# Patient Record
Sex: Male | Born: 1965 | Race: White | Hispanic: No | Marital: Single | Smoking: Former smoker
Health system: Southern US, Community
[De-identification: ages and names within clinical notes are randomized; demographics above are authoritative.]

## PROBLEM LIST (undated history)

## (undated) DIAGNOSIS — I10 Essential (primary) hypertension: Secondary | ICD-10-CM

## (undated) DIAGNOSIS — E119 Type 2 diabetes mellitus without complications: Secondary | ICD-10-CM

## (undated) DIAGNOSIS — E785 Hyperlipidemia, unspecified: Secondary | ICD-10-CM

## (undated) DIAGNOSIS — G4730 Sleep apnea, unspecified: Secondary | ICD-10-CM

## (undated) DIAGNOSIS — Z9889 Other specified postprocedural states: Secondary | ICD-10-CM

## (undated) DIAGNOSIS — R112 Nausea with vomiting, unspecified: Secondary | ICD-10-CM

## (undated) DIAGNOSIS — M4800 Spinal stenosis, site unspecified: Secondary | ICD-10-CM

## (undated) DIAGNOSIS — N289 Disorder of kidney and ureter, unspecified: Secondary | ICD-10-CM

## (undated) DIAGNOSIS — F319 Bipolar disorder, unspecified: Secondary | ICD-10-CM

## (undated) DIAGNOSIS — E279 Disorder of adrenal gland, unspecified: Secondary | ICD-10-CM

## (undated) DIAGNOSIS — T8859XA Other complications of anesthesia, initial encounter: Secondary | ICD-10-CM

## (undated) HISTORY — PX: TONSILLECTOMY: SUR1361

## (undated) HISTORY — DX: Hyperlipidemia, unspecified: E78.5

## (undated) HISTORY — DX: Disorder of kidney and ureter, unspecified: N28.9

## (undated) HISTORY — DX: Sleep apnea, unspecified: G47.30

## (undated) HISTORY — DX: Disorder of adrenal gland, unspecified: E27.9

## (undated) HISTORY — DX: Essential (primary) hypertension: I10

## (undated) HISTORY — DX: Bipolar disorder, unspecified: F31.9

---
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
## 2018-09-28 ENCOUNTER — Other Ambulatory Visit: Payer: Self-pay

## 2018-09-28 ENCOUNTER — Encounter: Payer: Self-pay | Admitting: Pulmonary Disease

## 2018-09-28 ENCOUNTER — Ambulatory Visit (INDEPENDENT_AMBULATORY_CARE_PROVIDER_SITE_OTHER): Payer: Medicare Other | Admitting: Pulmonary Disease

## 2018-09-28 VITALS — BP 146/80 | HR 101 | Ht 72.0 in | Wt 186.6 lb

## 2018-09-28 DIAGNOSIS — G40909 Epilepsy, unspecified, not intractable, without status epilepticus: Secondary | ICD-10-CM

## 2018-09-28 DIAGNOSIS — G4730 Sleep apnea, unspecified: Secondary | ICD-10-CM

## 2018-09-28 NOTE — Progress Notes (Signed)
Subjective:     Patient ID: Edward Garner, male   DOB: 10/27/1965, 53 y.o.   MRN: [id number]  Patient has been evaluated for possible sleep disordered breathing  History of severe snoring, teeth grinding, thrashing around at night  Kicking around and sleep  Noticed witnessed apneas Was born prematurely History of absence seizures, ADHD, diabetes, bipolar disorder  Usually goes to bed between 9 and 10 PM, falls asleep quickly with medications Wakes up about once or twice during the night Final awakening time about 6 AM  Multiple awakenings at night Significant thrashing around at night   Review of Systems  Constitutional: Negative for fever and unexpected weight change.  HENT: Positive for dental problem. Negative for congestion, ear pain, nosebleeds, postnasal drip, rhinorrhea, sinus pressure, sneezing, sore throat and trouble swallowing.   Eyes: Negative for redness and itching.  Respiratory: Negative for cough, chest tightness, shortness of breath and wheezing.   Cardiovascular: Negative for palpitations and leg swelling.  Gastrointestinal: Negative for nausea and vomiting.  Genitourinary: Negative for dysuria.  Musculoskeletal: Positive for joint swelling.  Skin: Negative for rash.  Allergic/Immunologic: Positive for environmental allergies. Negative for food allergies and immunocompromised state.  Neurological: Positive for headaches.  Hematological: Does not bruise/bleed easily.  Psychiatric/Behavioral: Negative for dysphoric mood. The patient is nervous/anxious.    Quit smoking in 1996  Family history of heart disease,  He is disabled  Personal history of hypertension Diabetes Hypercholesterolemia He has a lone kidney Seizure disorder    Objective:   Physical Exam Vitals signs reviewed.  Constitutional:      Appearance: Normal appearance.  Eyes:     Extraocular Movements: Extraocular movements intact.     Pupils: Pupils are equal, round, and reactive to  light.  Neck:     Musculoskeletal: Normal range of motion and neck supple. No neck rigidity.  Cardiovascular:     Rate and Rhythm: Normal rate and regular rhythm.     Heart sounds: No murmur. No friction rub.  Pulmonary:     Effort: Pulmonary effort is normal. No respiratory distress.     Breath sounds: Normal breath sounds. No stridor. No wheezing.  Abdominal:     General: Abdomen is flat.  Musculoskeletal: Normal range of motion.        General: No swelling or tenderness.  Skin:    General: Skin is warm.  Neurological:     Mental Status: He is alert.  Psychiatric:        Mood and Affect: Mood normal.    Results of the Epworth flowsheet 09/28/2018  Sitting and reading 2  Watching TV 1  Sitting, inactive in a public place (e.g. a theatre or a meeting) 1  As a passenger in a car for an hour without a break 2  Lying down to rest in the afternoon when circumstances permit 3  Sitting and talking to someone 0  Sitting quietly after a lunch without alcohol 3  In a car, while stopped for a few minutes in traffic 1  Total score 13      Assessment:     Moderate to high probability of significant sleep disordered breathing -Witnessed apneas -Excessive daytime sleepiness -Heavy snoring  REM behavior disorder -History of nonrestorative sleep -Kicking around a lot at night -Abnormal movements/behavior the patient relates to dreams  History of bipolar disorder/seizure disorder  Bruxism/teeth grinding -Likely related to his untreated obstructive sleep apnea    Plan:     Patient will be optimally evaluated  by an in lab study  Pathophysiology of sleep disordered breathing discussed with patient  Treatment options for sleep disordered breathing discussed  I did discuss the possibility of a home sleep study if patient feels he may not be able to sleep in the lab-this will be less optimal, however, will aid in a diagnosis of sleep disordered breathing-failure rate may be higher  than normal  I will see him back in the office in about 2 months  Encouraged to call with any significant concerns

## 2018-09-28 NOTE — Patient Instructions (Signed)
Moderate probability of significant sleep disordered breathing Possibility of REM behavior disorder Nonrestorative sleep  We will schedule you for an in lab polysomnogram A split-night study will be ordered-treatment on the same night if we get an adequate amount of study time  I will see you back in the office in about 2 to 3 months Sleep Apnea Sleep apnea is a condition in which breathing pauses or becomes shallow during sleep. Episodes of sleep apnea usually last 10 seconds or longer, and they may occur as many as 20 times an hour. Sleep apnea disrupts your sleep and keeps your body from getting the rest that it needs. This condition can increase your risk of certain health problems, including:  Heart attack.  Stroke.  Obesity.  Diabetes.  Heart failure.  Irregular heartbeat. What are the causes? There are three kinds of sleep apnea:  Obstructive sleep apnea. This kind is caused by a blocked or collapsed airway.  Central sleep apnea. This kind happens when the part of the brain that controls breathing does not send the correct signals to the muscles that control breathing.  Mixed sleep apnea. This is a combination of obstructive and central sleep apnea. The most common cause of this condition is a collapsed or blocked airway. An airway can collapse or become blocked if:  Your throat muscles are abnormally relaxed.  Your tongue and tonsils are larger than normal.  You are overweight.  Your airway is smaller than normal. What increases the risk? You are more likely to develop this condition if you:  Are overweight.  Smoke.  Have a smaller than normal airway.  Are elderly.  Are male.  Drink alcohol.  Take sedatives or tranquilizers.  Have a family history of sleep apnea. What are the signs or symptoms? Symptoms of this condition include:  Trouble staying asleep.  Daytime sleepiness and tiredness.  Irritability.  Loud snoring.  Morning headaches.   Trouble concentrating.  Forgetfulness.  Decreased interest in sex.  Unexplained sleepiness.  Mood swings.  Personality changes.  Feelings of depression.  Waking up often during the night to urinate.  Dry mouth.  Sore throat. How is this diagnosed? This condition may be diagnosed with:  A medical history.  A physical exam.  A series of tests that are done while you are sleeping (sleep study). These tests are usually done in a sleep lab, but they may also be done at home. How is this treated? Treatment for this condition aims to restore normal breathing and to ease symptoms during sleep. It may involve managing health issues that can affect breathing, such as high blood pressure or obesity. Treatment may include:  Sleeping on your side.  Using a decongestant if you have nasal congestion.  Avoiding the use of depressants, including alcohol, sedatives, and narcotics.  Losing weight if you are overweight.  Making changes to your diet.  Quitting smoking.  Using a device to open your airway while you sleep, such as: ? An oral appliance. This is a custom-made mouthpiece that shifts your lower jaw forward. ? A continuous positive airway pressure (CPAP) device. This device blows air through a mask when you breathe out (exhale). ? A nasal expiratory positive airway pressure (EPAP) device. This device has valves that you put into each nostril. ? A bi-level positive airway pressure (BPAP) device. This device blows air through a mask when you breathe in (inhale) and breathe out (exhale).  Having surgery if other treatments do not work. During surgery, excess tissue  is removed to create a wider airway. It is important to get treatment for sleep apnea. Without treatment, this condition can lead to:  High blood pressure.  Coronary artery disease.  In men, an inability to achieve or maintain an erection (impotence).  Reduced thinking abilities. Follow these instructions at  home: Lifestyle  Make any lifestyle changes that your health care provider recommends.  Eat a healthy, well-balanced diet.  Take steps to lose weight if you are overweight.  Avoid using depressants, including alcohol, sedatives, and narcotics.  Do not use any products that contain nicotine or tobacco, such as cigarettes, e-cigarettes, and chewing tobacco. If you need help quitting, ask your health care provider. General instructions  Take over-the-counter and prescription medicines only as told by your health care provider.  If you were given a device to open your airway while you sleep, use it only as told by your health care provider.  If you are having surgery, make sure to tell your health care provider you have sleep apnea. You may need to bring your device with you.  Keep all follow-up visits as told by your health care provider. This is important. Contact a health care provider if:  The device that you received to open your airway during sleep is uncomfortable or does not seem to be working.  Your symptoms do not improve.  Your symptoms get worse. Get help right away if:  You develop: ? Chest pain. ? Shortness of breath. ? Discomfort in your back, arms, or stomach.  You have: ? Trouble speaking. ? Weakness on one side of your body. ? Drooping in your face. These symptoms may represent a serious problem that is an emergency. Do not wait to see if the symptoms will go away. Get medical help right away. Call your local emergency services (911 in the [place] Do not drive yourself to the hospital. Summary  Sleep apnea is a condition in which breathing pauses or becomes shallow during sleep.  The most common cause is a collapsed or blocked airway.  The goal of treatment is to restore normal breathing and to ease symptoms during sleep. This information is not intended to replace advice given to you by your health care provider. Make sure you discuss any questions you  have with your health care provider. Document Released: 12/21/2001 Document Revised: 10/17/2017 Document Reviewed: 08/26/2017 Elsevier Patient Education  2020 [employer].

## 2018-09-30 ENCOUNTER — Telehealth: Payer: Self-pay | Admitting: Pulmonary Disease

## 2018-09-30 NOTE — Telephone Encounter (Signed)
Called and spoke with pt mother, Edward Garner (on [place]). [name] states pt needs to have his pre-procedure COVID test scheduled for 10/03/2018 at 1200 rescheduled for a later date. As I researched options, I noticed the day after, 10/04/2018 has no COVID test openings and the latest time available for 10/03/2018 is at 1240. [name] states pt will have to reschedule his split night study for 10/07/2018. I let her know I would get a message routed to our PCCs to help her get pt's sleep study rescheduled. [name] expressed understanding.   PCCs, can you help with this? Pt will need to reschedule his Split Night Study 10/07/2018 because he cannot make it to his pre-procedure COVID test. Thank you.

## 2018-10-01 NOTE — Telephone Encounter (Signed)
I called & provided [name] the phone # to the sleep center to call & reschedule sleep study &/or covid test as they are ones who schedule their covid tests.  Verbalized understanding & nothing further needed at this time.

## 2018-10-03 ENCOUNTER — Other Ambulatory Visit (HOSPITAL_COMMUNITY): Payer: Medicare Other

## 2018-10-06 ENCOUNTER — Other Ambulatory Visit (HOSPITAL_COMMUNITY)
Admission: RE | Admit: 2018-10-06 | Discharge: 2018-10-06 | Disposition: A | Discharge status: Home / Self Care | Payer: Medicare Other | Source: Ambulatory Visit | Attending: Pulmonary Disease | Admitting: Pulmonary Disease

## 2018-10-06 DIAGNOSIS — Z20828 Contact with and (suspected) exposure to other viral communicable diseases: Secondary | ICD-10-CM | POA: Insufficient documentation

## 2018-10-06 DIAGNOSIS — Z01812 Encounter for preprocedural laboratory examination: Secondary | ICD-10-CM | POA: Insufficient documentation

## 2018-10-07 ENCOUNTER — Encounter (HOSPITAL_BASED_OUTPATIENT_CLINIC_OR_DEPARTMENT_OTHER): Payer: Medicare Other | Admitting: Pulmonary Disease

## 2018-10-07 LAB — NOVEL CORONAVIRUS, NAA (HOSP ORDER, SEND-OUT TO REF LAB; TAT 18-24 HRS): SARS-CoV-2, NAA: NOT DETECTED

## 2018-10-09 ENCOUNTER — Encounter (HOSPITAL_BASED_OUTPATIENT_CLINIC_OR_DEPARTMENT_OTHER): Payer: Medicare Other | Admitting: Pulmonary Disease

## 2018-10-14 ENCOUNTER — Other Ambulatory Visit (HOSPITAL_COMMUNITY): Payer: Medicare Other

## 2018-10-16 ENCOUNTER — Other Ambulatory Visit (HOSPITAL_COMMUNITY)
Admission: RE | Admit: 2018-10-16 | Discharge: 2018-10-16 | Disposition: A | Discharge status: Home / Self Care | Payer: Medicare Other | Source: Ambulatory Visit | Attending: Pulmonary Disease | Admitting: Pulmonary Disease

## 2018-10-16 DIAGNOSIS — Z01812 Encounter for preprocedural laboratory examination: Secondary | ICD-10-CM | POA: Insufficient documentation

## 2018-10-16 DIAGNOSIS — Z20828 Contact with and (suspected) exposure to other viral communicable diseases: Secondary | ICD-10-CM | POA: Diagnosis not present

## 2018-10-17 ENCOUNTER — Encounter (HOSPITAL_BASED_OUTPATIENT_CLINIC_OR_DEPARTMENT_OTHER): Payer: Medicare Other | Admitting: Pulmonary Disease

## 2018-10-18 LAB — NOVEL CORONAVIRUS, NAA (HOSP ORDER, SEND-OUT TO REF LAB; TAT 18-24 HRS): SARS-CoV-2, NAA: NOT DETECTED

## 2018-10-19 ENCOUNTER — Encounter (HOSPITAL_BASED_OUTPATIENT_CLINIC_OR_DEPARTMENT_OTHER): Payer: Self-pay | Admitting: Pulmonary Disease

## 2018-10-19 ENCOUNTER — Other Ambulatory Visit: Payer: Self-pay

## 2018-10-19 ENCOUNTER — Ambulatory Visit (HOSPITAL_BASED_OUTPATIENT_CLINIC_OR_DEPARTMENT_OTHER): Payer: Medicare Other | Attending: Pulmonary Disease | Admitting: Pulmonary Disease

## 2018-10-19 DIAGNOSIS — G4733 Obstructive sleep apnea (adult) (pediatric): Secondary | ICD-10-CM | POA: Diagnosis present

## 2018-10-19 DIAGNOSIS — G4730 Sleep apnea, unspecified: Secondary | ICD-10-CM

## 2018-10-19 HISTORY — DX: Sleep apnea, unspecified: G47.30

## 2018-10-25 ENCOUNTER — Telehealth: Payer: Self-pay | Admitting: Pulmonary Disease

## 2018-10-25 DIAGNOSIS — G4733 Obstructive sleep apnea (adult) (pediatric): Secondary | ICD-10-CM

## 2018-10-25 NOTE — Telephone Encounter (Signed)
Sleep study result  Date of study 10/19/2018  Impression: Moderate obstructive sleep apnea, adequately treated with CPAP therapy  Recommendation: Trial of CPAP therapy on 12 cm H2O with a Small size Fisher&Paykel Full Face Mask F&P Vitera (new) mask and heated humidification.  Follow-up in the office 4 to 6 weeks following initiation of CPAP therapy

## 2018-10-25 NOTE — Procedures (Signed)
POLYSOMNOGRAPHY  Last, First: Edward Garner, Edward Garner MRN: [id number] Gender: Male Age (years): 53 Weight (lbs): 180 DOB: [birth year] BMI: 24 Primary Care: No PCP Epworth Score: 12 Referring: Tomma Lightning MD Technician: [place] [employer] Interpreting: Tomma Lightning MD Study Type: Split Night CPAP Ordered Study Type: Split Night CPAP Study date: 10/19/2018 Location: [facility] CLINICAL INFORMATION Edward Garner is a 53 year old Male and was referred to the sleep center for evaluation of G47.30 Sleep Apnea, Unspecified (780.57). Indications include Diabetes, Excessive Daytime Sleepiness, Fatigue, Morning Headaches, Snoring.  MEDICATIONS Patient self administered medications include: DEPAKOTE ER, GLIMEPIRIDE, HYDROCODONE, LYRICA. Medications administered during study include No sleep medicine administered.  SLEEP STUDY TECHNIQUE The patient underwent an attended overnight level one polysomnography titration to assess the effects of CPAP therapy. The following variables were monitored: EEG (C4-A1, C3-A2, O1-A2, O2-A1), EOG, submental and leg EMG, ECG, oxyhemoglobin saturation by pulse oximetry, thoracic and abdominal respiratory effort belts, nasal/oral airflow by pressure sensor, body position sensor and snoring sensor. CPAP pressure was titrated to eliminate apneas, hypopneas and oxygen desaturation. Hypopneas were scored per AASM definition IB (4% desaturation)  The NPSG portion of the study ended at 1:09:32 AM . The CPAP titration was initiated at 1:17:19 AM AM with the CPAP portion of the study ending at 5:16:26 AM.  TECHNICIAN COMMENTS Comments added by Technician: PATIENT WAS ORDERED AS A SPLIT NIGHT STUDY. Patient had more than two awakenings to use the bathroom Comments added by Scorer: N/A SLEEP ARCHITECTURE The recording time for the entire night was 414 minutes. The diagnostic portion was initiated at 10:22:24 PM and terminated at 1:09:32 AM. The time in bed was 167.1 minutes.  EEG confirmed total sleep time was 131.5 minutes yielding a sleep efficiency of 78.7%%. Sleep onset after lights out was 9.4 minutes with a REM latency of N/A minutes. The patient spent 35.4%% of the night in stage N1 sleep, 64.6%% in stage N2 sleep, 0.0%% in stage N3 and 0% in REM. The Arousal Index was 26.0/hour.  The titration portion was initiated at 1:17:19 AM and terminated at 5:16:26 AM. The time in bed was 239.1 minutes. EEG confirmed total sleep time was 117.5 minutes yielding a sleep efficiency of 49.1%%. Sleep onset after CPAP initiation was 27.0 minutes with a REM latency of N/A minutes. The patient spent 12.3%% of the night in stage N1 sleep, 87.7%% in stage N2 sleep, 0.0%% in stage N3 and 0% in REM. The Arousal Index was 12.8/hour. RESPIRATORY PARAMETERS During the diagnostic portion, there were a total of 56 respiratory disturbances recorded; 8 apneas ( 2 obstructive, 2 mixed, 4 central), 34 hypopneas and 14 RERAs. The apnea/hypopnea index 19.2 was events/hour and the RDI was 25.6 events/hour. The central sleep apnea index was 1.8 events/hour. The REM AHI was N/A /h and NREM AHI was 19.2/h. The REM RDI was 0.0 /h and NREM RDI was 25.6 /h. The supine AHI was 19.2/h, and the non supine AHI was 0/h; supine during 100.0%% of sleep. The supine RDI was 25.6/h, and the non supine RDI was N/A/h. Respiratory disturbances were associated with oxygen desaturation down to a nadir of 90.0 % during sleep. The mean oxygen saturation during the study was 95.0%. The cumulative time under 88% oxygen saturation was 0 minutes.  During the titration portion, the apnea/hypopnea index (AHI) was 13.8 events/hour and the RDI was 17.9 events/hour. The central sleep apnea index was events/hour. The most appropriate setting of CPAP was IPAP/EPAP 12/12 cm H2O. At this setting, the sleep  efficiency was 99% and the patient was supine for 100%. The AHI was 2.6 events per hour(with 0 central events). Oxygen nadir was 90.0. LEG  MOVEMENT DATA The periodic limb movement index was 0.0/hour with an associated arousal index of /hour. CARDIAC DATA The underlying cardiac rhythm was most consistent with sinus rhythm. Mean heart rate was 79.8 during diagnostic portion and 78.1 during titration portion of study. Additional rhythm abnormalities include PVCs.   IMPRESSIONS - Moderate Obstructive Sleep apnea(OSA) Optimal pressure attained. - Electrocardiographic data showed presence of PVCs. - No Significant Central Sleep Apnea (CSA) - No significant Oxygen Desaturation - The patient snored with soft snoring volume. - EEG did not show alpha intrusion. - No significant periodic leg movements(PLMs) during sleep, no significant associated arousals. - Reduced sleep efficiency, normal primary sleep latency, no REM sleep latency and no slow wave latency.   DIAGNOSIS - Obstructive Sleep Apnea (327.23 [G47.33 ICD-10])   RECOMMENDATIONS - Trial of CPAP therapy on 12 cm H2O with a Small size Fisher&Paykel Full Face Mask F&P Vitera (new) mask and heated humidification. - Avoid alcohol, sedatives and other CNS depressants that may worsen sleep apnea and disrupt normal sleep architecture. - Sleep hygiene should be reviewed to assess factors that may improve sleep quality. - Weight management and regular exercise should be initiated or continued. - Return to Sleep Center for re-evaluation after 4 weeks of therapy  [Electronically signed] 10/25/2018 10:21 AM  Sherrilyn Rist MD NPI: [id number]

## 2018-10-26 NOTE — Telephone Encounter (Signed)
Call made to patient, spoke with mother Edward Garner (dpr), made aware of results per AO. Okay to go ahead and place order for cpap. I made her aware that we ask that they give [place] about 2 weeks and if they have not heard anything back call us after 2 weeks . Voiced understanding. Nothing further needed at this time.

## 2018-10-28 ENCOUNTER — Ambulatory Visit
Admission: RE | Admit: 2018-10-28 | Discharge: 2018-10-28 | Disposition: A | Discharge status: Home / Self Care | Payer: Medicare Other | Source: Ambulatory Visit | Attending: Neurosurgery | Admitting: Neurosurgery

## 2018-10-28 ENCOUNTER — Other Ambulatory Visit: Payer: Self-pay

## 2018-10-28 ENCOUNTER — Other Ambulatory Visit: Payer: Self-pay | Admitting: Neurosurgery

## 2018-10-28 DIAGNOSIS — M542 Cervicalgia: Secondary | ICD-10-CM

## 2018-10-28 DIAGNOSIS — M47816 Spondylosis without myelopathy or radiculopathy, lumbar region: Secondary | ICD-10-CM

## 2018-11-05 LAB — HEMOGLOBIN A1C: Hemoglobin A1C: 8.8

## 2018-11-05 LAB — LIPID PANEL
Cholesterol: 174 (ref 0–200)
HDL: 46 (ref 35–70)
LDL Cholesterol: 86
Triglycerides: 252 — AB (ref 40–160)

## 2018-11-25 ENCOUNTER — Telehealth: Payer: Self-pay | Admitting: Pulmonary Disease

## 2018-11-25 ENCOUNTER — Other Ambulatory Visit: Payer: Self-pay

## 2018-11-25 ENCOUNTER — Encounter: Payer: Self-pay | Admitting: Pulmonary Disease

## 2018-11-25 ENCOUNTER — Ambulatory Visit (INDEPENDENT_AMBULATORY_CARE_PROVIDER_SITE_OTHER): Payer: Medicare Other | Admitting: Pulmonary Disease

## 2018-11-25 VITALS — BP 142/68 | HR 93 | Ht 72.0 in | Wt 180.4 lb

## 2018-11-25 DIAGNOSIS — Z9989 Dependence on other enabling machines and devices: Secondary | ICD-10-CM

## 2018-11-25 DIAGNOSIS — G4733 Obstructive sleep apnea (adult) (pediatric): Secondary | ICD-10-CM

## 2018-11-25 NOTE — Telephone Encounter (Signed)
Called pt and spoke with his mother. Stated to her that I would place pt's hst results in the mail for pt and she verbalized understanding. Nothing further needed.

## 2018-11-25 NOTE — Progress Notes (Signed)
Subjective:     Patient ID: Edward Garner, male   DOB: Apr 29, 1965, 53 y.o.   MRN: [id number]  Patient with obstructive sleep apnea in for follow-up  Has been trying to get used to using CPAP on a regular basis but having mask issues The initial mass did cause a lot of jaw pain and discomfort He has tried a nasal mask and does have an appointment to follow-up with [facility], they should be able to help him with supplies  History of severe snoring, teeth grinding, thrashing around at night  Kicking around and sleep  Noticed witnessed apneas Was born prematurely History of absence seizures, ADHD, diabetes, bipolar disorder  Usually goes to bed between 9 and 10 PM, falls asleep quickly with medications Wakes up about once or twice during the night Final awakening time about 6 AM  Multiple awakenings at night Significant thrashing around at night   Review of Systems  Constitutional: Negative for fever and unexpected weight change.  HENT: Positive for dental problem. Negative for congestion, ear pain, nosebleeds, postnasal drip, rhinorrhea, sinus pressure, sneezing, sore throat and trouble swallowing.   Eyes: Negative for redness and itching.  Respiratory: Negative for cough, chest tightness, shortness of breath and wheezing.   Cardiovascular: Negative.   Gastrointestinal: Negative.   Genitourinary: Negative.   Musculoskeletal: Positive for joint swelling.  Skin: Negative.   Allergic/Immunologic: Positive for environmental allergies. Negative for food allergies and immunocompromised state.  Neurological: Negative.   Hematological: Negative.   Psychiatric/Behavioral: Negative for dysphoric mood. The patient is nervous/anxious.    Quit smoking in 1996  Family history of heart disease,  He is disabled  Personal history of hypertension Diabetes Hypercholesterolemia He has a lone kidney Seizure disorder    Objective:   Physical Exam Vitals signs reviewed.   Constitutional:      Appearance: Normal appearance.  Eyes:     Extraocular Movements: Extraocular movements intact.     Pupils: Pupils are equal, round, and reactive to light.  Neck:     Musculoskeletal: Normal range of motion and neck supple. No neck rigidity.  Cardiovascular:     Rate and Rhythm: Normal rate and regular rhythm.     Heart sounds: No murmur. No friction rub.  Pulmonary:     Effort: Pulmonary effort is normal. No respiratory distress.     Breath sounds: Normal breath sounds. No stridor. No wheezing.  Abdominal:     General: Abdomen is flat.  Neurological:     Mental Status: He is alert.    Results of the Epworth flowsheet 09/28/2018  Sitting and reading 2  Watching TV 1  Sitting, inactive in a public place (e.g. 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

## 2018-11-25 NOTE — Patient Instructions (Signed)
Moderate obstructive sleep apnea on CPAP  Some difficulty adjusting to CPAP due to mask issues Keep your appointment with [facility]-they should be able to help with a new mask  I will see you back in the office in about 4 to 6 weeks  Call with any significant concerns

## 2018-11-30 ENCOUNTER — Ambulatory Visit: Payer: Medicare Other | Admitting: Pulmonary Disease

## 2019-01-06 ENCOUNTER — Ambulatory Visit: Payer: Medicare Other | Admitting: Pulmonary Disease

## 2019-02-24 ENCOUNTER — Ambulatory Visit (INDEPENDENT_AMBULATORY_CARE_PROVIDER_SITE_OTHER): Payer: Medicare Other | Admitting: Pulmonary Disease

## 2019-02-24 ENCOUNTER — Encounter: Payer: Self-pay | Admitting: Pulmonary Disease

## 2019-02-24 ENCOUNTER — Other Ambulatory Visit: Payer: Self-pay

## 2019-02-24 DIAGNOSIS — G4733 Obstructive sleep apnea (adult) (pediatric): Secondary | ICD-10-CM | POA: Diagnosis not present

## 2019-02-24 DIAGNOSIS — G4700 Insomnia, unspecified: Secondary | ICD-10-CM | POA: Diagnosis not present

## 2019-02-24 DIAGNOSIS — Z9989 Dependence on other enabling machines and devices: Secondary | ICD-10-CM | POA: Diagnosis not present

## 2019-02-24 DIAGNOSIS — G478 Other sleep disorders: Secondary | ICD-10-CM

## 2019-02-24 MED ORDER — ESZOPICLONE 1 MG PO TABS: 2.0000 mg | ORAL_TABLET | Freq: Every evening | ORAL | Status: DC | PRN

## 2019-02-24 NOTE — Patient Instructions (Signed)
Increase CPAP to 13 from 12  Prescription for Lunesta 2 mg nightly  Follow-up in 4 weeks

## 2019-02-24 NOTE — Progress Notes (Signed)
Virtual Visit via Telephone Note  I connected with Edward Garner on 02/24/19 at  4:30 PM EST by telephone and verified that I am speaking with the correct person using two identifiers.  Location: Patient: Edward Garner Provider: Stephannie Peters discussed the limitations, risks, security and privacy concerns of performing an evaluation and management service by telephone and the availability of in person appointments. I also discussed with the patient that there may be a patient responsible charge related to this service. The patient expressed understanding and agreed to proceed.   History of Present Illness: Telephone visit for obstructive sleep apnea  Has been doing relatively well  Having some issues with insomnia  Feels that the pressure may be suboptimal Currently on a pressure of 12   Observations/Objective: Compliance data reveals 100% compliance with a pressure of 12, AHI of 7.8 occasional leak   Assessment and Plan: Obstructive sleep apnea -Increase CPAP to 13 from 12 Sleep maintenance insomnia -Lunesta 2 mg nightly  Follow Up Instructions: Follow-up in about 4 weeks   I discussed the assessment and treatment plan with the patient. The patient was provided an opportunity to ask questions and all were answered. The patient agreed with the plan and demonstrated an understanding of the instructions.   The patient was advised to call back or seek an in-person evaluation if the symptoms worsen or if the condition fails to improve as anticipated.  I provided 15 minutes of non-face-to-face time during this encounter.   Tomma Lightning, MD

## 2019-02-26 ENCOUNTER — Telehealth: Payer: Self-pay | Admitting: Pulmonary Disease

## 2019-02-26 MED ORDER — ESZOPICLONE 2 MG PO TABS: 2.0000 mg | ORAL_TABLET | Freq: Every evening | ORAL | 0 refills | Status: DC | PRN

## 2019-02-26 NOTE — Telephone Encounter (Signed)
Called and spoke with pt's mother [name]. [name] was checking to see if [facility] Rx had been called in to pharmacy for pt after last OV. Looked and saw that it seems like the Lunesta 2mg  was placed as the injection instead of the Rx to be sent to pharmacy.  Beth, please advise if you are okay sending Rx for lunesta to pharmacy for pt. Preferred pharmacy is [facility] off .  Instructions    Return in about 4 weeks (around 03/24/2019). Increase CPAP to 13 from 12  Prescription for Lunesta 2 mg nightly  Follow-up in 4 weeks

## 2019-02-26 NOTE — Telephone Encounter (Signed)
Sent!

## 2019-03-01 ENCOUNTER — Other Ambulatory Visit: Payer: Self-pay

## 2019-03-01 ENCOUNTER — Encounter: Payer: Self-pay | Admitting: "Endocrinology

## 2019-03-01 ENCOUNTER — Ambulatory Visit (INDEPENDENT_AMBULATORY_CARE_PROVIDER_SITE_OTHER): Payer: Medicare Other | Admitting: "Endocrinology

## 2019-03-01 ENCOUNTER — Telehealth: Payer: Self-pay | Admitting: Pulmonary Disease

## 2019-03-01 VITALS — BP 129/88 | HR 99 | Resp 15 | Ht 72.0 in | Wt 180.2 lb

## 2019-03-01 DIAGNOSIS — E1165 Type 2 diabetes mellitus with hyperglycemia: Secondary | ICD-10-CM

## 2019-03-01 DIAGNOSIS — E782 Mixed hyperlipidemia: Secondary | ICD-10-CM

## 2019-03-01 DIAGNOSIS — I10 Essential (primary) hypertension: Secondary | ICD-10-CM

## 2019-03-01 LAB — POCT GLYCOSYLATED HEMOGLOBIN (HGB A1C): Hemoglobin A1C: 7.6 % — AB (ref 4.0–5.6)

## 2019-03-01 MED ORDER — TRULICITY 1.5 MG/0.5ML ~~LOC~~ SOAJ: 1.5000 mg | SUBCUTANEOUS | 2 refills | Status: DC

## 2019-03-01 MED ORDER — GLIPIZIDE ER 5 MG PO TB24: 5.0000 mg | ORAL_TABLET | Freq: Every day | ORAL | 3 refills | Status: DC

## 2019-03-01 NOTE — Progress Notes (Signed)
Endocrinology Consult Note       03/01/2019, 6:23 PM   Subjective:    Patient ID: Edward Garner, male    DOB: 1965/06/24.  Edward Garner is being seen in consultation for management of currently uncontrolled symptomatic diabetes requested by  [name], MD.   Past Medical History:  Diagnosis Date  . 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HCC)   . Bipolar disorder (HCC)   . Hyperlipidemia   . Hypertension   . Kidney disease   . Sleep apnea   . Sleep apnea, unspecified 10/19/2018    History reviewed. No pertinent surgical history.  Social History   Socioeconomic History  . Marital status: Single    Spouse name: Not on file  . Number of children: Not on file  . Years of education: Not on file  . Highest education level: Not on file  Occupational History  . Not on file  Tobacco Use  . Smoking status: Former Smoker    Packs/day: 0.50    Years: 0.50    Pack years: 0.25    Types: Cigarettes    Quit date: 1996    Years since quitting: 25.1  . Smokeless tobacco: Former Neurosurgeon    Types: Snuff  Substance and Sexual Activity  . Alcohol use: Not on file  . Drug use: Not on file  . Sexual activity: Not on file  Other Topics Concern  . Not on file  Social History Narrative  . Not on file   Social Determinants of Health   Financial Resource Strain:   . Difficulty of Paying Living Expenses: Not on file  Food Insecurity:   . Worried About Programme researcher, broadcasting/film/video in the Last Year: Not on file  . Ran Out of Food in the Last Year: Not on file  Transportation Needs:   . Lack of Transportation (Medical): Not on file  . Lack of Transportation (Non-Medical): Not on file  Physical Activity:   . Days of Exercise per Week: Not on file  . Minutes of Exercise per Session: Not on file  Stress:   . Feeling of Stress : Not on file  Social Connections:   . Frequency of Communication with Friends and Family: Not on file  .  Frequency of Social Gatherings with Friends and Family: Not on file  . Attends Religious Services: Not on file  . Active Member of Clubs or Organizations: Not on file  . Attends Banker Meetings: Not on file  . Marital Status: Not on file    Family History  Problem Relation Age of Onset  . Non-Hodgkin's lymphoma Mother   . Diabetes Father   . Cancer Father   . Thyroid disease Father   . Hypertension Father   . Hyperlipidemia Father   . Bipolar disorder Father     Outpatient Encounter Medications as of 03/01/2019  Medication Sig  . ALPRAZolam (XANAX XR) 1 MG 24 hr tablet Take 1 mg by mouth 3 (three) times daily.  [name] amphetamine-dextroamphetamine (ADDERALL XR) 30 MG 24 hr capsule Take 30 mg by mouth daily.  [name] buPROPion (WELLBUTRIN XL) 150 MG 24 hr tablet Take 450  mg by mouth every morning.  . divalproex (DEPAKOTE ER) 500 MG 24 hr tablet Take 500 mg by mouth 3 (three) times daily.  . eszopiclone (LUNESTA) 2 MG TABS tablet Take 1 tablet (2 mg total) by mouth at bedtime as needed for sleep. Take immediately before bedtime  . FLUoxetine (PROZAC) 20 MG capsule Take 20 mg by mouth daily. 3 daily in morning  . HYDROcodone-acetaminophen (NORCO) 10-325 MG tablet Take 1 tablet by mouth 3 (three) times daily as needed. 1 tab TID prn (1/2 tab)  . losartan (COZAAR) 50 MG tablet Take 50 mg by mouth daily.  . metFORMIN (GLUCOPHAGE) 500 MG tablet Take 1,000 mg by mouth 2 (two) times daily after a meal.  . pregabalin (LYRICA) 100 MG capsule Take 100 mg by mouth 3 (three) times daily.  . rosuvastatin (CRESTOR) 20 MG tablet Take 20 mg by mouth daily.  . [DISCONTINUED] Dulaglutide (TRULICITY) 4.5 MG/0.5ML SOPN Inject into the skin once a week.  . [DISCONTINUED] glimepiride (AMARYL) 2 MG tablet Take 2 mg by mouth 2 (two) times daily.  . Dulaglutide (TRULICITY) 1.5 MG/0.5ML SOPN Inject 1.5 mg into the skin once a week.  [name] glipiZIDE (GLUCOTROL XL) 5 MG 24 hr tablet Take 1 tablet (5 mg total) by  mouth daily with breakfast.  . [DISCONTINUED] Valbenazine Tosylate (INGREZZA) 40 MG CAPS Take 40 mg by mouth daily.   No facility-administered encounter medications on file as of 03/01/2019.    ALLERGIES: No Known Allergies  VACCINATION STATUS: Immunization History  Administered Date(s) Administered  . Influenza,inj,Quad PF,6-35 Mos 10/15/2018    Diabetes He presents for his initial diabetic visit. He has type 2 diabetes mellitus. Onset time: He was diagnosed at approximate age of 80 years. His disease course has been fluctuating. There are no hypoglycemic associated symptoms. Pertinent negatives for hypoglycemia include no confusion, headaches, pallor or seizures. There are no diabetic associated symptoms. Pertinent negatives for diabetes include no chest pain, no fatigue, no polydipsia, no polyphagia, no polyuria and no weakness. There are no hypoglycemic complications. Symptoms are improving. Diabetic complications include peripheral neuropathy. Risk factors for coronary artery disease include diabetes mellitus, dyslipidemia, family history, hypertension and male sex. Current diabetic treatments: He is currently on Trulicity 3 mg weekly, glimepiride 2 mg p.o. twice daily, Metformin 2000 mg daily. His weight is fluctuating minimally. He is following a generally unhealthy diet. When asked about meal planning, he reported none. He participates in exercise intermittently. (He did not bring any logs nor meter to review.  His point-of-care A1c today is 7.6%, improving from 8.8%. ) An ACE inhibitor/angiotensin II receptor blocker is being taken. Eye exam is current.  Hyperlipidemia This is a chronic problem. The current episode started more than 1 year ago. The problem is controlled. Exacerbating diseases include diabetes. Pertinent negatives include no chest pain, myalgias or shortness of breath. Current antihyperlipidemic treatment includes statins. Risk factors for coronary artery disease include  diabetes mellitus, dyslipidemia, hypertension and male sex.  Hypertension This is a chronic problem. The current episode started more than 1 year ago. Pertinent negatives include no chest pain, headaches, neck pain, palpitations or shortness of breath. Risk factors for coronary artery disease include diabetes mellitus, dyslipidemia, male gender and family history. Past treatments include angiotensin blockers.     Review of Systems  Constitutional: Negative for chills, fatigue, fever and unexpected weight change.  HENT: Negative for dental problem, mouth sores and trouble swallowing.   Eyes: Negative for visual disturbance.  Respiratory: Negative for  cough, choking, chest tightness, shortness of breath and wheezing.   Cardiovascular: Negative for chest pain, palpitations and leg swelling.  Gastrointestinal: Negative for abdominal distention, abdominal pain, constipation, diarrhea, nausea and vomiting.  Endocrine: Negative for polydipsia, polyphagia and polyuria.  Genitourinary: Negative for dysuria, flank pain, hematuria and urgency.  Musculoskeletal: Negative for back pain, gait problem, myalgias and neck pain.  Skin: Negative for pallor, rash and wound.  Neurological: Negative for seizures, syncope, weakness, numbness and headaches.  Psychiatric/Behavioral: Negative for confusion and dysphoric mood.    Objective:    Vitals with BMI 03/01/2019 11/25/2018 10/19/2018  Height 6\' 0"  6\' 0"  6\' 0"   Weight 180 lbs 3 oz 180 lbs 6 oz 180 lbs  BMI 24.43 24.46 24.41  Systolic 129 142 -  Diastolic 88 68 -  Pulse 99 93 -    BP 129/88   Pulse 99   Resp 15   Ht 6' (1.829 m)   Wt 180 lb 3.2 oz (81.7 kg)   BMI 24.44 kg/m   Wt Readings from Last 3 Encounters:  03/01/19 180 lb 3.2 oz (81.7 kg)  11/25/18 180 lb 6.4 oz (81.8 kg)  10/19/18 180 lb (81.6 kg)     Physical Exam Constitutional:      General: He is not in acute distress.    Appearance: He is well-developed.  HENT:     Head:  Normocephalic and atraumatic.  Neck:     Thyroid: No thyromegaly.     Trachea: No tracheal deviation.  Cardiovascular:     Rate and Rhythm: Normal rate.     Pulses:          Dorsalis pedis pulses are 1+ on the right side and 1+ on the left side.       Posterior tibial pulses are 1+ on the right side and 1+ on the left side.     Heart sounds: Normal heart sounds, S1 normal and S2 normal. No murmur. No gallop.   Pulmonary:     Effort: No respiratory distress.     Breath sounds: Normal breath sounds. No wheezing.  Abdominal:     General: Bowel sounds are normal. There is no distension.     Palpations: Abdomen is soft.     Tenderness: There is no abdominal tenderness. There is no guarding.  Musculoskeletal:     Right shoulder: No swelling or deformity.     Cervical back: Normal range of motion and neck supple.  Skin:    General: Skin is warm and dry.     Findings: No rash.     Nails: There is no clubbing.  Neurological:     Mental Status: He is alert and oriented to person, place, and time.     Cranial Nerves: No cranial nerve deficit.     Sensory: No sensory deficit.     Gait: Gait normal.     Deep Tendon Reflexes: Reflexes are normal and symmetric.  Psychiatric:        Speech: Speech normal.        Behavior: Behavior normal. Behavior is cooperative.        Thought Content: Thought content normal.        Judgment: Judgment normal.     Diabetic Labs (most recent): Lab Results  Component Value Date   HGBA1C 7.6 (A) 03/01/2019   HGBA1C 8.8 11/05/2018    Lipid Panel     Component Value Date/Time   CHOL 174 11/05/2018 0000   TRIG 252 (A) 11/05/2018  0000   HDL 46 11/05/2018 0000   LDLCALC 86 11/05/2018 0000      Assessment & Plan:   1. Uncontrolled type 2 diabetes mellitus with hyperglycemia ([place])   - Edward Garner has currently uncontrolled symptomatic type 2 DM since  54 years of age,  with most recent A1c of 7.6 %. Recent labs reviewed. - I had a long discussion  with him about the progressive nature of diabetes and the pathology behind its complications. -his diabetes is complicated by peripheral neuropathy and he remains at a high risk for more acute and chronic complications which include CAD, CVA, CKD, retinopathy, and neuropathy. These are all discussed in detail with him.  - I have counseled him on diet  and weight management  by adopting a carbohydrate restricted/protein rich diet. Patient is encouraged to switch to  unprocessed or minimally processed     complex starch and increased protein intake (animal or plant source), fruits, and vegetables. -  he is advised to stick to a routine mealtimes to eat 3 meals  a day and avoid unnecessary snacks ( to snack only to correct hypoglycemia).   - he admits that there is a room for improvement in his food and drink choices. - Suggestion is made for him to avoid simple carbohydrates  from his diet including Cakes, Sweet Desserts, Ice Cream, Soda (diet and regular), Sweet Tea, Candies, Chips, Cookies, Store Bought Juices, Alcohol in Excess of  1-2 drinks a day, Artificial Sweeteners,  Coffee Creamer, and "Sugar-free" Products. This will help patient to have more stable blood glucose profile and potentially avoid unintended weight gain.  - he will be scheduled with Jearld Fenton, RDN, CDE for diabetes education.  - I have approached him with the following individualized plan to manage  his diabetes and patient agrees:   -Based on his controlled glycemic profile, he will not need insulin treatment for now.  -He agrees to initiate monitoring with blood glucose twice a day-daily before breakfast and at bedtime.   - he is encouraged to call clinic for blood glucose levels less than 70 or above 200 mg /dl. - he is advised to continue Metformin 1000 mg p.o. twice daily, therapeutically suitable for patient . -I discussed and lowered his Trulicity to 1.5 mg subcutaneously weekly. -I discussed and switched his  sulfonylurea  to glipizide 5 mg XL p.o. daily at breakfast. - Specific targets for  A1c;  LDL, HDL,  and Triglycerides were discussed with the patient.  2) Blood Pressure /Hypertension:  his blood pressure is  controlled to target.   he is advised to continue his current medications including losartan 50 mg p.o. daily with breakfast . 3) Lipids/Hyperlipidemia:   Review of his recent lipid panel showed  controlled  LDL at 88 .  he  is advised to continue    Crestor 20 mg daily at bedtime.  Side effects and precautions discussed with him.  4)  Weight/Diet:  Body mass index is 24.44 kg/m.  -   he is not a candidate for major weight loss. I discussed with him the fact that loss of 5 - 10% of his  current body weight will have the most impact on his diabetes management.  Exercise, and detailed carbohydrates information provided  -  detailed on discharge instructions.  5) Chronic Care/Health Maintenance:  -he  is on ACEI/ARB and Statin medications and  is encouraged to initiate and continue to follow up with Ophthalmology, Dentist,  Podiatrist at least  yearly or according to recommendations, and advised to   stay away from smoking. I have recommended yearly flu vaccine and pneumonia vaccine at least every 5 years; moderate intensity exercise for up to 150 minutes weekly; and  sleep for at least 7 hours a day.  - he is  advised to maintain close follow up with [name], MD for primary care needs, as well as his other providers for optimal and coordinated care.   - Time spent in this patient care: 60 min, of which > 50% was spent in  counseling  him about his currently uncontrolled type 2 diabetes, hyperlipidemia, hypertension and the rest reviewing his blood glucose logs , discussing his hypoglycemia and hyperglycemia episodes, reviewing his current and  previous labs / studies  ( including abstraction from other facilities) and medications  doses and developing a  long term treatment plan based on the  latest standards of care/ guidelines; and documenting his care.    Please refer to Patient Instructions for Blood Glucose Monitoring and Insulin/Medications Dosing Guide"  in media tab for additional information. Please  also refer to " Patient Self Inventory" in the Media  tab for reviewed elements of pertinent patient history.  [name] participated in the discussions, expressed understanding, and voiced agreement with the above plans.  All questions were answered to his satisfaction. he is encouraged to contact clinic should he have any questions or concerns prior to his return visit.   Follow up plan: - Return in about 3 months (around 05/29/2019) for Bring Meter and Logs- A1c in Office, Follow up with Pre-visit Labs.  Marquis Lunch, MD [facility] [address] [place], [place] [address] Phone: [phone]  Fax: [phone]    03/01/2019, 6:23 PM  This note was partially dictated with voice recognition software. Similar sounding words can be transcribed inadequately or may not  be corrected upon review.

## 2019-03-01 NOTE — Patient Instructions (Signed)
                                     Advice for Weight Management  -For most of us the best way to lose weight is by diet management. Generally speaking, diet management means consuming less calories intentionally which over time brings about progressive weight loss.  This can be achieved more effectively by restricting carbohydrate consumption to the minimum possible.  So, it is critically important to know your numbers: how much calorie you are consuming and how much calorie you need. More importantly, our carbohydrates sources should be unprocessed or minimally processed complex starch food items.   Sometimes, it is important to balance nutrition by increasing protein intake (animal or plant source), fruits, and vegetables.  -Sticking to a routine mealtime to eat 3 meals a day and avoiding unnecessary snacks is shown to have a big role in weight control. Under normal circumstances, the only time we lose real weight is when we are hungry, so allow hunger to take place- hunger means no food between meal times, only water.  It is not advisable to starve.   -It is better to avoid simple carbohydrates including: Cakes, Sweet Desserts, Ice Cream, Soda (diet and regular), Sweet Tea, Candies, Chips, Cookies, Store Bought Juices, Alcohol in Excess of  1-2 drinks a day, Artificial Sweeteners, Doughnuts, Coffee Creamers, "Sugar-free" Products, etc, etc.  This is not a complete list.....    -Consulting with certified diabetes educators is proven to provide you with the most accurate and current information on diet.  Also, you may be  interested in discussing diet options/exchanges , we can schedule a visit with Edward Garner, RDN, CDE for individualized nutrition education.  -Exercise: If you are able: 30 -60 minutes a day ,4 days a week, or 150 minutes a week.  The longer the better.  Combine stretch, strength, and aerobic activities.  If you were told in the past that you  have high risk for cardiovascular diseases, you may seek evaluation by your heart doctor prior to initiating moderate to intense exercise programs.                                  Additional Care Considerations for Diabetes   -Diabetes  is a chronic disease.  The most important care consideration is regular follow-up with your diabetes care provider with the goal being avoiding or delaying its complications and to take advantage of advances in medications and technology.    -Type 2 diabetes is known to coexist with other important comorbidities such as high blood pressure and high cholesterol.  It is critical to control not only the diabetes but also the high blood pressure and high cholesterol to minimize and delay the risk of complications including coronary artery disease, stroke, amputations, blindness, etc.    - Studies showed that people with diabetes will benefit from a class of medications known as ACE inhibitors and statins.  Unless there are specific reasons not to be on these medications, the standard of care is to consider getting one from these groups of medications at an optimal doses.  These medications are generally considered safe and proven to help protect the heart and the kidneys.    - People with diabetes are encouraged to initiate and maintain regular follow-up with eye doctors, foot doctors, dentists ,   and if necessary heart and kidney doctors.     - It is highly recommended that people with diabetes quit smoking or stay away from smoking, and get yearly  flu vaccine and pneumonia vaccine at least every 5 years.  One other important lifestyle recommendation is to ensure adequate sleep - at least 6-7 hours of uninterrupted sleep at night.  -Exercise: If you are able: 30 -60 minutes a day, 4 days a week, or 150 minutes a week.  The longer the better.  Combine stretch, strength, and aerobic activities.  If you were told in the past that you have high risk for cardiovascular  diseases, you may seek evaluation by your heart doctor prior to initiating moderate to intense exercise programs.          

## 2019-03-01 NOTE — Telephone Encounter (Signed)
Rx was sent to pharmacy by [facility] 2/12 for pt. Called and spoke with pt's mother Mindi Junker letting her know this had been done and she stated she called the pharmacy and they said they did not have the Rx. I stated to [facility] that I would call the pharmacy and then call her back once I have spoken to them and she verbalized understanding.   Called pt's pharmacy and spoke with pharmacist Sarah to check to see if the Rx was received and she stated that it did not look like the Rx had gone through. Maralyn Sago took a Industrial/product designer Rx of the info from me so she can be able to get it ready for pt.  Called and spoke with [name] letting her know this had been done and she verbalized understanding. Nothing further needed.

## 2019-03-18 ENCOUNTER — Telehealth: Payer: Self-pay | Admitting: "Endocrinology

## 2019-03-18 NOTE — Telephone Encounter (Signed)
Left a message requesting a return call to the office. 

## 2019-03-18 NOTE — Telephone Encounter (Signed)
Patient's mother called and said that they are having a hard time sticking himself to check his blood sugars. They have tried finger, behind the foot. Can you call her and talk with her and give her some suggestions. Her name is Mindi Junker and she takes care of him

## 2019-03-19 NOTE — Telephone Encounter (Signed)
Tried to call pt's mother she was not available at the time of call, discussed alternative sites to test BG with pt.

## 2019-03-22 ENCOUNTER — Telehealth: Payer: Self-pay | Admitting: Pulmonary Disease

## 2019-03-22 DIAGNOSIS — G4733 Obstructive sleep apnea (adult) (pediatric): Secondary | ICD-10-CM

## 2019-03-22 DIAGNOSIS — Z9989 Dependence on other enabling machines and devices: Secondary | ICD-10-CM

## 2019-03-22 NOTE — Telephone Encounter (Signed)
Continued... Pt stated the the 2 mg Edward Garner is working but not long enough, he stated that he has to take it every 4 hours and he was wanting to get a higher dosage.

## 2019-03-22 NOTE — Telephone Encounter (Signed)
Called and spoke with pt who stated he believes his cpap pressure is too high as he has to take the mask off in the middle of the night due to feeling like it is suffocating him. Pt also stated he feels like he needs a higher dose of the lunesta than 2mg  as he states the rx does not work that long. Pt states he has to take it every 4 hours and due to this he is requesting a higher dose.  Dr. , please advise on all this for pt. Download has been printed of pt's cpap data.

## 2019-03-23 ENCOUNTER — Other Ambulatory Visit: Payer: Self-pay | Admitting: Pulmonary Disease

## 2019-03-23 MED ORDER — ESZOPICLONE 3 MG PO TABS: 3.0000 mg | ORAL_TABLET | Freq: Every day | ORAL | 3 refills | Status: DC

## 2019-03-23 NOTE — Telephone Encounter (Signed)
Called and spoke with pt letting him know the info stated by AO and he verbalized understanding. Pt did not have a f/u so I asked if I could schedule him an appt and he stated that I would need to contact his mom to get that arranged. Called [name], pt's mother and scheduled pt a televisit with AO 3/15. Nothing further needed.

## 2019-03-23 NOTE — Telephone Encounter (Signed)
Yes they may be central events, The algorithm in the machine does not have a good way of figuring out whether they are central events  The cause of central events in people on CPAP usually is associated with blowing off too much CO2 which can occur with higher pressures  Reducing the pressures may help  The only way to be sure about central events will be a titration study in the lab, CPAP is still the first-line of treatment even with central sleep apnea, other modes of therapy may be needed if CPAP makes central sleep apnea worse.

## 2019-03-23 NOTE — Telephone Encounter (Signed)
Will increase Lunesta to 3  3 mg is the maximum dose  Lunesta is not to be taken twice at night or every 4 hours  Decrease pressure to 11, follow-up with compliance monitoring in 3 to 4 weeks

## 2019-03-23 NOTE — Telephone Encounter (Signed)
Called spoke with patient and discussed Dr Trena Platt recommendations.  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  Patient is wondering is these events may play a part in his AHI while on CPAP and his sleepiness (pt stated he has a master's in psychology so is "very familiar" with sleep).  Dr Wynona Neat please advise, thank you.

## 2019-03-23 NOTE — Progress Notes (Signed)
Change pressure settings on CPAP to 11  Increase Lunesta dose to 3 mg nightly

## 2019-03-29 ENCOUNTER — Ambulatory Visit (INDEPENDENT_AMBULATORY_CARE_PROVIDER_SITE_OTHER): Payer: Medicare Other | Admitting: Pulmonary Disease

## 2019-03-29 ENCOUNTER — Encounter: Payer: Self-pay | Admitting: Pulmonary Disease

## 2019-03-29 ENCOUNTER — Other Ambulatory Visit: Payer: Self-pay

## 2019-03-29 DIAGNOSIS — Z9989 Dependence on other enabling machines and devices: Secondary | ICD-10-CM

## 2019-03-29 DIAGNOSIS — G4733 Obstructive sleep apnea (adult) (pediatric): Secondary | ICD-10-CM | POA: Diagnosis not present

## 2019-03-29 DIAGNOSIS — G4700 Insomnia, unspecified: Secondary | ICD-10-CM

## 2019-03-29 DIAGNOSIS — G478 Other sleep disorders: Secondary | ICD-10-CM

## 2019-03-29 NOTE — Progress Notes (Signed)
Virtual Visit via Telephone Note  I connected with Edward Garner on 03/29/19 at  9:00 AM EDT by telephone and verified that I am speaking with the correct person using two identifiers.  Location: Patient: Edward Garner Provider: Pulmonary   I discussed the limitations, risks, security and privacy concerns of performing an evaluation and management service by telephone and the availability of in person appointments. I also discussed with the patient that there may be a patient responsible charge related to this service. The patient expressed understanding and agreed to proceed.   History of Present Illness: Follow-up for obstructive sleep apnea  Patient has been using CPAP on a regular basis Describes improvement in symptoms with reducing the pressure from 12-11  He also is benefiting from using Lunesta 3 mg nightly-getting more quality sleep He feels better generally    Observations/Objective: Sounds well on the phone  Compliance data reveals 100% compliance with CPAP residual AHI of 7.5  Assessment and Plan: Obstructive sleep apnea-improved compliance with CPAP therapy Feels better Insomnia -Improved symptoms with Lunesta  Follow Up Instructions: Follow-up in 3 months   I discussed the assessment and treatment plan with the patient. The patient was provided an opportunity to ask questions and all were answered. The patient agreed with the plan and demonstrated an understanding of the instructions.   The patient was advised to call back or seek an in-person evaluation if the symptoms worsen or if the condition fails to improve as anticipated.  I provided 20 minutes of non-face-to-face time during this encounter.   Tomma Lightning, MD

## 2019-04-26 ENCOUNTER — Other Ambulatory Visit: Payer: Self-pay | Admitting: Pulmonary Disease

## 2019-04-26 NOTE — Telephone Encounter (Signed)
ATC patient's mother, unable to reach. Left message to call back. We need clarification on which medication patient needs refilled. There is not Numista on medication list.

## 2019-04-27 NOTE — Telephone Encounter (Signed)
Called and spoke with pt's mother Mindi Junker clarifying the medication that pt was needing a refill of. Pt is needing a refill of eszopiclone 3mg  tablet.  Dr. , please advise if you are okay refilling med.

## 2019-04-28 ENCOUNTER — Other Ambulatory Visit: Payer: Self-pay | Admitting: Pulmonary Disease

## 2019-04-28 MED ORDER — ESZOPICLONE 3 MG PO TABS: 3.0000 mg | ORAL_TABLET | Freq: Every day | ORAL | 3 refills | Status: DC

## 2019-04-28 NOTE — Telephone Encounter (Signed)
PA request was received from (pharmacy):[facility]  Phone:[phone] Fax:  Medication name and strength: Esopiclone 3mg   Ordering Provider: Dr.Olalere  Was PA started with CMM?: yes If yes, please enter KEY: [id number] Medication tried and failed: Xanax 2MG  ,Esopiclone2mg  Covered Alternatives:   PA sent to plan, time frame for approval / denial: Routing to shyerl for follow-up 3 days

## 2019-04-29 NOTE — Telephone Encounter (Signed)
Checked Cover My Meds. PA has been approved through 04/27/2020.

## 2019-04-30 ENCOUNTER — Telehealth: Payer: Self-pay | Admitting: Pulmonary Disease

## 2019-04-30 NOTE — Telephone Encounter (Signed)
Noted. Called [facility] to make them aware of this approval. Nothing further needed.

## 2019-05-21 ENCOUNTER — Other Ambulatory Visit: Payer: Self-pay

## 2019-05-21 MED ORDER — TRULICITY 1.5 MG/0.5ML ~~LOC~~ SOAJ: 1.5000 mg | SUBCUTANEOUS | 1 refills | Status: DC

## 2019-05-26 LAB — ANTI-ISLET CELL ANTIBODY: Islet Cell Ab: NEGATIVE

## 2019-05-26 LAB — COMPREHENSIVE METABOLIC PANEL
ALT: 26 IU/L (ref 0–44)
AST: 20 IU/L (ref 0–40)
Albumin/Globulin Ratio: 2 (ref 1.2–2.2)
Albumin: 4.7 g/dL (ref 3.8–4.9)
Alkaline Phosphatase: 98 IU/L (ref 39–117)
BUN/Creatinine Ratio: 24 — ABNORMAL HIGH (ref 9–20)
BUN: 24 mg/dL (ref 6–24)
Bilirubin Total: <0.2 mg/dL (ref 0.0–1.2)
CO2: 26 mmol/L (ref 20–29)
Calcium: 10.4 mg/dL — ABNORMAL HIGH (ref 8.7–10.2)
Chloride: 100 mmol/L (ref 96–106)
Creatinine, Ser: 1.02 mg/dL (ref 0.76–1.27)
GFR calc Af Amer: 97 mL/min/{1.73_m2} (ref >59)
GFR calc non Af Amer: 84 mL/min/{1.73_m2} (ref >59)
Globulin, Total: 2.4 g/dL (ref 1.5–4.5)
Glucose: 177 mg/dL — ABNORMAL HIGH (ref 65–99)
Potassium: 5.6 mmol/L — ABNORMAL HIGH (ref 3.5–5.2)
Sodium: 141 mmol/L (ref 134–144)
Total Protein: 7.1 g/dL (ref 6.0–8.5)

## 2019-05-26 LAB — GLUTAMIC ACID DECARBOXYLASE AUTO ABS: Glutamic Acid Decarb Ab: <5 U/mL (ref 0.0–5.0)

## 2019-05-26 LAB — VITAMIN D 25 HYDROXY (VIT D DEFICIENCY, FRACTURES): Vit D, 25-Hydroxy: 80.5 ng/mL (ref 30.0–100.0)

## 2019-05-26 LAB — MICROALBUMIN / CREATININE URINE RATIO
Creatinine, Urine: 167.9 mg/dL
Microalb/Creat Ratio: 92 mg/g creat — ABNORMAL HIGH (ref 0–29)
Microalbumin, Urine: 155.3 ug/mL

## 2019-05-26 LAB — T3, FREE: T3, Free: 2.7 pg/mL (ref 2.0–4.4)

## 2019-05-26 LAB — T4, FREE: Free T4: 1.2 ng/dL (ref 0.82–1.77)

## 2019-05-31 ENCOUNTER — Other Ambulatory Visit: Payer: Self-pay

## 2019-05-31 ENCOUNTER — Ambulatory Visit (INDEPENDENT_AMBULATORY_CARE_PROVIDER_SITE_OTHER): Payer: Medicare Other | Admitting: "Endocrinology

## 2019-05-31 ENCOUNTER — Encounter: Payer: Self-pay | Admitting: "Endocrinology

## 2019-05-31 VITALS — BP 114/73 | HR 91 | Ht 72.0 in | Wt 181.2 lb

## 2019-05-31 DIAGNOSIS — E1165 Type 2 diabetes mellitus with hyperglycemia: Secondary | ICD-10-CM | POA: Diagnosis not present

## 2019-05-31 DIAGNOSIS — E782 Mixed hyperlipidemia: Secondary | ICD-10-CM

## 2019-05-31 DIAGNOSIS — I10 Essential (primary) hypertension: Secondary | ICD-10-CM

## 2019-05-31 LAB — POCT GLYCOSYLATED HEMOGLOBIN (HGB A1C): Hemoglobin A1C: 8.9 % — AB (ref 4.0–5.6)

## 2019-05-31 MED ORDER — GLIPIZIDE ER 5 MG PO TB24: 5.0000 mg | ORAL_TABLET | Freq: Every day | ORAL | 1 refills | Status: DC

## 2019-05-31 NOTE — Progress Notes (Signed)
05/31/2019, 5:55 PM  Endocrinology follow-up note   Subjective:    Patient ID: Edward Garner, male    DOB: 11/08/1965.  Edward Garner is being seen in follow-up after he was seen in consultation for management of currently uncontrolled symptomatic diabetes requested by  [name], MD.   Past Medical History:  Diagnosis Date  . 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HCC)   . Bipolar disorder (HCC)   . Hyperlipidemia   . Hypertension   . Kidney disease   . Sleep apnea   . Sleep apnea, unspecified 10/19/2018    History reviewed. No pertinent surgical history.  Social History   Socioeconomic History  . Marital status: Single    Spouse name: Not on file  . Number of children: Not on file  . Years of education: Not on file  . Highest education level: Not on file  Occupational History  . Not on file  Tobacco Use  . Smoking status: Former Smoker    Packs/day: 0.50    Years: 0.50    Pack years: 0.25    Types: Cigarettes    Quit date: 1996    Years since quitting: 25.3  . Smokeless tobacco: Former Neurosurgeon    Types: Snuff  Substance and Sexual Activity  . Alcohol use: Not on file  . Drug use: Not on file  . Sexual activity: Not on file  Other Topics Concern  . Not on file  Social History Narrative  . Not on file   Social Determinants of Health   Financial Resource Strain:   . Difficulty of Paying Living Expenses:   Food Insecurity:   . Worried About Programme researcher, broadcasting/film/video in the Last Year:   . Barista in the Last Year:   Transportation Needs:   . Freight forwarder (Medical):   [name] Lack of Transportation (Non-Medical):   Physical Activity:   . Days of Exercise per Week:   . Minutes of Exercise per Session:   Stress:   . Feeling of Stress :   Social Connections:   . Frequency of Communication with Friends and Family:   . Frequency of Social Gatherings with Friends and Family:   . Attends Religious  Services:   . Active Member of Clubs or Organizations:   . Attends Banker Meetings:   [name] Marital Status:     Family History  Problem Relation Age of Onset  . Non-Hodgkin's lymphoma Mother   . Diabetes Father   . Cancer Father   . Thyroid disease Father   . Hypertension Father   . Hyperlipidemia Father   . Bipolar disorder Father     Outpatient Encounter Medications as of 05/31/2019  Medication Sig  . ALPRAZolam (XANAX XR) 1 MG 24 hr tablet Take 1 mg by mouth 3 (three) times daily.  [name] amphetamine-dextroamphetamine (ADDERALL XR) 30 MG 24 hr capsule Take 30 mg by mouth daily.  [name] buPROPion (WELLBUTRIN XL) 150 MG 24 hr tablet Take 450 mg by mouth every morning.  . divalproex (DEPAKOTE ER) 500 MG 24 hr tablet Take 500 mg by mouth 3 (three) times daily. Takes at night  . Dulaglutide (TRULICITY) 1.5 MG/0.5ML SOPN Inject 1.5 mg into the  skin once a week.  . Eszopiclone 3 MG TABS Take 1 tablet (3 mg total) by mouth at bedtime. Take immediately before bedtime  . FLUoxetine (PROZAC) 20 MG capsule Take 20 mg by mouth daily. 3 daily in morning  . glipiZIDE (GLUCOTROL XL) 10 MG 24 hr tablet Take 5 mg by mouth daily with breakfast.  . HYDROcodone-acetaminophen (NORCO) 10-325 MG tablet Take 1 tablet by mouth 3 (three) times daily as needed. 1 tab QID prn (1/2 tab)  . losartan (COZAAR) 50 MG tablet Take 50 mg by mouth daily.  . metFORMIN (GLUCOPHAGE) 500 MG tablet Take 1,000 mg by mouth 2 (two) times daily after a meal.  . pregabalin (LYRICA) 100 MG capsule Take 100 mg by mouth 3 (three) times daily.  . rosuvastatin (CRESTOR) 20 MG tablet Take 20 mg by mouth daily.  . [DISCONTINUED] glipiZIDE (GLUCOTROL XL) 5 MG 24 hr tablet Take 1 tablet (5 mg total) by mouth daily with breakfast.   No facility-administered encounter medications on file as of 05/31/2019.    ALLERGIES: No Known Allergies  VACCINATION STATUS: Immunization History  Administered Date(s) Administered  .  Influenza,inj,Quad PF,6-35 Mos 10/15/2018    Diabetes He presents for his follow-up diabetic visit. He has type 2 diabetes mellitus. Onset time: He was diagnosed at approximate age of 81 years. His disease course has been worsening. There are no hypoglycemic associated symptoms. Pertinent negatives for hypoglycemia include no confusion, headaches, pallor or seizures. Pertinent negatives for diabetes include no chest pain, no fatigue, no polydipsia, no polyphagia, no polyuria and no weakness. There are no hypoglycemic complications. Symptoms are worsening. Diabetic complications include peripheral neuropathy. Risk factors for coronary artery disease include diabetes mellitus, dyslipidemia, family history, hypertension and male sex. Current diabetic treatments: He is currently on Trulicity 3 mg weekly, glimepiride 2 mg p.o. twice daily, Metformin 2000 mg daily. His weight is stable. He is following a generally unhealthy diet. When asked about meal planning, he reported none. He participates in exercise intermittently. His home blood glucose trend is fluctuating dramatically. His breakfast blood glucose range is generally 140-180 mg/dl. His bedtime blood glucose range is generally 180-200 mg/dl. (Due to exposure to steroids in the interval, she tried a course of hypoglycemia which led to higher A1c of 8.9% increasing from his last A1c was 7.6%.  More recently in the last 10 days, his glycemic profile is near target. ) An ACE inhibitor/angiotensin II receptor blocker is being taken. Eye exam is current.  Hyperlipidemia This is a chronic problem. The current episode started more than 1 year ago. The problem is controlled. Exacerbating diseases include diabetes. Pertinent negatives include no chest pain, myalgias or shortness of breath. Current antihyperlipidemic treatment includes statins. Risk factors for coronary artery disease include diabetes mellitus, dyslipidemia, hypertension and male sex.   Hypertension This is a chronic problem. The current episode started more than 1 year ago. Pertinent negatives include no chest pain, headaches, neck pain, palpitations or shortness of breath. Risk factors for coronary artery disease include diabetes mellitus, dyslipidemia, male gender and family history. Past treatments include angiotensin blockers.     Review of Systems  Constitutional: Negative for chills, fatigue, fever and unexpected weight change.  HENT: Negative for dental problem, mouth sores and trouble swallowing.   Eyes: Negative for visual disturbance.  Respiratory: Negative for cough, choking, chest tightness, shortness of breath and wheezing.   Cardiovascular: Negative for chest pain, palpitations and leg swelling.  Gastrointestinal: Negative for abdominal distention, abdominal pain, constipation,  diarrhea, nausea and vomiting.  Endocrine: Negative for polydipsia, polyphagia and polyuria.  Genitourinary: Negative for dysuria, flank pain, hematuria and urgency.  Musculoskeletal: Negative for back pain, gait problem, myalgias and neck pain.  Skin: Negative for pallor, rash and wound.  Neurological: Negative for seizures, syncope, weakness, numbness and headaches.  Psychiatric/Behavioral: Negative for confusion and dysphoric mood.    Objective:    Vitals with BMI 05/31/2019 03/01/2019 11/25/2018  Height 6\' 0"  6\' 0"  6\' 0"   Weight 181 lbs 3 oz 180 lbs 3 oz 180 lbs 6 oz  BMI 24.57 24.43 24.46  Systolic 114 129 259142  Diastolic 73 88 68  Pulse 91 99 93    BP 114/73   Pulse 91   Ht 6' (1.829 m)   Wt 181 lb 3.2 oz (82.2 kg)   BMI 24.58 kg/m   Wt Readings from Last 3 Encounters:  05/31/19 181 lb 3.2 oz (82.2 kg)  03/01/19 180 lb 3.2 oz (81.7 kg)  11/25/18 180 lb 6.4 oz (81.8 kg)     Physical Exam Constitutional:      General: He is not in acute distress.    Appearance: He is well-developed.  HENT:     Head: Normocephalic and atraumatic.  Neck:     Thyroid: No  thyromegaly.     Trachea: No tracheal deviation.  Cardiovascular:     Rate and Rhythm: Normal rate.     Pulses:          Dorsalis pedis pulses are 1+ on the right side and 1+ on the left side.       Posterior tibial pulses are 1+ on the right side and 1+ on the left side.     Heart sounds: Normal heart sounds, S1 normal and S2 normal. No murmur. No gallop.   Pulmonary:     Effort: No respiratory distress.     Breath sounds: Normal breath sounds. No wheezing.  Abdominal:     General: Bowel sounds are normal. There is no distension.     Palpations: Abdomen is soft.     Tenderness: There is no abdominal tenderness. There is no guarding.  Musculoskeletal:     Right shoulder: No swelling or deformity.     Cervical back: Normal range of motion and neck supple.  Skin:    General: Skin is warm and dry.     Findings: No rash.     Nails: There is no clubbing.  Neurological:     Mental Status: He is alert and oriented to person, place, and time.     Cranial Nerves: No cranial nerve deficit.     Sensory: No sensory deficit.     Gait: Gait normal.     Deep Tendon Reflexes: Reflexes are normal and symmetric.  Psychiatric:        Speech: Speech normal.        Behavior: Behavior normal. Behavior is cooperative.        Thought Content: Thought content normal.        Judgment: Judgment normal.     Diabetic Labs (most recent): Lab Results  Component Value Date   HGBA1C 8.9 (A) 05/31/2019   HGBA1C 7.6 (A) 03/01/2019   HGBA1C 8.8 11/05/2018    Lipid Panel     Component Value Date/Time   CHOL 174 11/05/2018 0000   TRIG 252 (A) 11/05/2018 0000   HDL 46 11/05/2018 0000   LDLCALC 86 11/05/2018 0000      Assessment & Plan:  1. Uncontrolled type 2 diabetes mellitus with hyperglycemia (HCC)   - Chevis Weisensel has currently uncontrolled symptomatic type 2 DM since  54 years of age.  Due to exposure to steroids in the interval, she tried a course of hypoglycemia which led to higher A1c  of 8.9% increasing from his last A1c was 7.6%.  More recently in the last 10 days, his glycemic profile is near target. -His recent labs are discussed with him.  He is antipancreatic antibodies are negative, indicating his diabetes most likely type 2.  - I had a long discussion with him about the progressive nature of diabetes and the pathology behind its complications. -his diabetes is complicated by peripheral neuropathy and he remains at a high risk for more acute and chronic complications which include CAD, CVA, CKD, retinopathy, and neuropathy. These are all discussed in detail with him.  - I have counseled him on diet  management  by adopting a carbohydrate restricted/protein rich diet. Patient is encouraged to switch to  unprocessed or minimally processed     complex starch and increased protein intake (animal or plant source), fruits, and vegetables. -  he is advised to stick to a routine mealtimes to eat 3 meals  a day and avoid unnecessary snacks ( to snack only to correct hypoglycemia).   -  Suggestion is made for him to avoid simple carbohydrates  from his diet including Cakes, Sweet Desserts / Pastries, Ice Cream, Soda (diet and regular), Sweet Tea, Candies, Chips, Cookies, Sweet Pastries,  Store Bought Juices, Alcohol in Excess of  1-2 drinks a day, Artificial Sweeteners, Coffee Creamer, and "Sugar-free" Products. This will help patient to have stable blood glucose profile and potentially avoid unintended weight gain.   - I have approached him with the following individualized plan to manage  his diabetes and patient agrees:   -He agrees to continue to monitor blood glucose twice a day-daily before breakfast and at bedtime.  - he is encouraged to call clinic for blood glucose levels less than 70 or above 200 mg /dl. - he is advised to continue Metformin 1000 mg p.o. twice daily, therapeutically suitable for patient . -He is advised to continue Trulicity 1.5 mg subcutaneously weekly.    -He is advised to continue glipizide XL at 5 mg daily at breakfast.   -If his A1c increases to about 9%, he will be considered for basal insulin on next visit.  - Specific targets for  A1c;  LDL, HDL,  and Triglycerides were discussed with the patient.  2) Blood Pressure /Hypertension:  his blood pressure is controlled to target.    he is advised to continue his current medications including losartan 50 mg p.o. daily with breakfast .  3) Lipids/Hyperlipidemia:   Review of his recent lipid panel showed  controlled  LDL at 88 .  he  is advised to continue Crestor 20 mg p.o. daily at bedtime.  Side effects and precautions discussed with him.   4)  Hypercalcemia/hyperkalemia: First noticed minor electrolyte abnormalities.  He is advised on decrease consumption on potassium rich food items such as oranges, bananas, tomatoes. His next blood work will include PTH/calcium, magnesium, phosphorus.  5)  Weight/Diet:  Body mass index is 24.58 kg/m.  -   he is not a candidate for major weight loss. I discussed with him the fact that loss of 5 - 10% of his  current body weight will have the most impact on his diabetes management.  Exercise, and detailed carbohydrates  information provided  -  detailed on discharge instructions.  5) Chronic Care/Health Maintenance:  -he  is on ACEI/ARB and Statin medications and  is encouraged to initiate and continue to follow up with Ophthalmology, Dentist,  Podiatrist at least yearly or according to recommendations, and advised to   stay away from smoking. I have recommended yearly flu vaccine and pneumonia vaccine at least every 5 years; moderate intensity exercise for up to 150 minutes weekly; and  sleep for at least 7 hours a day.  - he is  advised to maintain close follow up with [name], MD for primary care needs, as well as his other providers for optimal and coordinated care.   - Time spent on this patient care encounter:  35 min, of which > 50% was spent in   counseling and the rest reviewing his blood glucose logs , discussing his hypoglycemia and hyperglycemia episodes, reviewing his current and  previous labs / studies  ( including abstraction from other facilities) and medications  doses and developing a  long term treatment plan and documenting his care.   Please refer to Patient Instructions for Blood Glucose Monitoring and Insulin/Medications Dosing Guide"  in media tab for additional information. Please  also refer to " Patient Self Inventory" in the Media  tab for reviewed elements of pertinent patient history.  [name] participated in the discussions, expressed understanding, and voiced agreement with the above plans.  All questions were answered to his satisfaction. he is encouraged to contact clinic should he have any questions or concerns prior to his return visit.   Follow up plan: - Return in about 3 months (around 08/31/2019) for Meter, Logs, A1c here., F/U with Pre-visit Labs, Meter, Logs, A1c here.Marquis Lunch, MD [facility] [address] [place], [place] [address] Phone: [phone]  Fax: [phone]    05/31/2019, 5:55 PM  This note was partially dictated with voice recognition software. Similar sounding words can be transcribed inadequately or may not  be corrected upon review.

## 2019-07-14 ENCOUNTER — Other Ambulatory Visit: Payer: Self-pay | Admitting: "Endocrinology

## 2019-07-26 ENCOUNTER — Telehealth: Payer: Self-pay | Admitting: Pulmonary Disease

## 2019-07-26 NOTE — Telephone Encounter (Signed)
Called and spoke with pt's mother Mindi Junker who stated due to pt being on a lot of meds, pt wants to decrease the Eszopiclone from 3mg  down to 2mg .  Dr. , please advise.

## 2019-07-27 ENCOUNTER — Other Ambulatory Visit: Payer: Self-pay | Admitting: Pulmonary Disease

## 2019-07-27 MED ORDER — ESZOPICLONE 2 MG PO TABS: 2.0000 mg | ORAL_TABLET | Freq: Every evening | ORAL | 1 refills | Status: DC | PRN

## 2019-07-27 NOTE — Telephone Encounter (Signed)
Spoke with pt, advised message from Dr. Wynona Neat. AO can yo send in the 2mg  to his pharmacy?

## 2019-07-27 NOTE — Telephone Encounter (Signed)
Okay to decrease eszopiclone from 3 mg to 2 mg

## 2019-07-27 NOTE — Telephone Encounter (Signed)
Called and spoke with [name] letting her know that AO sent Rx for lunesta 2mg  to pharmacy for pt. She verbalized understanding.  Nothing further needed.

## 2019-07-27 NOTE — Telephone Encounter (Signed)
Lunesta 2 mg sent in

## 2019-09-02 ENCOUNTER — Ambulatory Visit: Payer: Medicare Other | Admitting: "Endocrinology

## 2019-09-30 ENCOUNTER — Other Ambulatory Visit: Payer: Self-pay | Admitting: Pulmonary Disease

## 2019-09-30 NOTE — Telephone Encounter (Signed)
AO  Pt is requesting a refill of the lunesta    This was refilled on 07/27/19 for #30 with 1 refill.  Please advise. Thanks   Last OV   03/29/2019 Next ov     No pending appts

## 2019-12-01 ENCOUNTER — Telehealth: Payer: Self-pay | Admitting: Pulmonary Disease

## 2019-12-01 NOTE — Telephone Encounter (Signed)
Called and spoke with patient's mother, Edward Garner, listed on DPR, she advised that the pharmacy that the patient usually uses, [facility] [address] their computer is down and they are also unable to transfer to another pharmacy.  For now he needs his prescription for Eszopliclone 2mg  to be sent to [facility] on [address].  Dr. Th[place], please advise.  Thank you.

## 2019-12-03 ENCOUNTER — Other Ambulatory Visit: Payer: Self-pay | Admitting: Pulmonary Disease

## 2019-12-03 MED ORDER — ESZOPICLONE 2 MG PO TABS: ORAL_TABLET | ORAL | 1 refills | Status: DC

## 2019-12-03 NOTE — Telephone Encounter (Signed)
Dr. Val Eagle please advise if ok to send.  rx has been pended in this encounter note for you to sign if ok.  Thanks!

## 2019-12-03 NOTE — Telephone Encounter (Signed)
Signed order for [facility]

## 2019-12-06 NOTE — Telephone Encounter (Signed)
Called and spoke to [place]. She states they received the [facility] script and it has been picked up. Nothing further needed at this time. Will sign off.

## 2019-12-20 ENCOUNTER — Other Ambulatory Visit: Payer: Self-pay | Admitting: Pulmonary Disease

## 2020-03-08 ENCOUNTER — Other Ambulatory Visit: Payer: Self-pay | Admitting: Pulmonary Disease

## 2020-03-08 NOTE — Telephone Encounter (Signed)
Okay to reduce dose of Lunesta to 1 mg and it is okay to stop whenever he chooses to stop

## 2020-03-08 NOTE — Telephone Encounter (Signed)
Call returned to patient mother, confirmed DOB. Made aware decreased dose is okay with AO. Patient mother states she will half the last remaining pills the patient has but it not last through the weekend. Made aware I would have AO refill. Voiced understanding.   AO medication pended, please send in. Thanks :)

## 2020-03-08 NOTE — Telephone Encounter (Signed)
Call returned to patient, spoke with his mother, confirmed patient DOB. They are requesting to decrease his lunesta dose to 1mg . I inquired as to whether the patient was having any symptoms that made them want to decrease. Patient mother states he used it to get use to his cpap and his goal is to eventually wean himself off of the . Made aware I would get this information to AO. Voiced understanding.   AO please advise if we can reduce lunesta to 1mg  po QHS. Thanks :)

## 2020-03-15 NOTE — Telephone Encounter (Signed)
LMTCB with patient.   Call made to pharmacy, placed on hold for extended amount of time.   Will try back.

## 2020-03-17 NOTE — Telephone Encounter (Signed)
Spoke with the pt's mother  AO sent in the rx on 2.23.22 for the 2 mg lunesta  Too soon to send in 1 mg rx at this time  Pt will keep appt on 03/28/20 and will discuss with MD further  Nothing further needed at this time

## 2020-03-28 ENCOUNTER — Encounter: Payer: Self-pay | Admitting: Pulmonary Disease

## 2020-03-28 ENCOUNTER — Other Ambulatory Visit: Payer: Self-pay

## 2020-03-28 ENCOUNTER — Ambulatory Visit (INDEPENDENT_AMBULATORY_CARE_PROVIDER_SITE_OTHER): Payer: Medicare Other | Admitting: Pulmonary Disease

## 2020-03-28 VITALS — BP 118/74 | HR 93 | Temp 97.0°F | Ht 72.0 in | Wt 173.8 lb

## 2020-03-28 DIAGNOSIS — Z9989 Dependence on other enabling machines and devices: Secondary | ICD-10-CM | POA: Diagnosis not present

## 2020-03-28 DIAGNOSIS — G4733 Obstructive sleep apnea (adult) (pediatric): Secondary | ICD-10-CM

## 2020-03-28 NOTE — Patient Instructions (Signed)
Continue with CPAP as tolerated  Try and put it on every night if you can, leave it off when she wake up if he feels he can go back to sleep with putting it on  Continue with your other medications  Exercise as tolerated  Follow-up in 6 months

## 2020-03-28 NOTE — Progress Notes (Signed)
Subjective:     Patient ID: Edward Garner, male   DOB: Aug 31, 1965, 55 y.o.   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  HENT: Positive for dental problem. Negative for congestion, ear pain, nosebleeds, postnasal drip, rhinorrhea, sinus pressure, sneezing, sore throat and trouble swallowing.   Eyes: Negative for redness and itching.  Respiratory: Negative for cough, chest tightness, shortness of breath and wheezing.   Cardiovascular: Negative.   Gastrointestinal: Negative.   Genitourinary: Negative.   Musculoskeletal: Positive for joint swelling.  Skin: Negative.   Allergic/Immunologic: Positive for environmental allergies. Negative for food allergies and immunocompromised state.  Neurological: Negative.   Hematological: Negative.   Psychiatric/Behavioral: Negative for dysphoric mood. The patient is nervous/anxious.    Quit smoking in 1996  Family history of heart disease,  He is disabled  Personal history of  hypertension Diabetes Hypercholesterolemia He has a lone kidney Seizure disorder    Objective:   Physical Exam Vitals reviewed.  Constitutional:      Appearance: Normal appearance.  HENT:     Mouth/Throat:     Mouth: Mucous membranes are moist.  Eyes:     Extraocular Movements: Extraocular movements intact.     Pupils: Pupils are equal, round, and reactive to light.  Cardiovascular:     Rate and Rhythm: Normal rate and regular rhythm.     Heart sounds: No murmur heard. No friction rub.  Pulmonary:     Effort: Pulmonary effort is normal. No respiratory distress.     Breath sounds: Normal breath sounds. No stridor. No wheezing.  Abdominal:     General: Abdomen is flat.  Musculoskeletal:     Cervical back: No rigidity.  Neurological:     Mental Status: He is alert.  Psychiatric:        Mood and Affect: Mood normal.    Results of the Epworth flowsheet 09/28/2018  Sitting and reading 2  Watching TV 1  Sitting, inactive in a public place (e.g. a theatre or a meeting) 1  As a passenger in a car for an hour without a break 2  Lying down to rest in the afternoon when circumstances permit 3  Sitting and talking to someone 0  Sitting quietly after a lunch without alcohol 3  In a car, while stopped for a few minutes in traffic 1  Total score 13      Assessment:     Moderate obstructive sleep apnea -Trying to get used to CPAP use -Continues to have mask issues -He does try to put the mask on every night but sometimes  not able to sleep through the night with the mask on  REM behavior disorder -History of nonrestorative sleep -Kicking around a lot at night -Appears to get a good amount of sleep at night -Has not been a danger to himself or others  History of bipolar disorder/seizure disorder  History of diabetes  Bruxism/teeth grinding -Likely related to his untreated obstructive sleep apnea    CPAP compliance continues to be challenging with 72% use, average use of  2 hours 54 minutes CPAP is set at 11 AHI of 8.1 with some issues with mask leak  Plan:     Continue CPAP use  Continue to attempt to put CPAP on a nightly basis  He does appear that he is trying to use the CPAP and has been given upon using CPAP on a regular basis but sometimes still having significant issues with the mask and been able to stay calm enough to tolerate the mask  Encouraged to continue using it on a nightly basis as tolerated  He does state that he does feel better he was able to get at least 4 hours of sleep with the mask on   Encouraged to call with any significant concerns  Follow-up in 6 months

## 2020-05-19 ENCOUNTER — Telehealth: Payer: Self-pay | Admitting: Pulmonary Disease

## 2020-05-19 NOTE — Telephone Encounter (Signed)
Patient stated insurance does not cover Lunesta, but insurance will cover Ambien.  Patient request prescription to be sent to [facility].   LOV 03/28/20- Dr. Wynona Neat  Instructions  Continue with CPAP as tolerated  Try and put it on every night if you can, leave it off when she wake up if he feels he can go back to sleep with putting it on  Continue with your other medications  Exercise as tolerated  Follow-up in 6 months     Message routed to Dr. Wynona Neat to advise

## 2020-05-21 ENCOUNTER — Other Ambulatory Visit: Payer: Self-pay | Admitting: Pulmonary Disease

## 2020-05-21 MED ORDER — ZOLPIDEM TARTRATE 10 MG PO TABS
10.0000 mg | ORAL_TABLET | Freq: Every day | ORAL | 2 refills | Status: DC
Start: 2020-05-21 — End: 2020-08-23

## 2020-05-21 NOTE — Telephone Encounter (Signed)
Ambien prescription sent in 

## 2020-05-22 NOTE — Telephone Encounter (Signed)
Called and spoke with Patient.  Patient aware Dr. Wynona Neat has sent Ambien prescription to pharmacy.   Nothing further at this time.

## 2020-07-12 IMAGING — CR DG LUMBAR SPINE COMPLETE 4+V
4 series · 4 of 4 positions shown · non-contrast
Comparison: None.

CLINICAL DATA: Low back pain.  No known injury.

EXAM:
LUMBAR SPINE - COMPLETE 4+ VIEW

[w lumbar spine ap]
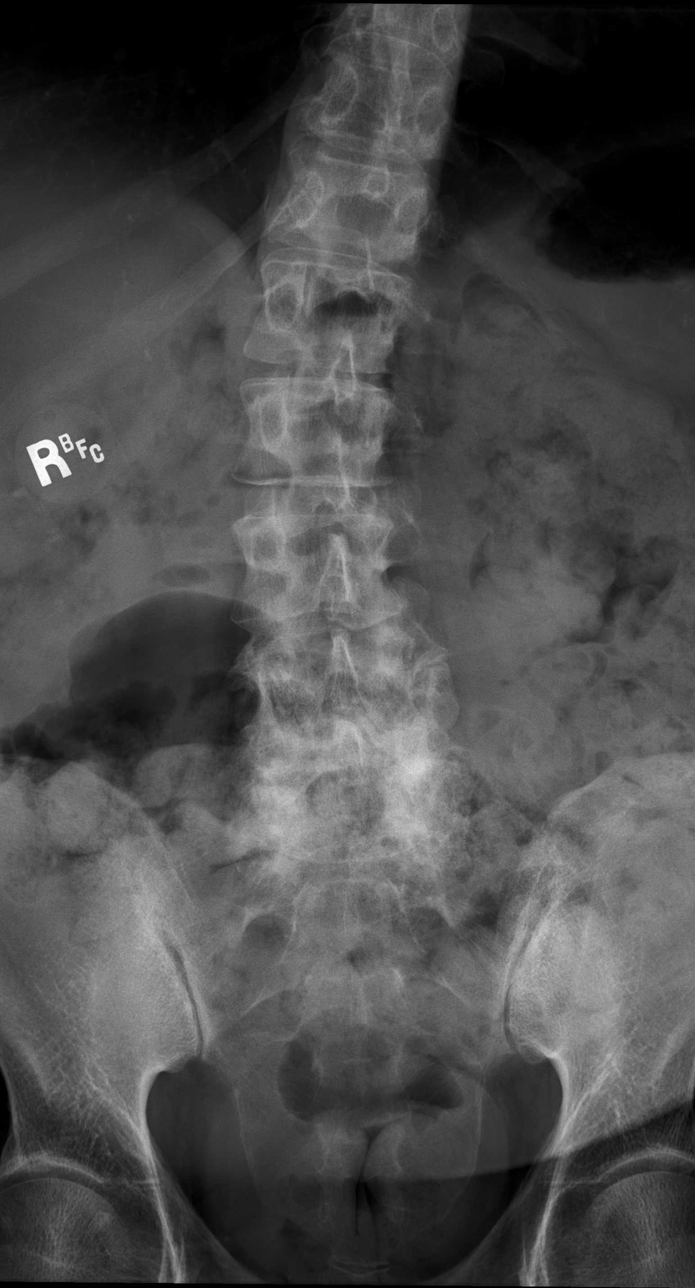

[w lumbar spine lat (1 of 3)]
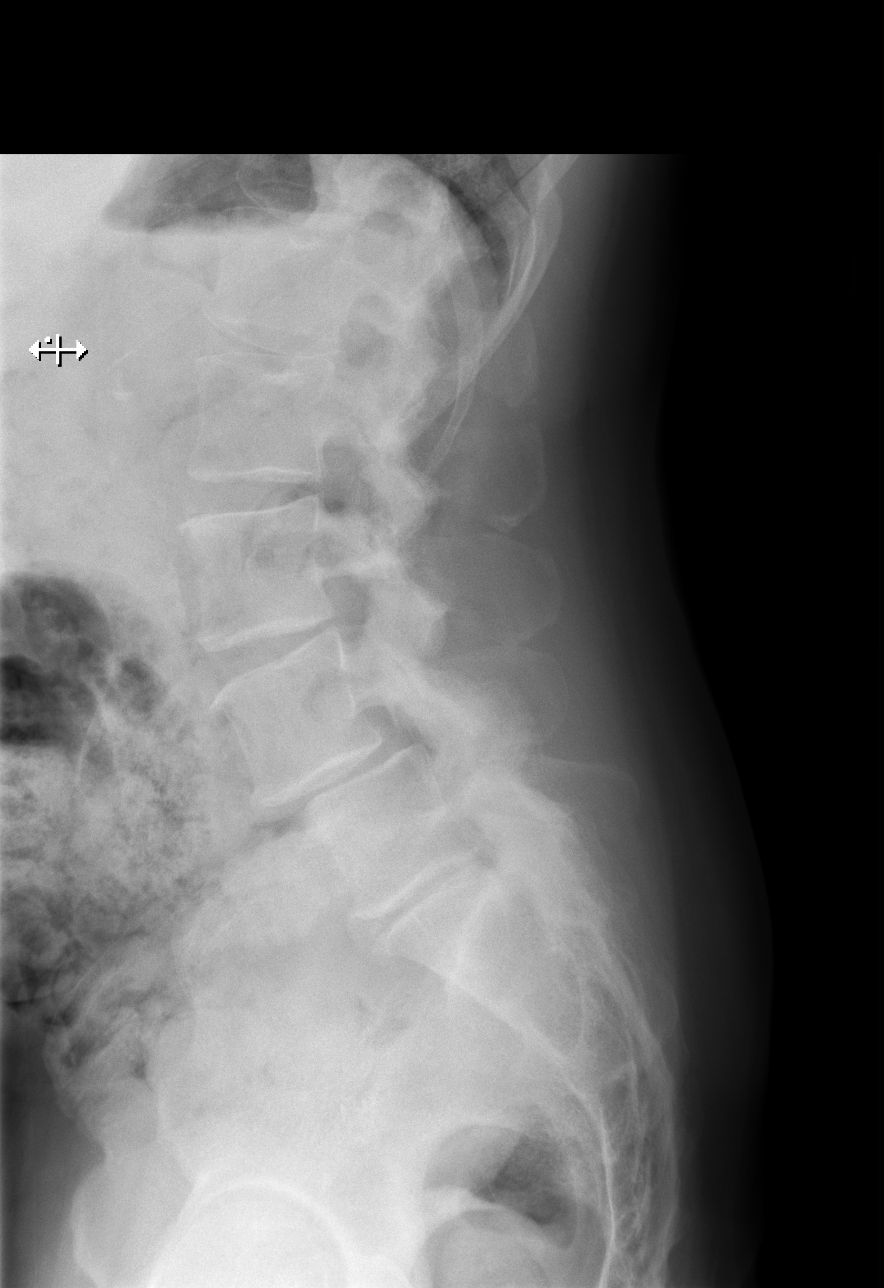

[w lumbar spine lat (2 of 3)]
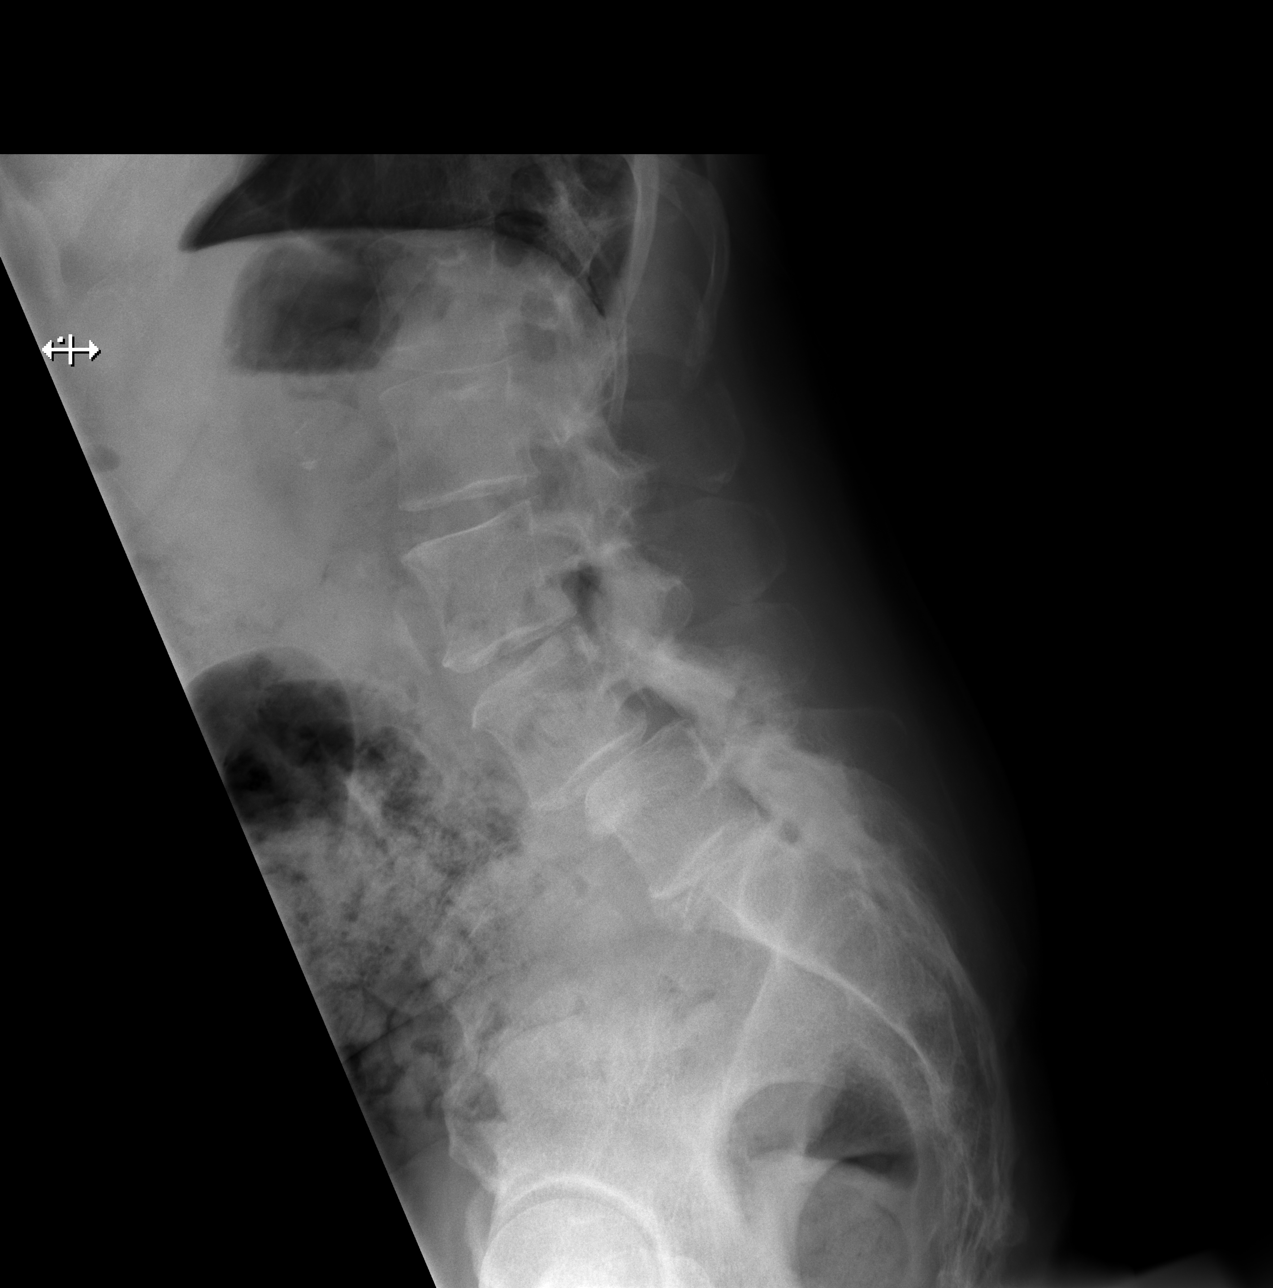

[w lumbar spine lat (3 of 3)]
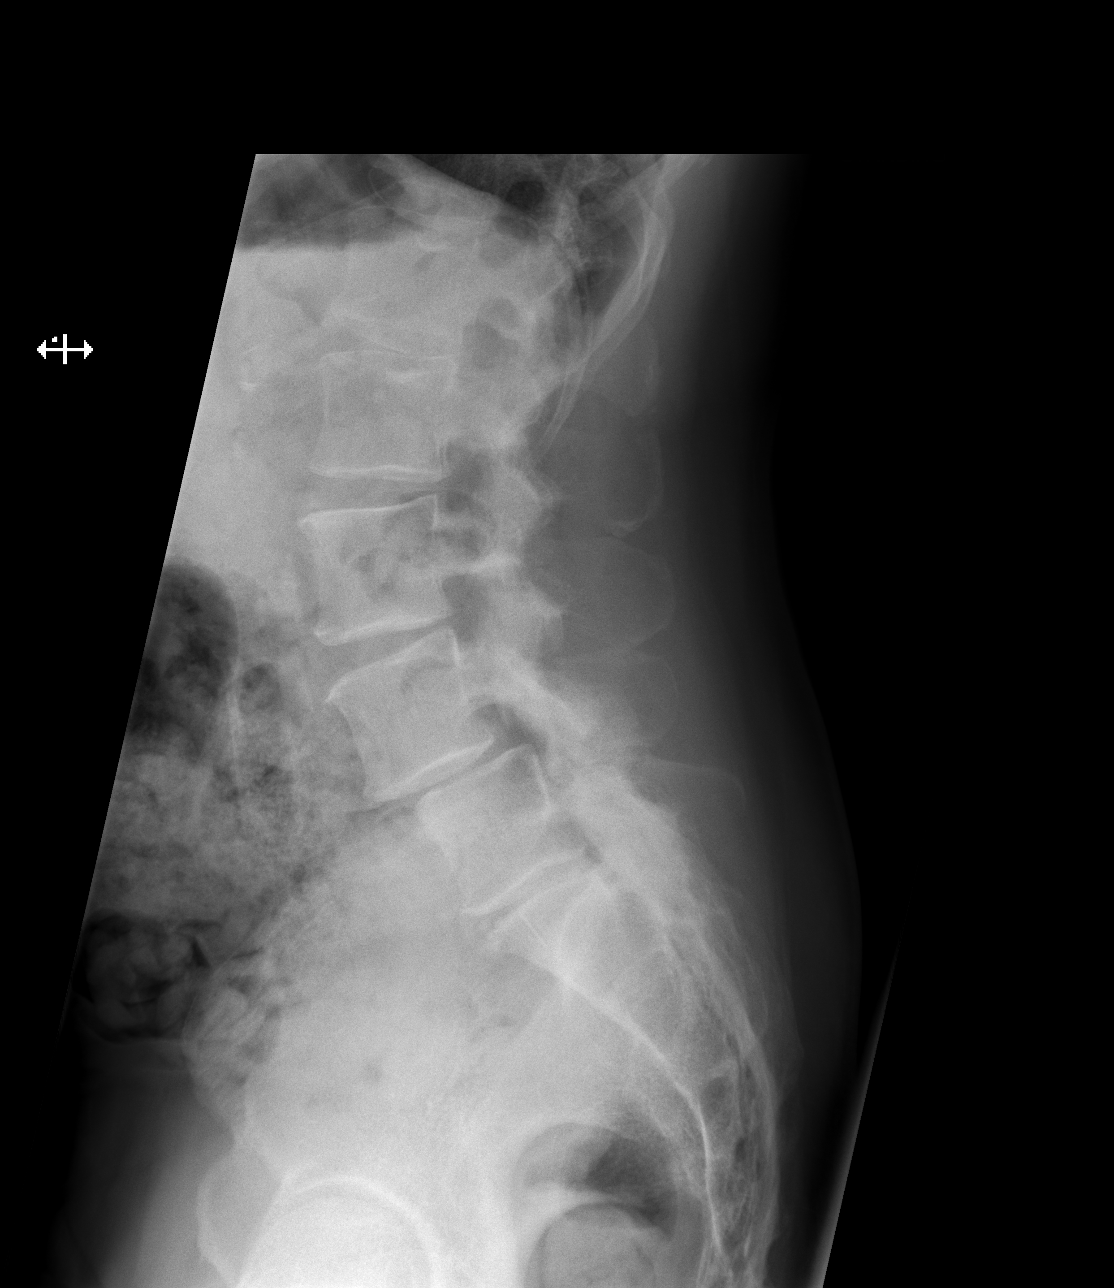

[4 of 4 positions shown; findings below may reference images not displayed]

FINDINGS: Advanced degenerative facet disease throughout the lumbar spine,
most pronounced in the lower lumbar spine. 10 mm of anterolisthesis
of L4 on L5. Diffuse degenerative disc disease, most pronounced in
the lower lumbar spine. No fracture. Rightward scoliosis in the
lumbar spine. SI joints symmetric and unremarkable.
IMPRESSION: Diffuse degenerative disc and facet disease, most pronounced in the
lower lumbar spine. Grade 1 anterolisthesis at L4-5. No acute bony
abnormality.

Mild rightward scoliosis.

## 2020-07-12 IMAGING — CR DG CERVICAL SPINE COMPLETE 4+V
4 series · 4 of 4 positions shown · non-contrast
Comparison: None.

CLINICAL DATA: Neck pain, NKI X 1 yrs +

EXAM:
CERVICAL SPINE - COMPLETE 4+ VIEW

[w cervical spine ap]
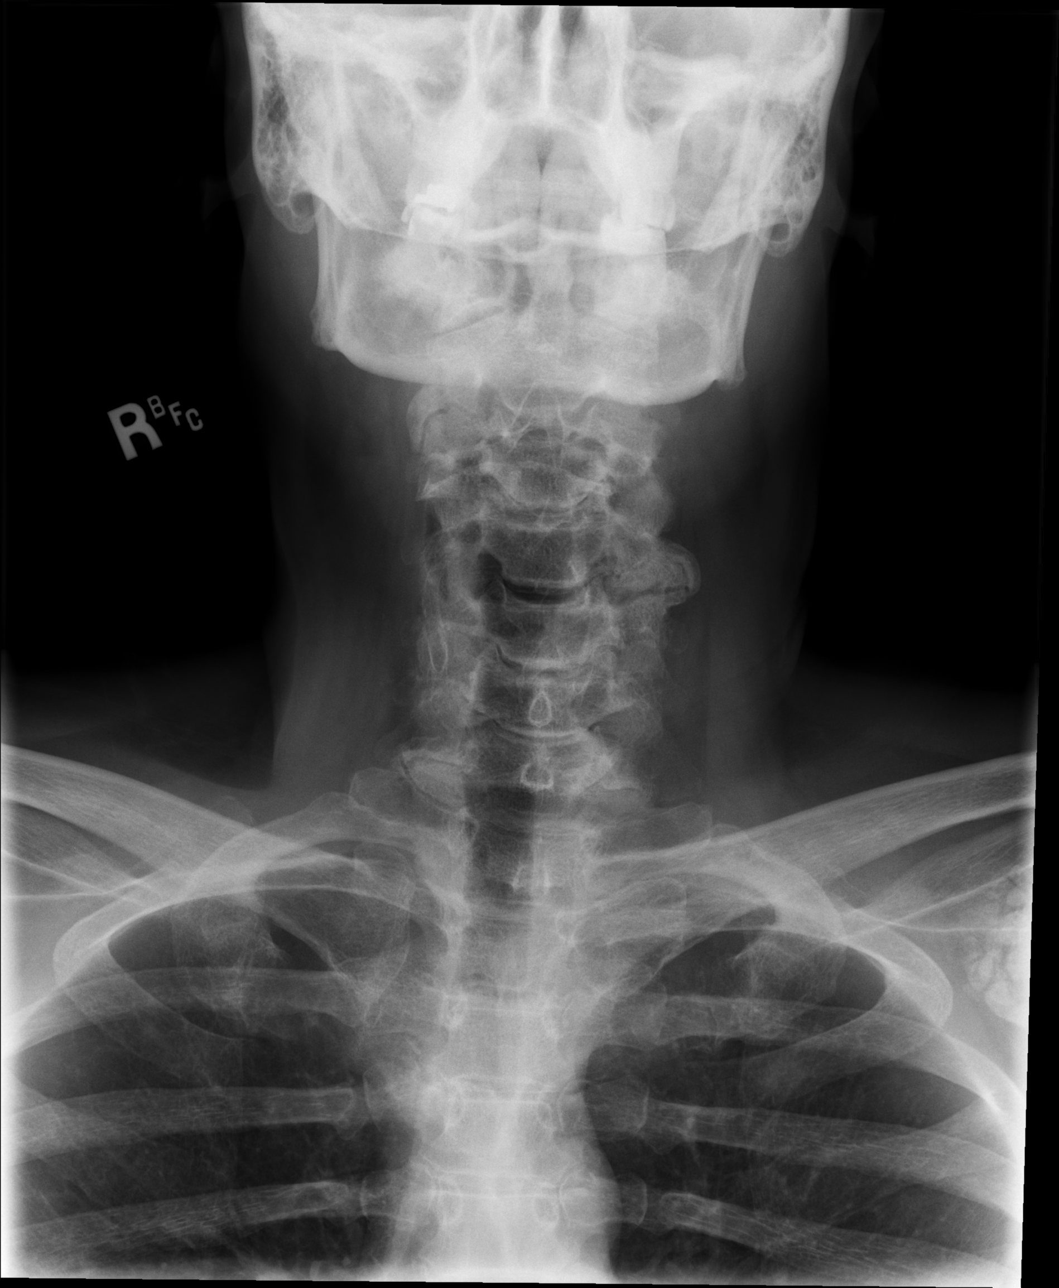

[w cervical spine lat (1 of 2)]
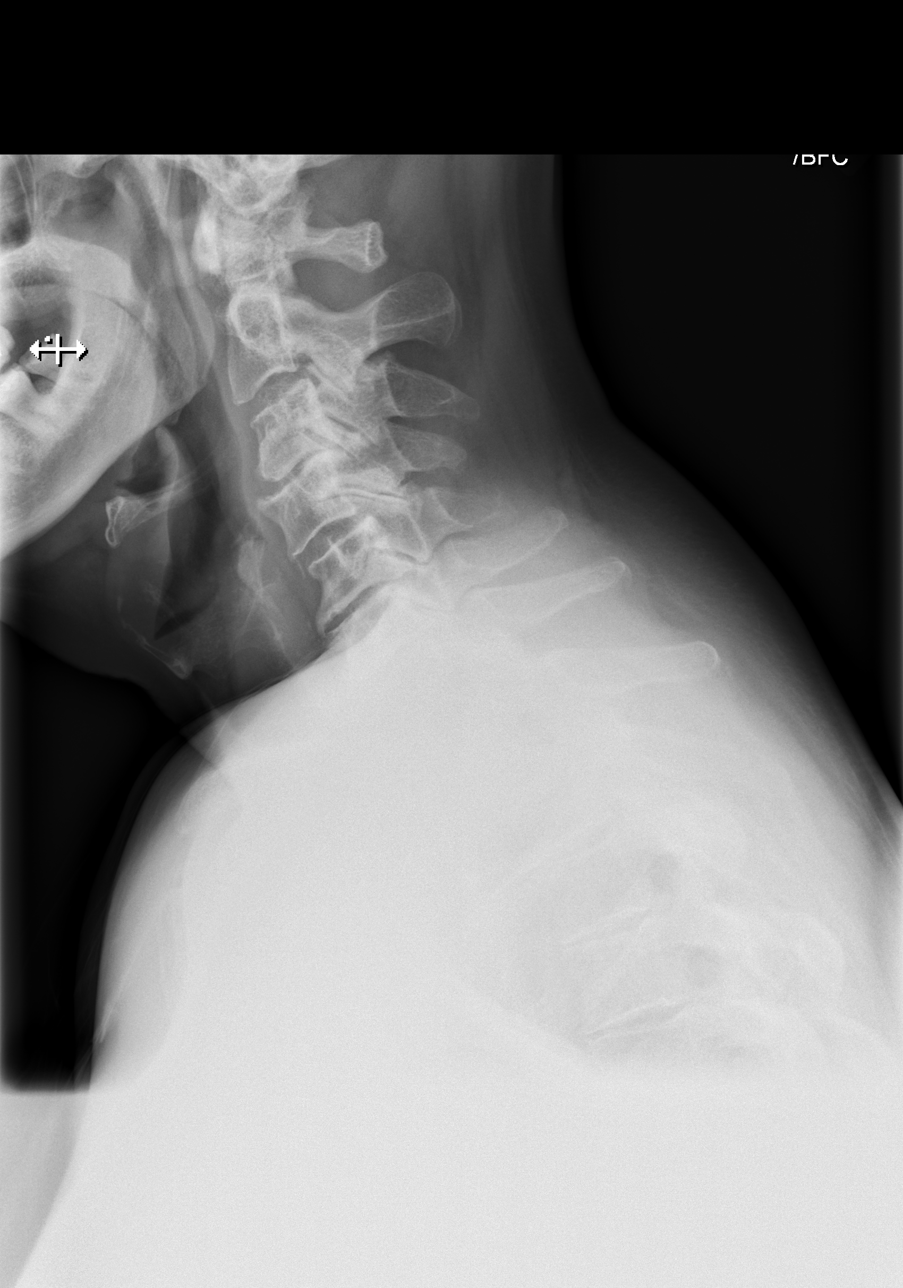

[w cervical spine lat (2 of 2)]
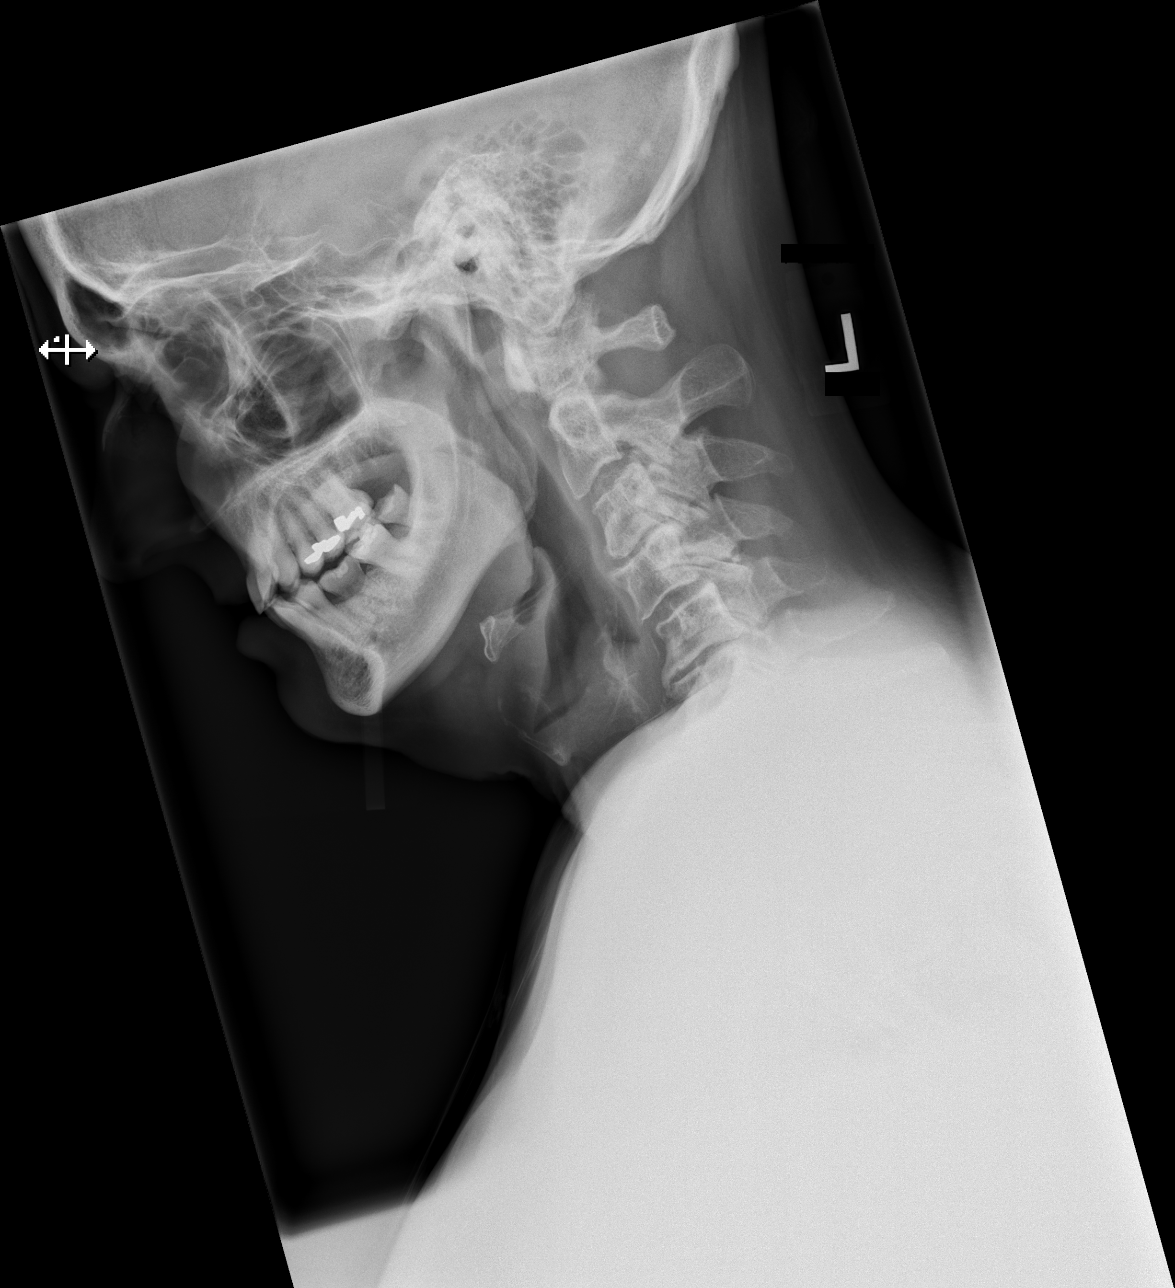

[w cervical spine ap_obl]
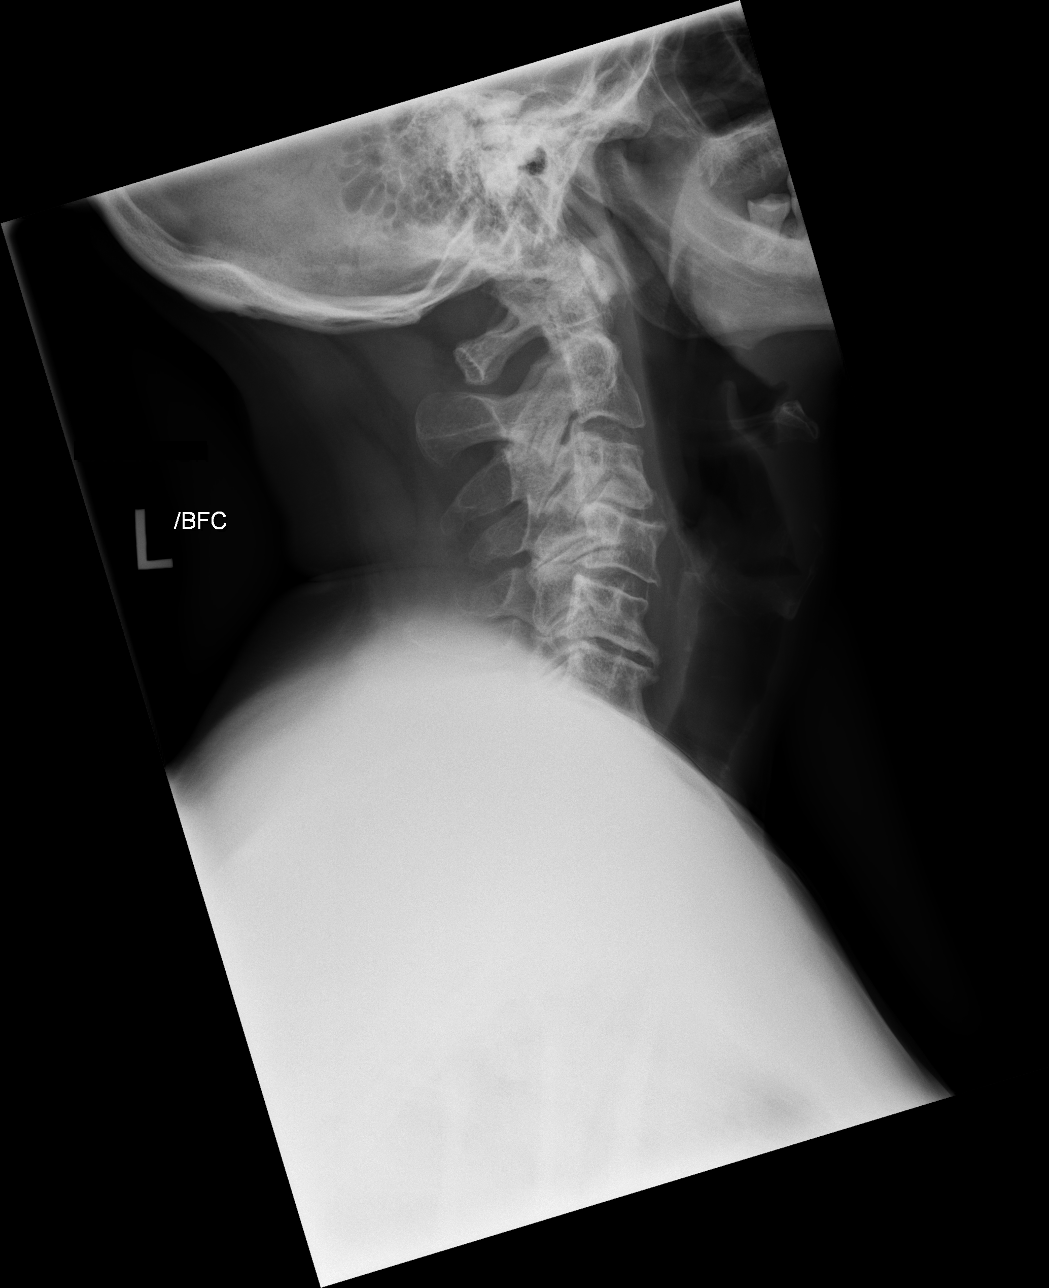

[4 of 4 positions shown; findings below may reference images not displayed]

FINDINGS: There are extensive degenerative changes throughout the cervical
spine, most notably at C3-4. There is 4 millimeters retrolisthesis
of C3 on C4. Disc height loss and osteophytes at C5-6. Detail below
the top of C6 is limited. On flexion and extension, the
retrolisthesis at C3-4 is stable. No evidence for translocation.
Prevertebral soft tissues are unremarkable.

Note is made of hydroxyapatite deposition disease MEDIAL to the LEFT
shoulder and is only partially imaged. The LEFT shoulder is not
included in the study.
IMPRESSION: 1. Extensive degenerative changes in the cervical spine.
2. Retrolisthesis at C3-4 is unchanged on flexion and extension.
3. Stable hydroxyapatite deposition disease to the LEFT shoulder.

## 2020-08-17 ENCOUNTER — Other Ambulatory Visit: Payer: Self-pay | Admitting: Pulmonary Disease

## 2020-08-21 ENCOUNTER — Telehealth: Payer: Self-pay | Admitting: Pulmonary Disease

## 2020-08-21 NOTE — Telephone Encounter (Signed)
Call returned to patient, confirmed DOB. Requesting refill of Ambien.  Last OV: 03/28/20 Last filled: 05/21/20 quantity of 30 with 2 refills   AO please advise. Medication has been pended. Thanks :)

## 2020-08-22 ENCOUNTER — Telehealth: Payer: Self-pay | Admitting: "Endocrinology

## 2020-08-22 NOTE — Telephone Encounter (Signed)
Pt is calling again in regards to getting his Ambien refilled did inform the pt we were awaiting a response from Dr. Wynona Neat will route to him; pt states that he is completely out.  Pls regard; [phone]

## 2020-08-22 NOTE — Telephone Encounter (Signed)
Called pt's mother, she stated they had been able to resolve the issue.

## 2020-08-22 NOTE — Telephone Encounter (Signed)
Pt mother Mindi Junker is calling and states that patient was seen 2 times in 2021 and was prescribed trulicity, and patient paid his usual $4 copay for the medication. She states insurance is now trying to bill him over $300 for both of those times he was on that medication in 2021. She states she is needing to know if a Physician Formulary Exception letter was sent in when the medication was. She can be reached at [phone]

## 2020-08-23 NOTE — Telephone Encounter (Signed)
Zolpidem refilled.

## 2020-08-25 ENCOUNTER — Other Ambulatory Visit: Payer: Self-pay | Admitting: Pulmonary Disease

## 2020-08-25 MED ORDER — ZOLPIDEM TARTRATE 10 MG PO TABS: ORAL_TABLET | ORAL | 2 refills | Status: DC

## 2020-08-25 NOTE — Telephone Encounter (Signed)
Ambien refilled

## 2020-08-25 NOTE — Progress Notes (Signed)
Ambien refilled

## 2020-08-29 NOTE — Telephone Encounter (Signed)
Called and spoke to pt. Pt states he has already picked up the script. Nothing further needed at this time.

## 2020-10-02 ENCOUNTER — Other Ambulatory Visit: Payer: Self-pay | Admitting: Neurosurgery

## 2020-10-02 DIAGNOSIS — M4302 Spondylolysis, cervical region: Secondary | ICD-10-CM

## 2020-10-11 ENCOUNTER — Other Ambulatory Visit: Payer: Self-pay | Admitting: Neurological Surgery

## 2020-10-12 ENCOUNTER — Other Ambulatory Visit: Payer: Self-pay

## 2020-10-12 ENCOUNTER — Encounter (HOSPITAL_COMMUNITY): Payer: Self-pay | Admitting: Neurological Surgery

## 2020-10-12 NOTE — Progress Notes (Signed)
PCP - Dr. Wynelle Link Cardiologist - denies EKG - DOS Chest x-ray -  ECHO -  Cardiac Cath -  CPAP - yes  Fasting Blood Sugar:  150s Checks Blood Sugar:  1x/wk  ERAS Protcol - n/a  COVID TEST- DOS  Anesthesia review: n/a  -------------  SDW INSTRUCTIONS:  Your procedure is scheduled on Monday 10/3. Please report to [facility] Main Entrance "A" at 1000 A.M., and check in at the Admitting office. Call this number if you have problems the morning of surgery: [phone]   Remember: Do not eat or drink after midnight the night before your surgery   Medications to take morning of surgery with a sip of water include: ALPRAZolam (XANAX XR)  buPROPion (WELLBUTRIN XL) FLUoxetine (PROZAC)  prazosin (MINIPRESS) pregabalin (LYRICA)  rosuvastatin (CRESTOR)    ** PLEASE check your blood sugar the morning of your surgery when you wake up and every 2 hours until you get to the Short Stay unit.  If your blood sugar is less than 70 mg/dL, you will need to treat for low blood sugar: Do not take insulin. Treat a low blood sugar (less than 70 mg/dL) with  cup of clear juice (cranberry or apple), 4 glucose tablets, OR glucose gel. Recheck blood sugar in 15 minutes after treatment (to make sure it is greater than 70 mg/dL). If your blood sugar is not greater than 70 mg/dL on recheck, call [phone] for further instructions.  glipiZIDE (GLUCOTROL XL)  10/2: AM usual; PM none 10/3: NONE  JARDIANCE 10/2: NONE 10/3: NONE  metFORMIN (GLUCOPHAGE) 10/2: usual 10/3: NONE   As of today, STOP taking any Aspirin (unless otherwise instructed by your surgeon), Aleve, Naproxen, Ibuprofen, Motrin, Advil, Goody's, BC's, all herbal medications, fish oil, and all vitamins.    The Morning of Surgery Do not wear jewelry Do not wear lotions, powders, colognes, or deodorant  Do not bring valuables to the hospital. [facility] is not responsible for any belongings or valuables.  If you are a smoker,  DO NOT Smoke 24 hours prior to surgery  If you wear a CPAP at night please bring your mask the morning of surgery   Remember that you must have someone to transport you home after your surgery, and remain with you for 24 hours if you are discharged the same day.  Please bring cases for contacts, glasses, hearing aids, dentures or bridgework because it cannot be worn into surgery.   Patients discharged the day of surgery will not be allowed to drive home.   Please shower the NIGHT BEFORE/MORNING OF SURGERY (use antibacterial soap like DIAL soap if possible). Wear comfortable clothes the morning of surgery. Oral Hygiene is also important to reduce your risk of infection.  Remember - BRUSH YOUR TEETH THE MORNING OF SURGERY WITH YOUR REGULAR TOOTHPASTE  Patient denies shortness of breath, fever, cough and chest pain.

## 2020-10-15 ENCOUNTER — Other Ambulatory Visit: Payer: Medicare Other

## 2020-10-16 ENCOUNTER — Inpatient Hospital Stay (HOSPITAL_COMMUNITY): Payer: Medicare Other | Admitting: Anesthesiology

## 2020-10-16 ENCOUNTER — Other Ambulatory Visit: Payer: Self-pay

## 2020-10-16 ENCOUNTER — Ambulatory Visit (HOSPITAL_COMMUNITY)
Admission: RE | Disposition: A | Discharge status: Home / Self Care | Payer: Self-pay | Source: Home / Self Care | Attending: Neurological Surgery

## 2020-10-16 ENCOUNTER — Encounter (HOSPITAL_COMMUNITY): Payer: Self-pay | Admitting: Neurological Surgery

## 2020-10-16 ENCOUNTER — Inpatient Hospital Stay (HOSPITAL_COMMUNITY)
Admission: RE | Admit: 2020-10-16 | Discharge: 2020-10-17 | DRG: 473 | Disposition: A | Discharge status: Home / Self Care | Payer: Medicare Other | Attending: Neurological Surgery | Admitting: Neurological Surgery

## 2020-10-16 ENCOUNTER — Inpatient Hospital Stay (HOSPITAL_COMMUNITY): Payer: Medicare Other

## 2020-10-16 DIAGNOSIS — E119 Type 2 diabetes mellitus without complications: Secondary | ICD-10-CM | POA: Diagnosis not present

## 2020-10-16 DIAGNOSIS — Z79899 Other long term (current) drug therapy: Secondary | ICD-10-CM

## 2020-10-16 DIAGNOSIS — Z981 Arthrodesis status: Secondary | ICD-10-CM

## 2020-10-16 DIAGNOSIS — I10 Essential (primary) hypertension: Secondary | ICD-10-CM | POA: Diagnosis present

## 2020-10-16 DIAGNOSIS — Z8249 Family history of ischemic heart disease and other diseases of the circulatory system: Secondary | ICD-10-CM

## 2020-10-16 DIAGNOSIS — E785 Hyperlipidemia, unspecified: Secondary | ICD-10-CM | POA: Diagnosis present

## 2020-10-16 DIAGNOSIS — Z8349 Family history of other endocrine, nutritional and metabolic diseases: Secondary | ICD-10-CM

## 2020-10-16 DIAGNOSIS — Z7984 Long term (current) use of oral hypoglycemic drugs: Secondary | ICD-10-CM

## 2020-10-16 DIAGNOSIS — Z419 Encounter for procedure for purposes other than remedying health state, unspecified: Secondary | ICD-10-CM

## 2020-10-16 DIAGNOSIS — Z833 Family history of diabetes mellitus: Secondary | ICD-10-CM

## 2020-10-16 DIAGNOSIS — M4802 Spinal stenosis, cervical region: Secondary | ICD-10-CM | POA: Diagnosis present

## 2020-10-16 DIAGNOSIS — G4730 Sleep apnea, unspecified: Secondary | ICD-10-CM | POA: Diagnosis present

## 2020-10-16 DIAGNOSIS — M47812 Spondylosis without myelopathy or radiculopathy, cervical region: Principal | ICD-10-CM | POA: Diagnosis present

## 2020-10-16 DIAGNOSIS — Z20822 Contact with and (suspected) exposure to covid-19: Secondary | ICD-10-CM | POA: Diagnosis not present

## 2020-10-16 DIAGNOSIS — Z87891 Personal history of nicotine dependence: Secondary | ICD-10-CM

## 2020-10-16 DIAGNOSIS — Z83438 Family history of other disorder of lipoprotein metabolism and other lipidemia: Secondary | ICD-10-CM

## 2020-10-16 DIAGNOSIS — Z885 Allergy status to narcotic agent status: Secondary | ICD-10-CM

## 2020-10-16 HISTORY — DX: Nausea with vomiting, unspecified: R11.2

## 2020-10-16 HISTORY — PX: ANTERIOR CERVICAL DECOMP/DISCECTOMY FUSION: SHX1161

## 2020-10-16 HISTORY — DX: Type 2 diabetes mellitus without complications: E11.9

## 2020-10-16 HISTORY — DX: Other complications of anesthesia, initial encounter: T88.59XA

## 2020-10-16 HISTORY — DX: Other specified postprocedural states: Z98.890

## 2020-10-16 LAB — POCT I-STAT, CHEM 8
BUN: 19 mg/dL (ref 6–20)
Calcium, Ion: 1.27 mmol/L (ref 1.15–1.40)
Chloride: 102 mmol/L (ref 98–111)
Creatinine, Ser: 0.8 mg/dL (ref 0.61–1.24)
Glucose, Bld: 151 mg/dL — ABNORMAL HIGH (ref 70–99)
HCT: 44 % (ref 39.0–52.0)
Hemoglobin: 15 g/dL (ref 13.0–17.0)
Potassium: 4.2 mmol/L (ref 3.5–5.1)
Sodium: 140 mmol/L (ref 135–145)
TCO2: 27 mmol/L (ref 22–32)

## 2020-10-16 LAB — GLUCOSE, CAPILLARY
Glucose-Capillary: 146 mg/dL — ABNORMAL HIGH (ref 70–99)
Glucose-Capillary: 137 mg/dL — ABNORMAL HIGH (ref 70–99)
Glucose-Capillary: 186 mg/dL — ABNORMAL HIGH (ref 70–99)

## 2020-10-16 LAB — ABO/RH: ABO/RH(D): O POS

## 2020-10-16 LAB — PROTIME-INR
INR: 1 (ref 0.8–1.2)
Prothrombin Time: 13.3 seconds (ref 11.4–15.2)

## 2020-10-16 LAB — TYPE AND SCREEN
ABO/RH(D): O POS
Antibody Screen: NEGATIVE

## 2020-10-16 LAB — HEMOGLOBIN A1C
Hgb A1c MFr Bld: 8 % — ABNORMAL HIGH (ref 4.8–5.6)
Mean Plasma Glucose: 182.9 mg/dL

## 2020-10-16 LAB — SARS CORONAVIRUS 2 BY RT PCR (HOSPITAL ORDER, PERFORMED IN ~~LOC~~ HOSPITAL LAB): SARS Coronavirus 2: NEGATIVE

## 2020-10-16 LAB — SURGICAL PCR SCREEN
MRSA, PCR: NEGATIVE
Staphylococcus aureus: NEGATIVE

## 2020-10-16 SURGERY — ANTERIOR CERVICAL DECOMPRESSION/DISCECTOMY FUSION 3 LEVELS
Anesthesia: General

## 2020-10-16 MED ORDER — MORPHINE SULFATE (PF) 2 MG/ML IV SOLN
2.0000 mg | INTRAVENOUS | Status: DC | PRN
  Administered 2020-10-17: 2 mg via INTRAVENOUS
  Filled 2020-10-16: qty 1

## 2020-10-16 MED ORDER — THROMBIN 5000 UNITS EX SOLR
CUTANEOUS | Status: AC
  Filled 2020-10-16: qty 10000

## 2020-10-16 MED ORDER — ALPRAZOLAM 0.5 MG PO TABS
1.0000 mg | ORAL_TABLET | Freq: Three times a day (TID) | ORAL | Status: DC
  Administered 2020-10-17: 1 mg via ORAL
  Filled 2020-10-16: qty 2

## 2020-10-16 MED ORDER — DEXAMETHASONE SODIUM PHOSPHATE 10 MG/ML IJ SOLN
10.0000 mg | Freq: Once | INTRAMUSCULAR | Status: AC
  Administered 2020-10-16: 10 mg via INTRAVENOUS

## 2020-10-16 MED ORDER — LISDEXAMFETAMINE DIMESYLATE 50 MG PO CAPS: 50.0000 mg | ORAL_CAPSULE | Freq: Every morning | ORAL | Status: DC

## 2020-10-16 MED ORDER — ONDANSETRON HCL 4 MG PO TABS: 4.0000 mg | ORAL_TABLET | Freq: Four times a day (QID) | ORAL | Status: DC | PRN

## 2020-10-16 MED ORDER — ONDANSETRON HCL 4 MG/2ML IJ SOLN
INTRAMUSCULAR | Status: DC | PRN
  Administered 2020-10-16: 4 mg via INTRAVENOUS

## 2020-10-16 MED ORDER — EMPAGLIFLOZIN 10 MG PO TABS
10.0000 mg | ORAL_TABLET | Freq: Every day | ORAL | Status: DC
  Administered 2020-10-17: 10 mg via ORAL
  Filled 2020-10-16: qty 1

## 2020-10-16 MED ORDER — AMPHETAMINE-DEXTROAMPHETAMINE 10 MG PO TABS: 20.0000 mg | ORAL_TABLET | Freq: Every day | ORAL | Status: DC

## 2020-10-16 MED ORDER — SODIUM CHLORIDE 0.9 % IV SOLN: 250.0000 mL | INTRAVENOUS | Status: DC

## 2020-10-16 MED ORDER — PROPOFOL 10 MG/ML IV BOLUS
INTRAVENOUS | Status: DC | PRN
  Administered 2020-10-16: 200 mg via INTRAVENOUS

## 2020-10-16 MED ORDER — POTASSIUM CHLORIDE IN NACL 20-0.9 MEQ/L-% IV SOLN: INTRAVENOUS | Status: DC

## 2020-10-16 MED ORDER — THROMBIN 5000 UNITS EX SOLR
CUTANEOUS | Status: AC
  Filled 2020-10-16: qty 5000

## 2020-10-16 MED ORDER — GLIPIZIDE ER 10 MG PO TB24
10.0000 mg | ORAL_TABLET | Freq: Every day | ORAL | Status: DC
  Administered 2020-10-17: 10 mg via ORAL
  Filled 2020-10-16: qty 1

## 2020-10-16 MED ORDER — PREGABALIN 100 MG PO CAPS
100.0000 mg | ORAL_CAPSULE | Freq: Two times a day (BID) | ORAL | Status: DC
  Administered 2020-10-16 – 2020-10-17 (×2): 100 mg via ORAL
  Filled 2020-10-16 (×2): qty 1

## 2020-10-16 MED ORDER — CEFAZOLIN SODIUM-DEXTROSE 2-4 GM/100ML-% IV SOLN
2.0000 g | INTRAVENOUS | Status: AC
  Administered 2020-10-16: 2 g via INTRAVENOUS
  Filled 2020-10-16: qty 100

## 2020-10-16 MED ORDER — HYDROCODONE-ACETAMINOPHEN 10-325 MG PO TABS
1.0000 | ORAL_TABLET | ORAL | Status: DC | PRN
  Administered 2020-10-16: 1 via ORAL
  Filled 2020-10-16: qty 1

## 2020-10-16 MED ORDER — METFORMIN HCL 500 MG PO TABS
2000.0000 mg | ORAL_TABLET | Freq: Every day | ORAL | Status: DC
  Administered 2020-10-17: 2000 mg via ORAL
  Filled 2020-10-16: qty 4

## 2020-10-16 MED ORDER — CHLORHEXIDINE GLUCONATE 0.12 % MT SOLN: 15.0000 mL | Freq: Once | OROMUCOSAL | Status: AC

## 2020-10-16 MED ORDER — LIDOCAINE 2% (20 MG/ML) 5 ML SYRINGE
INTRAMUSCULAR | Status: DC | PRN
  Administered 2020-10-16: 60 mg via INTRAVENOUS

## 2020-10-16 MED ORDER — METHOCARBAMOL 500 MG PO TABS
500.0000 mg | ORAL_TABLET | Freq: Four times a day (QID) | ORAL | Status: DC | PRN
  Administered 2020-10-16 – 2020-10-17 (×3): 500 mg via ORAL
  Filled 2020-10-16 (×3): qty 1

## 2020-10-16 MED ORDER — PROPOFOL 10 MG/ML IV BOLUS
INTRAVENOUS | Status: AC
  Filled 2020-10-16: qty 20

## 2020-10-16 MED ORDER — ACETAMINOPHEN 500 MG PO TABS
1000.0000 mg | ORAL_TABLET | ORAL | Status: AC
  Administered 2020-10-16: 1000 mg via ORAL
  Filled 2020-10-16: qty 2

## 2020-10-16 MED ORDER — ROCURONIUM BROMIDE 10 MG/ML (PF) SYRINGE
PREFILLED_SYRINGE | INTRAVENOUS | Status: DC | PRN
  Administered 2020-10-16: 50 mg via INTRAVENOUS
  Administered 2020-10-16 (×2): 30 mg via INTRAVENOUS
  Administered 2020-10-16: 20 mg via INTRAVENOUS

## 2020-10-16 MED ORDER — BUPIVACAINE HCL (PF) 0.25 % IJ SOLN
INTRAMUSCULAR | Status: DC | PRN
  Administered 2020-10-16: 5 mL

## 2020-10-16 MED ORDER — PROPOFOL 500 MG/50ML IV EMUL
INTRAVENOUS | Status: DC | PRN
  Administered 2020-10-16: 25 ug/kg/min via INTRAVENOUS

## 2020-10-16 MED ORDER — THROMBIN 5000 UNITS EX SOLR: OROMUCOSAL | Status: DC | PRN

## 2020-10-16 MED ORDER — PREGABALIN 100 MG PO CAPS: 100.0000 mg | ORAL_CAPSULE | Freq: Every morning | ORAL | Status: DC

## 2020-10-16 MED ORDER — CHLORHEXIDINE GLUCONATE 0.12 % MT SOLN
OROMUCOSAL | Status: AC
  Administered 2020-10-16: 15 mL via OROMUCOSAL
  Filled 2020-10-16: qty 15

## 2020-10-16 MED ORDER — OXYCODONE-ACETAMINOPHEN 5-325 MG PO TABS
1.0000 | ORAL_TABLET | ORAL | Status: DC | PRN
  Administered 2020-10-16 – 2020-10-17 (×4): 2 via ORAL
  Filled 2020-10-16 (×4): qty 2

## 2020-10-16 MED ORDER — LACTATED RINGERS IV SOLN: INTRAVENOUS | Status: DC | PRN

## 2020-10-16 MED ORDER — ACETAMINOPHEN 650 MG RE SUPP: 650.0000 mg | RECTAL | Status: DC | PRN

## 2020-10-16 MED ORDER — ZOLPIDEM TARTRATE 5 MG PO TABS
10.0000 mg | ORAL_TABLET | Freq: Every day | ORAL | Status: DC
  Administered 2020-10-16: 10 mg via ORAL
  Filled 2020-10-16: qty 2

## 2020-10-16 MED ORDER — BUPROPION HCL ER (XL) 300 MG PO TB24
300.0000 mg | ORAL_TABLET | Freq: Every morning | ORAL | Status: DC
  Administered 2020-10-17: 300 mg via ORAL
  Filled 2020-10-16: qty 1

## 2020-10-16 MED ORDER — MIDAZOLAM HCL 5 MG/5ML IJ SOLN
INTRAMUSCULAR | Status: DC | PRN
  Administered 2020-10-16: 2 mg via INTRAVENOUS

## 2020-10-16 MED ORDER — SENNA 8.6 MG PO TABS
1.0000 | ORAL_TABLET | Freq: Two times a day (BID) | ORAL | Status: DC
  Administered 2020-10-16 – 2020-10-17 (×2): 8.6 mg via ORAL
  Filled 2020-10-16 (×2): qty 1

## 2020-10-16 MED ORDER — PHENYLEPHRINE HCL-NACL 20-0.9 MG/250ML-% IV SOLN
INTRAVENOUS | Status: DC | PRN
  Administered 2020-10-16: 25 ug/min via INTRAVENOUS

## 2020-10-16 MED ORDER — BUPIVACAINE HCL (PF) 0.25 % IJ SOLN
INTRAMUSCULAR | Status: AC
  Filled 2020-10-16: qty 30

## 2020-10-16 MED ORDER — INSULIN ASPART 100 UNIT/ML IJ SOLN
0.0000 [IU] | Freq: Three times a day (TID) | INTRAMUSCULAR | Status: DC
  Administered 2020-10-17: 2 [IU] via SUBCUTANEOUS

## 2020-10-16 MED ORDER — FENTANYL CITRATE (PF) 250 MCG/5ML IJ SOLN
INTRAMUSCULAR | Status: AC
  Filled 2020-10-16: qty 5

## 2020-10-16 MED ORDER — PRAZOSIN HCL 1 MG PO CAPS
1.0000 mg | ORAL_CAPSULE | Freq: Every evening | ORAL | Status: DC | PRN
  Filled 2020-10-16: qty 1

## 2020-10-16 MED ORDER — ROCURONIUM BROMIDE 10 MG/ML (PF) SYRINGE
PREFILLED_SYRINGE | INTRAVENOUS | Status: AC
  Filled 2020-10-16: qty 20

## 2020-10-16 MED ORDER — 0.9 % SODIUM CHLORIDE (POUR BTL) OPTIME
TOPICAL | Status: DC | PRN
  Administered 2020-10-16: 1000 mL

## 2020-10-16 MED ORDER — DIVALPROEX SODIUM ER 500 MG PO TB24
1500.0000 mg | ORAL_TABLET | Freq: Every day | ORAL | Status: DC
  Administered 2020-10-16: 1500 mg via ORAL
  Filled 2020-10-16 (×2): qty 3

## 2020-10-16 MED ORDER — FENTANYL CITRATE (PF) 100 MCG/2ML IJ SOLN
INTRAMUSCULAR | Status: DC | PRN
  Administered 2020-10-16: 75 ug via INTRAVENOUS
  Administered 2020-10-16 (×3): 50 ug via INTRAVENOUS
  Administered 2020-10-16: 100 ug via INTRAVENOUS
  Administered 2020-10-16: 50 ug via INTRAVENOUS

## 2020-10-16 MED ORDER — MENTHOL 3 MG MT LOZG
1.0000 | LOZENGE | OROMUCOSAL | Status: DC | PRN
  Filled 2020-10-16: qty 9

## 2020-10-16 MED ORDER — LIDOCAINE 2% (20 MG/ML) 5 ML SYRINGE
INTRAMUSCULAR | Status: AC
  Filled 2020-10-16: qty 5

## 2020-10-16 MED ORDER — LOSARTAN POTASSIUM 50 MG PO TABS
50.0000 mg | ORAL_TABLET | Freq: Every day | ORAL | Status: DC
  Administered 2020-10-16 – 2020-10-17 (×2): 50 mg via ORAL
  Filled 2020-10-16 (×2): qty 1

## 2020-10-16 MED ORDER — GABAPENTIN 300 MG PO CAPS
300.0000 mg | ORAL_CAPSULE | ORAL | Status: AC
  Administered 2020-10-16: 300 mg via ORAL
  Filled 2020-10-16: qty 1

## 2020-10-16 MED ORDER — ONDANSETRON HCL 4 MG/2ML IJ SOLN: 4.0000 mg | Freq: Four times a day (QID) | INTRAMUSCULAR | Status: DC | PRN

## 2020-10-16 MED ORDER — SUGAMMADEX SODIUM 200 MG/2ML IV SOLN
INTRAVENOUS | Status: DC | PRN
  Administered 2020-10-16: 200 mg via INTRAVENOUS

## 2020-10-16 MED ORDER — PHENOL 1.4 % MT LIQD
1.0000 | OROMUCOSAL | Status: DC | PRN
  Filled 2020-10-16: qty 177

## 2020-10-16 MED ORDER — HYDROMORPHONE HCL 1 MG/ML IJ SOLN
INTRAMUSCULAR | Status: AC
  Filled 2020-10-16: qty 1

## 2020-10-16 MED ORDER — MIDAZOLAM HCL 2 MG/2ML IJ SOLN
INTRAMUSCULAR | Status: AC
  Filled 2020-10-16: qty 2

## 2020-10-16 MED ORDER — PHENYLEPHRINE 40 MCG/ML (10ML) SYRINGE FOR IV PUSH (FOR BLOOD PRESSURE SUPPORT)
PREFILLED_SYRINGE | INTRAVENOUS | Status: DC | PRN
  Administered 2020-10-16: 80 ug via INTRAVENOUS

## 2020-10-16 MED ORDER — FENTANYL CITRATE (PF) 100 MCG/2ML IJ SOLN
INTRAMUSCULAR | Status: AC
  Filled 2020-10-16: qty 2

## 2020-10-16 MED ORDER — ORAL CARE MOUTH RINSE: 15.0000 mL | Freq: Once | OROMUCOSAL | Status: AC

## 2020-10-16 MED ORDER — CYCLOSPORINE 0.05 % OP EMUL
1.0000 [drp] | Freq: Two times a day (BID) | OPHTHALMIC | Status: DC
  Administered 2020-10-16 – 2020-10-17 (×2): 1 [drp] via OPHTHALMIC
  Filled 2020-10-16 (×3): qty 30

## 2020-10-16 MED ORDER — CEFAZOLIN SODIUM-DEXTROSE 2-4 GM/100ML-% IV SOLN
2.0000 g | Freq: Three times a day (TID) | INTRAVENOUS | Status: AC
  Administered 2020-10-16 – 2020-10-17 (×2): 2 g via INTRAVENOUS
  Filled 2020-10-16 (×2): qty 100

## 2020-10-16 MED ORDER — CHLORHEXIDINE GLUCONATE CLOTH 2 % EX PADS: 6.0000 | MEDICATED_PAD | Freq: Once | CUTANEOUS | Status: DC

## 2020-10-16 MED ORDER — SODIUM CHLORIDE 0.9% FLUSH: 3.0000 mL | Freq: Two times a day (BID) | INTRAVENOUS | Status: DC

## 2020-10-16 MED ORDER — SODIUM CHLORIDE 0.9% FLUSH: 3.0000 mL | INTRAVENOUS | Status: DC | PRN

## 2020-10-16 MED ORDER — STERILE WATER FOR IRRIGATION IR SOLN
Status: DC | PRN
  Administered 2020-10-16: 1000 mL

## 2020-10-16 MED ORDER — SODIUM CHLORIDE 0.9 % IV SOLN: INTRAVENOUS | Status: DC

## 2020-10-16 MED ORDER — ALPRAZOLAM ER 1 MG PO TB24: 3.0000 mg | ORAL_TABLET | Freq: Every morning | ORAL | Status: DC

## 2020-10-16 MED ORDER — TAMSULOSIN HCL 0.4 MG PO CAPS
0.4000 mg | ORAL_CAPSULE | Freq: Every morning | ORAL | Status: DC
  Administered 2020-10-16 – 2020-10-17 (×2): 0.4 mg via ORAL
  Filled 2020-10-16 (×2): qty 1

## 2020-10-16 MED ORDER — LISDEXAMFETAMINE DIMESYLATE 30 MG PO CAPS
50.0000 mg | ORAL_CAPSULE | Freq: Every day | ORAL | Status: DC
  Filled 2020-10-16: qty 1

## 2020-10-16 MED ORDER — KETAMINE HCL 50 MG/ML IJ SOLN
INTRAMUSCULAR | Status: DC | PRN
  Administered 2020-10-16: 20 mg via INTRAMUSCULAR

## 2020-10-16 MED ORDER — EPHEDRINE 5 MG/ML INJ
INTRAVENOUS | Status: AC
  Filled 2020-10-16: qty 5

## 2020-10-16 MED ORDER — HYDROMORPHONE HCL 1 MG/ML IJ SOLN
0.2500 mg | INTRAMUSCULAR | Status: DC | PRN
  Administered 2020-10-16 (×2): 0.25 mg via INTRAVENOUS
  Administered 2020-10-16: 0.5 mg via INTRAVENOUS
  Administered 2020-10-16: 0.25 mg via INTRAVENOUS
  Administered 2020-10-16: 0.5 mg via INTRAVENOUS
  Administered 2020-10-16: 0.25 mg via INTRAVENOUS

## 2020-10-16 MED ORDER — ACETAMINOPHEN 325 MG PO TABS: 650.0000 mg | ORAL_TABLET | ORAL | Status: DC | PRN

## 2020-10-16 MED ORDER — KETAMINE HCL 50 MG/5ML IJ SOSY
PREFILLED_SYRINGE | INTRAMUSCULAR | Status: AC
  Filled 2020-10-16: qty 5

## 2020-10-16 MED ORDER — LISDEXAMFETAMINE DIMESYLATE 30 MG PO CAPS: ORAL_CAPSULE | Freq: Every day | ORAL | Status: DC

## 2020-10-16 MED ORDER — PHENYLEPHRINE 40 MCG/ML (10ML) SYRINGE FOR IV PUSH (FOR BLOOD PRESSURE SUPPORT)
PREFILLED_SYRINGE | INTRAVENOUS | Status: AC
  Filled 2020-10-16: qty 10

## 2020-10-16 MED ORDER — METHOCARBAMOL 1000 MG/10ML IJ SOLN
500.0000 mg | Freq: Four times a day (QID) | INTRAVENOUS | Status: DC | PRN
  Filled 2020-10-16: qty 5

## 2020-10-16 MED ORDER — FLUOXETINE HCL 20 MG PO CAPS
80.0000 mg | ORAL_CAPSULE | Freq: Every morning | ORAL | Status: DC
  Administered 2020-10-17: 80 mg via ORAL
  Filled 2020-10-16: qty 4

## 2020-10-16 MED ORDER — FENTANYL CITRATE (PF) 100 MCG/2ML IJ SOLN
25.0000 ug | INTRAMUSCULAR | Status: DC | PRN
  Administered 2020-10-16 (×3): 50 ug via INTRAVENOUS

## 2020-10-16 SURGICAL SUPPLY — 56 items
APL SKNCLS STERI-STRIP NONHPOA (GAUZE/BANDAGES/DRESSINGS) ×1
BAG COUNTER SPONGE SURGICOUNT (BAG) ×2 IMPLANT
BAG SPNG CNTER NS LX DISP (BAG) ×1
BAND INSRT 18 STRL LF DISP RB (MISCELLANEOUS) ×2
BAND RUBBER #18 3X1/16 STRL (MISCELLANEOUS) ×4 IMPLANT
BASKET BONE COLLECTION (BASKET) IMPLANT
BENZOIN TINCTURE PRP APPL 2/3 (GAUZE/BANDAGES/DRESSINGS) ×2 IMPLANT
BIT DRILL 2.3X12 (BIT) ×2 IMPLANT
BUR CARBIDE MATCH 3.0 (BURR) ×2 IMPLANT
CANISTER SUCT 3000ML PPV (MISCELLANEOUS) ×2 IMPLANT
CLSR STERI-STRIP ANTIMIC 1/2X4 (GAUZE/BANDAGES/DRESSINGS) ×2 IMPLANT
DRAIN JACKSON PRT FLT 7MM (DRAIN) ×2 IMPLANT
DRAPE C-ARM 42X72 X-RAY (DRAPES) ×4 IMPLANT
DRAPE LAPAROTOMY 100X72 PEDS (DRAPES) ×2 IMPLANT
DRAPE MICROSCOPE LEICA (MISCELLANEOUS) ×2 IMPLANT
DRSG OPSITE POSTOP 4X6 (GAUZE/BANDAGES/DRESSINGS) ×2 IMPLANT
DURAPREP 6ML APPLICATOR 50/CS (WOUND CARE) ×2 IMPLANT
ELECT COATED BLADE 2.86 ST (ELECTRODE) ×2 IMPLANT
ELECT REM PT RETURN 9FT ADLT (ELECTROSURGICAL) ×2
ELECTRODE REM PT RTRN 9FT ADLT (ELECTROSURGICAL) ×1 IMPLANT
EVACUATOR SILICONE 100CC (DRAIN) ×2 IMPLANT
GAUZE 4X4 16PLY ~~LOC~~+RFID DBL (SPONGE) IMPLANT
GLOVE SURG ENC MOIS LTX SZ7 (GLOVE) IMPLANT
GLOVE SURG ENC MOIS LTX SZ8 (GLOVE) ×2 IMPLANT
GLOVE SURG UNDER POLY LF SZ7 (GLOVE) IMPLANT
GOWN STRL REUS W/ TWL LRG LVL3 (GOWN DISPOSABLE) IMPLANT
GOWN STRL REUS W/ TWL XL LVL3 (GOWN DISPOSABLE) ×1 IMPLANT
GOWN STRL REUS W/TWL 2XL LVL3 (GOWN DISPOSABLE) IMPLANT
GOWN STRL REUS W/TWL LRG LVL3 (GOWN DISPOSABLE)
GOWN STRL REUS W/TWL XL LVL3 (GOWN DISPOSABLE) ×2
HEMOSTAT POWDER KIT SURGIFOAM (HEMOSTASIS) ×4 IMPLANT
HEMOSTAT POWDER SURGIFOAM 1G (HEMOSTASIS) ×2 IMPLANT
KIT BASIN OR (CUSTOM PROCEDURE TRAY) ×2 IMPLANT
KIT TURNOVER KIT B (KITS) ×2 IMPLANT
NEEDLE HYPO 25X1 1.5 SAFETY (NEEDLE) ×2 IMPLANT
NEEDLE SPNL 20GX3.5 QUINCKE YW (NEEDLE) ×2 IMPLANT
NS IRRIG 1000ML POUR BTL (IV SOLUTION) ×2 IMPLANT
PACK LAMINECTOMY NEURO (CUSTOM PROCEDURE TRAY) ×2 IMPLANT
PAD ARMBOARD 7.5X6 YLW CONV (MISCELLANEOUS) ×2 IMPLANT
PIN DISTRACTION 14MM (PIN) IMPLANT
PLATE ACP 62X3 LVL NS LF SPN (Plate) ×1 IMPLANT
PLATE ACP INSIGNIA 62 3L (Plate) ×2 IMPLANT
PUTTY BONE 1CC (Putty) ×2 IMPLANT
SCREW VA SINGLE LEAD 4X14 (Screw) ×4 IMPLANT
SCREW VA SINGLE LEAD 4X14 ST (Screw) ×2 IMPLANT
SCREW VA SINGLE LEAD 4X16 (Screw) ×14 IMPLANT
SPACER CERV NANO 6X16X14 7D (Spacer) ×2 IMPLANT
SPACER CERV NANO 8X16X14 7D (Spacer) ×4 IMPLANT
SPONGE INTESTINAL PEANUT (DISPOSABLE) ×2 IMPLANT
SPONGE SURGIFOAM ABS GEL 100 (HEMOSTASIS) IMPLANT
STRIP CLOSURE SKIN 1/2X4 (GAUZE/BANDAGES/DRESSINGS) ×2 IMPLANT
SUT VIC AB 3-0 SH 8-18 (SUTURE) ×2 IMPLANT
SUT VICRYL 4-0 PS2 18IN ABS (SUTURE) IMPLANT
TOWEL GREEN STERILE (TOWEL DISPOSABLE) ×2 IMPLANT
TOWEL GREEN STERILE FF (TOWEL DISPOSABLE) ×2 IMPLANT
WATER STERILE IRR 1000ML POUR (IV SOLUTION) ×2 IMPLANT

## 2020-10-16 NOTE — Op Note (Signed)
10/16/2020  5:05 PM  PATIENT:  Edward Garner  55 y.o. 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 Decompressive anterior cervical discectomy C3-4 C4-5 C5-6, 2. Anterior cervical arthrodesis C3-4 C4-5 C5-6 utilizing a Nano tech peek interbody cage packed with locally harvested morcellized autologous bone graft and DBM putty, 3. Anterior cervical plating C3-C6 inclusive utilizing a Alphatec plate  SURGEON:  [name], MD  ASSISTANTS: Verlin Dike FNP  ANESTHESIA:   General  EBL: 100 ml  Total I/O In: 500 [I.V.:500] Out: 100 [Blood:100]  BLOOD ADMINISTERED: none  DRAINS: 7 flat JP  SPECIMEN:  none  INDICATION FOR PROCEDURE: This patient presented with neck and arm pain. Imaging showed severe spondylosis with significant spinal stenosis C3-4 and C5-6 with spondylosis and facet arthropathy C4-5. The patient tried conservative measures without relief. Pain was debilitating. Recommended ACDF with plating. Patient understood the risks, benefits, and alternatives and potential outcomes and wished to proceed.  PROCEDURE DETAILS: Patient was brought to the operating room placed under general endotracheal anesthesia. Patient was placed in the supine position on the operating room table. The neck was prepped with Duraprep and draped in a sterile fashion.   Three cc of local anesthesia was injected and a transverse incision was made on the right side of the neck.  Dissection was carried down thru the subcutaneous tissue and the platysma was  elevated, opened, and undermined with Metzenbaum scissors.  Dissection was then carried out thru an avascular plane leaving the sternocleidomastoid carotid artery and jugular vein laterally and the trachea and esophagus medially with the assistance of my nurse practitioner. The ventral aspect of the  vertebral column was identified and a localizing x-ray was taken. The C4-5 level was identified and all in the room agreed with the level. The longus colli muscles were then elevated and the retractor was placed with the assistance of my nurse practitioner to expose C3-4 C4-5 and C5-6.  Anterior osteophytes were removed from each level.  Traction pins were used at each level.  The annulus was incised and the disc space entered. Discectomy was performed with micro-curettes and pituitary rongeurs. I then used the high-speed drill to drill the endplates down to the level of the posterior longitudinal ligament. The drill shavings were saved in a mucous trap for later arthrodesis. The operating microscope was draped and brought into the field provided additional magnification, illumination and visualization. Discectomy was continued posteriorly thru the disc space. Posterior longitudinal ligament was opened with a nerve hook, and then removed along with disc herniation and osteophytes, decompressing the spinal canal and thecal sac. We then continued to remove osteophytic overgrowth and disc material decompressing the neural foramina and exiting nerve roots bilaterally. The scope was angled up and down to help decompress and undercut the vertebral bodies. Once the decompression was completed we could pass a nerve hook circumferentially to assure adequate decompression in the midline and in the neural foramina. So by both visualization and palpation we felt we had an adequate decompression of the neural elements. We then measured the height of the intravertebral disc space and selected a 8 millimeter peek interbody cage packed with autograft and DBM putty for each level. It was then gently positioned in the intravertebral disc space(s) and countersunk. I then used a Alphatec plate and placed 73mm variable angle screws into the vertebral bodies of each  level and locked them into position. The wound was irrigated with  bacitracin solution, checked for hemostasis which was established and confirmed. Once meticulous hemostasis was achieved, we then proceeded with closure with the assistance of my nurse practitioner. The platysma was closed with interrupted 3-0 undyed Vicryl suture, the subcuticular layer was closed with interrupted 3-0 undyed Vicryl suture. The skin edges were approximated with steristrips. The drapes were removed. A sterile dressing was applied. The patient was then awakened from general anesthesia and transferred to the recovery room in stable condition. At the end of the procedure all sponge, needle and instrument counts were correct.   PLAN OF CARE: Admit for overnight observation  PATIENT DISPOSITION:  PACU - hemodynamically stable.   Delay start of Pharmacological VTE agent (>24hrs) due to surgical blood loss or risk of bleeding:  yes

## 2020-10-16 NOTE — H&P (Signed)
Subjective:   Patient is a 55 y.o. male admitted for neck and arm pain. The patient first presented to me with complaints of neck pain, shooting pains in the arm(s), and numbness of the arm(s). Onset of symptoms was several months ago. The pain is described as aching and stabbing and occurs all day. The pain is rated severe, and is located in the neck and radiates to the arms R >L. The symptoms have been progressive. Symptoms are exacerbated by extending head backwards, and are relieved by none.  Previous work up includes MRI of cervical spine, results: spinal stenosis.  Past Medical History:  Diagnosis Date   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HCC)    Bipolar disorder (HCC)    Complication of anesthesia    Diabetes mellitus without complication (HCC)    Hyperlipidemia    Hypertension    Kidney disease    PONV (postoperative nausea and vomiting)    Sleep apnea    Sleep apnea, unspecified 10/19/2018    Past Surgical History:  Procedure Laterality Date   TONSILLECTOMY      Allergies  Allergen Reactions   Gramineae Pollens    Other Other (See Comments)    Steroids causes agitation and elevated sugar levels   Tramadol     Not very effective    Social History   Tobacco Use   Smoking status: Former    Packs/day: 0.50    Years: 0.50    Pack years: 0.25    Types: Cigarettes    Quit date: 1996    Years since quitting: 26.7   Smokeless tobacco: Former    Types: Snuff  Substance Use Topics   Alcohol use: Not Currently    Family History  Problem Relation Age of Onset   Non-Hodgkin's lymphoma Mother    Diabetes Father    Cancer Father    Thyroid disease Father    Hypertension Father    Hyperlipidemia Father    Bipolar disorder Father    Prior to Admission medications   Medication Sig Start Date End Date Taking? Authorizing Provider  ALPRAZolam (XANAX XR) 1 MG 24 hr tablet Take 3 mg by mouth in the morning. 02/03/19  Yes [provider]  amphetamine-dextroamphetamine (ADDERALL) 20 MG  tablet Take 20 mg by mouth daily in the afternoon.   Yes [provider]  buPROPion (WELLBUTRIN XL) 300 MG 24 hr tablet Take 300 mg by mouth every morning. 01/10/19  Yes [provider]  cycloSPORINE (RESTASIS) 0.05 % ophthalmic emulsion Place 1 drop into both eyes 2 (two) times daily.   Yes [provider]  divalproex (DEPAKOTE ER) 500 MG 24 hr tablet Take 1,500 mg by mouth at bedtime.   Yes [provider]  FLUoxetine (PROZAC) 40 MG capsule Take 80 mg by mouth in the morning.   Yes [provider]  glipiZIDE (GLUCOTROL XL) 5 MG 24 hr tablet Take 1 tablet (5 mg total) by mouth daily with breakfast. Patient taking differently: Take 10 mg by mouth daily with breakfast. 05/31/19  Yes Nida, Denman George, MD  HYDROcodone-acetaminophen (NORCO) 10-325 MG tablet Take 1 tablet by mouth 3 (three) times daily as needed for moderate pain.   Yes [provider]  JARDIANCE 10 MG TABS tablet Take 10 mg by mouth daily. 08/09/20  Yes [provider]  lisdexamfetamine (VYVANSE) 50 MG capsule Take 50 mg by mouth in the morning.   Yes [provider]  losartan (COZAAR) 50 MG tablet Take 50 mg by mouth daily.  Yes [provider]  metFORMIN (GLUCOPHAGE) 500 MG tablet Take 2,000 mg by mouth daily with breakfast.   Yes [provider]  pregabalin (LYRICA) 150 MG capsule Take 100 mg by mouth in the morning.   Yes [provider]  rosuvastatin (CRESTOR) 20 MG tablet Take 20 mg by mouth daily.   Yes [provider]  tamsulosin (FLOMAX) 0.4 MG CAPS capsule Take 0.4 mg by mouth in the morning. 09/20/20  Yes [provider]  TRULICITY 3 MG/0.5ML SOPN Inject 3 mg into the skin once a week. 08/08/20  Yes [provider]  zolpidem (AMBIEN) 10 MG tablet TAKE 1 TABLET(10 MG) BY MOUTH AT BEDTIME 08/25/20  Yes Olalere, Adewale A, MD  prazosin (MINIPRESS) 1 MG capsule Take 1 mg by mouth at bedtime as needed  (nightmares). 10/02/20   [provider]  TRULICITY 1.5 MG/0.5ML SOPN ADMINISTER 1.5 MG UNDER THE SKIN 1 TIME A WEEK Patient not taking: No sig reported 07/14/19   Roma Kayser, MD     Review of Systems  Positive ROS: neg  All other systems have been reviewed and were otherwise negative with the exception of those mentioned in the HPI and as above.  Objective: Vital signs in last 24 hours: Temp:  [97.8 F (36.6 C)] 97.8 F (36.6 C) (10/03 1034) Pulse Rate:  [100] 100 (10/03 1034) Resp:  [18] 18 (10/03 1034) BP: (189)/(79) 189/79 (10/03 1034) SpO2:  [100 %] 100 % (10/03 1034) Weight:  [77.1 kg] 77.1 kg (10/03 1034)  General Appearance: Alert, cooperative, no distress, appears stated age Head: Normocephalic, without obvious abnormality, atraumatic Eyes: PERRL, conjunctiva/corneas clear, EOM's intact      Neck: Supple, symmetrical, trachea midline, Back: Symmetric, no curvature, ROM normal, no CVA tenderness Lungs:  respirations unlabored Heart: Regular rate and rhythm Abdomen: Soft, non-tender Extremities: Extremities normal, atraumatic, no cyanosis or edema Pulses: 2+ and symmetric all extremities Skin: Skin color, texture, turgor normal, no rashes or lesions  NEUROLOGIC:  Mental status: Alert and oriented x4, no aphasia, good attention span, fund of knowledge and memory  Motor Exam - grossly normal Sensory Exam - grossly normal Reflexes: 2+ Coordination - grossly normal Gait - grossly normal Balance - grossly normal Cranial Nerves: I: smell Not tested  II: visual acuity  OS: nl    OD: nl  II: visual fields Full to confrontation  II: pupils Equal, round, reactive to light  III,VII: ptosis None  III,IV,VI: extraocular muscles  Full ROM  V: mastication Normal  V: facial light touch sensation  Normal  V,VII: corneal reflex  Present  VII: facial muscle function - upper  Normal  VII: facial muscle function - lower Normal  VIII: hearing Not tested  IX:  soft palate elevation  Normal  IX,X: gag reflex Present  XI: trapezius strength  5/5  XI: sternocleidomastoid strength 5/5  XI: neck flexion strength  5/5  XII: tongue strength  Normal    Data Review Lab Results  Component Value Date   HGB 15.0 10/16/2020   HCT 44.0 10/16/2020   Lab Results  Component Value Date   NA 140 10/16/2020   K 4.2 10/16/2020   CL 102 10/16/2020   CO2 26 05/24/2019   BUN 19 10/16/2020   CREATININE 0.80 10/16/2020   GLUCOSE 151 (H) 10/16/2020   Lab Results  Component Value Date   INR 1.0 10/16/2020    Assessment:   Cervical neck pain with herniated nucleus pulposus/ spondylosis/ stenosis at C3-4  C4-5 C5-6. Estimated body mass index is 23.06 kg/m as calculated from the following:   Height as of this encounter: 6' (1.829 m).   Weight as of this encounter: 77.1 kg.  Patient has failed conservative therapy. Planned surgery : ACDF C3-4 c4-5 C5-6  Plan:   I explained the condition and procedure to the patient and answered any questions.  Patient wishes to proceed with procedure as planned. Understands risks/ benefits/ and expected or typical outcomes.  Tia Alert 10/16/2020 1:07 PM ACDF

## 2020-10-16 NOTE — Transfer of Care (Signed)
Immediate Anesthesia Transfer of Care Note  Patient: Edward Garner  Procedure(s) Performed: ACDF - C3-C4 - C4-C5 - C5-C6  Patient Location: PACU  Anesthesia Type:General  Level of Consciousness: awake and alert   Airway & Oxygen Therapy: Patient Spontanous Breathing and Patient connected to face mask oxygen  Post-op Assessment: Report given to RN, Post -op Vital signs reviewed and stable and Patient moving all extremities X 4  Post vital signs: Reviewed and stable  Last Vitals:  Vitals Value Taken Time  BP 150/91 10/16/20 1718  Temp    Pulse 95 10/16/20 1725  Resp 18 10/16/20 1725  SpO2 97 % 10/16/20 1725  Vitals shown include unvalidated device data.  Last Pain:  Vitals:   10/16/20 1046  TempSrc:   PainSc: 0-No pain         Complications: No notable events documented.

## 2020-10-16 NOTE — Anesthesia Procedure Notes (Cosign Needed)
Procedure Name: Intubation Date/Time: 10/16/2020 1:28 PM Performed by: Janice Coffin, RN Pre-anesthesia Checklist: Patient identified, Emergency Drugs available, Suction available and Patient being monitored Patient Re-evaluated:Patient Re-evaluated prior to induction Oxygen Delivery Method: Circle system utilized Preoxygenation: Pre-oxygenation with 100% oxygen Induction Type: IV induction Ventilation: Mask ventilation without difficulty Laryngoscope Size: Glidescope and 3 Grade View: Grade I Tube type: Oral Tube size: 7.5 mm Number of attempts: 1 Airway Equipment and Method: Stylet and Video-laryngoscopy Placement Confirmation: ETT inserted through vocal cords under direct vision, positive ETCO2 and breath sounds checked- equal and bilateral Secured at: 23 cm Tube secured with: Tape Dental Injury: Teeth and Oropharynx as per pre-operative assessment  Comments: Atraumatic placement

## 2020-10-16 NOTE — Progress Notes (Signed)
Orthopedic Tech Progress Note Patient Details:  Edward Garner 16-Mar-1965 [id number]  Ortho Devices Type of Ortho Device: Soft collar Ortho Device/Splint Interventions: Ordered     Dropped off collar with nurse.  Darleen Crocker 10/16/2020, 5:38 PM

## 2020-10-16 NOTE — Anesthesia Preprocedure Evaluation (Signed)
Anesthesia Evaluation  Patient identified by MRN, date of birth, ID band Patient awake    Reviewed: Allergy & Precautions, NPO status , Patient's Chart, lab work & pertinent test results  History of Anesthesia Complications (+) PONV  Airway Mallampati: II  TM Distance: >3 FB     Dental   Pulmonary sleep apnea , former smoker,    breath sounds clear to auscultation       Cardiovascular hypertension,  Rhythm:Regular Rate:Normal     Neuro/Psych Seizures -,     GI/Hepatic negative GI ROS, Neg liver ROS,   Endo/Other  diabetes  Renal/GU Renal disease     Musculoskeletal   Abdominal   Peds  Hematology   Anesthesia Other Findings   Reproductive/Obstetrics                             Anesthesia Physical Anesthesia Plan  ASA: 3  Anesthesia Plan: General   Post-op Pain Management:    Induction: Intravenous  PONV Risk Score and Plan: 3 and Ondansetron, Dexamethasone, Midazolam and Treatment may vary due to age or medical condition  Airway Management Planned: Oral ETT  Additional Equipment:   Intra-op Plan:   Post-operative Plan: Possible Post-op intubation/ventilation  Informed Consent: I have reviewed the patients History and Physical, chart, labs and discussed the procedure including the risks, benefits and alternatives for the proposed anesthesia with the patient or authorized representative who has indicated his/her understanding and acceptance.     Dental advisory given  Plan Discussed with: CRNA and Anesthesiologist  Anesthesia Plan Comments:         Anesthesia Quick Evaluation

## 2020-10-17 ENCOUNTER — Emergency Department (HOSPITAL_COMMUNITY)
Admission: EM | Admit: 2020-10-17 | Discharge: 2020-10-17 | Disposition: A | Discharge status: Home / Self Care | Payer: Medicare Other | Source: Home / Self Care | Attending: Emergency Medicine | Admitting: Emergency Medicine

## 2020-10-17 ENCOUNTER — Other Ambulatory Visit: Payer: Self-pay

## 2020-10-17 ENCOUNTER — Emergency Department (HOSPITAL_COMMUNITY): Payer: Medicare Other

## 2020-10-17 ENCOUNTER — Encounter (HOSPITAL_COMMUNITY): Payer: Self-pay

## 2020-10-17 DIAGNOSIS — R0902 Hypoxemia: Secondary | ICD-10-CM | POA: Diagnosis not present

## 2020-10-17 DIAGNOSIS — Z885 Allergy status to narcotic agent status: Secondary | ICD-10-CM | POA: Diagnosis not present

## 2020-10-17 DIAGNOSIS — J690 Pneumonitis due to inhalation of food and vomit: Secondary | ICD-10-CM | POA: Diagnosis not present

## 2020-10-17 DIAGNOSIS — Z20822 Contact with and (suspected) exposure to covid-19: Secondary | ICD-10-CM | POA: Diagnosis not present

## 2020-10-17 DIAGNOSIS — Z83438 Family history of other disorder of lipoprotein metabolism and other lipidemia: Secondary | ICD-10-CM | POA: Diagnosis not present

## 2020-10-17 DIAGNOSIS — M542 Cervicalgia: Secondary | ICD-10-CM | POA: Insufficient documentation

## 2020-10-17 DIAGNOSIS — S46912A Strain of unspecified muscle, fascia and tendon at shoulder and upper arm level, left arm, initial encounter: Secondary | ICD-10-CM

## 2020-10-17 DIAGNOSIS — M47812 Spondylosis without myelopathy or radiculopathy, cervical region: Secondary | ICD-10-CM | POA: Diagnosis not present

## 2020-10-17 DIAGNOSIS — M4802 Spinal stenosis, cervical region: Secondary | ICD-10-CM | POA: Diagnosis not present

## 2020-10-17 DIAGNOSIS — G4730 Sleep apnea, unspecified: Secondary | ICD-10-CM | POA: Diagnosis not present

## 2020-10-17 DIAGNOSIS — M25512 Pain in left shoulder: Secondary | ICD-10-CM | POA: Insufficient documentation

## 2020-10-17 DIAGNOSIS — S0101XA Laceration without foreign body of scalp, initial encounter: Secondary | ICD-10-CM | POA: Insufficient documentation

## 2020-10-17 DIAGNOSIS — I10 Essential (primary) hypertension: Secondary | ICD-10-CM | POA: Insufficient documentation

## 2020-10-17 DIAGNOSIS — Z794 Long term (current) use of insulin: Secondary | ICD-10-CM | POA: Insufficient documentation

## 2020-10-17 DIAGNOSIS — Z79899 Other long term (current) drug therapy: Secondary | ICD-10-CM | POA: Insufficient documentation

## 2020-10-17 DIAGNOSIS — R55 Syncope and collapse: Secondary | ICD-10-CM

## 2020-10-17 DIAGNOSIS — Z833 Family history of diabetes mellitus: Secondary | ICD-10-CM | POA: Diagnosis not present

## 2020-10-17 DIAGNOSIS — Z7984 Long term (current) use of oral hypoglycemic drugs: Secondary | ICD-10-CM | POA: Insufficient documentation

## 2020-10-17 DIAGNOSIS — W228XXA Striking against or struck by other objects, initial encounter: Secondary | ICD-10-CM | POA: Insufficient documentation

## 2020-10-17 DIAGNOSIS — Z8249 Family history of ischemic heart disease and other diseases of the circulatory system: Secondary | ICD-10-CM | POA: Diagnosis not present

## 2020-10-17 DIAGNOSIS — Z8349 Family history of other endocrine, nutritional and metabolic diseases: Secondary | ICD-10-CM | POA: Diagnosis not present

## 2020-10-17 DIAGNOSIS — E785 Hyperlipidemia, unspecified: Secondary | ICD-10-CM | POA: Diagnosis not present

## 2020-10-17 DIAGNOSIS — R0789 Other chest pain: Secondary | ICD-10-CM | POA: Insufficient documentation

## 2020-10-17 DIAGNOSIS — E119 Type 2 diabetes mellitus without complications: Secondary | ICD-10-CM | POA: Insufficient documentation

## 2020-10-17 DIAGNOSIS — Z87891 Personal history of nicotine dependence: Secondary | ICD-10-CM | POA: Insufficient documentation

## 2020-10-17 HISTORY — DX: Spinal stenosis, site unspecified: M48.00

## 2020-10-17 LAB — CBC WITH DIFFERENTIAL/PLATELET
Abs Immature Granulocytes: 0.04 10*3/uL (ref 0.00–0.07)
Basophils Absolute: 0 10*3/uL (ref 0.0–0.1)
Basophils Relative: 0 %
Eosinophils Absolute: 0 10*3/uL (ref 0.0–0.5)
Eosinophils Relative: 0 %
HCT: 40.6 % (ref 39.0–52.0)
Hemoglobin: 13.3 g/dL (ref 13.0–17.0)
Immature Granulocytes: 0 %
Lymphocytes Relative: 7 %
Lymphs Abs: 0.8 10*3/uL (ref 0.7–4.0)
MCH: 32.6 pg (ref 26.0–34.0)
MCHC: 32.8 g/dL (ref 30.0–36.0)
MCV: 99.5 fL (ref 80.0–100.0)
Monocytes Absolute: 1.7 10*3/uL — ABNORMAL HIGH (ref 0.1–1.0)
Monocytes Relative: 15 %
Neutro Abs: 8.5 10*3/uL — ABNORMAL HIGH (ref 1.7–7.7)
Neutrophils Relative %: 78 %
Platelets: 199 10*3/uL (ref 150–400)
RBC: 4.08 MIL/uL — ABNORMAL LOW (ref 4.22–5.81)
RDW: 12.9 % (ref 11.5–15.5)
WBC: 11 10*3/uL — ABNORMAL HIGH (ref 4.0–10.5)
nRBC: 0 % (ref 0.0–0.2)

## 2020-10-17 LAB — GLUCOSE, CAPILLARY: Glucose-Capillary: 137 mg/dL — ABNORMAL HIGH (ref 70–99)

## 2020-10-17 LAB — D-DIMER, QUANTITATIVE: D-Dimer, Quant: 0.43 ug/mL-FEU (ref 0.00–0.50)

## 2020-10-17 LAB — BASIC METABOLIC PANEL
Anion gap: 9 (ref 5–15)
BUN: 12 mg/dL (ref 6–20)
CO2: 27 mmol/L (ref 22–32)
Calcium: 9.3 mg/dL (ref 8.9–10.3)
Chloride: 105 mmol/L (ref 98–111)
Creatinine, Ser: 0.8 mg/dL (ref 0.61–1.24)
GFR, Estimated: >60 mL/min (ref >60)
Glucose, Bld: 166 mg/dL — ABNORMAL HIGH (ref 70–99)
Potassium: 4.1 mmol/L (ref 3.5–5.1)
Sodium: 141 mmol/L (ref 135–145)

## 2020-10-17 LAB — TROPONIN I (HIGH SENSITIVITY): Troponin I (High Sensitivity): 3 ng/L (ref <18)

## 2020-10-17 MED ORDER — OXYCODONE-ACETAMINOPHEN 10-325 MG PO TABS: 1.0000 | ORAL_TABLET | Freq: Four times a day (QID) | ORAL | 0 refills | Status: DC | PRN

## 2020-10-17 MED ORDER — SODIUM CHLORIDE 0.9 % IV BOLUS
1000.0000 mL | Freq: Once | INTRAVENOUS | Status: AC
  Administered 2020-10-17: 1000 mL via INTRAVENOUS

## 2020-10-17 MED ORDER — CYCLOBENZAPRINE HCL 10 MG PO TABS
10.0000 mg | ORAL_TABLET | Freq: Once | ORAL | Status: AC
  Administered 2020-10-17: 10 mg via ORAL
  Filled 2020-10-17: qty 1

## 2020-10-17 MED ORDER — LIDOCAINE-EPINEPHRINE 2 %-1:100000 IJ SOLN
20.0000 mL | Freq: Once | INTRAMUSCULAR | Status: AC
  Administered 2020-10-17: 20 mL
  Filled 2020-10-17: qty 1

## 2020-10-17 MED ORDER — LORAZEPAM 2 MG/ML IJ SOLN
1.0000 mg | Freq: Once | INTRAMUSCULAR | Status: AC
  Administered 2020-10-17: 1 mg via INTRAVENOUS
  Filled 2020-10-17: qty 1

## 2020-10-17 MED ORDER — HYDROMORPHONE HCL 1 MG/ML IJ SOLN
1.0000 mg | Freq: Once | INTRAMUSCULAR | Status: AC
  Administered 2020-10-17: 1 mg via INTRAVENOUS
  Filled 2020-10-17: qty 1

## 2020-10-17 MED ORDER — METHOCARBAMOL 500 MG PO TABS: 500.0000 mg | ORAL_TABLET | Freq: Four times a day (QID) | ORAL | 0 refills | Status: DC | PRN

## 2020-10-17 MED ORDER — FENTANYL CITRATE PF 50 MCG/ML IJ SOSY
100.0000 ug | PREFILLED_SYRINGE | Freq: Once | INTRAMUSCULAR | Status: AC
  Administered 2020-10-17: 100 ug via INTRAVENOUS
  Filled 2020-10-17: qty 2

## 2020-10-17 MED ORDER — LISDEXAMFETAMINE DIMESYLATE 30 MG PO CAPS
50.0000 mg | ORAL_CAPSULE | Freq: Every day | ORAL | Status: DC
  Administered 2020-10-17: 50 mg via ORAL
  Filled 2020-10-17: qty 1

## 2020-10-17 MED FILL — Thrombin For Soln 5000 Unit: CUTANEOUS | Qty: 5000 | Status: AC

## 2020-10-17 NOTE — Progress Notes (Signed)
Patient alert and oriented, mae's well, voiding adequate amount of urine, swallowing without difficulty,  c/o mild pain at time of discharge. Patient and Mother were comfortable with discharge at this time. Patient discharged home with family. Script and discharged instructions given to patient. Patient and family stated understanding of instructions given. Patient has an appointment with Dr. [name] in 2 weeks

## 2020-10-17 NOTE — Discharge Instructions (Signed)
Continue your previously prescribed pain medicine at home.  Follow-up both with your primary care doctor and with your surgeon.  The staples will need to be removed in approximately 7 days.  Your primary care doctor can do this or he can come back here.  If you have any further episodes of passing out, difficulty breathing, chest pain or other new concerning symptom, please return to ER for reassessment.

## 2020-10-17 NOTE — Discharge Summary (Signed)
Physician Discharge Summary  Patient ID: Edward Garner MRN: [id number] DOB/AGE: 04/07/1965 55 y.o.  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 The patient tolerated the procedure well and was taken to the recovery room and then to the floor in stable condition. The hospital course was routine. There were no complications. The wound remained clean dry and intact. Pt had appropriate neck soreness. No complaints of arm pain or new N/T/W. The patient remained afebrile with stable vital signs, and tolerated a regular diet. The patient continued to increase activities, and pain was well controlled with oral pain medications.   Consults: None  Significant Diagnostic Studies:  Results for orders placed or performed during the hospital encounter of 10/16/20  SARS Coronavirus 2 by RT PCR (hospital order, performed in [facility] hospital lab) Nasopharyngeal Nasopharyngeal Swab   Specimen: Nasopharyngeal Swab  Result Value Ref Range   SARS Coronavirus 2 NEGATIVE NEGATIVE  Surgical pcr screen   Specimen: Nasal Mucosa; Nasal Swab  Result Value Ref Range   MRSA, PCR NEGATIVE NEGATIVE   Staphylococcus aureus NEGATIVE NEGATIVE  Protime-INR  Result Value Ref Range   Prothrombin Time 13.3 11.4 - 15.2 seconds   INR 1.0 0.8 - 1.2  Glucose, capillary  Result Value Ref Range   Glucose-Capillary 146 (H) 70 - 99 mg/dL  Glucose, capillary  Result Value Ref Range   Glucose-Capillary 137 (H) 70 - 99 mg/dL  Hemoglobin L3Y  Result Value Ref Range   Hgb A1c MFr Bld 8.0 (H) 4.8 - 5.6 %   Mean Plasma Glucose 182.9 mg/dL  Glucose, capillary  Result Value Ref Range    Glucose-Capillary 186 (H) 70 - 99 mg/dL   Comment 1 Notify RN    Comment 2 Document in Chart   Glucose, capillary  Result Value Ref Range   Glucose-Capillary 137 (H) 70 - 99 mg/dL   Comment 1 Notify RN    Comment 2 Document in Chart   I-STAT, chem 8  Result Value Ref Range   Sodium 140 135 - 145 mmol/L   Potassium 4.2 3.5 - 5.1 mmol/L   Chloride 102 98 - 111 mmol/L   BUN 19 6 - 20 mg/dL   Creatinine, Ser 1.01 0.61 - 1.24 mg/dL   Glucose, Bld 751 (H) 70 - 99 mg/dL   Calcium, Ion 0.25 8.52 - 1.40 mmol/L   TCO2 27 22 - 32 mmol/L   Hemoglobin 15.0 13.0 - 17.0 g/dL   HCT 77.8 24.2 - 35.3 %  Type and screen [facility]  Result Value Ref Range   ABO/RH(D) O POS    Antibody Screen NEG    Sample Expiration      10/19/2020,2359 Performed at [facility], [address] [place], [place] [address]   ABO/Rh  Result Value Ref Range   ABO/RH(D)      O POS Performed at [facility], [address], [place], [place] [address]     DG Cervical Spine 1 View  Result Date: 10/16/2020 CLINICAL DATA:  Cervical fusion EXAM: DG CERVICAL SPINE - 1 VIEW COMPARISON:  09/27/2020 FLUOROSCOPY TIME:  Radiation Exposure Index (as provided by the fluoroscopic  device): 1.49 mGy If the device does not provide the exposure index: Fluoroscopy Time:  17 seconds Number of Acquired Images:  1 FINDINGS: Interbody fusion is noted at C3-4, C4-5 and C5-6 with anterior fixation at all 3 levels. No alignment abnormality is noted. IMPRESSION: Cervical fusion from C3-C6. Electronically Signed   By: Alcide Clever M.D.   On: 10/16/2020 19:29   DG C-Arm 1-60 Min-No Report  Result Date: 10/16/2020 Fluoroscopy was utilized by the requesting physician.  No radiographic interpretation.   DG C-Arm 1-60 Min-No Report  Result Date: 10/16/2020 Fluoroscopy was utilized by the requesting physician.  No radiographic interpretation.    Antibiotics:  Anti-infectives (From admission, onward)    Start      Dose/Rate Route Frequency Ordered Stop   10/16/20 2130  ceFAZolin (ANCEF) IVPB 2g/100 mL premix        2 g 200 mL/hr over 30 Minutes Intravenous Every 8 hours 10/16/20 1940 10/17/20 0600   10/16/20 1030  ceFAZolin (ANCEF) IVPB 2g/100 mL premix        2 g 200 mL/hr over 30 Minutes Intravenous On call to O.R. 10/16/20 1024 10/16/20 1328       Discharge Exam: Blood pressure (!) 159/110, pulse 94, temperature 98.1 F (36.7 C), temperature source Oral, resp. rate 18, height 6' (1.829 m), weight 77.1 kg, SpO2 100 %. Neurologic: Grossly normal Ambulating and voiding well, incision cdi   Discharge Medications:   Allergies as of 10/17/2020       Reactions   Gramineae Pollens    Other Other (See Comments)   Steroids causes agitation and elevated sugar levels   Tramadol    Not very effective        Medication List     STOP taking these medications    HYDROcodone-acetaminophen 10-325 MG tablet Commonly known as: NORCO       TAKE these medications    ALPRAZolam 1 MG 24 hr tablet Commonly known as: XANAX XR Take 3 mg by mouth in the morning.   amphetamine-dextroamphetamine 20 MG tablet Commonly known as: ADDERALL Take 20 mg by mouth daily in the afternoon.   buPROPion 300 MG 24 hr tablet Commonly known as: WELLBUTRIN XL Take 300 mg by mouth every morning.   cycloSPORINE 0.05 % ophthalmic emulsion Commonly known as: RESTASIS Place 1 drop into both eyes 2 (two) times daily.   divalproex 500 MG 24 hr tablet Commonly known as: DEPAKOTE ER Take 1,500 mg by mouth at bedtime.   FLUoxetine 40 MG capsule Commonly known as: PROZAC Take 80 mg by mouth in the morning.   glipiZIDE 5 MG 24 hr tablet Commonly known as: GLUCOTROL XL Take 1 tablet (5 mg total) by mouth daily with breakfast. What changed: how much to take   Jardiance 10 MG Tabs tablet Generic drug: empagliflozin Take 10 mg by mouth daily.   lisdexamfetamine 50 MG capsule Commonly known as: VYVANSE Take  50 mg by mouth in the morning.   losartan 50 MG tablet Commonly known as: COZAAR Take 50 mg by mouth daily.   metFORMIN 500 MG tablet Commonly known as: GLUCOPHAGE Take 2,000 mg by mouth daily with breakfast.   methocarbamol 500 MG tablet Commonly known as: ROBAXIN Take 1 tablet (500 mg total) by mouth every 6 (six) hours as needed for muscle spasms.   oxyCODONE-acetaminophen 10-325 MG tablet Commonly known as: Percocet Take 1 tablet by mouth every 6 (six) hours as needed for pain.   prazosin 1 MG capsule  Commonly known as: MINIPRESS Take 1 mg by mouth at bedtime as needed (nightmares).   pregabalin 150 MG capsule Commonly known as: LYRICA Take 100 mg by mouth in the morning.   rosuvastatin 20 MG tablet Commonly known as: CRESTOR Take 20 mg by mouth daily.   tamsulosin 0.4 MG Caps capsule Commonly known as: FLOMAX Take 0.4 mg by mouth in the morning.   Trulicity 1.5 MG/0.5ML Sopn Generic drug: Dulaglutide ADMINISTER 1.5 MG UNDER THE SKIN 1 TIME A WEEK   Trulicity 3 MG/0.5ML Sopn Generic drug: Dulaglutide Inject 3 mg into the skin once a week.   zolpidem 10 MG tablet Commonly known as: AMBIEN TAKE 1 TABLET(10 MG) BY MOUTH AT BEDTIME        Disposition: home   Final Dx: acdf C3-4, c4-5, c5-6  Discharge Instructions      Remove dressing in 72 hours   Complete by: As directed    Call MD for:  difficulty breathing, headache or visual disturbances   Complete by: As directed    Call MD for:  hives   Complete by: As directed    Call MD for:  persistant dizziness or light-headedness   Complete by: As directed    Call MD for:  persistant nausea and vomiting   Complete by: As directed    Call MD for:  redness, tenderness, or signs of infection (pain, swelling, redness, odor or green/yellow discharge around incision site)   Complete by: As directed    Call MD for:  severe uncontrolled pain   Complete by: As directed    Call MD for:  temperature >100.4    Complete by: As directed    Diet - low sodium heart healthy   Complete by: As directed    Increase activity slowly   Complete by: As directed    Lifting restrictions   Complete by: As directed    No lifting more than 8 lbs          Signed: Tiana Loft Azhar Knope 10/17/2020, 7:55 AM

## 2020-10-17 NOTE — ED Notes (Signed)
Patient transported to CT 
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
## 2020-10-17 NOTE — Anesthesia Postprocedure Evaluation (Signed)
Anesthesia Post Note  Patient: Edward Garner  Procedure(s) Performed: ACDF - C3-C4 - C4-C5 - C5-C6     Patient location during evaluation: PACU Anesthesia Type: General Level of consciousness: awake and alert Pain management: pain level controlled Vital Signs Assessment: post-procedure vital signs reviewed and stable Respiratory status: spontaneous breathing, nonlabored ventilation, respiratory function stable and patient connected to nasal cannula oxygen Cardiovascular status: blood pressure returned to baseline and stable Postop Assessment: no apparent nausea or vomiting Anesthetic complications: no   No notable events documented.  Last Vitals:  Vitals:   10/17/20 0729 10/17/20 0853  BP: (!) 159/110 (!) 149/82  Pulse: 94 92  Resp: 18 20  Temp: 36.7 C 36.9 C  SpO2: 100% 97%    Last Pain:  Vitals:   10/17/20 0853  TempSrc: Oral  PainSc:                  Kennieth Rad
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
## 2020-10-17 NOTE — Discharge Instructions (Signed)
Wound Care Keep incision covered and dry for 3 days    You may remove outer bandage after 3 days. Do not put any creams, lotions, or ointments on incision. Leave steri-strips on neck.  They will fall off by themselves. Activity Walk each and every day, increasing distance each day. No lifting greater than 8 lbs.  Avoid excessive neck motion. No driving for 2 weeks; may ride as a passenger locally.  Diet Resume your normal soft diet.   Call Your Doctor If Any of These Occur Redness, drainage, or swelling at the wound.  Temperature greater than 101 degrees. Severe pain not relieved by pain medication. Increased difficulty swallowing.  Incision starts to come apart.  Follow Up Appt Call [phone]) for problems.  If you have any hardware placed in your spine, you will need an x-ray before your appointment.

## 2020-10-17 NOTE — ED Provider Notes (Signed)
[facility]-EMERGENCY DEPT Provider Note   CSN: [id number] Arrival date & time: 10/17/20  1532     History No chief complaint on file.   Edward Garner is a 55 y.o. male.  Presents to the emergency room with concern for syncopal episode.  Yesterday he had cervical spine surgery.  Elective.  Cervical spinal fusion.  Patient reports no known complications with surgery.  Was recovering at home today when he had an episode of feeling lightheaded and passed out after standing.  States that when he passed out he hit his head on a column and caused a laceration.  No ongoing bleeding.  Also states that he landed on the left side of his body, having some pain in his left shoulder and left chest wall.  Pain is aching, worse with movement, improved with rest.  No difficulty breathing.  He is also having some pain in his neck.  No abdominal pain hip pain or leg pain.  HPI     Past Medical History:  Diagnosis Date   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HCC)    Bipolar disorder (HCC)    Complication of anesthesia    Diabetes mellitus without complication (HCC)    Hyperlipidemia    Hypertension    Kidney disease    PONV (postoperative nausea and vomiting)    Sleep apnea    Sleep apnea, unspecified 10/19/2018   Spinal stenosis     Patient Active Problem List   Diagnosis Date Noted   S/P cervical spinal fusion 10/16/2020   Uncontrolled type 2 diabetes mellitus with hyperglycemia (HCC) 03/01/2019   Essential hypertension, benign 03/01/2019   Mixed hyperlipidemia 03/01/2019   Sleep apnea, unspecified 10/19/2018   Seizure disorder (HCC) 09/28/2018    Past Surgical History:  Procedure Laterality Date   ANTERIOR CERVICAL DECOMP/DISCECTOMY FUSION N/A 10/16/2020   Procedure: ACDF - C3-C4 - C4-C5 - C5-C6;  Surgeon: Tia Alert, MD;  Location: [facility] OR;  Service: Neurosurgery;  Laterality: N/A;   TONSILLECTOMY         Family History  Problem Relation Age of Onset   Non-Hodgkin's  lymphoma Mother    Diabetes Father    Cancer Father    Thyroid disease Father    Hypertension Father    Hyperlipidemia Father    Bipolar disorder Father     Social History   Tobacco Use   Smoking status: Former    Packs/day: 0.50    Years: 0.50    Pack years: 0.25    Types: Cigarettes    Quit date: 1996    Years since quitting: 26.7   Smokeless tobacco: Former    Types: Snuff  Vaping Use   Vaping Use: Never used  Substance Use Topics   Alcohol use: Not Currently   Drug use: Never    Home Medications Prior to Admission medications   Medication Sig Start Date End Date Taking? Authorizing Provider  ALPRAZolam (XANAX XR) 1 MG 24 hr tablet Take 3 mg by mouth in the morning. 02/03/19  Yes [provider]  amphetamine-dextroamphetamine (ADDERALL) 20 MG tablet Take 20 mg by mouth daily in the afternoon.   Yes [provider]  buPROPion (WELLBUTRIN XL) 300 MG 24 hr tablet Take 300 mg by mouth every morning. 01/10/19  Yes [provider]  cycloSPORINE (RESTASIS) 0.05 % ophthalmic emulsion Place 1 drop into both eyes 2 (two) times daily.   Yes [provider]  divalproex (DEPAKOTE ER) 500 MG 24 hr tablet Take 1,500  mg by mouth at bedtime.   Yes [provider]  FLUoxetine (PROZAC) 40 MG capsule Take 80 mg by mouth in the morning.   Yes [provider]  glipiZIDE (GLUCOTROL XL) 5 MG 24 hr tablet Take 1 tablet (5 mg total) by mouth daily with breakfast. Patient taking differently: Take 10 mg by mouth daily with breakfast. 05/31/19  Yes Nida, Denman George, MD  JARDIANCE 10 MG TABS tablet Take 10 mg by mouth daily. 08/09/20  Yes [provider]  lisdexamfetamine (VYVANSE) 50 MG capsule Take 50 mg by mouth in the morning.   Yes [provider]  losartan (COZAAR) 50 MG tablet Take 50 mg by mouth daily.   Yes [provider]  metFORMIN (GLUCOPHAGE) 500 MG tablet Take 2,000 mg by mouth daily with breakfast.   Yes  [provider]  methocarbamol (ROBAXIN) 500 MG tablet Take 1 tablet (500 mg total) by mouth every 6 (six) hours as needed for muscle spasms. 10/17/20  Yes Meyran, Tiana Loft, NP  oxyCODONE-acetaminophen (PERCOCET) 10-325 MG tablet Take 1 tablet by mouth every 6 (six) hours as needed for pain. 10/17/20 10/17/21 Yes Meyran, Tiana Loft, NP  prazosin (MINIPRESS) 1 MG capsule Take 1 mg by mouth at bedtime as needed (nightmares). 10/02/20  Yes [provider]  pregabalin (LYRICA) 150 MG capsule Take 100 mg by mouth in the morning.   Yes [provider]  rosuvastatin (CRESTOR) 20 MG tablet Take 20 mg by mouth daily.   Yes [provider]  tamsulosin (FLOMAX) 0.4 MG CAPS capsule Take 0.4 mg by mouth in the morning. 09/20/20  Yes [provider]  TRULICITY 3 MG/0.5ML SOPN Inject 3 mg into the skin once a week. 08/08/20  Yes [provider]  zolpidem (AMBIEN) 10 MG tablet TAKE 1 TABLET(10 MG) BY MOUTH AT BEDTIME Patient taking differently: Take 10 mg by mouth at bedtime. 08/25/20  Yes Olalere, Adewale A, MD  TRULICITY 1.5 MG/0.5ML SOPN ADMINISTER 1.5 MG UNDER THE SKIN 1 TIME A WEEK Patient not taking: No sig reported 07/14/19   Roma Kayser, MD    Allergies    Gramineae pollens, Other, and Tramadol  Review of Systems   Review of Systems  Constitutional:  Negative for chills and fever.  HENT:  Negative for ear pain and sore throat.   Eyes:  Negative for pain and visual disturbance.  Respiratory:  Negative for cough and shortness of breath.   Cardiovascular:  Negative for chest pain and palpitations.  Gastrointestinal:  Negative for abdominal pain and vomiting.  Genitourinary:  Negative for dysuria and hematuria.  Musculoskeletal:  Positive for arthralgias and neck pain. Negative for back pain.  Skin:  Negative for color change and rash.  Neurological:  Negative for seizures and syncope.  All other systems reviewed and are  negative.  Physical Exam Updated Vital Signs BP 106/60   Pulse (!) 114   Temp 97.6 F (36.4 C) (Oral)   Resp 17   Ht 6' (1.829 m)   Wt 77.1 kg   SpO2 91%   BMI 23.06 kg/m   Physical Exam Vitals and nursing note reviewed.  Constitutional:      Appearance: He is well-developed.  HENT:     Head: Normocephalic.     Comments: 3cm scalp laceration, no active bleeding Eyes:     Conjunctiva/sclera: Conjunctivae normal.  Neck:     Comments: c collar intact Cardiovascular:     Rate and Rhythm: Normal rate and  regular rhythm.     Heart sounds: No murmur heard. Pulmonary:     Effort: Pulmonary effort is normal. No respiratory distress.     Breath sounds: Normal breath sounds.     Comments: L chest wall TTP, no crepitus Abdominal:     Palpations: Abdomen is soft.     Tenderness: There is no abdominal tenderness.  Musculoskeletal:     Cervical back: Neck supple.     Comments: Back: no T, L spine TTP, no step off or deformity RUE: no TTP throughout, no deformity, normal joint ROM, radial pulse intact, distal sensation and motor intact LUE: TTP in L shoulder, no deformity, normal joint ROM, radial pulse intact, distal sensation and motor intact RLE:  no TTP throughout, no deformity, normal joint ROM, distal pulse, sensation and motor intact LLE: no TTP throughout, no deformity, normal joint ROM, distal pulse, sensation and motor intact  Skin:    General: Skin is warm and dry.  Neurological:     Mental Status: He is alert.    ED Results / Procedures / Treatments   Labs (all labs ordered are listed, but only abnormal results are displayed) Labs Reviewed  CBC WITH DIFFERENTIAL/PLATELET - Abnormal; Notable for the following components:      Result Value   WBC 11.0 (*)    RBC 4.08 (*)    Neutro Abs 8.5 (*)    Monocytes Absolute 1.7 (*)    All other components within normal limits  BASIC METABOLIC PANEL - Abnormal; Notable for the following components:   Glucose, Bld 166 (*)     All other components within normal limits  D-DIMER, QUANTITATIVE  TROPONIN I (HIGH SENSITIVITY)  TROPONIN I (HIGH SENSITIVITY)    EKG EKG Interpretation  Date/Time:  Tuesday October 17 2020 15:48:40 EDT Ventricular Rate:  108 PR Interval:  151 QRS Duration: 97 QT Interval:  335 QTC Calculation: 449 R Axis:   65 Text Interpretation: Sinus tachycardia Atrial premature complexes Anteroseptal infarct, old Borderline ST depression, diffuse leads Confirmed by Marianna Fuss ([id number]) on 10/17/2020 10:23:07 PM  Radiology DG Ribs Unilateral W/Chest Left  Result Date: 10/17/2020 CLINICAL DATA:  Left-sided rib pain after fall. EXAM: LEFT RIBS AND CHEST - 3+ VIEW COMPARISON:  None. FINDINGS: No fracture or other bone lesions are seen involving the ribs. There is no evidence of pneumothorax or pleural effusion. Both lungs are clear. Heart size and mediastinal contours are within normal limits. Multiple calcified intra-articular bodies in the left shoulder and elbow. IMPRESSION: 1. No acute rib fracture. 2. No acute cardiopulmonary disease. 3. 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 Electronically Signed   By: Obie Dredge M.D.   On: 10/17/2020 18:05   DG Cervical Spine 1 View  Result Date: 10/16/2020 CLINICAL DATA:  Cervical fusion EXAM: DG CERVICAL SPINE - 1 VIEW COMPARISON:  09/27/2020 FLUOROSCOPY TIME:  Radiation Exposure Index (as provided by the fluoroscopic device): 1.49 mGy If the device does not provide the exposure index: Fluoroscopy Time:  17 seconds Number of Acquired Images:  1 FINDINGS: Interbody fusion is noted at C3-4, C4-5 and C5-6 with anterior fixation at all 3 levels. No alignment abnormality is noted. IMPRESSION: Cervical fusion from C3-C6. Electronically Signed   By: Alcide Clever M.D.   On: 10/16/2020 19:29   CT HEAD WO CONTRAST ( )  Result Date: 10/17/2020 CLINICAL DATA:  Head trauma.   Recent cervical fusion yesterday. EXAM: CT HEAD WITHOUT CONTRAST CT CERVICAL  SPINE WITHOUT CONTRAST TECHNIQUE: Multidetector CT imaging of the head and cervical spine was performed following the standard protocol without intravenous contrast. Multiplanar CT image reconstructions of the cervical spine were also generated. COMPARISON:  Cervical spine x-ray 10/16/2020. FINDINGS: CT HEAD FINDINGS Brain: No evidence of acute infarction, hemorrhage, hydrocephalus, extra-axial collection or mass lesion/mass effect. Vascular: No hyperdense vessel or unexpected calcification. Skull: Normal. Negative for fracture or focal lesion. Sinuses/Orbits: No acute finding. Other: Posterior scalp laceration.  No foreign body. CT CERVICAL SPINE FINDINGS Alignment: Normal. Skull base and vertebrae: No acute fracture. No primary bone lesion or focal pathologic process. Anterior plate and vertebral body screws are seen at C2, C3, C4 and C5. Disc spaces are seen at the surgical levels. Soft tissues and spinal canal: There is some prevertebral soft tissue edema and air as well as air and edema in the right neck and upper mediastinum compatible with recent surgery. No obvious focal fluid collection identified. Central spinal canal soft tissues within normal limits. Disc levels: There is facet arthropathy at multiple levels. Disc space osteophytes are noted at multiple levels. There is disc space narrowing it C6-C7. There is severe neural foraminal stenosis on the left at C4-C5 secondary to facet arthropathy and uncovertebral spurring. There is severe neural foraminal stenosis on the right at C3-C4 secondary to uncovertebral spurring. There is no significant central canal stenosis. Upper chest: Negative. Other: None. IMPRESSION: 1.  No acute intracranial process. 2. No acute fracture or traumatic subluxation of the cervical spine. 3. Anterior fusion C2-C5 with prevertebral swelling and air as well as edema and air in the right neck compatible  with recent surgery. No fluid collection identified. Electronically Signed   By: Darliss Cheney M.D.   On: 10/17/2020 17:07   CT Cervical Spine Wo Contrast  Result Date: 10/17/2020 CLINICAL DATA:  Head trauma.  Recent cervical fusion yesterday. EXAM: CT HEAD WITHOUT CONTRAST CT CERVICAL SPINE WITHOUT CONTRAST TECHNIQUE: Multidetector CT imaging of the head and cervical spine was performed following the standard protocol without intravenous contrast. Multiplanar CT image reconstructions of the cervical spine were also generated. COMPARISON:  Cervical spine x-ray 10/16/2020. FINDINGS: CT HEAD FINDINGS Brain: No evidence of acute infarction, hemorrhage, hydrocephalus, extra-axial collection or mass lesion/mass effect. Vascular: No hyperdense vessel or unexpected calcification. Skull: Normal. Negative for fracture or focal lesion. Sinuses/Orbits: No acute finding. Other: Posterior scalp laceration.  No foreign body. CT CERVICAL SPINE FINDINGS Alignment: Normal. Skull base and vertebrae: No acute fracture. No primary bone lesion or focal pathologic process. Anterior plate and vertebral body screws are seen at C2, C3, C4 and C5. Disc spaces are seen at the surgical levels. Soft tissues and spinal canal: There is some prevertebral soft tissue edema and air as well as air and edema in the right neck and upper mediastinum compatible with recent surgery. No obvious focal fluid collection identified. Central spinal canal soft tissues within normal limits. Disc levels: There is facet arthropathy at multiple levels. Disc space osteophytes are noted at multiple levels. There is disc space narrowing it C6-C7. There is severe neural foraminal stenosis on the left at C4-C5 secondary to facet arthropathy and uncovertebral spurring. There is severe neural foraminal stenosis on the right at C3-C4 secondary to uncovertebral spurring. There is no significant central canal stenosis. Upper chest: Negative. Other: None. IMPRESSION: 1.   No acute intracranial process. 2. No acute fracture or traumatic subluxation of the cervical spine. 3. Anterior fusion C2-C5 with prevertebral swelling and air as well  as edema and air in the right neck compatible with recent surgery. No fluid collection identified. Electronically Signed   By: Darliss Cheney M.D.   On: 10/17/2020 17:07   DG Shoulder Left  Result Date: 10/17/2020 CLINICAL DATA:  Left shoulder pain after fall. EXAM: LEFT SHOULDER - 2+ VIEW COMPARISON:  Left shoulder x-rays dated November 04, 2019. FINDINGS: No acute fracture or dislocation. Progressive severe glenohumeral joint space narrowing, now with bone-on-bone apposition. Multiple calcified intra-articular bodies again noted. 4.5 cm lucent lesion in the subscapularis recess corresponds to a lipoma seen on prior MRI. Soft tissues are unremarkable. IMPRESSION: 1. No acute osseous abnormality. 2. 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 Electronically Signed   By: Obie Dredge M.D.   On: 10/17/2020 18:07   DG C-Arm 1-60 Min-No Report  Result Date: 10/16/2020 Fluoroscopy was utilized by the requesting physician.  No radiographic interpretation.   DG C-Arm 1-60 Min-No Report  Result Date: 10/16/2020 Fluoroscopy was utilized by the requesting physician.  No radiographic interpretation.    Procedures .Marland KitchenLaceration Repair  Date/Time: 10/17/2020 11:20 PM Performed by: Milagros Loll, MD Authorized by: Milagros Loll, MD   Consent:    Consent obtained:  Verbal   Alternatives discussed:  No treatment, delayed treatment, observation and referral Universal protocol:    Immediately prior to procedure, a time out was called: no     Patient identity confirmed:  Verbally with patient Anesthesia:    Anesthesia method:  Local infiltration   Local anesthetic:  Lidocaine 2% WITH epi Laceration details:    Location:  Scalp   Scalp location:  Crown   Length (cm):   3 Treatment:    Area cleansed with:  Saline   Amount of cleaning:  Extensive   Irrigation solution:  Sterile saline   Irrigation method:  Syringe Skin repair:    Repair method:  Staples   Number of staples:  4 Approximation:    Approximation:  Close Repair type:    Repair type:  Simple Post-procedure details:    Procedure completion:  Tolerated well, no immediate complications   Medications Ordered in ED Medications  sodium chloride 0.9 % bolus 1,000 mL (0 mLs Intravenous Stopped 10/17/20 1900)  lidocaine-EPINEPHrine (XYLOCAINE W/EPI) 2 %-1:100000 (with pres) injection 20 mL (20 mLs Infiltration Given by Other 10/17/20 1900)  fentaNYL (SUBLIMAZE) injection 100 mcg (100 mcg Intravenous Given 10/17/20 1648)  HYDROmorphone (DILAUDID) injection 1 mg (1 mg Intravenous Given 10/17/20 1823)  HYDROmorphone (DILAUDID) injection 1 mg (1 mg Intravenous Given 10/17/20 2007)  cyclobenzaprine (FLEXERIL) tablet 10 mg (10 mg Oral Given 10/17/20 2005)  sodium chloride 0.9 % bolus 1,000 mL (0 mLs Intravenous Stopped 10/17/20 2130)  HYDROmorphone (DILAUDID) injection 1 mg (1 mg Intravenous Given 10/17/20 2130)  LORazepam (ATIVAN) injection 1 mg (1 mg Intravenous Given 10/17/20 2129)    ED Course  I have reviewed the triage vital signs and the nursing notes.  Pertinent labs & imaging results that were available during my care of the patient were reviewed by me and considered in my medical decision making (see chart for details).    MDM Rules/Calculators/A&P                           55 year old gentleman presents to ER with concern for syncopal episode.  Had cervical spine surgery yesterday.  On assessment, patient noted to have 3 cm scalp laceration.  Also had some tenderness over left  shoulder and left chest but no other signs of obvious trauma on exam.  CT head and C-spine negative for acute traumatic pathology.  Chest and shoulder x-ray negative.  Basic lab work grossly stable.  EKG with normal  intervals, mild sinus tachycardia noted.  Provided patient fluids, pain medication, repaired scalp laceration.  Patient still having significant pain and still mildly tachycardic after initial intervention.  Checked dimer and troponin.  D-dimer within normal limits, doubt PE.  Troponin within normal limits, no chest pain, no dyspnea, doubt ACS.  On final reassessment, patient remains well-appearing in no distress, believe he is appropriate for discharge and outpatient management.  Strongly recommend close follow-up with both primary care doctor and his surgeon.  Reviewed return precautions and discharged.  After the discussed management above, the patient was determined to be safe for discharge.  The patient was in agreement with this plan and all questions regarding their care were answered.  ED return precautions were discussed and the patient will return to the ED with any significant worsening of condition.  Final Clinical Impression(s) / ED Diagnoses Final diagnoses:  Syncope, unspecified syncope type  Laceration of scalp, initial encounter    Rx / DC Orders ED Discharge Orders     None        Milagros Loll, MD 10/17/20 2321

## 2020-10-19 ENCOUNTER — Emergency Department (HOSPITAL_COMMUNITY): Payer: Medicare Other

## 2020-10-19 ENCOUNTER — Other Ambulatory Visit: Payer: Self-pay

## 2020-10-19 ENCOUNTER — Inpatient Hospital Stay (HOSPITAL_COMMUNITY)
Admission: EM | Admit: 2020-10-19 | Discharge: 2020-11-10 | DRG: 177 | Disposition: A | Discharge status: Rehabilitation Facility | Payer: Medicare Other | Attending: Family Medicine | Admitting: Family Medicine

## 2020-10-19 DIAGNOSIS — E440 Moderate protein-calorie malnutrition: Secondary | ICD-10-CM | POA: Diagnosis present

## 2020-10-19 DIAGNOSIS — J690 Pneumonitis due to inhalation of food and vomit: Secondary | ICD-10-CM | POA: Diagnosis present

## 2020-10-19 DIAGNOSIS — J9601 Acute respiratory failure with hypoxia: Secondary | ICD-10-CM

## 2020-10-19 DIAGNOSIS — F411 Generalized anxiety disorder: Secondary | ICD-10-CM | POA: Diagnosis not present

## 2020-10-19 DIAGNOSIS — G4733 Obstructive sleep apnea (adult) (pediatric): Secondary | ICD-10-CM | POA: Diagnosis present

## 2020-10-19 DIAGNOSIS — Z8349 Family history of other endocrine, nutritional and metabolic diseases: Secondary | ICD-10-CM | POA: Diagnosis not present

## 2020-10-19 DIAGNOSIS — R55 Syncope and collapse: Secondary | ICD-10-CM | POA: Diagnosis present

## 2020-10-19 DIAGNOSIS — E782 Mixed hyperlipidemia: Secondary | ICD-10-CM | POA: Diagnosis present

## 2020-10-19 DIAGNOSIS — E11649 Type 2 diabetes mellitus with hypoglycemia without coma: Secondary | ICD-10-CM | POA: Diagnosis present

## 2020-10-19 DIAGNOSIS — Z833 Family history of diabetes mellitus: Secondary | ICD-10-CM

## 2020-10-19 DIAGNOSIS — Z4659 Encounter for fitting and adjustment of other gastrointestinal appliance and device: Secondary | ICD-10-CM

## 2020-10-19 DIAGNOSIS — F17210 Nicotine dependence, cigarettes, uncomplicated: Secondary | ICD-10-CM | POA: Diagnosis present

## 2020-10-19 DIAGNOSIS — R0902 Hypoxemia: Secondary | ICD-10-CM

## 2020-10-19 DIAGNOSIS — F259 Schizoaffective disorder, unspecified: Secondary | ICD-10-CM | POA: Diagnosis present

## 2020-10-19 DIAGNOSIS — I959 Hypotension, unspecified: Secondary | ICD-10-CM | POA: Diagnosis not present

## 2020-10-19 DIAGNOSIS — I10 Essential (primary) hypertension: Secondary | ICD-10-CM | POA: Diagnosis present

## 2020-10-19 DIAGNOSIS — Z20822 Contact with and (suspected) exposure to covid-19: Secondary | ICD-10-CM | POA: Diagnosis present

## 2020-10-19 DIAGNOSIS — E1165 Type 2 diabetes mellitus with hyperglycemia: Secondary | ICD-10-CM | POA: Diagnosis not present

## 2020-10-19 DIAGNOSIS — R45851 Suicidal ideations: Secondary | ICD-10-CM | POA: Diagnosis present

## 2020-10-19 DIAGNOSIS — G40909 Epilepsy, unspecified, not intractable, without status epilepticus: Secondary | ICD-10-CM | POA: Diagnosis present

## 2020-10-19 DIAGNOSIS — Z807 Family history of other malignant neoplasms of lymphoid, hematopoietic and related tissues: Secondary | ICD-10-CM | POA: Diagnosis not present

## 2020-10-19 DIAGNOSIS — Z7984 Long term (current) use of oral hypoglycemic drugs: Secondary | ICD-10-CM

## 2020-10-19 DIAGNOSIS — Z79899 Other long term (current) drug therapy: Secondary | ICD-10-CM

## 2020-10-19 DIAGNOSIS — R339 Retention of urine, unspecified: Secondary | ICD-10-CM | POA: Diagnosis not present

## 2020-10-19 DIAGNOSIS — R1310 Dysphagia, unspecified: Secondary | ICD-10-CM

## 2020-10-19 DIAGNOSIS — F05 Delirium due to known physiological condition: Secondary | ICD-10-CM | POA: Diagnosis not present

## 2020-10-19 DIAGNOSIS — F319 Bipolar disorder, unspecified: Secondary | ICD-10-CM | POA: Diagnosis present

## 2020-10-19 DIAGNOSIS — F909 Attention-deficit hyperactivity disorder, unspecified type: Secondary | ICD-10-CM | POA: Diagnosis present

## 2020-10-19 DIAGNOSIS — E870 Hyperosmolality and hypernatremia: Secondary | ICD-10-CM | POA: Diagnosis not present

## 2020-10-19 DIAGNOSIS — R1312 Dysphagia, oropharyngeal phase: Secondary | ICD-10-CM | POA: Diagnosis present

## 2020-10-19 DIAGNOSIS — J392 Other diseases of pharynx: Secondary | ICD-10-CM | POA: Diagnosis present

## 2020-10-19 DIAGNOSIS — Z8249 Family history of ischemic heart disease and other diseases of the circulatory system: Secondary | ICD-10-CM | POA: Diagnosis not present

## 2020-10-19 DIAGNOSIS — F419 Anxiety disorder, unspecified: Secondary | ICD-10-CM | POA: Diagnosis present

## 2020-10-19 DIAGNOSIS — Z23 Encounter for immunization: Secondary | ICD-10-CM | POA: Diagnosis present

## 2020-10-19 DIAGNOSIS — S0101XA Laceration without foreign body of scalp, initial encounter: Secondary | ICD-10-CM | POA: Diagnosis present

## 2020-10-19 DIAGNOSIS — Z6820 Body mass index (BMI) 20.0-20.9, adult: Secondary | ICD-10-CM

## 2020-10-19 DIAGNOSIS — F3131 Bipolar disorder, current episode depressed, mild: Secondary | ICD-10-CM | POA: Diagnosis not present

## 2020-10-19 DIAGNOSIS — E279 Disorder of adrenal gland, unspecified: Secondary | ICD-10-CM | POA: Diagnosis present

## 2020-10-19 DIAGNOSIS — Z981 Arthrodesis status: Secondary | ICD-10-CM

## 2020-10-19 DIAGNOSIS — R5381 Other malaise: Secondary | ICD-10-CM | POA: Diagnosis not present

## 2020-10-19 DIAGNOSIS — Y92239 Unspecified place in hospital as the place of occurrence of the external cause: Secondary | ICD-10-CM | POA: Diagnosis not present

## 2020-10-19 DIAGNOSIS — Z885 Allergy status to narcotic agent status: Secondary | ICD-10-CM

## 2020-10-19 LAB — COMPREHENSIVE METABOLIC PANEL
ALT: 14 U/L (ref 0–44)
AST: 19 U/L (ref 15–41)
Albumin: 2.7 g/dL — ABNORMAL LOW (ref 3.5–5.0)
Alkaline Phosphatase: 46 U/L (ref 38–126)
Anion gap: 11 (ref 5–15)
BUN: 14 mg/dL (ref 6–20)
CO2: 21 mmol/L — ABNORMAL LOW (ref 22–32)
Calcium: 9.2 mg/dL (ref 8.9–10.3)
Chloride: 111 mmol/L (ref 98–111)
Creatinine, Ser: 1.11 mg/dL (ref 0.61–1.24)
GFR, Estimated: >60 mL/min (ref >60)
Glucose, Bld: 108 mg/dL — ABNORMAL HIGH (ref 70–99)
Potassium: 4.1 mmol/L (ref 3.5–5.1)
Sodium: 143 mmol/L (ref 135–145)
Total Bilirubin: 1 mg/dL (ref 0.3–1.2)
Total Protein: 5.8 g/dL — ABNORMAL LOW (ref 6.5–8.1)

## 2020-10-19 LAB — I-STAT VENOUS BLOOD GAS, ED
Acid-base deficit: 4 mmol/L — ABNORMAL HIGH (ref 0.0–2.0)
Bicarbonate: 22.1 mmol/L (ref 20.0–28.0)
Calcium, Ion: 1.3 mmol/L (ref 1.15–1.40)
HCT: 33 % — ABNORMAL LOW (ref 39.0–52.0)
Hemoglobin: 11.2 g/dL — ABNORMAL LOW (ref 13.0–17.0)
O2 Saturation: 90 %
Potassium: 4.1 mmol/L (ref 3.5–5.1)
Sodium: 145 mmol/L (ref 135–145)
TCO2: 23 mmol/L (ref 22–32)
pCO2, Ven: 43.9 mmHg — ABNORMAL LOW (ref 44.0–60.0)
pH, Ven: 7.309 (ref 7.250–7.430)
pO2, Ven: 64 mmHg — ABNORMAL HIGH (ref 32.0–45.0)

## 2020-10-19 LAB — CBC WITH DIFFERENTIAL/PLATELET
Abs Immature Granulocytes: 0.2 10*3/uL — ABNORMAL HIGH (ref 0.00–0.07)
Band Neutrophils: 38 %
Basophils Absolute: 0 10*3/uL (ref 0.0–0.1)
Basophils Relative: 0 %
Eosinophils Absolute: 0 10*3/uL (ref 0.0–0.5)
Eosinophils Relative: 0 %
HCT: 34.7 % — ABNORMAL LOW (ref 39.0–52.0)
Hemoglobin: 11.4 g/dL — ABNORMAL LOW (ref 13.0–17.0)
Lymphocytes Relative: 22 %
Lymphs Abs: 1.5 10*3/uL (ref 0.7–4.0)
MCH: 32.8 pg (ref 26.0–34.0)
MCHC: 32.9 g/dL (ref 30.0–36.0)
MCV: 99.7 fL (ref 80.0–100.0)
Metamyelocytes Relative: 1 %
Monocytes Absolute: 0.9 10*3/uL (ref 0.1–1.0)
Monocytes Relative: 13 %
Myelocytes: 2 %
Neutro Abs: 4.1 10*3/uL (ref 1.7–7.7)
Neutrophils Relative %: 24 %
Platelets: 183 10*3/uL (ref 150–400)
RBC: 3.48 MIL/uL — ABNORMAL LOW (ref 4.22–5.81)
RDW: 14 % (ref 11.5–15.5)
WBC: 6.6 10*3/uL (ref 4.0–10.5)
nRBC: 0 % (ref 0.0–0.2)

## 2020-10-19 LAB — URINALYSIS, ROUTINE W REFLEX MICROSCOPIC
Bacteria, UA: NONE SEEN
Bilirubin Urine: NEGATIVE
Glucose, UA: >=500 mg/dL — AB
Ketones, ur: 80 mg/dL — AB
Leukocytes,Ua: NEGATIVE
Nitrite: NEGATIVE
Protein, ur: NEGATIVE mg/dL
Specific Gravity, Urine: 1.027 (ref 1.005–1.030)
pH: 5 (ref 5.0–8.0)

## 2020-10-19 LAB — TROPONIN I (HIGH SENSITIVITY)
Troponin I (High Sensitivity): 9 ng/L (ref <18)
Troponin I (High Sensitivity): 8 ng/L (ref <18)

## 2020-10-19 LAB — LACTIC ACID, PLASMA: Lactic Acid, Venous: 1.3 mmol/L (ref 0.5–1.9)

## 2020-10-19 LAB — GLUCOSE, CAPILLARY
Glucose-Capillary: 98 mg/dL (ref 70–99)
Glucose-Capillary: 88 mg/dL (ref 70–99)

## 2020-10-19 LAB — RESP PANEL BY RT-PCR (FLU A&B, COVID) ARPGX2
Influenza A by PCR: NEGATIVE
Influenza B by PCR: NEGATIVE
SARS Coronavirus 2 by RT PCR: NEGATIVE

## 2020-10-19 LAB — BRAIN NATRIURETIC PEPTIDE: B Natriuretic Peptide: 99.4 pg/mL (ref 0.0–100.0)

## 2020-10-19 LAB — VALPROIC ACID LEVEL: Valproic Acid Lvl: <10 ug/mL — ABNORMAL LOW (ref 50.0–100.0)

## 2020-10-19 LAB — HIV ANTIBODY (ROUTINE TESTING W REFLEX): HIV Screen 4th Generation wRfx: NONREACTIVE

## 2020-10-19 MED ORDER — INSULIN ASPART 100 UNIT/ML IJ SOLN
0.0000 [IU] | INTRAMUSCULAR | Status: DC
  Administered 2020-10-20: 2 [IU] via SUBCUTANEOUS
  Administered 2020-10-20: 3 [IU] via SUBCUTANEOUS
  Administered 2020-10-20: 2 [IU] via SUBCUTANEOUS
  Administered 2020-10-21: 3 [IU] via SUBCUTANEOUS
  Administered 2020-10-21 (×2): 5 [IU] via SUBCUTANEOUS
  Administered 2020-10-21: 3 [IU] via SUBCUTANEOUS
  Administered 2020-10-21: 5 [IU] via SUBCUTANEOUS
  Administered 2020-10-22: 2 [IU] via SUBCUTANEOUS
  Administered 2020-10-22 (×2): 5 [IU] via SUBCUTANEOUS
  Administered 2020-10-22: 8 [IU] via SUBCUTANEOUS
  Administered 2020-10-22: 3 [IU] via SUBCUTANEOUS
  Administered 2020-10-22 (×2): 5 [IU] via SUBCUTANEOUS
  Administered 2020-10-23: 8 [IU] via SUBCUTANEOUS
  Administered 2020-10-23 (×2): 5 [IU] via SUBCUTANEOUS
  Administered 2020-10-23: 3 [IU] via SUBCUTANEOUS
  Administered 2020-10-23: 8 [IU] via SUBCUTANEOUS
  Administered 2020-10-23: 11 [IU] via SUBCUTANEOUS
  Administered 2020-10-24: 5 [IU] via SUBCUTANEOUS
  Administered 2020-10-24: 8 [IU] via SUBCUTANEOUS
  Administered 2020-10-24: 11 [IU] via SUBCUTANEOUS
  Administered 2020-10-24: 8 [IU] via SUBCUTANEOUS
  Administered 2020-10-24: 5 [IU] via SUBCUTANEOUS
  Administered 2020-10-24: 15 [IU] via SUBCUTANEOUS
  Administered 2020-10-25: 8 [IU] via SUBCUTANEOUS
  Administered 2020-10-25 (×4): 5 [IU] via SUBCUTANEOUS
  Administered 2020-10-25: 3 [IU] via SUBCUTANEOUS
  Administered 2020-10-26: 8 [IU] via SUBCUTANEOUS
  Administered 2020-10-26: 5 [IU] via SUBCUTANEOUS
  Administered 2020-10-26: 2 [IU] via SUBCUTANEOUS
  Administered 2020-10-26 – 2020-10-27 (×2): 3 [IU] via SUBCUTANEOUS
  Administered 2020-10-27: 2 [IU] via SUBCUTANEOUS
  Administered 2020-10-27 (×2): 3 [IU] via SUBCUTANEOUS
  Administered 2020-10-27: 2 [IU] via SUBCUTANEOUS
  Administered 2020-10-28 (×2): 5 [IU] via SUBCUTANEOUS
  Administered 2020-10-28: 3 [IU] via SUBCUTANEOUS
  Administered 2020-10-28: 2 [IU] via SUBCUTANEOUS
  Administered 2020-10-28 (×2): 3 [IU] via SUBCUTANEOUS
  Administered 2020-10-29: 2 [IU] via SUBCUTANEOUS
  Administered 2020-10-29: 3 [IU] via SUBCUTANEOUS
  Administered 2020-10-29: 5 [IU] via SUBCUTANEOUS
  Administered 2020-10-29: 2 [IU] via SUBCUTANEOUS
  Administered 2020-10-29: 5 [IU] via SUBCUTANEOUS
  Administered 2020-10-30: 2 [IU] via SUBCUTANEOUS
  Administered 2020-10-30 (×3): 3 [IU] via SUBCUTANEOUS
  Administered 2020-10-30: 5 [IU] via SUBCUTANEOUS
  Administered 2020-10-31 (×3): 3 [IU] via SUBCUTANEOUS
  Administered 2020-10-31: 2 [IU] via SUBCUTANEOUS
  Administered 2020-10-31: 5 [IU] via SUBCUTANEOUS
  Administered 2020-10-31: 3 [IU] via SUBCUTANEOUS
  Administered 2020-11-01: 5 [IU] via SUBCUTANEOUS
  Administered 2020-11-01: 3 [IU] via SUBCUTANEOUS
  Administered 2020-11-01: 8 [IU] via SUBCUTANEOUS
  Administered 2020-11-01 – 2020-11-02 (×4): 3 [IU] via SUBCUTANEOUS
  Administered 2020-11-02: 8 [IU] via SUBCUTANEOUS
  Administered 2020-11-02: 5 [IU] via SUBCUTANEOUS
  Administered 2020-11-03: 3 [IU] via SUBCUTANEOUS
  Administered 2020-11-03 (×2): 2 [IU] via SUBCUTANEOUS
  Administered 2020-11-05: 3 [IU] via SUBCUTANEOUS
  Administered 2020-11-05: 2 [IU] via SUBCUTANEOUS
  Administered 2020-11-05 (×2): 3 [IU] via SUBCUTANEOUS
  Administered 2020-11-05: 2 [IU] via SUBCUTANEOUS
  Administered 2020-11-06 (×2): 3 [IU] via SUBCUTANEOUS
  Administered 2020-11-06: 1 [IU] via SUBCUTANEOUS
  Administered 2020-11-06 – 2020-11-07 (×3): 2 [IU] via SUBCUTANEOUS
  Administered 2020-11-07: 3 [IU] via SUBCUTANEOUS
  Administered 2020-11-08: 2 [IU] via SUBCUTANEOUS
  Administered 2020-11-08 – 2020-11-09 (×2): 3 [IU] via SUBCUTANEOUS
  Administered 2020-11-09: 2 [IU] via SUBCUTANEOUS
  Administered 2020-11-10 (×3): 3 [IU] via SUBCUTANEOUS

## 2020-10-19 MED ORDER — POLYETHYLENE GLYCOL 3350 17 G PO PACK: 17.0000 g | PACK | Freq: Every day | ORAL | Status: DC | PRN

## 2020-10-19 MED ORDER — INSULIN ASPART 100 UNIT/ML IJ SOLN: 0.0000 [IU] | Freq: Three times a day (TID) | INTRAMUSCULAR | Status: DC

## 2020-10-19 MED ORDER — LABETALOL HCL 5 MG/ML IV SOLN: 10.0000 mg | INTRAVENOUS | Status: DC | PRN

## 2020-10-19 MED ORDER — LACTATED RINGERS IV BOLUS
500.0000 mL | Freq: Once | INTRAVENOUS | Status: AC
  Administered 2020-10-19: 500 mL via INTRAVENOUS

## 2020-10-19 MED ORDER — ONDANSETRON HCL 4 MG/2ML IJ SOLN: 4.0000 mg | Freq: Four times a day (QID) | INTRAMUSCULAR | Status: DC | PRN

## 2020-10-19 MED ORDER — GUAIFENESIN ER 600 MG PO TB12: 1200.0000 mg | ORAL_TABLET | Freq: Two times a day (BID) | ORAL | Status: DC

## 2020-10-19 MED ORDER — DEXTROSE 10 % IV SOLN: INTRAVENOUS | Status: DC

## 2020-10-19 MED ORDER — VANCOMYCIN HCL IN DEXTROSE 1-5 GM/200ML-% IV SOLN
1000.0000 mg | Freq: Two times a day (BID) | INTRAVENOUS | Status: DC
  Administered 2020-10-20: 1000 mg via INTRAVENOUS
  Filled 2020-10-19: qty 200

## 2020-10-19 MED ORDER — VANCOMYCIN HCL 1500 MG/300ML IV SOLN
1500.0000 mg | Freq: Once | INTRAVENOUS | Status: DC
  Administered 2020-10-19: 1500 mg via INTRAVENOUS
  Filled 2020-10-19: qty 300

## 2020-10-19 MED ORDER — VANCOMYCIN HCL IN DEXTROSE 1-5 GM/200ML-% IV SOLN
1000.0000 mg | Freq: Two times a day (BID) | INTRAVENOUS | Status: DC
  Filled 2020-10-19: qty 200

## 2020-10-19 MED ORDER — VALPROATE SODIUM 100 MG/ML IV SOLN: 500.0000 mg | Freq: Three times a day (TID) | INTRAVENOUS | Status: DC

## 2020-10-19 MED ORDER — IOHEXOL 350 MG/ML SOLN
75.0000 mL | Freq: Once | INTRAVENOUS | Status: AC | PRN
  Administered 2020-10-19: 75 mL via INTRAVENOUS

## 2020-10-19 MED ORDER — SODIUM CHLORIDE 0.9 % IV SOLN
2.0000 g | Freq: Three times a day (TID) | INTRAVENOUS | Status: DC
  Administered 2020-10-19 – 2020-10-20 (×2): 2 g via INTRAVENOUS
  Filled 2020-10-19 (×2): qty 2

## 2020-10-19 MED ORDER — HYDROMORPHONE HCL 1 MG/ML IJ SOLN
0.5000 mg | INTRAMUSCULAR | Status: DC | PRN
  Administered 2020-10-19 – 2020-10-23 (×16): 1 mg via INTRAVENOUS
  Filled 2020-10-19 (×16): qty 1

## 2020-10-19 MED ORDER — DOCUSATE SODIUM 100 MG PO CAPS: 100.0000 mg | ORAL_CAPSULE | Freq: Two times a day (BID) | ORAL | Status: DC | PRN

## 2020-10-19 MED ORDER — HYDRALAZINE HCL 20 MG/ML IJ SOLN: 10.0000 mg | INTRAMUSCULAR | Status: DC | PRN

## 2020-10-19 MED ORDER — HEPARIN SODIUM (PORCINE) 5000 UNIT/ML IJ SOLN: 5000.0000 [IU] | Freq: Three times a day (TID) | INTRAMUSCULAR | Status: DC

## 2020-10-19 MED ORDER — ENOXAPARIN SODIUM 40 MG/0.4ML IJ SOSY
40.0000 mg | PREFILLED_SYRINGE | INTRAMUSCULAR | Status: DC
  Administered 2020-10-19: 40 mg via SUBCUTANEOUS
  Filled 2020-10-19: qty 0.4

## 2020-10-19 MED ORDER — SODIUM CHLORIDE 0.9 % IV SOLN
2.0000 g | Freq: Once | INTRAVENOUS | Status: AC
  Administered 2020-10-19: 2 g via INTRAVENOUS
  Filled 2020-10-19: qty 2

## 2020-10-19 MED ORDER — LACTATED RINGERS IV SOLN: INTRAVENOUS | Status: DC

## 2020-10-19 MED ORDER — SODIUM CHLORIDE 0.9 % IV SOLN: INTRAVENOUS | Status: DC

## 2020-10-19 MED ORDER — IPRATROPIUM-ALBUTEROL 0.5-2.5 (3) MG/3ML IN SOLN: 3.0000 mL | Freq: Four times a day (QID) | RESPIRATORY_TRACT | Status: DC | PRN

## 2020-10-19 MED ORDER — IPRATROPIUM-ALBUTEROL 0.5-2.5 (3) MG/3ML IN SOLN
3.0000 mL | Freq: Four times a day (QID) | RESPIRATORY_TRACT | Status: DC
  Administered 2020-10-19 – 2020-10-20 (×3): 3 mL via RESPIRATORY_TRACT
  Filled 2020-10-19 (×3): qty 3

## 2020-10-19 MED ORDER — SODIUM CHLORIDE 0.9 % IV SOLN
3.0000 g | Freq: Four times a day (QID) | INTRAVENOUS | Status: DC
  Filled 2020-10-19 (×2): qty 8

## 2020-10-19 MED ORDER — SODIUM CHLORIDE 0.9 % IV SOLN: 2.0000 g | Freq: Three times a day (TID) | INTRAVENOUS | Status: DC

## 2020-10-19 MED ORDER — FENTANYL CITRATE PF 50 MCG/ML IJ SOSY
100.0000 ug | PREFILLED_SYRINGE | Freq: Once | INTRAMUSCULAR | Status: AC
  Administered 2020-10-19: 100 ug via INTRAVENOUS
  Filled 2020-10-19: qty 2

## 2020-10-19 MED ORDER — FAMOTIDINE IN NACL 20-0.9 MG/50ML-% IV SOLN
20.0000 mg | Freq: Two times a day (BID) | INTRAVENOUS | Status: DC
  Administered 2020-10-19 – 2020-10-20 (×2): 20 mg via INTRAVENOUS
  Filled 2020-10-19 (×3): qty 50

## 2020-10-19 NOTE — Consult Note (Addendum)
CC: Dyspnea  HPI:     Patient is a 55 y.o. male with severe bipolar disorder and behavioral disturbance, diabetes and sleep apnea underwent a C3-6 ACDF with Dr. [name] 3 days ago.  He developed dysphagia postoperatively and coughing after eating or drinking.  He was brought to the emergency room for increasing shortness of breath and lethargy.  In the ER, he was placed on a BiPAP for hypoxia which improved his saturations.  He has been n.p.o. since this morning.    Patient Active Problem List   Diagnosis Date Noted   Hypoxia 10/19/2020   Acute hypoxemic respiratory failure (HCC) 10/19/2020   S/P cervical spinal fusion 10/16/2020   Uncontrolled type 2 diabetes mellitus with hyperglycemia (HCC) 03/01/2019   Essential hypertension, benign 03/01/2019   Mixed hyperlipidemia 03/01/2019   Sleep apnea, unspecified 10/19/2018   Seizure disorder (HCC) 09/28/2018   Past Medical History:  Diagnosis Date   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HCC)    Bipolar disorder (HCC)    Complication of anesthesia    Diabetes mellitus without complication (HCC)    Hyperlipidemia    Hypertension    Kidney disease    PONV (postoperative nausea and vomiting)    Sleep apnea    Sleep apnea, unspecified 10/19/2018   Spinal stenosis     Past Surgical History:  Procedure Laterality Date   ANTERIOR CERVICAL DECOMP/DISCECTOMY FUSION N/A 10/16/2020   Procedure: ACDF - C3-C4 - C4-C5 - C5-C6;  Surgeon: Tia Alert, MD;  Location: [facility] OR;  Service: Neurosurgery;  Laterality: N/A;   TONSILLECTOMY      (Not in a hospital admission)  Allergies  Allergen Reactions   Gramineae Pollens    Other Other (See Comments)    Steroids causes agitation and elevated sugar levels   Tramadol     Not very effective    Social History   Tobacco Use   Smoking status: Former    Packs/day: 0.50    Years: 0.50    Pack years: 0.25    Types: Cigarettes    Quit date: 1996    Years since quitting: 26.7   Smokeless tobacco: Former    Types:  Snuff  Substance Use Topics   Alcohol use: Not Currently    Family History  Problem Relation Age of Onset   Non-Hodgkin's lymphoma Mother    Diabetes Father    Cancer Father    Thyroid disease Father    Hypertension Father    Hyperlipidemia Father    Bipolar disorder Father      Review of Systems Review of systems not obtained due to patient factors.  Objective:   Patient Vitals for the past 8 hrs:  BP Temp Temp src Pulse Resp SpO2  10/19/20 1930 (!) 145/84 -- -- (!) 110 19 92 %  10/19/20 1915 -- -- -- (!) 110 20 92 %  10/19/20 1905 -- -- -- -- -- 94 %  10/19/20 1900 (!) 142/86 -- -- (!) 112 (!) 26 91 %  10/19/20 1845 -- -- -- (!) 109 (!) 24 93 %  10/19/20 1831 -- -- -- (!) 108 18 95 %  10/19/20 1830 (!) 144/87 -- -- (!) 108 19 93 %  10/19/20 1800 (!) 145/83 -- -- (!) 109 (!) 31 94 %  10/19/20 1734 (!) 156/92 -- -- (!) 111 (!) 26 96 %  10/19/20 1600 (!) 146/74 -- -- (!) 103 (!) 26 96 %  10/19/20 1530 126/79 -- -- 100 (!) 21 97 %  10/19/20  1500 112/69 -- -- 98 (!) 26 97 %  10/19/20 1430 114/76 -- -- 96 (!) 27 95 %  10/19/20 1415 117/71 -- -- 95 (!) 28 96 %  10/19/20 1345 111/72 -- -- 95 (!) 30 96 %  10/19/20 1343 -- -- -- 95 (!) 26 95 %  10/19/20 1336 -- -- -- 97 19 (!) 87 %  10/19/20 1335 -- -- -- 66 (!) 31 (!) 76 %  10/19/20 1330 99/72 -- -- 97 18 93 %  10/19/20 1316 -- -- -- 98 (!) 22 92 %  10/19/20 1315 -- 98.2 F (36.8 C) Oral -- -- --  10/19/20 1315 105/72 -- -- 98 (!) 23 91 %  10/19/20 1309 -- -- -- 97 (!) 21 100 %  10/19/20 1306 -- -- -- -- -- 93 %  10/19/20 1304 117/76 -- -- -- (!) 30 --   No intake/output data recorded. No intake/output data recorded.    Awake, alert.  BiPAP on face.  Saturating 93% currently.  Somewhat restless, but per patient's mother, he normally is hard to redirect and he is very impulsive. Anterior neck incision dressing in place.  His neck is soft and trachea is midline. Moving all extremities x4 with full strength.  Data  Review CT neck was reviewed.  There are postoperative changes related to his 3 level ACDF.  There is mild prevertebral swelling.  His pharyngeal swelling appears normal and expected.  There is no neck hematoma.  His CT chest showed bilateral opacities consistent with aspiration pneumonia.  Assessment:   Active Problems:   Hypoxia   Acute hypoxemic respiratory failure [facility])  This is a 55 year old man with recent 3 level ACDF who has respiratory distress related to aspiration pneumonia.    Plan:  -Given his significant hypoxia, he is being admitted to the ICU.  If his respiratory status declines, he will need to be intubated.  He is a poor candidate for steroids given his pneumonia, severe diabetes, and history of severe behavioral changes with steroids per the mother.  I would recommend keeping the patient n.p.o. with IV fluids at least until his respiratory status improves.  I discussed this with the patient's mother as well as the ICU attending Dr. Isaiah Serge.  All questions and concerns were answered.

## 2020-10-19 NOTE — ED Notes (Addendum)
Pt reports that he has been unable to take home meds due to pain from surgery and inability swallow from same. When asked which meds pt is most concerned that he has not been able to take, pt reports he is concerned about his xanax that he has been on for years and states "it can kill you if you don't take it". He is also concerned that he hasn't been able to take his pain medication. He states "I know I can get these IV if I can't swallow".

## 2020-10-19 NOTE — H&P (Signed)
NAME:  Edward Garner, MRN:  [id number], DOB:  1966/01/02, LOS: 0 ADMISSION DATE:  10/19/2020, CONSULTATION DATE:  10/19/20 REFERRING MD:  TRH, CHIEF COMPLAINT:  Hypoxic resp failure   History of Present Illness:  55 year old male with prior history of former smoker, OSA, HTN, HLD, DMT2, seizures, and cervical spondylosis/ stenosis s/p ACDF on 10/3 presenting from home with confusion.   Patient recently underwent cervical ACDF C3-C6 with Dr. [name] on 10/3 and discharged home on 10/4.  Patient presented to ER later that day after syncopal episode at home with negative CTH and cervical CT and required scalp laceration repair.  No other complaints, normal ddimer and hemodynamics and therefore discharged home.  He possibly had a virtual visit with his PCP yesterday with some concern for pneumonia and was started on an unknown antibiotic, only taking one dose.  Returns to ER again today for progressive lethargy at home found by EMS with O2 saturations in the 70's, placed on NRB and no improvement to narcan.  Additionally reported decrease oral intake due to ongoing pain and some difficulty swallowing, only taking 2.5 tablets of his hydrocodone today.  In ER, he was afebrile, normotensive, HR 100, rr 24 and on NRB.  Workup notable for BNP 99, normal WBC but mild left shift on differential, Hgb 11.4, neg trop hs x 2, and VBG/ mixed showing 7.3/ 43/ 64/ 22.  UA neg, SARs/ flu neg.  CT cervical showed slightly increased prevertebral swelling compared to 10/4 and narrowed AP diameter of the larynx the level of the epiglottis to 5 mm, CTH negative for acute intracranial process, and CTA PE was negative for PE, but showed multifocal pneumonia and thickening of esophageal wall possibly reflecting esophagitis, and hepatomegaly.   He remains lethargic but given ongoing hypoxia and increasing tachypnea, PCCM consulted for concern of respiratory failure.   Pertinent  Medical History  Former smoker, OSA, HTN, DMT2,  seizures, cervical spondylosis/ stenosis  Significant Hospital Events: Including procedures, antibiotic start and stop dates in addition to other pertinent events   10/3 cervical ACDF C3-C6 with Dr. [name], discharged home 10/4 10/4 syncope at home, ER eval neg, discharged home 10/6 Admit to PCCM for acute hypoxic respiratory failure 2/2 multifocal pna, on BiPAP  Interim History / Subjective:  As above.   Objective   Blood pressure (!) 145/83, pulse (!) 109, temperature 98.2 F (36.8 C), temperature source Oral, resp. rate (!) 31, SpO2 94 %.       No intake or output data in the 24 hours ending 10/19/20 1815 There were no vitals filed for this visit.  Examination: General: adult male, mild respiratory distress  HENT: Surgical dressing noted to right lateral neck  Lungs: coarse, diminished to bases Cardiovascular: Tachy, no mRG Abdomen: soft, non-tender, active bowel sounds  Extremities: -edema Neuro: alert, follows simple commands, pupils intact and reactive  GU: intact   Resolved Hospital Problem list    Assessment & Plan:   Hypoxic respiratory failure in the setting of multifocal pneumonia, concern for aspiration event with prevertebral swelling s/p C-Spine Fusion  H/O OSA on CPAP at HS  Plan -Trial BiPAP now with strict NPO > Monitor for intubation needs  -Titrate supplemental oxygen for saturation goal >92 -Follow Culture Data, Strep P and Urine Legion pending  -Continue Cefepime/Vancomycin, Send MRSA PCR -Will discuss benefits of steroids at this point with active infection and swelling   Dysphagia -CT Neck with slightly increase prevertebral swelling compared to 10/4 Plan -  ST evaluation once respiratory status stable   Bipolar  Plan  -on home Xanax, Prozac, Lyrica, Ambien, Wellbutrin > hold for now due to concern for aspiration  Seizure Disorder  Plan -Continue Depakote (on ER 1500 mg Daily, will start 500 mg q8h), send Depakote level   HTN Plan -PRN  Hydralazine/Labetalol for Systolic >180 -Hold Cozaar, Minipress until clear by speech   HLD Plan -Hold home Crestor   DM Plan -Trend Glucose -SSI  Recent C-Spine Fusion by Dr. [name] 10/4 Plan -Neurosurgery will follow in AM    Best Practice (right click and "Reselect all SmartList Selections" daily)   Diet/type: NPO DVT prophylaxis: LMWH GI prophylaxis: H2B Lines: N/A Foley:  N/A Code Status:  full code Last date of multidisciplinary goals of care discussion [pending]  Labs   CBC: Recent Labs  Lab 10/16/20 1107 10/17/20 1638 10/19/20 1421 10/19/20 1438  WBC  --  11.0* 6.6  --   NEUTROABS  --  8.5* 4.1  --   HGB 15.0 13.3 11.4* 11.2*  HCT 44.0 40.6 34.7* 33.0*  MCV  --  99.5 99.7  --   PLT  --  199 183  --     Basic Metabolic Panel: Recent Labs  Lab 10/16/20 1107 10/17/20 1638 10/19/20 1421 10/19/20 1438  NA 140 141 143 145  K 4.2 4.1 4.1 4.1  CL 102 105 111  --   CO2  --  27 21*  --   GLUCOSE 151* 166* 108*  --   BUN 19 12 14   --   CREATININE 0.80 0.80 1.11  --   CALCIUM  --  9.3 9.2  --    GFR: Estimated Creatinine Clearance: 83.9 mL/min (by C-G formula based on SCr of 1.11 mg/dL). Recent Labs  Lab 10/17/20 1638 10/19/20 1421  WBC 11.0* 6.6    Liver Function Tests: Recent Labs  Lab 10/19/20 1421  AST 19  ALT 14  ALKPHOS 46  BILITOT 1.0  PROT 5.8*  ALBUMIN 2.7*   No results for input(s): LIPASE, AMYLASE in the last 168 hours. No results for input(s): AMMONIA in the last 168 hours.  ABG    Component Value Date/Time   HCO3 22.1 10/19/2020 1438   TCO2 23 10/19/2020 1438   ACIDBASEDEF 4.0 (H) 10/19/2020 1438   O2SAT 90.0 10/19/2020 1438     Coagulation Profile: Recent Labs  Lab 10/16/20 1112  INR 1.0    Cardiac Enzymes: No results for input(s): CKTOTAL, CKMB, CKMBINDEX, TROPONINI in the last 168 hours.  HbA1C: Hemoglobin A1C  Date/Time Value Ref Range Status  05/31/2019 02:11 PM 8.9 (A) 4.0 - 5.6 % Final  03/01/2019  04:52 PM 7.6 (A) 4.0 - 5.6 % Final  11/05/2018 12:00 AM 8.8  Final   Hgb A1c MFr Bld  Date/Time Value Ref Range Status  10/16/2020 07:52 PM 8.0 (H) 4.8 - 5.6 % Final    Comment:    (NOTE) Pre diabetes:          5.7%-6.4%  Diabetes:              >6.4%  Glycemic control for   <7.0% adults with diabetes     CBG: Recent Labs  Lab 10/16/20 1038 10/16/20 1721 10/16/20 2102 10/17/20 0624  GLUCAP 146* 137* 186* 137*    Review of Systems:   +SOB, +Cough, no sputum production   Past Medical History:  He,  has a past medical history of 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HCC), Bipolar disorder (HCC), Complication  of anesthesia, Diabetes mellitus without complication (HCC), Hyperlipidemia, Hypertension, Kidney disease, PONV (postoperative nausea and vomiting), Sleep apnea, Sleep apnea, unspecified (10/19/2018), and Spinal stenosis.   Surgical History:   Past Surgical History:  Procedure Laterality Date   ANTERIOR CERVICAL DECOMP/DISCECTOMY FUSION N/A 10/16/2020   Procedure: ACDF - C3-C4 - C4-C5 - C5-C6;  Surgeon: Tia Alert, MD;  Location: [facility] OR;  Service: Neurosurgery;  Laterality: N/A;   TONSILLECTOMY       Social History:   reports that he quit smoking about 26 years ago. His smoking use included cigarettes. He has a 0.25 pack-year smoking history. He has quit using smokeless tobacco.  His smokeless tobacco use included snuff. He reports that he does not currently use alcohol. He reports that he does not use drugs.   Family History:  His family history includes Bipolar disorder in his father; Cancer in his father; Diabetes in his father; Hyperlipidemia in his father; Hypertension in his father; Non-Hodgkin's lymphoma in his mother; Thyroid disease in his father.   Allergies Allergies  Allergen Reactions   Gramineae Pollens    Other Other (See Comments)    Steroids causes agitation and elevated sugar levels   Tramadol     Not very effective     Home Medications  Prior to Admission  medications   Medication Sig Start Date End Date Taking? Authorizing Provider  ALPRAZolam (XANAX XR) 1 MG 24 hr tablet Take 3 mg by mouth in the morning. 02/03/19   [provider]  amphetamine-dextroamphetamine (ADDERALL) 20 MG tablet Take 20 mg by mouth daily in the afternoon.    [provider]  buPROPion (WELLBUTRIN XL) 300 MG 24 hr tablet Take 300 mg by mouth every morning. 01/10/19   [provider]  cycloSPORINE (RESTASIS) 0.05 % ophthalmic emulsion Place 1 drop into both eyes 2 (two) times daily.    [provider]  divalproex (DEPAKOTE ER) 500 MG 24 hr tablet Take 1,500 mg by mouth at bedtime.    [provider]  FLUoxetine (PROZAC) 40 MG capsule Take 80 mg by mouth in the morning.    [provider]  glipiZIDE (GLUCOTROL XL) 5 MG 24 hr tablet Take 1 tablet (5 mg total) by mouth daily with breakfast. Patient taking differently: Take 10 mg by mouth daily with breakfast. 05/31/19   Nida, Denman George, MD  JARDIANCE 10 MG TABS tablet Take 10 mg by mouth daily. 08/09/20   [provider]  lisdexamfetamine (VYVANSE) 50 MG capsule Take 50 mg by mouth in the morning.    [provider]  losartan (COZAAR) 50 MG tablet Take 50 mg by mouth daily.    [provider]  metFORMIN (GLUCOPHAGE) 500 MG tablet Take 2,000 mg by mouth daily with breakfast.    [provider]  methocarbamol (ROBAXIN) 500 MG tablet Take 1 tablet (500 mg total) by mouth every 6 (six) hours as needed for muscle spasms. 10/17/20   Meyran, Tiana Loft, NP  oxyCODONE-acetaminophen (PERCOCET) 10-325 MG tablet Take 1 tablet by mouth every 6 (six) hours as needed for pain. 10/17/20 10/17/21  Meyran, Tiana Loft, NP  prazosin (MINIPRESS) 1 MG capsule Take 1 mg by mouth at bedtime as needed (nightmares). 10/02/20   [provider]  pregabalin (LYRICA) 150 MG capsule Take 100 mg by mouth in the morning.    [provider]   rosuvastatin (CRESTOR) 20 MG tablet Take 20 mg by mouth daily.    [provider]  tamsulosin (FLOMAX) 0.4 MG CAPS capsule Take 0.4 mg by mouth in the morning. 09/20/20   [provider]  TRULICITY 1.5 MG/0.5ML SOPN ADMINISTER 1.5 MG UNDER THE SKIN 1 TIME A WEEK 07/14/19   Nida, Denman George, MD  TRULICITY 3 MG/0.5ML SOPN Inject 3 mg into the skin once a week. 08/08/20   [provider]  zolpidem (AMBIEN) 10 MG tablet TAKE 1 TABLET(10 MG) BY MOUTH AT BEDTIME Patient taking differently: Take 10 mg by mouth at bedtime. 08/25/20   Tomma Lightning, MD     Critical care time: 42 minutes     CRITICAL CARE Performed by: Tobey Grim   Total critical care time: 42 minutes  Critical care time was exclusive of separately billable procedures and treating other patients.  Critical care was necessary to treat or prevent imminent or life-threatening deterioration.  Critical care was time spent personally by me on the following activities: development of treatment plan with patient and/or surrogate as well as nursing, discussions with consultants, evaluation of patient's response to treatment, examination of patient, obtaining history from patient or surrogate, ordering and performing treatments and interventions, ordering and review of laboratory studies, ordering and review of radiographic studies, pulse oximetry and re-evaluation of patient's condition.  Jovita Kussmaul, AGACNP-BC  Pulmonary & Critical Care  PCCM Pgr: [phone]

## 2020-10-19 NOTE — Progress Notes (Addendum)
Patient lethargic, breathing at 33-35 times per minute, O2 sats barely maintained at lower 90s on 10 L high-flow O2.   Worrying about patient deteriorate and deepening fatigue and weight deteriorate and need intubation.  Explained to patient wife at bedside who expressed understanding and agreed.  Discussed with ICU team, who agreed patient will be admitted to ICU for close monitoring tonight.  D/W neurosurgery Dr. Maisie Fus, who reviewed CV cervical spine and soft tissue, agreed with n.p.o. IV fluid and speech evaluation.

## 2020-10-19 NOTE — ED Provider Notes (Signed)
[facility] EMERGENCY DEPARTMENT Provider Note   CSN: [id number] Arrival date & time: 10/19/20  1255     History Chief Complaint  Patient presents with   Respiratory Distress   Altered Mental Status    Edward Garner is a 55 y.o. male.  55 year old male with a past medical history significant for seizures, sleep apnea, hypertension, type 2 diabetes, status post cervical spinal fusion on 10/4 presenting for respiratory distress and altered mental status.  He had cervical spine surgery on 10/3 and presented to [facility] emergency department on 10/4 for syncope where he fell and struck his head leading to a laceration.  He had a CT head at that time which showed no acute traumatic pathology.  He also had a D-dimer and a troponin which were within normal limits during that hospital stay.  Patient's mother provided some additional history stating that he talk to his primary care doctor through a virtual visit yesterday who had concerns for pneumonia and started him on an antibiotic.  She is unsure what antibiotic it was but states that he had about 1 dose of that.  She states that today he was not able to swallow his meds well and was having trouble swallowing water and she noted some slurring of his speech as well as him seeming to have trouble catching his breath.  She spoke to her next-door neighbor who is a Designer, jewellery who came over and listened to him with a stethoscope and told her that his lung sounds were reduced on the right side, she also used a pulse ox which reportedly showed a number in the 70% range.  They called EMS who came and started him on nonrebreather.  They also gave him some Narcan which did not improve his symptoms.  The patient is currently alert and oriented x4 but seems somewhat confused when asking general questions.  He does have some slurring in his voice which the mom states worsened after his fall and subsequent ED visit.  The patient states  that he normally takes 3x10 hydrocodone per day before his surgery but yesterday and today was not able to take much of his pain medications.  He states that he took 2x 1/2 tablets today because he couldn't swallow the whole tablets. He denies any fevers but states that his cough has worsened.      Past Medical History:  Diagnosis Date   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HCC)    Bipolar disorder (HCC)    Complication of anesthesia    Diabetes mellitus without complication (HCC)    Hyperlipidemia    Hypertension    Kidney disease    PONV (postoperative nausea and vomiting)    Sleep apnea    Sleep apnea, unspecified 10/19/2018   Spinal stenosis     Patient Active Problem List   Diagnosis Date Noted   S/P cervical spinal fusion 10/16/2020   Uncontrolled type 2 diabetes mellitus with hyperglycemia (HCC) 03/01/2019   Essential hypertension, benign 03/01/2019   Mixed hyperlipidemia 03/01/2019   Sleep apnea, unspecified 10/19/2018   Seizure disorder (HCC) 09/28/2018    Past Surgical History:  Procedure Laterality Date   ANTERIOR CERVICAL DECOMP/DISCECTOMY FUSION N/A 10/16/2020   Procedure: ACDF - C3-C4 - C4-C5 - C5-C6;  Surgeon: Tia Alert, MD;  Location: [facility] OR;  Service: Neurosurgery;  Laterality: N/A;   TONSILLECTOMY         Family History  Problem Relation Age of Onset   Non-Hodgkin's lymphoma Mother  Diabetes Father    Cancer Father    Thyroid disease Father    Hypertension Father    Hyperlipidemia Father    Bipolar disorder Father     Social History   Tobacco Use   Smoking status: Former    Packs/day: 0.50    Years: 0.50    Pack years: 0.25    Types: Cigarettes    Quit date: 1996    Years since quitting: 26.7   Smokeless tobacco: Former    Types: Snuff  Vaping Use   Vaping Use: Never used  Substance Use Topics   Alcohol use: Not Currently   Drug use: Never    Home Medications Prior to Admission medications   Medication Sig Start Date End Date Taking?  Authorizing Provider  ALPRAZolam (XANAX XR) 1 MG 24 hr tablet Take 3 mg by mouth in the morning. 02/03/19   [provider]  amphetamine-dextroamphetamine (ADDERALL) 20 MG tablet Take 20 mg by mouth daily in the afternoon.    [provider]  buPROPion (WELLBUTRIN XL) 300 MG 24 hr tablet Take 300 mg by mouth every morning. 01/10/19   [provider]  cycloSPORINE (RESTASIS) 0.05 % ophthalmic emulsion Place 1 drop into both eyes 2 (two) times daily.    [provider]  divalproex (DEPAKOTE ER) 500 MG 24 hr tablet Take 1,500 mg by mouth at bedtime.    [provider]  FLUoxetine (PROZAC) 40 MG capsule Take 80 mg by mouth in the morning.    [provider]  glipiZIDE (GLUCOTROL XL) 5 MG 24 hr tablet Take 1 tablet (5 mg total) by mouth daily with breakfast. Patient taking differently: Take 10 mg by mouth daily with breakfast. 05/31/19   Nida, Denman George, MD  JARDIANCE 10 MG TABS tablet Take 10 mg by mouth daily. 08/09/20   [provider]  lisdexamfetamine (VYVANSE) 50 MG capsule Take 50 mg by mouth in the morning.    [provider]  losartan (COZAAR) 50 MG tablet Take 50 mg by mouth daily.    [provider]  metFORMIN (GLUCOPHAGE) 500 MG tablet Take 2,000 mg by mouth daily with breakfast.    [provider]  methocarbamol (ROBAXIN) 500 MG tablet Take 1 tablet (500 mg total) by mouth every 6 (six) hours as needed for muscle spasms. 10/17/20   Meyran, Tiana Loft, NP  oxyCODONE-acetaminophen (PERCOCET) 10-325 MG tablet Take 1 tablet by mouth every 6 (six) hours as needed for pain. 10/17/20 10/17/21  Meyran, Tiana Loft, NP  prazosin (MINIPRESS) 1 MG capsule Take 1 mg by mouth at bedtime as needed (nightmares). 10/02/20   [provider]  pregabalin (LYRICA) 150 MG capsule Take 100 mg by mouth in the morning.    [provider]  rosuvastatin (CRESTOR) 20 MG tablet Take 20 mg by mouth daily.     [provider]  tamsulosin (FLOMAX) 0.4 MG CAPS capsule Take 0.4 mg by mouth in the morning. 09/20/20   [provider]  TRULICITY 1.5 MG/0.5ML SOPN ADMINISTER 1.5 MG UNDER THE SKIN 1 TIME A WEEK Patient not taking: No sig reported 07/14/19   Roma Kayser, MD  TRULICITY 3 MG/0.5ML SOPN Inject 3 mg into the skin once a week. 08/08/20   [provider]  zolpidem (AMBIEN) 10 MG tablet TAKE 1 TABLET(10 MG) BY MOUTH AT BEDTIME Patient taking differently: Take 10 mg by mouth at bedtime. 08/25/20   Tomma Lightning, MD    Allergies  Gramineae pollens, Other, and Tramadol  Review of Systems   Review of Systems  Constitutional:  Negative for chills and fever.  HENT:  Positive for congestion.   Respiratory:  Positive for cough and shortness of breath.   Cardiovascular:  Negative for chest pain and leg swelling.  Gastrointestinal:  Negative for abdominal pain, blood in stool, nausea and vomiting.  Genitourinary:  Negative for dysuria.  Musculoskeletal:        Cervical neck pain s/p fusion surgery  Skin:  Negative for rash.  Neurological:  Positive for speech difficulty.  Psychiatric/Behavioral:  Positive for confusion.    Physical Exam Updated Vital Signs BP 117/71   Pulse 95   Temp 98.2 F (36.8 C) (Oral)   Resp (!) 28   SpO2 96%   Physical Exam HENT:     Mouth/Throat:     Mouth: Mucous membranes are dry.  Eyes:     Extraocular Movements: Extraocular movements intact.     Pupils: Pupils are equal, round, and reactive to light.  Neck:     Comments: Cervical collar in place Cardiovascular:     Rate and Rhythm: Normal rate and regular rhythm.     Heart sounds: No murmur heard. Pulmonary:     Effort: Tachypnea present.     Breath sounds: Examination of the right-lower field reveals decreased breath sounds. Decreased breath sounds and rhonchi present.  Abdominal:     General: Abdomen is flat. Bowel sounds are normal.     Palpations: Abdomen  is soft.     Tenderness: There is no abdominal tenderness.  Skin:    General: Skin is warm and dry.  Neurological:     General: No focal deficit present.     Mental Status: He is alert and oriented to person, place, and time.     Cranial Nerves: No cranial nerve deficit.     Motor: No weakness.     Comments: Slurring of speech   Psychiatric:        Mood and Affect: Mood normal.    ED Results / Procedures / Treatments   Labs (all labs ordered are listed, but only abnormal results are displayed) Labs Reviewed  COMPREHENSIVE METABOLIC PANEL - Abnormal; Notable for the following components:      Result Value   CO2 21 (*)    Glucose, Bld 108 (*)    Total Protein 5.8 (*)    Albumin 2.7 (*)    All other components within normal limits  CBC WITH DIFFERENTIAL/PLATELET - Abnormal; Notable for the following components:   RBC 3.48 (*)    Hemoglobin 11.4 (*)    HCT 34.7 (*)    Abs Immature Granulocytes 0.20 (*)    All other components within normal limits  I-STAT VENOUS BLOOD GAS, ED - Abnormal; Notable for the following components:   pCO2, Ven 43.9 (*)    pO2, Ven 64.0 (*)    Acid-base deficit 4.0 (*)    HCT 33.0 (*)    Hemoglobin 11.2 (*)    All other components within normal limits  RESP PANEL BY RT-PCR (FLU A&B, COVID) ARPGX2  URINE CULTURE  CULTURE, BLOOD (SINGLE)  BRAIN NATRIURETIC PEPTIDE  URINALYSIS, ROUTINE W REFLEX MICROSCOPIC  TROPONIN I (HIGH SENSITIVITY)  TROPONIN I (HIGH SENSITIVITY)    EKG None  Radiology DG Ribs Unilateral W/Chest Left  Result Date: 10/17/2020 CLINICAL DATA:  Left-sided rib pain after fall. EXAM: LEFT RIBS AND CHEST - 3+ VIEW COMPARISON:  None. FINDINGS: No fracture  or other bone lesions are seen involving the ribs. There is no evidence of pneumothorax or pleural effusion. Both lungs are clear. Heart size and mediastinal contours are within normal limits. Multiple calcified intra-articular bodies in the left shoulder and elbow. IMPRESSION: 1.  No acute rib fracture. 2. No acute cardiopulmonary disease. 3. 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 Electronically Signed   By: Obie Dredge M.D.   On: 10/17/2020 18:05   CT HEAD WO CONTRAST ( )  Result Date: 10/17/2020 CLINICAL DATA:  Head trauma.  Recent cervical fusion yesterday. EXAM: CT HEAD WITHOUT CONTRAST CT CERVICAL SPINE WITHOUT CONTRAST TECHNIQUE: Multidetector CT imaging of the head and cervical spine was performed following the standard protocol without intravenous contrast. Multiplanar CT image reconstructions of the cervical spine were also generated. COMPARISON:  Cervical spine x-ray 10/16/2020. FINDINGS: CT HEAD FINDINGS Brain: No evidence of acute infarction, hemorrhage, hydrocephalus, extra-axial collection or mass lesion/mass effect. Vascular: No hyperdense vessel or unexpected calcification. Skull: Normal. Negative for fracture or focal lesion. Sinuses/Orbits: No acute finding. Other: Posterior scalp laceration.  No foreign body. CT CERVICAL SPINE FINDINGS Alignment: Normal. Skull base and vertebrae: No acute fracture. No primary bone lesion or focal pathologic process. Anterior plate and vertebral body screws are seen at C2, C3, C4 and C5. Disc spaces are seen at the surgical levels. Soft tissues and spinal canal: There is some prevertebral soft tissue edema and air as well as air and edema in the right neck and upper mediastinum compatible with recent surgery. No obvious focal fluid collection identified. Central spinal canal soft tissues within normal limits. Disc levels: There is facet arthropathy at multiple levels. Disc space osteophytes are noted at multiple levels. There is disc space narrowing it C6-C7. There is severe neural foraminal stenosis on the left at C4-C5 secondary to facet arthropathy and uncovertebral spurring. There is severe neural foraminal stenosis on the right at C3-C4 secondary  to uncovertebral spurring. There is no significant central canal stenosis. Upper chest: Negative. Other: None. IMPRESSION: 1.  No acute intracranial process. 2. No acute fracture or traumatic subluxation of the cervical spine. 3. Anterior fusion C2-C5 with prevertebral swelling and air as well as edema and air in the right neck compatible with recent surgery. No fluid collection identified. Electronically Signed   By: Darliss Cheney M.D.   On: 10/17/2020 17:07   CT Cervical Spine Wo Contrast  Result Date: 10/17/2020 CLINICAL DATA:  Head trauma.  Recent cervical fusion yesterday. EXAM: CT HEAD WITHOUT CONTRAST CT CERVICAL SPINE WITHOUT CONTRAST TECHNIQUE: Multidetector CT imaging of the head and cervical spine was performed following the standard protocol without intravenous contrast. Multiplanar CT image reconstructions of the cervical spine were also generated. COMPARISON:  Cervical spine x-ray 10/16/2020. FINDINGS: CT HEAD FINDINGS Brain: No evidence of acute infarction, hemorrhage, hydrocephalus, extra-axial collection or mass lesion/mass effect. Vascular: No hyperdense vessel or unexpected calcification. Skull: Normal. Negative for fracture or focal lesion. Sinuses/Orbits: No acute finding. Other: Posterior scalp laceration.  No foreign body. CT CERVICAL SPINE FINDINGS Alignment: Normal. Skull base and vertebrae: No acute fracture. No primary bone lesion or focal pathologic process. Anterior plate and vertebral body screws are seen at C2, C3, C4 and C5. Disc spaces are seen at the surgical levels. Soft tissues and spinal canal: There is some prevertebral soft tissue edema and air as well as air and edema in the right neck and upper mediastinum compatible with recent surgery. No obvious focal  fluid collection identified. Central spinal canal soft tissues within normal limits. Disc levels: There is facet arthropathy at multiple levels. Disc space osteophytes are noted at multiple levels. There is disc space  narrowing it C6-C7. There is severe neural foraminal stenosis on the left at C4-C5 secondary to facet arthropathy and uncovertebral spurring. There is severe neural foraminal stenosis on the right at C3-C4 secondary to uncovertebral spurring. There is no significant central canal stenosis. Upper chest: Negative. Other: None. IMPRESSION: 1.  No acute intracranial process. 2. No acute fracture or traumatic subluxation of the cervical spine. 3. Anterior fusion C2-C5 with prevertebral swelling and air as well as edema and air in the right neck compatible with recent surgery. No fluid collection identified. Electronically Signed   By: Darliss Cheney M.D.   On: 10/17/2020 17:07   DG Chest Portable 1 View  Result Date: 10/19/2020 CLINICAL DATA:  Shortness of breath. EXAM: PORTABLE CHEST 1 VIEW COMPARISON:  10/17/2020 FINDINGS: Normal cardiomediastinal contours. Interval development of multifocal bilateral airspace opacities involving the right midlung right base and retrocardiac left lung base. No signs of pleural effusion or edema. Multiple loose bodies are again noted within the left glenohumeral joint. IMPRESSION: Interval development of multifocal bilateral airspace opacities compatible with multifocal infection. Electronically Signed   By: Signa Kell M.D.   On: 10/19/2020 14:30   DG Shoulder Left  Result Date: 10/17/2020 CLINICAL DATA:  Left shoulder pain after fall. EXAM: LEFT SHOULDER - 2+ VIEW COMPARISON:  Left shoulder x-rays dated November 04, 2019. FINDINGS: No acute fracture or dislocation. Progressive severe glenohumeral joint space narrowing, now with bone-on-bone apposition. Multiple calcified intra-articular bodies again noted. 4.5 cm lucent lesion in the subscapularis recess corresponds to a lipoma seen on prior MRI. Soft tissues are unremarkable. IMPRESSION: 1. No acute osseous abnormality. 2. 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  Electronically Signed   By: Obie Dredge M.D.   On: 10/17/2020 18:07    Procedures Procedures   Medications Ordered in ED Medications  vancomycin (VANCOREADY) IVPB 1500 mg/300 mL (has no administration in time range)  ceFEPIme (MAXIPIME) 2 g in sodium chloride 0.9 % 100 mL IVPB (2 g Intravenous New Bag/Given 10/19/20 1519)  lactated ringers bolus 500 mL (500 mLs Intravenous New Bag/Given 10/19/20 1414)  fentaNYL (SUBLIMAZE) injection 100 mcg (100 mcg Intravenous Given 10/19/20 1434)    ED Course  I have reviewed the triage vital signs and the nursing notes.  Pertinent labs & imaging results that were available during my care of the patient were reviewed by me and considered in my medical decision making (see chart for details).    MDM Rules/Calculators/A&P                          55 year old male presenting with concern for confusion and respiratory distress.  He recently had a cervical spine fusion on 10/3 and visited the [facility] long emergency department on 10/4 after having a bout of syncope, falling, and causing a head laceration.  His CT scan at that time showed no acute traumatic pathology.  His mother however states that since leaving the emergency department he has had slurring of speech which has worsened.  He has also noticed shortness of breath.  His mother states that he reached out to his PCP yesterday who started him on an oral antibiotic due to concern for pneumonia.  He has not yet had a chest x-ray for this.  He had a desat earlier today when the next-door neighbor who is a regular nurse came over and used pulse ox showing levels in the 70s on room air.  This prompted the emergency department visit along with his confusion which has been going on throughout the day.  Currently on nonrebreather with O2 sats appropriate.  He is on room air at baseline.  Overall differential is broad as the patient has 2 main areas of concern.  With his slurring of speech, concern for confusion  and difficulty swallowing after head trauma 2 days ago this certainly raises concern for subdural hematoma or other intracranial abnormality as the cause.  He did have a negative CT scan during ED visit at that time but this could have missed an early subdural.  As his surgery was anterior approach there is also concern the difficulty swallowing could be related to post-surgical complication. His second problem of concern is his breathing status and concern for pneumonia per PCP, there is also the possibility of a PE as he recently had surgery.  Well's score of 1.5 with negative d-dimer at the ED 2 days ago. Lung sounds with crackles bilaterally, poor air movement in the right lower lobe.  He appears dry on physical exam and does not have any pitting edema of the lower extremities.  We will check a CBC, CMP, BNP, urinalysis, urine culture, troponin.  We will give 500 cc saline bolus.  We will get a chest x-ray.  We will give of fentanyl for pain.  Chest x-ray returns with concern for multifocal infection with multifocal bilateral airspace opacities.  We will initiate cefepime and vancomycin as the patient had surgery and was intubated three days ago increasing the risk for pseudomonal infection.  We will draw blood cultures. CBC returns with no elevation of white blood cell count.  VBG appropriate.  CMP returns with creatinine within normal limits.  Will order CT head for altered mental status, slurring of speech.  We will order CT soft tissue neck with contrast due to recent anterior approach cervical spinal surgery and difficulty swallowing.  Will order CTA to rule out PE with no hypoxia in the setting post surgery. Patient signed out to Dr. Jacqulyn Bath.  Final Clinical Impression(s) / ED Diagnoses Final diagnoses:  Acute respiratory failure with hypoxia [facility])    Rx / DC Orders ED Discharge Orders     None        Jackelyn Poling, DO 10/19/20 1524    Maia Plan, MD 10/23/20 [phone]

## 2020-10-19 NOTE — ED Triage Notes (Signed)
Pt arrives from home via EMS. Pt had syncopal episode 2 days ago was seen at [facility] for same. Pt was discharged. Pt has become more lethargic since discharge per family. EMS reports O2 sats in 70's on RA. Pt placed on NRB, sats rose to low 90's. Pt is lethargic but can slow to answer. Pt on pain meds at home, given 1 mg narcan with minimal change. Pt is warm to touch. EMS reports minimal lung sounds to right and decreased in left. Family reports decrease in oral intake due to surgical pain. 18g IV established in L hand. 500 ml NS administered. BP 112/56 HR 100 RR 24 O2 93% NRB 15L CBG 150

## 2020-10-19 NOTE — Progress Notes (Addendum)
Pharmacy Antibiotic Note  Edward Garner is a 55 y.o. male admitted on 10/19/2020 with pneumonia following spinal surgery on 10/3. Pharmacy has been consulted for vancomycin and cefepime dosing.  Patient is afebrile with WBC 6.6 and SCr 1.11.   Plan: Start vancomycin 1500 mg IV once LD Follow with vancomycin 1000 mg IV Q 12 hrs.       Goal AUC 400-550. Expected AUC: 483.5      SCr used: 1.1 Start cefepime 2 g IV Q 8 hours Currently planning on 7 DOT Monitor renal function, cultures, and clinical improvement    Temp (24hrs), Avg:98.2 F (36.8 C), Min:98.2 F (36.8 C), Max:98.2 F (36.8 C)  Recent Labs  Lab 10/16/20 1107 10/17/20 1638 10/19/20 1421  WBC  --  11.0* 6.6  CREATININE 0.80 0.80  --     Estimated Creatinine Clearance: 116.5 mL/min (by C-G formula based on SCr of 0.8 mg/dL).    Allergies  Allergen Reactions   Gramineae Pollens    Other Other (See Comments)    Steroids causes agitation and elevated sugar levels   Tramadol     Not very effective    Antimicrobials this admission: Vancomycin 10/6>> Cefepime 10/6>>  Microbiology results: 10/6 BCx: in process 10/6 UCx: in process  10/3 MRSA PCR: negative  Thank you for allowing pharmacy to be a part of this patient's care.  Georgeann Oppenheim 10/19/2020 2:57 PM

## 2020-10-19 NOTE — Progress Notes (Signed)
eLink Physician-Brief Progress Note Patient Name: Edward Garner DOB: 1965-12-31 MRN: [id number]   Date of Service  10/19/2020  HPI/Events of Note  Hypoglycemia - Blood glucose = 88.   eICU Interventions  Plan: D10W IV infusion at 20 mL/hour.      Intervention Category Major Interventions: Other:  Lenell Antu 10/19/2020, 11:50 PM

## 2020-10-19 NOTE — Progress Notes (Signed)
CCM at bedside. Order for bipap and to hold off on ABG for now. Patient tolerating bipap well at this time. RN made aware.

## 2020-10-19 NOTE — ED Notes (Signed)
Pt removed bipap, was able to redirect pt and place bipap back on pt.

## 2020-10-19 NOTE — ED Notes (Signed)
Patient transported to CT 

## 2020-10-19 NOTE — ED Provider Notes (Signed)
  Physical Exam  BP 122/80   Pulse (!) 109   Temp 98.2 F (36.8 C) (Oral)   Resp 16   SpO2 94%   Physical Exam Vitals and nursing note reviewed.  Constitutional:      Appearance: He is well-developed.  HENT:     Head: Normocephalic and atraumatic.  Eyes:     Conjunctiva/sclera: Conjunctivae normal.  Cardiovascular:     Rate and Rhythm: Regular rhythm. Tachycardia present.     Heart sounds: No murmur heard. Pulmonary:     Effort: Pulmonary effort is normal. No respiratory distress.     Breath sounds: Rales present.  Abdominal:     Palpations: Abdomen is soft.     Tenderness: There is no abdominal tenderness.  Musculoskeletal:     Cervical back: Neck supple.  Skin:    General: Skin is warm and dry.  Neurological:     Mental Status: He is alert.    ED Course/Procedures     Procedures  MDM  Patient received in handoff.  History of cervical fusion with anterior access now with vomiting and hypoxia.  CT imaging reveals evidence of multifocal pneumonia but no evidence of iatrogenic injury.  Patient will be admitted to medicine.       Glendora Score, MD 10/19/20 2125

## 2020-10-20 ENCOUNTER — Encounter (HOSPITAL_COMMUNITY): Payer: Self-pay | Admitting: Neurological Surgery

## 2020-10-20 ENCOUNTER — Inpatient Hospital Stay (HOSPITAL_COMMUNITY): Payer: Medicare Other

## 2020-10-20 DIAGNOSIS — J9601 Acute respiratory failure with hypoxia: Secondary | ICD-10-CM | POA: Diagnosis not present

## 2020-10-20 LAB — CBC WITH DIFFERENTIAL/PLATELET
Abs Immature Granulocytes: 0.3 10*3/uL — ABNORMAL HIGH (ref 0.00–0.07)
Basophils Absolute: 0 10*3/uL (ref 0.0–0.1)
Basophils Relative: 0 %
Eosinophils Absolute: 0 10*3/uL (ref 0.0–0.5)
Eosinophils Relative: 0 %
HCT: 33.9 % — ABNORMAL LOW (ref 39.0–52.0)
Hemoglobin: 11.3 g/dL — ABNORMAL LOW (ref 13.0–17.0)
Lymphocytes Relative: 19 %
Lymphs Abs: 1.6 10*3/uL (ref 0.7–4.0)
MCH: 32.8 pg (ref 26.0–34.0)
MCHC: 33.3 g/dL (ref 30.0–36.0)
MCV: 98.3 fL (ref 80.0–100.0)
Metamyelocytes Relative: 3 %
Monocytes Absolute: 1.6 10*3/uL — ABNORMAL HIGH (ref 0.1–1.0)
Monocytes Relative: 19 %
Myelocytes: 1 %
Neutro Abs: 4.9 10*3/uL (ref 1.7–7.7)
Neutrophils Relative %: 58 %
Platelets: 208 10*3/uL (ref 150–400)
RBC: 3.45 MIL/uL — ABNORMAL LOW (ref 4.22–5.81)
RDW: 14.3 % (ref 11.5–15.5)
WBC: 8.4 10*3/uL (ref 4.0–10.5)
nRBC: 0 % (ref 0.0–0.2)
nRBC: 0 /100 WBC

## 2020-10-20 LAB — MAGNESIUM
Magnesium: 1.7 mg/dL (ref 1.7–2.4)
Magnesium: 1.9 mg/dL (ref 1.7–2.4)
Magnesium: 2.1 mg/dL (ref 1.7–2.4)

## 2020-10-20 LAB — BASIC METABOLIC PANEL
Anion gap: 13 (ref 5–15)
BUN: 18 mg/dL (ref 6–20)
CO2: 19 mmol/L — ABNORMAL LOW (ref 22–32)
Calcium: 9.5 mg/dL (ref 8.9–10.3)
Chloride: 112 mmol/L — ABNORMAL HIGH (ref 98–111)
Creatinine, Ser: 1.1 mg/dL (ref 0.61–1.24)
GFR, Estimated: >60 mL/min (ref >60)
Glucose, Bld: 104 mg/dL — ABNORMAL HIGH (ref 70–99)
Potassium: 3.9 mmol/L (ref 3.5–5.1)
Sodium: 144 mmol/L (ref 135–145)

## 2020-10-20 LAB — GLUCOSE, CAPILLARY
Glucose-Capillary: 109 mg/dL — ABNORMAL HIGH (ref 70–99)
Glucose-Capillary: 113 mg/dL — ABNORMAL HIGH (ref 70–99)
Glucose-Capillary: 123 mg/dL — ABNORMAL HIGH (ref 70–99)
Glucose-Capillary: 141 mg/dL — ABNORMAL HIGH (ref 70–99)
Glucose-Capillary: 166 mg/dL — ABNORMAL HIGH (ref 70–99)
Glucose-Capillary: 174 mg/dL — ABNORMAL HIGH (ref 70–99)
Glucose-Capillary: 91 mg/dL (ref 70–99)

## 2020-10-20 LAB — PHOSPHORUS
Phosphorus: 3.7 mg/dL (ref 2.5–4.6)
Phosphorus: 3.7 mg/dL (ref 2.5–4.6)
Phosphorus: 3.6 mg/dL (ref 2.5–4.6)

## 2020-10-20 LAB — URINE CULTURE: Culture: NO GROWTH

## 2020-10-20 MED ORDER — MAGNESIUM SULFATE 2 GM/50ML IV SOLN
2.0000 g | Freq: Once | INTRAVENOUS | Status: AC
  Administered 2020-10-20: 2 g via INTRAVENOUS
  Filled 2020-10-20: qty 50

## 2020-10-20 MED ORDER — DOCUSATE SODIUM 50 MG/5ML PO LIQD
100.0000 mg | Freq: Two times a day (BID) | ORAL | Status: DC | PRN
  Filled 2020-10-20: qty 10

## 2020-10-20 MED ORDER — FAMOTIDINE 20 MG PO TABS
20.0000 mg | ORAL_TABLET | Freq: Two times a day (BID) | ORAL | Status: DC
  Administered 2020-10-20 – 2020-11-10 (×40): 20 mg
  Filled 2020-10-20 (×40): qty 1

## 2020-10-20 MED ORDER — LORAZEPAM 2 MG/ML IJ SOLN
1.0000 mg | Freq: Once | INTRAMUSCULAR | Status: AC
  Administered 2020-10-20: 1 mg via INTRAVENOUS
  Filled 2020-10-20: qty 1

## 2020-10-20 MED ORDER — CHLORHEXIDINE GLUCONATE CLOTH 2 % EX PADS
6.0000 | MEDICATED_PAD | Freq: Every day | CUTANEOUS | Status: DC
  Administered 2020-10-19 – 2020-10-26 (×10): 6 via TOPICAL

## 2020-10-20 MED ORDER — LEVETIRACETAM IN NACL 1000 MG/100ML IV SOLN
1000.0000 mg | Freq: Once | INTRAVENOUS | Status: AC
  Administered 2020-10-20: 1000 mg via INTRAVENOUS
  Filled 2020-10-20: qty 100

## 2020-10-20 MED ORDER — SODIUM CHLORIDE 0.9 % IV SOLN
3.0000 g | Freq: Four times a day (QID) | INTRAVENOUS | Status: AC
  Administered 2020-10-20 – 2020-10-26 (×24): 3 g via INTRAVENOUS
  Filled 2020-10-20 (×24): qty 8

## 2020-10-20 MED ORDER — SENNOSIDES-DOCUSATE SODIUM 8.6-50 MG PO TABS
1.0000 | ORAL_TABLET | Freq: Every day | ORAL | Status: DC
  Administered 2020-10-20 – 2020-10-21 (×2): 1
  Filled 2020-10-20 (×2): qty 1

## 2020-10-20 MED ORDER — JEVITY 1.5 CAL/FIBER PO LIQD
1000.0000 mL | ORAL | Status: DC
  Administered 2020-10-20 – 2020-11-01 (×11): 1000 mL
  Filled 2020-10-20 (×21): qty 1000

## 2020-10-20 MED ORDER — DEXAMETHASONE SODIUM PHOSPHATE 4 MG/ML IJ SOLN
4.0000 mg | Freq: Four times a day (QID) | INTRAMUSCULAR | Status: AC
  Administered 2020-10-20 – 2020-10-21 (×4): 4 mg via INTRAVENOUS
  Filled 2020-10-20 (×4): qty 1

## 2020-10-20 MED ORDER — ALPRAZOLAM 0.5 MG PO TABS
1.0000 mg | ORAL_TABLET | Freq: Three times a day (TID) | ORAL | Status: DC
  Administered 2020-10-20 – 2020-10-23 (×10): 1 mg
  Filled 2020-10-20 (×10): qty 2

## 2020-10-20 MED ORDER — PROSOURCE TF PO LIQD
45.0000 mL | Freq: Two times a day (BID) | ORAL | Status: DC
  Administered 2020-10-20 – 2020-11-02 (×27): 45 mL
  Filled 2020-10-20 (×27): qty 45

## 2020-10-20 MED ORDER — SENNOSIDES-DOCUSATE SODIUM 8.6-50 MG PO TABS: 1.0000 | ORAL_TABLET | Freq: Every day | ORAL | Status: DC

## 2020-10-20 MED ORDER — VALPROIC ACID 250 MG/5ML PO SOLN
500.0000 mg | Freq: Three times a day (TID) | ORAL | Status: DC
  Administered 2020-10-20 – 2020-11-10 (×61): 500 mg
  Filled 2020-10-20 (×61): qty 10

## 2020-10-20 MED ORDER — IPRATROPIUM-ALBUTEROL 0.5-2.5 (3) MG/3ML IN SOLN
3.0000 mL | Freq: Two times a day (BID) | RESPIRATORY_TRACT | Status: DC
  Administered 2020-10-20 – 2020-10-22 (×3): 3 mL via RESPIRATORY_TRACT
  Filled 2020-10-20 (×3): qty 3

## 2020-10-20 MED ORDER — POLYETHYLENE GLYCOL 3350 17 G PO PACK
17.0000 g | PACK | Freq: Every day | ORAL | Status: DC | PRN
  Administered 2020-10-25: 17 g

## 2020-10-20 MED ORDER — ADULT MULTIVITAMIN W/MINERALS CH
1.0000 | ORAL_TABLET | Freq: Every day | ORAL | Status: DC
  Administered 2020-10-20 – 2020-11-10 (×21): 1
  Filled 2020-10-20 (×22): qty 1

## 2020-10-20 NOTE — Progress Notes (Signed)
eLink Physician-Brief Progress Note Patient Name: Edward Garner DOB: 01-Dec-1965 MRN: [id number]   Date of Service  10/20/2020  HPI/Events of Note  Patient with PMH of seizure disorder on Valproic Acid. NPO at present pending swallow evaluation tomorrow. Hospital has no Valproate IV.   eICU Interventions  Plan: Keppra 1 gm IV now. Rounding team to decide if patient can get PO Valproic Acid if patient passes swallow study or needs NGT/OGT if patient fails swallow study.      Intervention Category Major Interventions: Seizures - evaluation and management  Joseandres Mazer Eugene 10/20/2020, 3:47 AM

## 2020-10-20 NOTE — Progress Notes (Signed)
NAME:  Edward Garner, MRN:  [id number], DOB:  November 22, 1965, LOS: 1 ADMISSION DATE:  10/19/2020, CONSULTATION DATE:  10/19/20 REFERRING MD:  TRH, CHIEF COMPLAINT:  Hypoxic resp failure   History of Present Illness:  55 year old male with prior history of former smoker, OSA, HTN, HLD, DMT2, seizures, and cervical spondylosis/ stenosis s/p ACDF on 10/3 presenting from home with confusion.   Patient recently underwent cervical ACDF C3-C6 with Dr. [name] on 10/3 and discharged home on 10/4.  Patient presented to ER later that day after syncopal episode at home with negative CTH and cervical CT and required scalp laceration repair.  No other complaints, normal ddimer and hemodynamics and therefore discharged home.  He possibly had a virtual visit with his PCP yesterday with some concern for pneumonia and was started on an unknown antibiotic, only taking one dose.  Returns to ER again today for progressive lethargy at home found by EMS with O2 saturations in the 70's, placed on NRB and no improvement to narcan.  Additionally reported decrease oral intake due to ongoing pain and some difficulty swallowing, only taking 2.5 tablets of his hydrocodone today.  In ER, he was afebrile, normotensive, HR 100, rr 24 and on NRB.  Workup notable for BNP 99, normal WBC but mild left shift on differential, Hgb 11.4, neg trop hs x 2, and VBG/ mixed showing 7.3/ 43/ 64/ 22.  UA neg, SARs/ flu neg.  CT cervical showed slightly increased prevertebral swelling compared to 10/4 and narrowed AP diameter of the larynx the level of the epiglottis to 5 mm, CTH negative for acute intracranial process, and CTA PE was negative for PE, but showed multifocal pneumonia and thickening of esophageal wall possibly reflecting esophagitis, and hepatomegaly.   He remains lethargic but given ongoing hypoxia and increasing tachypnea, PCCM consulted for concern of respiratory failure.   Pertinent  Medical History  Former smoker, OSA, HTN, DMT2,  seizures, cervical spondylosis/ stenosis  Significant Hospital Events: Including procedures, antibiotic start and stop dates in addition to other pertinent events   10/3 cervical ACDF C3-C6 with Dr. [name], discharged home 10/4 10/4 syncope at home, ER eval neg, discharged home 10/6 Admit to PCCM for acute hypoxic respiratory failure 2/2 multifocal pna, on BiPAP 10/7 On 11L [place], pending swallow eval  Interim History / Subjective:  Drowsy but able to tell me his name and birthdate  Objective   Blood pressure (!) 150/92, pulse (!) 102, temperature 98.9 F (37.2 C), temperature source Axillary, resp. rate 18, height 6' (1.829 m), weight 73.8 kg, SpO2 97 %.    FiO2 (%):  [50 %-60 %] 60 %   Intake/Output Summary (Last 24 hours) at 10/20/2020 0722 Last data filed at 10/20/2020 0600 Gross per 24 hour  Intake 1451.98 ml  Output 1275 ml  Net 176.98 ml   Filed Weights   10/19/20 2040 10/20/20 0500  Weight: 73.3 kg 73.8 kg   Physical Exam: General: Chronically-appearing, no acute distress, drowsy HENT: [place], AT, OP clear, MMM Eyes: EOMI, no scleral icterus Respiratory: Diminished breath sound bilaterally.  No crackles, wheezing or rales Cardiovascular: RRR, -M/R/G, no JVD GI: BS+, soft, nontender Extremities:-Edema,-tenderness Neuro: AAO x2, CNII-XII grossly intact  Resolved Hospital Problem list    Assessment & Plan:   Acute hypoxemic respiratory failure in the setting of multifocal pneumonia, concern for aspiration event with prevertebral swelling s/p C-Spine Fusion  H/O OSA on CPAP at HS  Plan -NPO, swallow eval pending -Continue supplemental oxygen for  goal SpO2 >90% -De-escalate broad spectrum antibiotics pending culture data -F/u cx, Strep P and Urine Legion pending  -DC Vanc -Continue Cefepime -Speech evaluation  Dysphagia -CT Neck with slightly increase prevertebral swelling compared to 10/4 Plan -Speech evaluation -If fails, he will need enteral access for his  anti-seizure meds  Seizure Disorder  -Subtherapeutic Depakote -IV Depakote on shortage Plan -S/p Keppra 5 am -When able, start Depakote (on ER 1500 mg Daily, will start 500 mg q8h) -Will discuss with pharmacy/Neuro for alternative regimen  Bipolar  Plan  -on home Xanax, Prozac, Lyrica, Ambien, Wellbutrin > hold for now due to concern for aspiration  Hypomag Plan -Repleted  HTN Plan -PRN Hydralazine/Labetalol for Systolic >180 -Hold Cozaar, Minipress until clear by speech   HLD Plan -Hold home Crestor   DM Plan -D10 gtt -Trend Glucose -SSI  Recent C-Spine Fusion by Dr. [name] 10/4 Plan -Neurosurgery will follow in AM    Best Practice (right click and "Reselect all SmartList Selections" daily)   Diet/type: NPO DVT prophylaxis: LMWH GI prophylaxis: H2B Lines: N/A Foley:  N/A Code Status:  full code Last date of multidisciplinary goals of care discussion [pending]  Labs   Electrolytes stable CO2 19. Kidney function appropriate Mag 1.7 Stable anemia  Critical care time: 35 minutes    The patient is critically ill with acute respiratory failure and high risk for deterioration. Care requires complexity decision making for assessment and support, frequent evaluation and titration of therapies, application of advanced monitoring technologies and extensive interpretation of multiple databases.    Mechele Collin, M.D. [facility]monary/Critical Care Medicine 10/20/2020 7:22 AM   Please see Amion for pager number to reach on-call Pulmonary and Critical Care Team.

## 2020-10-20 NOTE — Progress Notes (Signed)
Mg 1.7 Replaced per protocol  

## 2020-10-20 NOTE — Progress Notes (Addendum)
Initial Nutrition Assessment  DOCUMENTATION CODES:   Non-severe (moderate) malnutrition in context of chronic illness  INTERVENTION:   Multivitamin w/ minerals daily  Start enteral feeds via Cortrak Jevity 1.5 @ 65 mL/hr (1560 mL/day) 45 mL ProSource TF - BID 200 mL water flushes q4h Provides 2420 kcal, 122 gm PRO, 1186 mL of free water (2386 mL total water w/ flushes)  Start at a rate of 25 mL/hr and advance by 10 mL q8h Pt is at risk for refeeding syndrome given poor PO intake x5 days, recommend rechecking electrolytes for at least 5 occurences and supplementing as needed.   Recommend bowel regimen for the pt due to no bowel movement since Monday.  Given poorly controlled DM, pt will likely require further insulin coverage after TF is initiated   NUTRITION DIAGNOSIS:   Moderate Malnutrition related to chronic illness as evidenced by moderate fat depletion, moderate muscle depletion.  GOAL:   Patient will meet greater than or equal to 90% of their needs  MONITOR:   TF tolerance, Weight trends, Skin  REASON FOR ASSESSMENT:   Consult Enteral/tube feeding initiation and management  ASSESSMENT:   55 y.o. male presented to the ED with lethargy and O2 saturations in the 70's. Pt received a cervical ACDF C3-C6 on 10/3 and d/c on 10/4, pt returned to the ED after passing out and was d/c that day. Pt then had a televisit with PCP for pneumonia and was placed on an antibiotic. PMH of T2DM and HTN. Pt admitted with hypoxia respiratory failure due to pneumonia.   10/7: Cortrak placed; SLP recommend NPO  Pt mother at bedside and able to provide nutrition related information.  Per mother, pt lives alone and is able to do his own grocery shopping. Pt can cook but typically does not and will typically graze versus eating a set meal. Reports that pt typically has a protein shake for breakfast, did not state typical intake for lunch or dinner. Pt mother, reported that pt has almost 0%  PO since surgery on Monday, stated he had spaghetti on Monday after surgery and nothing since.  Per pt mother, pt's UBW is ~170# and that he has had some weight loss, reporting that pt weighed 163# 2 weeks ago. Per EMR, pt has had 6% weight loss in 7 months, this is not clinically significant for the time period. Per Care Everywhere, pt's weight was fluctuating between 170-176# for 8 months.   Per pt mother, pt lives a very sedentary lifestyle due to having difficulty walking. Stated that he does do about an hour of physical activity per day on either a stationary bike or rowing machine.   Per mother, pt has not had a bowel movement since Monday, messaged MD about starting bowel regimen due to starting TF today.  Cortrak placement today due to swelling in throat post-cervical surgery on 10/3. Will provide TF recommendations above. SLP following, but unable to advance diet at this time.   Medications reviewed and include: Decadron, SSI 0-15 units q4h, Ativan, IV antibiotics, Pepcid Labs reviewed: Hgb A1c 8%, 24 hr BG trends 88-113 mg/dL  NUTRITION - FOCUSED PHYSICAL EXAM:  Flowsheet Row Most Recent Value  Orbital Region Moderate depletion  Upper Arm Region Moderate depletion  Thoracic and Lumbar Region Moderate depletion  Buccal Region Moderate depletion  Temple Region Moderate depletion  Clavicle Bone Region Moderate depletion  Clavicle and Acromion Bone Region Moderate depletion  Scapular Bone Region Moderate depletion  Dorsal Hand Moderate depletion  Patellar Region  Severe depletion  Anterior Thigh Region Severe depletion  Posterior Calf Region Severe depletion  Edema (RD Assessment) None  Hair Reviewed  Eyes Reviewed  Mouth Reviewed  Skin Reviewed  Nails Reviewed        Diet Order:   Diet Order             Diet NPO time specified  Diet effective now                   EDUCATION NEEDS:   No education needs have been identified at this time  Skin:  Skin  Assessment: Skin Integrity Issues: Skin Integrity Issues:: Incisions Incisions: Neck  Last BM:  No Documentation  Height:   Ht Readings from Last 1 Encounters:  10/19/20 6' (1.829 m)    Weight:   Wt Readings from Last 1 Encounters:  10/20/20 73.8 kg    Ideal Body Weight:  80.9 kg  BMI:  Body mass index is 22.07 kg/m.  Estimated Nutritional Needs:   Kcal:  2250-2450  Protein:  115-130 grams  Fluid:  >/= 2.3 L    Kiamesha Samet BS, PLDN Clinical Dietitian See [facility] for contact information.

## 2020-10-20 NOTE — Progress Notes (Signed)
   10/20/20 0357  Therapy Vitals  Pulse Rate (!) 106  Resp (!) 24  Patient Position (if appropriate) Lying  MEWS Score/Color  MEWS Score 2  MEWS Score Color Yellow  Oxygen Therapy/Pulse Ox  O2 Device HFNC  O2 Therapy Oxygen  O2 Flow Rate (L/min) 13 L/min  SpO2 99 %  Removed pt. Off bipap placed on HFNC pt. Tolerating current above settings.

## 2020-10-20 NOTE — Progress Notes (Signed)
I reviewed CT scan of the neck, he has the expected amount of swelling, the construct itself looks fine.  I discontinued the Lovenox as I think it is still too early to start Lovenox this soon after 4 level anterior cervical surgery, and I ordered sequential compression devices instead.  May start Lovenox on Monday if he is still here.  I ordered Decadron for the dysphagia and soft tissue swelling in the throat.

## 2020-10-20 NOTE — Evaluation (Signed)
Clinical/Bedside Swallow Evaluation Patient Details  Name: Edward Garner MRN: [id number] Date of Birth: 1965-02-18  Today's Date: 10/20/2020 Time: SLP Start Time (ACUTE ONLY): 0850 SLP Stop Time (ACUTE ONLY): 0910 SLP Time Calculation (min) (ACUTE ONLY): 20 min  Past Medical History:  Past Medical History:  Diagnosis Date   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HCC)    Bipolar disorder (HCC)    Complication of anesthesia    Diabetes mellitus without complication (HCC)    Hyperlipidemia    Hypertension    Kidney disease    PONV (postoperative nausea and vomiting)    Sleep apnea    Sleep apnea, unspecified 10/19/2018   Spinal stenosis    Past Surgical History:  Past Surgical History:  Procedure Laterality Date   ANTERIOR CERVICAL DECOMP/DISCECTOMY FUSION N/A 10/16/2020   Procedure: ACDF - C3-C4 - C4-C5 - C5-C6;  Surgeon: Edward Alert, MD;  Location: [facility] OR;  Service: Neurosurgery;  Laterality: N/A;   TONSILLECTOMY     HPI:  Patient recently underwent cervical ACDF C3-C6 with Dr. Yetta Garner on 10/3 and discharged home on 10/4.  Patient presented to ER later that day after syncopal episode at home with negative CTH and cervical CT and required scalp laceration repair.  No other complaints, normal ddimer and hemodynamics and therefore discharged home.  He possibly had a virtual visit with his PCP yesterday with some concern for pneumonia and was started on an unknown antibiotic, only taking one dose.  Returns to ER again today for progressive lethargy at home found by EMS with O2 saturations in the 70's, placed on NRB and no improvement to narcan.  Additionally reported decrease oral intake due to ongoing pain and some difficulty swallowing, CT cervical showed slightly increased prevertebral swelling compared to 10/4 and narrowed AP diameter of the larynx the level of the epiglottis to 5 mm, CTH negative for acute intracranial process, and CTA PE was negative for PE, but showed multifocal pneumonia and  thickening of esophageal wall possibly reflecting esophagitis, and hepatomegaly.    Assessment / Plan / Recommendation  Clinical Impression  Pt demonstrates immediate coughing and wet vocal quality during multiple swallows of ice and small sip of water. A swallow response is appreciated, but suspect reduced compliance of pharyngoesophageal segment given immediate wet coughing and swelling of posterior pharyngeal wall and esophagus on imaging. Expect rapid recovery after steriods given, recommended short term tube feeding for access to necessary oral meds in the meantime. Will f/u for readiness. SLP Visit Diagnosis: Dysphagia, oropharyngeal phase (R13.12)    Aspiration Risk  Moderate aspiration risk    Diet Recommendation Ice chips PRN after oral care   Medication Administration: Via alternative means    Other  Recommendations      Recommendations for follow up therapy are one component of a multi-disciplinary discharge planning process, led by the attending physician.  Recommendations may be updated based on patient status, additional functional criteria and insurance authorization.  Follow up Recommendations None      Frequency and Duration            Prognosis        Swallow Study   General HPI: Patient recently underwent cervical ACDF C3-C6 with Dr. Yetta Garner on 10/3 and discharged home on 10/4.  Patient presented to ER later that day after syncopal episode at home with negative CTH and cervical CT and required scalp laceration repair.  No other complaints, normal ddimer and hemodynamics and therefore discharged home.  He possibly had a virtual  visit with his PCP yesterday with some concern for pneumonia and was started on an unknown antibiotic, only taking one dose.  Returns to ER again today for progressive lethargy at home found by EMS with O2 saturations in the 70's, placed on NRB and no improvement to narcan.  Additionally reported decrease oral intake due to ongoing pain and some  difficulty swallowing, CT cervical showed slightly increased prevertebral swelling compared to 10/4 and narrowed AP diameter of the larynx the level of the epiglottis to 5 mm, CTH negative for acute intracranial process, and CTA PE was negative for PE, but showed multifocal pneumonia and thickening of esophageal wall possibly reflecting esophagitis, and hepatomegaly. Type of Study: Bedside Swallow Evaluation Previous Swallow Assessment: none Diet Prior to this Study: NPO Temperature Spikes Noted: No Respiratory Status: Room air History of Recent Intubation: No Behavior/Cognition: Garner;Cooperative;Distractible Oral Cavity Assessment: Dry;Dried secretions Oral Care Completed by SLP: Yes Oral Cavity - Dentition:  (upper implant) Self-Feeding Abilities: Needs assist Patient Positioning: Upright in bed Baseline Vocal Quality: Wet Volitional Cough: Congested;Weak Volitional Swallow: Able to elicit    Oral/Motor/Sensory Function Overall Oral Motor/Sensory Function: Within functional limits   Ice Chips Ice chips: Impaired Presentation: Spoon Pharyngeal Phase Impairments: Cough - Immediate;Wet Vocal Quality;Multiple swallows   Thin Liquid Presentation: Cup;Self Fed Pharyngeal  Phase Impairments: Wet Vocal Quality;Multiple swallows;Cough - Immediate    Nectar Thick Nectar Thick Liquid: Not tested   Honey Thick Honey Thick Liquid: Not tested   Puree Puree: Not tested   Solid     Solid: Not tested      Edward Garner, Edward Garner 10/20/2020,12:42 PM

## 2020-10-20 NOTE — Procedures (Signed)
Cortrak ? ?Tube Type:  Cortrak - 43 inches ?Tube Location:  Left nare ?Initial Placement:  Stomach ?Secured by: Bridle ?Technique Used to Measure Tube Placement:  Marking at nare/corner of mouth ?Cortrak Secured At:  75 cm ? ?Cortrak Tube Team Note: ? ?Consult received to place a Cortrak feeding tube.  ? ?X-ray is required, abdominal x-ray has been ordered by the Cortrak team. Please confirm tube placement before using the Cortrak tube.  ? ?If the tube becomes dislodged please keep the tube and contact the Cortrak team at [email or web address] (password [email or web address]) for replacement.  ?If after hours and replacement cannot be delayed, place a NG tube and confirm placement with an abdominal x-ray.  ? ? ?Claire Bridge MS, RD, LDN ?Please refer to AMION for RD and/or RD on-call/weekend/after hours pager ? ? ?

## 2020-10-21 DIAGNOSIS — E440 Moderate protein-calorie malnutrition: Secondary | ICD-10-CM | POA: Insufficient documentation

## 2020-10-21 DIAGNOSIS — J9601 Acute respiratory failure with hypoxia: Secondary | ICD-10-CM | POA: Diagnosis not present

## 2020-10-21 LAB — PHOSPHORUS
Phosphorus: 3.2 mg/dL (ref 2.5–4.6)
Phosphorus: 2.5 mg/dL (ref 2.5–4.6)

## 2020-10-21 LAB — MAGNESIUM
Magnesium: 2.2 mg/dL (ref 1.7–2.4)
Magnesium: 2.2 mg/dL (ref 1.7–2.4)

## 2020-10-21 LAB — BASIC METABOLIC PANEL
Anion gap: 11 (ref 5–15)
BUN: 27 mg/dL — ABNORMAL HIGH (ref 6–20)
CO2: 23 mmol/L (ref 22–32)
Calcium: 10 mg/dL (ref 8.9–10.3)
Chloride: 114 mmol/L — ABNORMAL HIGH (ref 98–111)
Creatinine, Ser: 0.9 mg/dL (ref 0.61–1.24)
GFR, Estimated: >60 mL/min (ref >60)
Glucose, Bld: 158 mg/dL — ABNORMAL HIGH (ref 70–99)
Potassium: 3.7 mmol/L (ref 3.5–5.1)
Sodium: 148 mmol/L — ABNORMAL HIGH (ref 135–145)

## 2020-10-21 LAB — GLUCOSE, CAPILLARY
Glucose-Capillary: 118 mg/dL — ABNORMAL HIGH (ref 70–99)
Glucose-Capillary: 216 mg/dL — ABNORMAL HIGH (ref 70–99)
Glucose-Capillary: 195 mg/dL — ABNORMAL HIGH (ref 70–99)
Glucose-Capillary: 218 mg/dL — ABNORMAL HIGH (ref 70–99)
Glucose-Capillary: 203 mg/dL — ABNORMAL HIGH (ref 70–99)
Glucose-Capillary: 230 mg/dL — ABNORMAL HIGH (ref 70–99)

## 2020-10-21 LAB — CBC
HCT: 36.8 % — ABNORMAL LOW (ref 39.0–52.0)
Hemoglobin: 12.5 g/dL — ABNORMAL LOW (ref 13.0–17.0)
MCH: 32.6 pg (ref 26.0–34.0)
MCHC: 34 g/dL (ref 30.0–36.0)
MCV: 96.1 fL (ref 80.0–100.0)
Platelets: 292 10*3/uL (ref 150–400)
RBC: 3.83 MIL/uL — ABNORMAL LOW (ref 4.22–5.81)
RDW: 14.1 % (ref 11.5–15.5)
WBC: 11.4 10*3/uL — ABNORMAL HIGH (ref 4.0–10.5)
nRBC: 0 % (ref 0.0–0.2)

## 2020-10-21 MED ORDER — BUPROPION HCL 100 MG PO TABS
100.0000 mg | ORAL_TABLET | Freq: Three times a day (TID) | ORAL | Status: DC
  Administered 2020-10-21 – 2020-10-31 (×30): 100 mg
  Filled 2020-10-21 (×30): qty 1

## 2020-10-21 MED ORDER — ZOLPIDEM TARTRATE 5 MG PO TABS
5.0000 mg | ORAL_TABLET | Freq: Once | ORAL | Status: AC
  Administered 2020-10-22: 5 mg
  Filled 2020-10-21: qty 1

## 2020-10-21 MED ORDER — FLUOXETINE HCL 20 MG/5ML PO SOLN
40.0000 mg | Freq: Every day | ORAL | Status: DC
  Administered 2020-10-21: 40 mg via ORAL
  Filled 2020-10-21: qty 10

## 2020-10-21 MED ORDER — FLUOXETINE HCL 20 MG/5ML PO SOLN
40.0000 mg | Freq: Every day | ORAL | Status: DC
  Administered 2020-10-22 – 2020-11-10 (×20): 40 mg
  Filled 2020-10-21 (×20): qty 10

## 2020-10-21 MED ORDER — LOSARTAN POTASSIUM 50 MG PO TABS
50.0000 mg | ORAL_TABLET | Freq: Every day | ORAL | Status: DC
  Administered 2020-10-22 – 2020-10-27 (×6): 50 mg
  Filled 2020-10-21 (×7): qty 1

## 2020-10-21 MED ORDER — BUPROPION HCL 100 MG PO TABS
100.0000 mg | ORAL_TABLET | Freq: Three times a day (TID) | ORAL | Status: DC
  Administered 2020-10-21: 100 mg via ORAL
  Filled 2020-10-21 (×3): qty 1

## 2020-10-21 MED ORDER — ROSUVASTATIN CALCIUM 20 MG PO TABS
20.0000 mg | ORAL_TABLET | Freq: Every day | ORAL | Status: DC
  Administered 2020-10-21 – 2020-11-09 (×19): 20 mg
  Filled 2020-10-21 (×19): qty 1

## 2020-10-21 MED ORDER — LOSARTAN POTASSIUM 50 MG PO TABS
50.0000 mg | ORAL_TABLET | Freq: Every day | ORAL | Status: DC
  Administered 2020-10-21: 50 mg via ORAL
  Filled 2020-10-21: qty 1

## 2020-10-21 MED ORDER — METOPROLOL TARTRATE 12.5 MG HALF TABLET
12.5000 mg | ORAL_TABLET | Freq: Four times a day (QID) | ORAL | Status: DC
  Administered 2020-10-21 – 2020-10-22 (×4): 12.5 mg via ORAL
  Filled 2020-10-21 (×4): qty 1

## 2020-10-21 MED ORDER — ZOLPIDEM TARTRATE 5 MG PO TABS: 5.0000 mg | ORAL_TABLET | Freq: Once | ORAL | Status: DC

## 2020-10-21 MED ORDER — ROSUVASTATIN CALCIUM 20 MG PO TABS: 20.0000 mg | ORAL_TABLET | Freq: Every day | ORAL | Status: DC

## 2020-10-21 NOTE — Progress Notes (Addendum)
eLink Physician-Brief Progress Note Patient Name: Edward Garner DOB: February 10, 1965 MRN: [id number]   Date of Service  10/21/2020  HPI/Events of Note  Agitation - Patient gets very confused at night. Likely sundowning. Trying to get OOB. Patient trying to get OOB and nursing concerned he may get OOB and fall. Patient getting Xanax for anxiety and Dilaudid for pain. Patient takes Ambien at home for sleep.  eICU Interventions  Plan: Posey belt and side rails X 4 for 9 hours. Ambien 5 mg per tube now.      Intervention Category Major Interventions: Delirium, psychosis, severe agitation - evaluation and management  Allona Gondek Eugene 10/21/2020, 11:55 PM

## 2020-10-21 NOTE — Progress Notes (Signed)
He looks good this morning.  His sats are 96%.  He denies significant pain.  Feeding tube is in place.  I will check his swallowing every day as it often gets better quite quickly after ACDF.

## 2020-10-21 NOTE — Progress Notes (Signed)
Speech Language Pathology Treatment: Dysphagia  Patient Details Name: Edward Garner MRN: [id number] DOB: 1965/08/06 Today's Date: 10/21/2020 Time: 6629-4765 SLP Time Calculation (min) (ACUTE ONLY): 21 min  Assessment / Plan / Recommendation Clinical Impression  Followed up for PO trials following diligent oral care. Pt continues to exhibit isolated overt coughing with ice chips and thin liquids. Wet vocal quality also appreciated with isolated PO trials concerning for compromised airway protection. Pt being treated for pre vertebral swelling post ACDF with steroid regimen. Recommend continue NPO with single ice chips following diligent oral care with staff. SLP will continue to closely monitor. Anticipate with more time, swallow function will improve.    HPI HPI: Patient recently underwent cervical ACDF C3-C6 with Dr. [name] on 10/3 and discharged home on 10/4.  Patient presented to ER later that day after syncopal episode at home with negative CTH and cervical CT and required scalp laceration repair.  No other complaints, normal ddimer and hemodynamics and therefore discharged home.  He possibly had a virtual visit with his PCP yesterday with some concern for pneumonia and was started on an unknown antibiotic, only taking one dose.  Returns to ER again today for progressive lethargy at home found by EMS with O2 saturations in the 70's, placed on NRB and no improvement to narcan.  Additionally reported decrease oral intake due to ongoing pain and some difficulty swallowing, CT cervical showed slightly increased prevertebral swelling compared to 10/4 and narrowed AP diameter of the larynx the level of the epiglottis to 5 mm, CTH negative for acute intracranial process, and CTA PE was negative for PE, but showed multifocal pneumonia and thickening of esophageal wall possibly reflecting esophagitis, and hepatomegaly.      SLP Plan  Continue with current plan of care      Recommendations for  follow up therapy are one component of a multi-disciplinary discharge planning process, led by the attending physician.  Recommendations may be updated based on patient status, additional functional criteria and insurance authorization.    Recommendations  Diet recommendations: NPO Medication Administration: Via alternative means                Oral Care Recommendations: Oral care QID;Oral care prior to ice chip/H20 SLP Visit Diagnosis: Dysphagia, oropharyngeal phase (R13.12) Plan: Continue with current plan of care       GO                Ardyth Gal MA, CCC-SLP Acute Rehabilitation Services    10/21/2020, 9:21 AM

## 2020-10-21 NOTE — Progress Notes (Signed)
NAME:  Edward Garner, MRN:  [id number], DOB:  [birth year], LOS: 2 ADMISSION DATE:  10/19/2020, CONSULTATION DATE:  10/19/20 REFERRING MD:  TRH, CHIEF COMPLAINT:  Hypoxic resp failure   History of Present Illness:  55 year old male with prior history of former smoker, OSA, HTN, HLD, DMT2, seizures, and cervical spondylosis/ stenosis s/p ACDF on 10/3 presenting from home with confusion.   Patient recently underwent cervical ACDF C3-C6 with Dr. [name] on 10/3 and discharged home on 10/4.  Patient presented to ER later that day after syncopal episode at home with negative CTH and cervical CT and required scalp laceration repair.  No other complaints, normal ddimer and hemodynamics and therefore discharged home.  He possibly had a virtual visit with his PCP yesterday with some concern for pneumonia and was started on an unknown antibiotic, only taking one dose.  Returns to ER again today for progressive lethargy at home found by EMS with O2 saturations in the 70's, placed on NRB and no improvement to narcan.  Additionally reported decrease oral intake due to ongoing pain and some difficulty swallowing, only taking 2.5 tablets of his hydrocodone today.  In ER, he was afebrile, normotensive, HR 100, rr 24 and on NRB.  Workup notable for BNP 99, normal WBC but mild left shift on differential, Hgb 11.4, neg trop hs x 2, and VBG/ mixed showing 7.3/ 43/ 64/ 22.  UA neg, SARs/ flu neg.  CT cervical showed slightly increased prevertebral swelling compared to 10/4 and narrowed AP diameter of the larynx the level of the epiglottis to 5 mm, CTH negative for acute intracranial process, and CTA PE was negative for PE, but showed multifocal pneumonia and thickening of esophageal wall possibly reflecting esophagitis, and hepatomegaly.   He remains lethargic but given ongoing hypoxia and increasing tachypnea, PCCM consulted for concern of respiratory failure.   Pertinent  Medical History  Former smoker, OSA, HTN, DMT2,  seizures, cervical spondylosis/ stenosis  Significant Hospital Events: Including procedures, antibiotic start and stop dates in addition to other pertinent events   10/3 cervical ACDF C3-C6 with Dr. [name], discharged home 10/4 10/4 syncope at home, ER eval neg, discharged home 10/6 Admit to PCCM for acute hypoxic respiratory failure 2/2 multifocal pna, on BiPAP 10/7 On 11L [place]  Interim History / Subjective:  Tolerated BiPAP overnight. Improved oxygenation. Failed swallow  Objective   Blood pressure (!) 161/104, pulse (!) 102, temperature 97.7 F (36.5 C), temperature source Oral, resp. rate 14, height 6' (1.829 m), weight 72.8 kg, SpO2 96 %.    FiO2 (%):  [60 %] 60 %   Intake/Output Summary (Last 24 hours) at 10/21/2020 0956 Last data filed at 10/21/2020 0500 Gross per 24 hour  Intake 569 ml  Output 1270 ml  Net -701 ml   Filed Weights   10/19/20 2040 10/20/20 0500 10/21/20 0500  Weight: 73.3 kg 73.8 kg 72.8 kg   Physical Exam: General: Chronically ill-appearing, no acute distress HENT: [place], AT, OP clear, MMM Eyes: EOMI, no scleral icterus Respiratory: Diminished breath sounds bilaterally.  No crackles, wheezing or rales Cardiovascular: RRR, -M/R/G, no JVD GI: BS+, soft, nontender Extremities:-Edema,-tenderness Neuro: Intermittently drowsy but when awake AAO x2, CNII-XII grossly intact   Resolved Hospital Problem list    Assessment & Plan:   Acute hypoxemic respiratory failure in the setting of multifocal pneumonia, concern for aspiration event with prevertebral swelling s/p C-Spine Fusion  H/O OSA on CPAP at HS  Plan -Remain NPO, speech following -Wean  oxygen for goal SpO2 >90% -De-escalate to Unasyn. Complete 7 days -F/u cx, Strep P and Urine Legion pending   Dysphagia -CT Neck with slightly increase prevertebral swelling compared to 10/4 Plan -Speech following -Cortrak in place 10/7  Seizure Disorder  -Subtherapeutic Depakote -IV Depakote on  shortage Plan -Continue Depakote  Bipolar  Plan  -Hold Lyrica, Ambien -Resume home xanax, prozac (reduced), and wellbutrin  Hypomag Plan -Repleted  HTN Plan -PRN Hydralazine/Labetalol for Systolic >180 -Resume home cozaar -Hold Minipress   HLD Plan -Resume home statin  DM Plan -DC D10 -Trend Glucose -SSI  Recent C-Spine Fusion by Dr. [name] 10/4 Plan -Neurosurgery following: S/p steroids 10/7   Best Practice (right click and "Reselect all SmartList Selections" daily)   Diet/type: NPO DVT prophylaxis: LMWH GI prophylaxis: H2B Lines: N/A Foley:  N/A Code Status:  full code Last date of multidisciplinary goals of care discussion [pending]  Labs   Reviewed CBC and BMET. Mild hypernatremia  Critical care time: 35 minutes     The patient is critically ill with multiple organ systems failure and requires high complexity decision making for assessment and support, frequent evaluation and titration of therapies, application of advanced monitoring technologies and extensive interpretation of multiple databases.    Mechele Collin, M.D. [facility] Pulmonary/Critical Care Medicine 10/21/2020 9:56 AM   Please see Amion for pager number to reach on-call Pulmonary and Critical Care Team.

## 2020-10-21 NOTE — Progress Notes (Addendum)
PCCM Progress Note  Intermittent episodes of tachycardia reported by RN. Start low dose scheduled metoprolol. Available EKGs with ST and PVCs. Oxygen requirement stable. Currently planned to transfer to [facility] tomorrow and appropriate to transfer our of unit.

## 2020-10-22 DIAGNOSIS — J9601 Acute respiratory failure with hypoxia: Secondary | ICD-10-CM | POA: Diagnosis not present

## 2020-10-22 LAB — BASIC METABOLIC PANEL
Anion gap: 7 (ref 5–15)
Anion gap: 7 (ref 5–15)
BUN: 24 mg/dL — ABNORMAL HIGH (ref 6–20)
BUN: 19 mg/dL (ref 6–20)
CO2: 32 mmol/L (ref 22–32)
CO2: 31 mmol/L (ref 22–32)
Calcium: 10 mg/dL (ref 8.9–10.3)
Calcium: 9.8 mg/dL (ref 8.9–10.3)
Chloride: 120 mmol/L — ABNORMAL HIGH (ref 98–111)
Chloride: 118 mmol/L — ABNORMAL HIGH (ref 98–111)
Creatinine, Ser: 0.78 mg/dL (ref 0.61–1.24)
Creatinine, Ser: 0.76 mg/dL (ref 0.61–1.24)
GFR, Estimated: >60 mL/min (ref >60)
GFR, Estimated: >60 mL/min (ref >60)
Glucose, Bld: 252 mg/dL — ABNORMAL HIGH (ref 70–99)
Glucose, Bld: 205 mg/dL — ABNORMAL HIGH (ref 70–99)
Potassium: 3.6 mmol/L (ref 3.5–5.1)
Potassium: 3.6 mmol/L (ref 3.5–5.1)
Sodium: 159 mmol/L — ABNORMAL HIGH (ref 135–145)
Sodium: 156 mmol/L — ABNORMAL HIGH (ref 135–145)

## 2020-10-22 LAB — GLUCOSE, CAPILLARY
Glucose-Capillary: 164 mg/dL — ABNORMAL HIGH (ref 70–99)
Glucose-Capillary: 228 mg/dL — ABNORMAL HIGH (ref 70–99)
Glucose-Capillary: 259 mg/dL — ABNORMAL HIGH (ref 70–99)
Glucose-Capillary: 203 mg/dL — ABNORMAL HIGH (ref 70–99)
Glucose-Capillary: 166 mg/dL — ABNORMAL HIGH (ref 70–99)
Glucose-Capillary: 219 mg/dL — ABNORMAL HIGH (ref 70–99)

## 2020-10-22 LAB — CBC
HCT: 37 % — ABNORMAL LOW (ref 39.0–52.0)
Hemoglobin: 12.2 g/dL — ABNORMAL LOW (ref 13.0–17.0)
MCH: 32.4 pg (ref 26.0–34.0)
MCHC: 33 g/dL (ref 30.0–36.0)
MCV: 98.4 fL (ref 80.0–100.0)
Platelets: 319 10*3/uL (ref 150–400)
RBC: 3.76 MIL/uL — ABNORMAL LOW (ref 4.22–5.81)
RDW: 14.5 % (ref 11.5–15.5)
WBC: 13.8 10*3/uL — ABNORMAL HIGH (ref 4.0–10.5)
nRBC: 0 % (ref 0.0–0.2)

## 2020-10-22 LAB — MAGNESIUM: Magnesium: 2.2 mg/dL (ref 1.7–2.4)

## 2020-10-22 MED ORDER — INSULIN ASPART 100 UNIT/ML IJ SOLN
4.0000 [IU] | INTRAMUSCULAR | Status: DC
  Administered 2020-10-22 – 2020-10-24 (×11): 4 [IU] via SUBCUTANEOUS

## 2020-10-22 MED ORDER — ZOLPIDEM TARTRATE 5 MG PO TABS
5.0000 mg | ORAL_TABLET | Freq: Every evening | ORAL | Status: DC | PRN
  Administered 2020-10-22: 5 mg
  Filled 2020-10-22: qty 1

## 2020-10-22 MED ORDER — POTASSIUM CHLORIDE 20 MEQ PO PACK
40.0000 meq | PACK | Freq: Once | ORAL | Status: AC
  Administered 2020-10-22: 40 meq
  Filled 2020-10-22: qty 2

## 2020-10-22 MED ORDER — INSULIN GLARGINE-YFGN 100 UNIT/ML ~~LOC~~ SOLN
10.0000 [IU] | Freq: Every day | SUBCUTANEOUS | Status: DC
  Administered 2020-10-22 – 2020-10-23 (×2): 10 [IU] via SUBCUTANEOUS
  Filled 2020-10-22 (×2): qty 0.1

## 2020-10-22 MED ORDER — POTASSIUM CHLORIDE CRYS ER 20 MEQ PO TBCR: 40.0000 meq | EXTENDED_RELEASE_TABLET | Freq: Once | ORAL | Status: DC

## 2020-10-22 MED ORDER — METOPROLOL TARTRATE 12.5 MG HALF TABLET
12.5000 mg | ORAL_TABLET | Freq: Four times a day (QID) | ORAL | Status: DC
  Administered 2020-10-22 – 2020-10-29 (×25): 12.5 mg
  Filled 2020-10-22 (×26): qty 1

## 2020-10-22 MED ORDER — SENNOSIDES-DOCUSATE SODIUM 8.6-50 MG PO TABS
1.0000 | ORAL_TABLET | Freq: Two times a day (BID) | ORAL | Status: DC
  Administered 2020-10-22 – 2020-11-10 (×36): 1
  Filled 2020-10-22 (×36): qty 1

## 2020-10-22 MED ORDER — DEXTROSE 5 % IV SOLN: INTRAVENOUS | Status: DC

## 2020-10-22 MED ORDER — FREE WATER
300.0000 mL | Status: DC
  Administered 2020-10-22 – 2020-10-24 (×13): 300 mL

## 2020-10-22 MED ORDER — POLYETHYLENE GLYCOL 3350 17 G PO PACK
17.0000 g | PACK | Freq: Every day | ORAL | Status: DC
  Administered 2020-10-22 – 2020-10-24 (×3): 17 g via ORAL
  Filled 2020-10-22 (×3): qty 1

## 2020-10-22 NOTE — Progress Notes (Signed)
PROGRESS NOTE    Edward Garner  QVZ:[id number] DOB: [birth year] DOA: 10/19/2020 PCP: Donald Prose, MD   Brief Narrative: 55 year old with past medical history significant for smoker, OSA, hypertension, hyperlipidemia, diabetes type 2, seizure and cervical spondylosis/stenosis status post ACDF on 10/3 presenting from home with confusion.  Patient recently underwent cervical ACDF C3 C6 with Dr. Ronnald Ramp on 10/3 and discharged home on 10/4.  Patient presented to the ER later that day after a syncopal episode at home with negative CT head and cervical CT, requiring scalp laceration repair.  Subsequently was discharged home.  Patient returned to the ER the day of admission due to progressive lethargy and found by EMS with oxygen saturation in the 70s, he was placed on normal breather mask and received Narcan with significant improvement.  Poor oral intake due to ongoing pain and some difficulty with swallowing. Patient was admitted with multifocal pneumonia.  CTA was negative for PE.  CT cervical which showed slightly increased prevertebral swelling compared to 10/4, P diameter of the larynx the level of the epiglottis to 5 mm.  Significant Hospital events: 10/3 cervical ACDF C3-C6 with Dr. Ronnald Ramp, discharged home 10/4 10/4 syncope at home, ER eval neg, discharged home 10/6 Admit to PCCM for acute hypoxic respiratory failure 2/2 multifocal pna, on BiPAP 10/7 On 11L [place]  Assessment & Plan:   Active Problems:   Hypoxia   Acute hypoxemic respiratory failure (HCC)   Malnutrition of moderate degree  1-Acute Hypoxic respiratory failure in the setting of multifocal pneumonia: Concern for aspiration event with prevertebral swelling status post C-spine fusion -History of OSA on CPAP Continue with core track and n.p.o. with speech following Continue with Unasyn Continue with oxygen supplementation  2-Dysphagia: CT with slightly increased prevertebral swelling compared to 10/4 Continue with core track  and tube feeding Continue with dysphagia swallow evaluation  Hypernatremia:; Start D5 IV fluids and will add free water.  Check to be met tonight  Diabetes type 2: Plan to start long acting  insulin and meal coverage.  Seizure disorder: Continue with Depakote Bipolar: Continue with Xanax, Prozac and Wellbutrin Holding Lyrica  Hypomagnesemia: replaced.   Hypertension: Treated with Cozaar and metoprolol Recent C-spine fusion by Dr. Ronnald Ramp 10/4: Neurosurgery following   Nutrition Problem: Moderate Malnutrition Etiology: chronic illness    Signs/Symptoms: moderate fat depletion, moderate muscle depletion    Interventions: MVI, Tube feeding  Estimated body mass index is 21.53 kg/m as calculated from the following:   Height as of this encounter: 6' (1.829 m).   Weight as of this encounter: 72 kg.   DVT prophylaxis: SCD Code Status: Full code Family Communication: Disposition Plan:  Status is: Inpatient  Remains inpatient appropriate because:IV treatments appropriate due to intensity of illness or inability to take PO  Dispo: The patient is from: Home              Anticipated d/c is to: Home              Patient currently is not medically stable to d/c.   Difficult to place patient No        Consultants:  Neurosurgery   Procedures:    Antimicrobials:    Subjective: He is alert, anxious, he is thirsty.    Objective: Vitals:   10/22/20 0400 10/22/20 0500 10/22/20 0600 10/22/20 0700  BP: 131/70 (!) 152/101 (!) 164/93 (!) 155/95  Pulse: 91 98 91 92  Resp: _0 Temp:  TempSrc:      SpO2: 94% 93% (!) 87% 93%  Weight:  72 kg    Height:        Intake/Output Summary (Last 24 hours) at 10/22/2020 0741 Last data filed at 10/22/2020 0600 Gross per 24 hour  Intake 1372.05 ml  Output 2300 ml  Net -927.95 ml   Filed Weights   10/20/20 0500 10/21/20 0500 10/22/20 0500  Weight: 73.8 kg 72.8 kg 72 kg    Examination:  General exam: Appears  calm and comfortable  Respiratory system: Clear to auscultation. Respiratory effort normal. Cardiovascular system: S1 & S2 heard, RRR. No JVD, murmurs, rubs, gallops or clicks. No pedal edema. Gastrointestinal system: Abdomen is nondistended, soft and nontender. No organomegaly or masses felt. Normal bowel sounds heard. Central nervous system: Alert and oriented. No focal neurological deficits. Extremities: Symmetric 5 x 5 power.   Data Reviewed: I have personally reviewed following labs and imaging studies  CBC: Recent Labs  Lab 10/17/20 1638 10/19/20 1421 10/19/20 1438 10/20/20 0306 10/21/20 0453  WBC 11.0* 6.6  --  8.4 11.4*  NEUTROABS 8.5* 4.1  --  4.9  --   HGB 13.3 11.4* 11.2* 11.3* 12.5*  HCT 40.6 34.7* 33.0* 33.9* 36.8*  MCV 99.5 99.7  --  98.3 96.1  PLT 199 183  --  208 810   Basic Metabolic Panel: Recent Labs  Lab 10/16/20 1107 10/17/20 1638 10/19/20 1421 10/19/20 1438 10/20/20 0306 10/20/20 1428 10/20/20 1655 10/21/20 0453 10/21/20 1620  NA 140 141 143 145 144  --   --  148*  --   K 4.2 4.1 4.1 4.1 3.9  --   --  3.7  --   CL 102 105 111  --  112*  --   --  114*  --   CO2  --  27 21*  --  19*  --   --  23  --   GLUCOSE 151* 166* 108*  --  104*  --   --  158*  --   BUN _0 --  18  --   --  27*  --   CREATININE 0.80 0.80 1.11  --  1.10  --   --  0.90  --   CALCIUM  --  9.3 9.2  --  9.5  --   --  10.0  --   MG  --   --   --   --  1.7 1.9 2.1 2.2 2.2  PHOS  --   --   --   --  3.7 3.7 3.6 3.2 2.5   GFR: Estimated Creatinine Clearance: 96.7 mL/min (by C-G formula based on SCr of 0.9 mg/dL). Liver Function Tests: Recent Labs  Lab 10/19/20 1421  AST 19  ALT 14  ALKPHOS 46  BILITOT 1.0  PROT 5.8*  ALBUMIN 2.7*   No results for input(s): LIPASE, AMYLASE in the last 168 hours. No results for input(s): AMMONIA in the last 168 hours. Coagulation Profile: Recent Labs  Lab 10/16/20 1112  INR 1.0   Cardiac Enzymes: No results for input(s):  CKTOTAL, CKMB, CKMBINDEX, TROPONINI in the last 168 hours. BNP (last 3 results) No results for input(s): PROBNP in the last 8760 hours. HbA1C: No results for input(s): HGBA1C in the last 72 hours. CBG: Recent Labs  Lab 10/21/20 1109 10/21/20 1543 10/21/20 1939 10/21/20 2320 10/22/20 0323  GLUCAP 195* 218* 203* 230* 164*   Lipid Profile: No results for input(s): CHOL, HDL,  LDLCALC, TRIG, CHOLHDL, LDLDIRECT in the last 72 hours. Thyroid Function Tests: No results for input(s): TSH, T4TOTAL, FREET4, T3FREE, THYROIDAB in the last 72 hours. Anemia Panel: No results for input(s): VITAMINB12, FOLATE, FERRITIN, TIBC, IRON, RETICCTPCT in the last 72 hours. Sepsis Labs: Recent Labs  Lab 10/19/20 2048  LATICACIDVEN 1.3    Recent Results (from the past 240 hour(s))  SARS Coronavirus 2 by RT PCR (hospital order, performed in [facility] hospital lab) Nasopharyngeal Nasopharyngeal Swab     Status: None   Collection Time: 10/16/20 10:42 AM   Specimen: Nasopharyngeal Swab  Result Value Ref Range Status   SARS Coronavirus 2 NEGATIVE NEGATIVE Final    Comment: (NOTE) SARS-CoV-2 target nucleic acids are NOT DETECTED.  The SARS-CoV-2 RNA is generally detectable in upper and lower respiratory specimens during the acute phase of infection. The lowest concentration of SARS-CoV-2 viral copies this assay can detect is 250 copies / mL. A negative result does not preclude SARS-CoV-2 infection and should not be used as the sole basis for treatment or other patient management decisions.  A negative result may occur with improper specimen collection / handling, submission of specimen other than nasopharyngeal swab, presence of viral mutation(s) within the areas targeted by this assay, and inadequate number of viral copies (<250 copies / mL). A negative result must be combined with clinical observations, patient history, and epidemiological information.  Fact Sheet for Patients:    StrictlyIdeas.no  Fact Sheet for Healthcare Providers: BankingDealers.co.za  This test is not yet approved or  cleared by the [place] FDA and has been authorized for detection and/or diagnosis of SARS-CoV-2 by FDA under an Emergency Use Authorization (EUA).  This EUA will remain in effect (meaning this test can be used) for the duration of the COVID-19 declaration under Section 564(b)(1) of the Act, 21 U.S.C. section 360bbb-3(b)(1), unless the authorization is terminated or revoked sooner.  Performed at [facility], [place] [address], [place], [place] [address]   Surgical pcr screen     Status: None   Collection Time: 10/16/20 11:23 AM   Specimen: Nasal Mucosa; Nasal Swab  Result Value Ref Range Status   MRSA, PCR NEGATIVE NEGATIVE Final   Staphylococcus aureus NEGATIVE NEGATIVE Final    Comment: (NOTE) The Xpert SA Assay (FDA approved for NASAL specimens in patients 42 years of age and older), is one component of a comprehensive surveillance program. It is not intended to diagnose infection nor to guide or monitor treatment. Performed at [facility], [place] [address], [place], [place] [address]   Urine Culture     Status: None   Collection Time: 10/19/20  1:48 PM   Specimen: Urine, Clean Catch  Result Value Ref Range Status   Specimen Description URINE, CLEAN CATCH  Final   Special Requests NONE  Final   Culture   Final    NO GROWTH Performed at [facility], [place] [address] [place], [place] [address]    Report Status 10/20/2020 FINAL  Final  Resp Panel by RT-PCR (Flu A&B, Covid) Nasopharyngeal Swab     Status: None   Collection Time: 10/19/20  2:31 PM   Specimen: Nasopharyngeal Swab; Nasopharyngeal(NP) swabs in vial transport medium  Result Value Ref Range Status   SARS Coronavirus 2 by RT PCR NEGATIVE NEGATIVE Final    Comment: (NOTE) SARS-CoV-2 target nucleic acids are NOT DETECTED.  The  SARS-CoV-2 RNA is generally detectable in upper respiratory specimens during the acute phase  of infection. The lowest concentration of SARS-CoV-2 viral copies this assay can detect is 138 copies/mL. A negative result does not preclude SARS-Cov-2 infection and should not be used as the sole basis for treatment or other patient management decisions. A negative result may occur with  improper specimen collection/handling, submission of specimen other than nasopharyngeal swab, presence of viral mutation(s) within the areas targeted by this assay, and inadequate number of viral copies(<138 copies/mL). A negative result must be combined with clinical observations, patient history, and epidemiological information. The expected result is Negative.  Fact Sheet for Patients:  EntrepreneurPulse.com.au  Fact Sheet for Healthcare Providers:  IncredibleEmployment.be  This test is no t yet approved or cleared by the [place] FDA and  has been authorized for detection and/or diagnosis of SARS-CoV-2 by FDA under an Emergency Use Authorization (EUA). This EUA will remain  in effect (meaning this test can be used) for the duration of the COVID-19 declaration under Section 564(b)(1) of the Act, 21 U.S.C.section 360bbb-3(b)(1), unless the authorization is terminated  or revoked sooner.       Influenza A by PCR NEGATIVE NEGATIVE Final   Influenza B by PCR NEGATIVE NEGATIVE Final    Comment: (NOTE) The Xpert Xpress SARS-CoV-2/FLU/RSV plus assay is intended as an aid in the diagnosis of influenza from Nasopharyngeal swab specimens and should not be used as a sole basis for treatment. Nasal washings and aspirates are unacceptable for Xpert Xpress SARS-CoV-2/FLU/RSV testing.  Fact Sheet for Patients: EntrepreneurPulse.com.au  Fact Sheet for Healthcare Providers: IncredibleEmployment.be  This test is not yet approved or  cleared by the [place] FDA and has been authorized for detection and/or diagnosis of SARS-CoV-2 by FDA under an Emergency Use Authorization (EUA). This EUA will remain in effect (meaning this test can be used) for the duration of the COVID-19 declaration under Section 564(b)(1) of the Act, 21 U.S.C. section 360bbb-3(b)(1), unless the authorization is terminated or revoked.  Performed at [facility], [place] [address], [place],  [address]   Culture, blood (single)     Status: None (Preliminary result)   Collection Time: 10/19/20  2:38 PM   Specimen: BLOOD  Result Value Ref Range Status   Specimen Description BLOOD SITE NOT SPECIFIED  Final   Special Requests   Final    BOTTLES DRAWN AEROBIC ONLY Blood Culture results may not be optimal due to an inadequate volume of blood received in culture bottles   Culture   Final    NO GROWTH 2 DAYS Performed at [facility], [place] [address] [place],  [address]    Report Status PENDING  Incomplete         Radiology Studies: DG Abd Portable 1V  Result Date: 10/20/2020 CLINICAL DATA:  Evaluate feeding tube placement. EXAM: PORTABLE ABDOMEN - 1 VIEW COMPARISON:  05/25/2020 FINDINGS: Feeding tube tip is below the GE junction in the expected location of the body of stomach. Bilateral airspace opacities are identified within the imaged portions of the lungs. Most advanced within the right upper lobe. IMPRESSION: Feeding tube tip is in the body of the stomach. Electronically Signed   By: Kerby Moors M.D.   On: 10/20/2020 11:43        Scheduled Meds:  ALPRAZolam  1 mg Per Tube TID   buPROPion  100 mg Per Tube TID   Chlorhexidine Gluconate Cloth  6 each Topical Daily   famotidine  20 mg Per Tube BID   feeding supplement (PROSource TF)  45 mL Per Tube BID   FLUoxetine  40 mg Per Tube Daily   insulin aspart  0-15 Units Subcutaneous Q4H   ipratropium-albuterol  3 mL Nebulization BID   losartan  50 mg Per Tube  Daily   metoprolol tartrate  12.5 mg Oral Q6H   multivitamin with minerals  1 tablet Per Tube Daily   rosuvastatin  20 mg Per Tube QHS   senna-docusate  1 tablet Per Tube QHS   valproic acid  500 mg Per Tube TID   Continuous Infusions:  ampicillin-sulbactam (UNASYN) IV 3 g (10/22/20 0727)   feeding supplement (JEVITY 1.5 CAL/FIBER) 1,000 mL (10/20/20 1712)     LOS: 3 days    Time spent: 35 minutes.     Elmarie Shiley, MD [facility]   If 7PM-7AM, please contact night-coverage [email or web address]  10/22/2020, 7:41 AM

## 2020-10-22 NOTE — Progress Notes (Signed)
Speech Language Pathology Treatment: Dysphagia  Patient Details Name: Edward Garner MRN: [id number] DOB: 1965-05-15 Today's Date: 10/22/2020 Time: 3382-5053 SLP Time Calculation (min) (ACUTE ONLY): 14 min  Assessment / Plan / Recommendation Clinical Impression  Pt was seen for dysphagia treatment with his mother present. Pt's mother reported that he was more lethargic than when he was seen by SLP on 10/8 and pt's RN stated that Dilaudid has not been given since 1300. Pt demonstrated difficulty maintaining an adequate level of alertness throughout the session and the impact of this on his performance is considered. Bolus manipulation was prolonged with ice chips and minimal with a small <1/2 tsp bolus of puree. Puree boluses were ultimately removed from the oral cavity via suction. Pt demonstrated coughing with ice chips and with water via tsp, suggesting continued aspiration. A wet vocal quality was intermittently noted and improved with coughing. Multiple swallows were observed, indicating possible pharyngeal residue. A modified barium swallow study will likely be necessary to further assess physiology. SLP will follow to determine timing of this based on performance and pt's level of alertness.   HPI HPI: Patient recently underwent cervical ACDF C3-C6 with Dr. [name] on 10/3 and discharged home on 10/4.  Patient presented to ER later that day after syncopal episode at home with negative CTH and cervical CT and required scalp laceration repair.  No other complaints, normal ddimer and hemodynamics and therefore discharged home.  He possibly had a virtual visit with his PCP yesterday with some concern for pneumonia and was started on an unknown antibiotic, only taking one dose.  Returns to ER again today for progressive lethargy at home found by EMS with O2 saturations in the 70's, placed on NRB and no improvement to narcan.  Additionally reported decrease oral intake due to ongoing pain and some  difficulty swallowing, CT cervical showed slightly increased prevertebral swelling compared to 10/4 and narrowed AP diameter of the larynx the level of the epiglottis to 5 mm, CTH negative for acute intracranial process, and CTA PE was negative for PE, but showed multifocal pneumonia and thickening of esophageal wall possibly reflecting esophagitis, and hepatomegaly.      SLP Plan  Continue with current plan of care      Recommendations for follow up therapy are one component of a multi-disciplinary discharge planning process, led by the attending physician.  Recommendations may be updated based on patient status, additional functional criteria and insurance authorization.    Recommendations  Diet recommendations: NPO (with allowance of individual ice chips after oral care) Medication Administration: Via alternative means                Oral Care Recommendations: Oral care QID;Oral care prior to ice chip/H20 Follow up Recommendations:  (TBD) SLP Visit Diagnosis: Dysphagia, oropharyngeal phase (R13.12) Plan: Continue with current plan of care       Leilanny Fluitt I. Vear Clock, MS, CCC-SLP Acute Rehabilitation Services Office number [phone] Pager [phone]                Scheryl Marten  10/22/2020, 4:47 PM

## 2020-10-23 ENCOUNTER — Inpatient Hospital Stay (HOSPITAL_COMMUNITY): Payer: Medicare Other

## 2020-10-23 DIAGNOSIS — Z79899 Other long term (current) drug therapy: Secondary | ICD-10-CM | POA: Diagnosis not present

## 2020-10-23 DIAGNOSIS — J9601 Acute respiratory failure with hypoxia: Secondary | ICD-10-CM | POA: Diagnosis not present

## 2020-10-23 DIAGNOSIS — F05 Delirium due to known physiological condition: Secondary | ICD-10-CM

## 2020-10-23 LAB — BASIC METABOLIC PANEL
Anion gap: 7 (ref 5–15)
Anion gap: 9 (ref 5–15)
Anion gap: 7 (ref 5–15)
BUN: 15 mg/dL (ref 6–20)
BUN: 14 mg/dL (ref 6–20)
BUN: 15 mg/dL (ref 6–20)
CO2: 32 mmol/L (ref 22–32)
CO2: 32 mmol/L (ref 22–32)
CO2: 32 mmol/L (ref 22–32)
Calcium: 9.7 mg/dL (ref 8.9–10.3)
Calcium: 9.5 mg/dL (ref 8.9–10.3)
Calcium: 9.5 mg/dL (ref 8.9–10.3)
Chloride: 116 mmol/L — ABNORMAL HIGH (ref 98–111)
Chloride: 108 mmol/L (ref 98–111)
Chloride: 106 mmol/L (ref 98–111)
Creatinine, Ser: 0.66 mg/dL (ref 0.61–1.24)
Creatinine, Ser: 0.77 mg/dL (ref 0.61–1.24)
Creatinine, Ser: 0.71 mg/dL (ref 0.61–1.24)
GFR, Estimated: >60 mL/min (ref >60)
GFR, Estimated: >60 mL/min (ref >60)
GFR, Estimated: >60 mL/min (ref >60)
Glucose, Bld: 190 mg/dL — ABNORMAL HIGH (ref 70–99)
Glucose, Bld: 302 mg/dL — ABNORMAL HIGH (ref 70–99)
Glucose, Bld: 243 mg/dL — ABNORMAL HIGH (ref 70–99)
Potassium: 3.5 mmol/L (ref 3.5–5.1)
Potassium: 3.7 mmol/L (ref 3.5–5.1)
Potassium: 4.5 mmol/L (ref 3.5–5.1)
Sodium: 155 mmol/L — ABNORMAL HIGH (ref 135–145)
Sodium: 149 mmol/L — ABNORMAL HIGH (ref 135–145)
Sodium: 145 mmol/L (ref 135–145)

## 2020-10-23 LAB — CBC
HCT: 36 % — ABNORMAL LOW (ref 39.0–52.0)
Hemoglobin: 11.7 g/dL — ABNORMAL LOW (ref 13.0–17.0)
MCH: 32.6 pg (ref 26.0–34.0)
MCHC: 32.5 g/dL (ref 30.0–36.0)
MCV: 100.3 fL — ABNORMAL HIGH (ref 80.0–100.0)
Platelets: 294 10*3/uL (ref 150–400)
RBC: 3.59 MIL/uL — ABNORMAL LOW (ref 4.22–5.81)
RDW: 14.6 % (ref 11.5–15.5)
WBC: 11.1 10*3/uL — ABNORMAL HIGH (ref 4.0–10.5)
nRBC: 0 % (ref 0.0–0.2)

## 2020-10-23 LAB — GLUCOSE, CAPILLARY
Glucose-Capillary: 230 mg/dL — ABNORMAL HIGH (ref 70–99)
Glucose-Capillary: 223 mg/dL — ABNORMAL HIGH (ref 70–99)
Glucose-Capillary: 262 mg/dL — ABNORMAL HIGH (ref 70–99)
Glucose-Capillary: 194 mg/dL — ABNORMAL HIGH (ref 70–99)
Glucose-Capillary: 281 mg/dL — ABNORMAL HIGH (ref 70–99)
Glucose-Capillary: 331 mg/dL — ABNORMAL HIGH (ref 70–99)

## 2020-10-23 MED ORDER — OXYCODONE-ACETAMINOPHEN 5-325 MG PO TABS
1.0000 | ORAL_TABLET | Freq: Four times a day (QID) | ORAL | Status: DC | PRN
  Administered 2020-10-23 (×2): 1 via ORAL
  Filled 2020-10-23 (×2): qty 1

## 2020-10-23 MED ORDER — HYDROMORPHONE HCL 1 MG/ML IJ SOLN
0.5000 mg | INTRAMUSCULAR | Status: DC | PRN
  Administered 2020-10-25 – 2020-11-10 (×16): 0.5 mg via INTRAVENOUS
  Filled 2020-10-23 (×2): qty 1
  Filled 2020-10-23: qty 0.5
  Filled 2020-10-23 (×8): qty 1
  Filled 2020-10-23 (×2): qty 0.5
  Filled 2020-10-23: qty 1
  Filled 2020-10-23: qty 0.5
  Filled 2020-10-23: qty 1

## 2020-10-23 MED ORDER — ALPRAZOLAM 0.5 MG PO TABS: 0.5000 mg | ORAL_TABLET | Freq: Every day | ORAL | Status: DC

## 2020-10-23 MED ORDER — DEXAMETHASONE SODIUM PHOSPHATE 4 MG/ML IJ SOLN
4.0000 mg | Freq: Four times a day (QID) | INTRAMUSCULAR | Status: AC
  Administered 2020-10-23 – 2020-10-24 (×4): 4 mg via INTRAVENOUS
  Filled 2020-10-23 (×4): qty 1

## 2020-10-23 MED ORDER — POTASSIUM CHLORIDE 20 MEQ PO PACK
40.0000 meq | PACK | ORAL | Status: AC
Start: 2020-10-23 — End: 2020-10-23
  Administered 2020-10-23 (×2): 40 meq via ORAL
  Filled 2020-10-23 (×2): qty 2

## 2020-10-23 MED ORDER — INSULIN ASPART 100 UNIT/ML IJ SOLN
10.0000 [IU] | Freq: Once | INTRAMUSCULAR | Status: AC
  Administered 2020-10-23: 10 [IU] via SUBCUTANEOUS

## 2020-10-23 MED ORDER — INSULIN GLARGINE-YFGN 100 UNIT/ML ~~LOC~~ SOLN
5.0000 [IU] | Freq: Once | SUBCUTANEOUS | Status: AC
  Administered 2020-10-23: 5 [IU] via SUBCUTANEOUS
  Filled 2020-10-23: qty 0.05

## 2020-10-23 MED ORDER — INSULIN GLARGINE-YFGN 100 UNIT/ML ~~LOC~~ SOLN
15.0000 [IU] | Freq: Every day | SUBCUTANEOUS | Status: DC
  Filled 2020-10-23: qty 0.15

## 2020-10-23 MED ORDER — ALPRAZOLAM 0.5 MG PO TABS
1.0000 mg | ORAL_TABLET | Freq: Two times a day (BID) | ORAL | Status: DC
  Administered 2020-10-23 – 2020-10-24 (×2): 1 mg
  Filled 2020-10-23 (×2): qty 2

## 2020-10-23 MED ORDER — FUROSEMIDE 10 MG/ML IJ SOLN
20.0000 mg | Freq: Once | INTRAMUSCULAR | Status: AC
  Administered 2020-10-23: 20 mg via INTRAVENOUS

## 2020-10-23 MED ORDER — FREE WATER
300.0000 mL | Freq: Once | Status: AC
  Administered 2020-10-23: 300 mL

## 2020-10-23 MED ORDER — FUROSEMIDE 10 MG/ML IJ SOLN
INTRAMUSCULAR | Status: AC
  Filled 2020-10-23: qty 2

## 2020-10-23 NOTE — Progress Notes (Signed)
PROGRESS NOTE    Edward Garner  HYW:[id number] DOB: Mar 20, 1965 DOA: 10/19/2020 PCP: [name], MD   Brief Narrative: 55 year old with past medical history significant for smoker, OSA, hypertension, hyperlipidemia, diabetes type 2, seizure and cervical spondylosis/stenosis status post ACDF on 10/3 presenting from home with confusion.  Patient recently underwent cervical ACDF C3 C6 with Dr. [name] on 10/3 and discharged home on 10/4.  Patient presented to the ER later that day after a syncopal episode at home with negative CT head and cervical CT, requiring scalp laceration repair.  Subsequently was discharged home.  Patient returned to the ER the day of admission due to progressive lethargy and found by EMS with oxygen saturation in the 70s, he was placed on normal breather mask and received Narcan with significant improvement.  Poor oral intake due to ongoing pain and some difficulty with swallowing. Patient was admitted with multifocal pneumonia.  CTA was negative for PE.  CT cervical which showed slightly increased prevertebral swelling compared to 10/4, P diameter of the larynx the level of the epiglottis to 5 mm.  Significant Hospital events: 10/3 cervical ACDF C3-C6 with Dr. [name], discharged home 10/4 10/4 syncope at home, ER eval neg, discharged home 10/6 Admit to PCCM for acute hypoxic respiratory failure 2/2 multifocal pna, on BiPAP 10/7 On 11L [place]  Assessment & Plan:   Active Problems:   Hypoxia   Acute hypoxemic respiratory failure (HCC)   Malnutrition of moderate degree  1-Acute Hypoxic respiratory failure in the setting of multifocal pneumonia: Concern for aspiration event with prevertebral swelling status post C-spine fusion -History of OSA on CPAP Continue with core track and n.p.o. with speech following Continue with Unasyn day 4 /7 treatment for PNA.  Continue with oxygen supplementation.  Increase oxygen requirement this afternoon. He is more lethargic. Plan to get  ABG, portable chest x ray, will ask CCM to re-evaluate. Cancel transfer out of ICU.   2-Dysphagia: CT with slightly increased prevertebral swelling compared to 10/4 Continue with core track and tube feeding Continue with dysphagia swallow evaluation Continue NPO.  Neurosurgery order IV steroids for one day.   Hypernatremia:; Started  D5 IV fluids and added  free water.  Sodium decreasing. Down to 149. Decrease IV fluids rate due to hyperglycemia.   Diabetes type 2: Started long acting  insulin and meal coverage.  Seizure disorder: Continue with Depakote. Depokate level low. Neurology consulted.   Bipolar: Continue with Xanax, Prozac and Wellbutrin Holding Lyrica He reporter  to nurse last night he wants to jump out of window... Psych consulted. He might need sitter if her gets more agitated or delirious.  -psych recommend continue with bupropion valporic acid, fluoxetin.  -hold adderal and Vyvasne.  -Alprazolam change to 1 mg Am/PM and 0.5mg  at 16;00 Psych recommend schedule pain medication, I would avoid schedule opioid today due to concern for increase oxygen requirement.   Hypomagnesemia: replaced.   Hypertension: Treated with Cozaar and metoprolol Recent C-spine fusion by Dr. [name] 10/4: Neurosurgery following   Nutrition Problem: Moderate Malnutrition Etiology: chronic illness    Signs/Symptoms: moderate fat depletion, moderate muscle depletion    Interventions: MVI, Tube feeding  Estimated body mass index is 21.41 kg/m as calculated from the following:   Height as of this encounter: 6' (1.829 m).   Weight as of this encounter: 71.6 kg.   DVT prophylaxis: SCD Code Status: Full code Family Communication: Mother who was at bedside Disposition Plan:  Status is: Inpatient  Remains inpatient appropriate  because:IV treatments appropriate due to intensity of illness or inability to take PO  Dispo: The patient is from: Home              Anticipated d/c is to:  Home              Patient currently is not medically stable to d/c.   Difficult to place patient No        Consultants:  Neurosurgery   Procedures:    Antimicrobials:    Subjective: This afternoon he is more sleepy, now requiring NBM at 15 L.    Objective: Vitals:   10/23/20 0600 10/23/20 0700 10/23/20 0752 10/23/20 0800  BP: (!) 167/95 (!) 165/90  (!) 149/91  Pulse: (!) 109 94  (!) 101  Resp: (!) 25 20  16   Temp:   99.1 F (37.3 C)   TempSrc:   Oral   SpO2: 93% 90%  (!) 89%  Weight:      Height:        Intake/Output Summary (Last 24 hours) at 10/23/2020 1019 Last data filed at 10/23/2020 0937 Gross per 24 hour  Intake 3972.46 ml  Output 3000 ml  Net 972.46 ml    Filed Weights   10/21/20 0500 10/22/20 0500 10/23/20 0340  Weight: 72.8 kg 72 kg 71.6 kg    Examination:  General exam: sleepy.  Respiratory system: BL ronchus.  Cardiovascular system: S 1, S 2 RRR Gastrointestinal system: BS present, soft, nt Central nervous system: sleepy Extremities: No edema   Data Reviewed: I have personally reviewed following labs and imaging studies  CBC: Recent Labs  Lab 10/17/20 1638 10/19/20 1421 10/19/20 1438 10/20/20 0306 10/21/20 0453 10/22/20 0759 10/23/20 0056  WBC 11.0* 6.6  --  8.4 11.4* 13.8* 11.1*  NEUTROABS 8.5* 4.1  --  4.9  --   --   --   HGB 13.3 11.4* 11.2* 11.3* 12.5* 12.2* 11.7*  HCT 40.6 34.7* 33.0* 33.9* 36.8* 37.0* 36.0*  MCV 99.5 99.7  --  98.3 96.1 98.4 100.3*  PLT 199 183  --  208 292 319 294    Basic Metabolic Panel: Recent Labs  Lab 10/20/20 0306 10/20/20 1428 10/20/20 1655 10/21/20 0453 10/21/20 1620 10/22/20 0759 10/22/20 2023 10/23/20 0056 10/23/20 0813  NA 144  --   --  148*  --  159* 156* 155* 149*  K 3.9  --   --  3.7  --  3.6 3.6 3.5 3.7  CL 112*  --   --  114*  --  120* 118* 116* 108  CO2 19*  --   --  23  --  32 31 32 32  GLUCOSE 104*  --   --  158*  --  252* 205* 190* 302*  BUN 18  --   --  27*  --   24* 19 15 14   CREATININE 1.10  --   --  0.90  --  0.78 0.76 0.66 0.77  CALCIUM 9.5  --   --  10.0  --  10.0 9.8 9.7 9.5  MG 1.7 1.9 2.1 2.2 2.2 2.2  --   --   --   PHOS 3.7 3.7 3.6 3.2 2.5  --   --   --   --     GFR: Estimated Creatinine Clearance: 108.1 mL/min (by C-G formula based on SCr of 0.77 mg/dL). Liver Function Tests: Recent Labs  Lab 10/19/20 1421  AST 19  ALT 14  ALKPHOS 46  BILITOT 1.0  PROT 5.8*  ALBUMIN 2.7*    No results for input(s): LIPASE, AMYLASE in the last 168 hours. No results for input(s): AMMONIA in the last 168 hours. Coagulation Profile: Recent Labs  Lab 10/16/20 1112  INR 1.0    Cardiac Enzymes: No results for input(s): CKTOTAL, CKMB, CKMBINDEX, TROPONINI in the last 168 hours. BNP (last 3 results) No results for input(s): PROBNP in the last 8760 hours. HbA1C: No results for input(s): HGBA1C in the last 72 hours. CBG: Recent Labs  Lab 10/22/20 1540 10/22/20 1942 10/22/20 2321 10/23/20 0332 10/23/20 0752  GLUCAP 203* 166* 219* 230* 223*    Lipid Profile: No results for input(s): CHOL, HDL, LDLCALC, TRIG, CHOLHDL, LDLDIRECT in the last 72 hours. Thyroid Function Tests: No results for input(s): TSH, T4TOTAL, FREET4, T3FREE, THYROIDAB in the last 72 hours. Anemia Panel: No results for input(s): VITAMINB12, FOLATE, FERRITIN, TIBC, IRON, RETICCTPCT in the last 72 hours. Sepsis Labs: Recent Labs  Lab 10/19/20 2048  LATICACIDVEN 1.3     Recent Results (from the past 240 hour(s))  SARS Coronavirus 2 by RT PCR (hospital order, performed in [facility] hospital lab) Nasopharyngeal Nasopharyngeal Swab     Status: None   Collection Time: 10/16/20 10:42 AM   Specimen: Nasopharyngeal Swab  Result Value Ref Range Status   SARS Coronavirus 2 NEGATIVE NEGATIVE Final    Comment: (NOTE) SARS-CoV-2 target nucleic acids are NOT DETECTED.  The SARS-CoV-2 RNA is generally detectable in upper and lower respiratory specimens during the acute  phase of infection. The lowest concentration of SARS-CoV-2 viral copies this assay can detect is 250 copies / mL. A negative result does not preclude SARS-CoV-2 infection and should not be used as the sole basis for treatment or other patient management decisions.  A negative result may occur with improper specimen collection / handling, submission of specimen other than nasopharyngeal swab, presence of viral mutation(s) within the areas targeted by this assay, and inadequate number of viral copies (<250 copies / mL). A negative result must be combined with clinical observations, patient history, and epidemiological information.  Fact Sheet for Patients:   BoilerBrush.com.cy  Fact Sheet for Healthcare Providers: https://pope.com/  This test is not yet approved or  cleared by the [place] FDA and has been authorized for detection and/or diagnosis of SARS-CoV-2 by FDA under an Emergency Use Authorization (EUA).  This EUA will remain in effect (meaning this test can be used) for the duration of the COVID-19 declaration under Section 564(b)(1) of the Act, 21 U.S.C. section 360bbb-3(b)(1), unless the authorization is terminated or revoked sooner.  Performed at [facility], [address] [place], [place] [address]   Surgical pcr screen     Status: None   Collection Time: 10/16/20 11:23 AM   Specimen: Nasal Mucosa; Nasal Swab  Result Value Ref Range Status   MRSA, PCR NEGATIVE NEGATIVE Final   Staphylococcus aureus NEGATIVE NEGATIVE Final    Comment: (NOTE) The Xpert SA Assay (FDA approved for NASAL specimens in patients 40 years of age and older), is one component of a comprehensive surveillance program. It is not intended to diagnose infection nor to guide or monitor treatment. Performed at [facility], [address] [place], [place] [address]   Urine Culture     Status: None   Collection Time: 10/19/20  1:48  PM   Specimen: Urine, Clean Catch  Result Value Ref Range Status   Specimen Description URINE, CLEAN CATCH  Final   Special Requests NONE  Final   Culture   Final    NO GROWTH Performed at [facility], [address] [place], [place] [address]    Report Status 10/20/2020 FINAL  Final  Resp Panel by RT-PCR (Flu A&B, Covid) Nasopharyngeal Swab     Status: None   Collection Time: 10/19/20  2:31 PM   Specimen: Nasopharyngeal Swab; Nasopharyngeal(NP) swabs in vial transport medium  Result Value Ref Range Status   SARS Coronavirus 2 by RT PCR NEGATIVE NEGATIVE Final    Comment: (NOTE) SARS-CoV-2 target nucleic acids are NOT DETECTED.  The SARS-CoV-2 RNA is generally detectable in upper respiratory specimens during the acute phase of infection. The lowest concentration of SARS-CoV-2 viral copies this assay can detect is 138 copies/mL. A negative result does not preclude SARS-Cov-2 infection and should not be used as the sole basis for treatment or other patient management decisions. A negative result may occur with  improper specimen collection/handling, submission of specimen other than nasopharyngeal swab, presence of viral mutation(s) within the areas targeted by this assay, and inadequate number of viral copies(<138 copies/mL). A negative result must be combined with clinical observations, patient history, and epidemiological information. The expected result is Negative.  Fact Sheet for Patients:  BloggerCourse.com  Fact Sheet for Healthcare Providers:  SeriousBroker.it  This test is no t yet approved or cleared by the [place] FDA and  has been authorized for detection and/or diagnosis of SARS-CoV-2 by FDA under an Emergency Use Authorization (EUA). This EUA will remain  in effect (meaning this test can be used) for the duration of the COVID-19 declaration under Section 564(b)(1) of the Act, 21 U.S.C.section  360bbb-3(b)(1), unless the authorization is terminated  or revoked sooner.       Influenza A by PCR NEGATIVE NEGATIVE Final   Influenza B by PCR NEGATIVE NEGATIVE Final    Comment: (NOTE) The Xpert Xpress SARS-CoV-2/FLU/RSV plus assay is intended as an aid in the diagnosis of influenza from Nasopharyngeal swab specimens and should not be used as a sole basis for treatment. Nasal washings and aspirates are unacceptable for Xpert Xpress SARS-CoV-2/FLU/RSV testing.  Fact Sheet for Patients: BloggerCourse.com  Fact Sheet for Healthcare Providers: SeriousBroker.it  This test is not yet approved or cleared by the [place] FDA and has been authorized for detection and/or diagnosis of SARS-CoV-2 by FDA under an Emergency Use Authorization (EUA). This EUA will remain in effect (meaning this test can be used) for the duration of the COVID-19 declaration under Section 564(b)(1) of the Act, 21 U.S.C. section 360bbb-3(b)(1), unless the authorization is terminated or revoked.  Performed at [facility], [address], [place], [place] [address]   Culture, blood (single)     Status: None (Preliminary result)   Collection Time: 10/19/20  2:38 PM   Specimen: BLOOD  Result Value Ref Range Status   Specimen Description BLOOD SITE NOT SPECIFIED  Final   Special Requests   Final    BOTTLES DRAWN AEROBIC ONLY Blood Culture results may not be optimal due to an inadequate volume of blood received in culture bottles   Culture   Final    NO GROWTH 4 DAYS Performed at [facility], [address] [place], [place] [address]    Report Status PENDING  Incomplete          Radiology Studies: No results found.      Scheduled Meds:  ALPRAZolam  1 mg Per  Tube TID   buPROPion  100 mg Per Tube TID   Chlorhexidine Gluconate Cloth  6 each Topical Daily   famotidine  20 mg Per Tube BID   feeding supplement (PROSource TF)  45 mL  Per Tube BID   FLUoxetine  40 mg Per Tube Daily   free water  300 mL Per Tube Q4H   free water  300 mL Per Tube Once   insulin aspart  0-15 Units Subcutaneous Q4H   insulin aspart  4 Units Subcutaneous Q4H   [START ON 10/24/2020] insulin glargine-yfgn  15 Units Subcutaneous Daily   insulin glargine-yfgn  5 Units Subcutaneous Once   losartan  50 mg Per Tube Daily   metoprolol tartrate  12.5 mg Per Tube Q6H   multivitamin with minerals  1 tablet Per Tube Daily   polyethylene glycol  17 g Oral Daily   potassium chloride  40 mEq Oral Q4H   rosuvastatin  20 mg Per Tube QHS   senna-docusate  1 tablet Per Tube BID   valproic acid  500 mg Per Tube TID   Continuous Infusions:  ampicillin-sulbactam (UNASYN) IV 200 mL/hr at 10/23/20 0800   dextrose 50 mL/hr at 10/23/20 1012   feeding supplement (JEVITY 1.5 CAL/FIBER) 1,000 mL (10/22/20 2316)     LOS: 4 days    Time spent: 35 minutes.     Alba Cory, MD [facility]   If 7PM-7AM, please contact night-coverage [email or web address]  10/23/2020, 10:19 AM
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
## 2020-10-23 NOTE — Progress Notes (Signed)
NAME:  Edward Garner, MRN:  [id number], DOB:  1965/07/13, LOS: 4 ADMISSION DATE:  10/19/2020, CONSULTATION DATE:  10/19/20 REFERRING MD:  TRH, CHIEF COMPLAINT:  Hypoxic resp failure   History of Present Illness:  55 year old male with prior history of former smoker, OSA, HTN, HLD, DMT2, seizures, and cervical spondylosis/ stenosis s/p ACDF on 10/3 presenting from home with confusion.   Patient recently underwent cervical ACDF C3-C6 with Dr. [name] on 10/3 and discharged home on 10/4.  Patient presented to ER later that day after syncopal episode at home with negative CTH and cervical CT and required scalp laceration repair.  No other complaints, normal ddimer and hemodynamics and therefore discharged home.  He possibly had a virtual visit with his PCP yesterday with some concern for pneumonia and was started on an unknown antibiotic, only taking one dose.  Returns to ER again today for progressive lethargy at home found by EMS with O2 saturations in the 70's, placed on NRB and no improvement to narcan.  Additionally reported decrease oral intake due to ongoing pain and some difficulty swallowing, only taking 2.5 tablets of his hydrocodone today.  In ER, he was afebrile, normotensive, HR 100, rr 24 and on NRB.  Workup notable for BNP 99, normal WBC but mild left shift on differential, Hgb 11.4, neg trop hs x 2, and VBG/ mixed showing 7.3/ 43/ 64/ 22.  UA neg, SARs/ flu neg.  CT cervical showed slightly increased prevertebral swelling compared to 10/4 and narrowed AP diameter of the larynx the level of the epiglottis to 5 mm, CTH negative for acute intracranial process, and CTA PE was negative for PE, but showed multifocal pneumonia and thickening of esophageal wall possibly reflecting esophagitis, and hepatomegaly.   He remains lethargic but given ongoing hypoxia and increasing tachypnea, PCCM consulted for concern of respiratory failure.   Pertinent  Medical History  Former smoker, OSA, HTN, DMT2,  seizures, cervical spondylosis/ stenosis  Significant Hospital Events: Including procedures, antibiotic start and stop dates in addition to other pertinent events   10/3 cervical ACDF C3-C6 with Dr. [name], discharged home 10/4 10/4 syncope at home, ER eval neg, discharged home 10/6 Admit to PCCM for acute hypoxic respiratory failure 2/2 multifocal pna, on BiPAP 10/7 On 11L [place] 10/10 re-consult on 10L at time of eleaving room, CXR with volume loss/atelectasis on L, infiltrate persists on right base  Interim History / Subjective:  Worsening O2 on NRB. Was on room air briefly sats high 80s from low 90s on NRB, placed on slater [place] 10L maintaining sats low 90s. Classic shunt. Bibasilar infiltrates on CXR.  Objective   Blood pressure (!) 161/87, pulse 92, temperature 98.5 F (36.9 C), temperature source Oral, resp. rate 18, height 6' (1.829 m), weight 71.6 kg, SpO2 96 %.        Intake/Output Summary (Last 24 hours) at 10/23/2020 1806 Last data filed at 10/23/2020 1745 Gross per 24 hour  Intake 4277.98 ml  Output 2850 ml  Net 1427.98 ml    Filed Weights   10/21/20 0500 10/22/20 0500 10/23/20 0340  Weight: 72.8 kg 72 kg 71.6 kg   Physical Exam: General: Chronically ill-appearing, no acute distress HENT: [place], AT, OP clear, MMM Eyes: EOMI, no scleral icterus Respiratory:  Greatly Diminished breath sounds bilaterally.  No crackles, wheezing or rales Cardiovascular: RRR, -M/R/G, no JVD GI: BS+, soft, nontender Extremities:-Edema,-tenderness Neuro: speech slurred AAO x2, CNII-XII grossly intact  CXR 10/10 with worsened atelectasis/volume loss on L,  persistent R lower lung field infiltrate compared to 10/7 Na improving but still high Cr stable  Resolved Hospital Problem list    Assessment & Plan:   Acute hypoxemic respiratory failure in the setting of  concern for aspiration, hypoventilation with shunt physiology via atelectasis  H/O OSA on CPAP at HS  Plan -Wean oxygen for goal  SpO2 >90% -Unasyn to complete 7 days -Trial diuresis lasix 20 mg IV x 1 given all free water given, admittedly does not look volume overloaded -minimize opiates, xanax given hypoventilation on exam   Best Practice (right click and "Reselect all SmartList Selections" daily)   Per primary  Labs   Reviewed CBC and BMET. Mild hypernatremia  Critical care time:      Karren Burly, MD [facility] Pulmonary/Critical Care Medicine 10/23/2020 6:06 PM   Please see Amion for pager number to reach on-call Pulmonary and Critical Care Team.

## 2020-10-23 NOTE — Progress Notes (Signed)
Speech Language Pathology Treatment: Dysphagia  Patient Details Name: Edward Garner MRN: [id number] DOB: 1965/11/21 Today's Date: 10/23/2020 Time: 0277-4128 SLP Time Calculation (min) (ACUTE ONLY): 9 min  Assessment / Plan / Recommendation Clinical Impression  Pt more alert today, still confused and speech slurred (improved after oral care). RN has been giving some ice after oral care. Pt continues to have immediate coughing after sips of nectar thick water, though vocal quality does not become progressively more gurgly as it did before. Pts O2 sats do not go above 90 and pt quite restless, constantly in pain. Though pt appears to be improving, he would not be ready for significant eating and drinking at this time. Discussed with MD and RN. Will f/u tomorrow for readiness based on medical stability.    HPI HPI: Patient recently underwent cervical ACDF C3-C6 with Dr. [name] on 10/3 and discharged home on 10/4.  Patient presented to ER later that day after syncopal episode at home with negative CTH and cervical CT and required scalp laceration repair.  No other complaints, normal ddimer and hemodynamics and therefore discharged home.  He possibly had a virtual visit with his PCP yesterday with some concern for pneumonia and was started on an unknown antibiotic, only taking one dose.  Returns to ER again today for progressive lethargy at home found by EMS with O2 saturations in the 70's, placed on NRB and no improvement to narcan.  Additionally reported decrease oral intake due to ongoing pain and some difficulty swallowing, CT cervical showed slightly increased prevertebral swelling compared to 10/4 and narrowed AP diameter of the larynx the level of the epiglottis to 5 mm, CTH negative for acute intracranial process, and CTA PE was negative for PE, but showed multifocal pneumonia and thickening of esophageal wall possibly reflecting esophagitis, and hepatomegaly.      SLP Plan  Continue with  current plan of care      Recommendations for follow up therapy are one component of a multi-disciplinary discharge planning process, led by the attending physician.  Recommendations may be updated based on patient status, additional functional criteria and insurance authorization.    Recommendations  Diet recommendations: Nectar-thick liquid;Other(comment) (ice chips) Liquids provided via: Straw;Teaspoon Medication Administration: Via alternative means                Plan: Continue with current plan of care       GO                Edward Garner, [name]  10/23/2020, 10:08 AM
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
## 2020-10-23 NOTE — Progress Notes (Signed)
He is doing ok from surgical standpoint, some neck soreness, neuro stable, maex4, incision looks fine with mild swelling, voice a little hoarse. Will try another day of steroids as he tolerated them well

## 2020-10-24 ENCOUNTER — Inpatient Hospital Stay (HOSPITAL_COMMUNITY): Payer: Medicare Other

## 2020-10-24 DIAGNOSIS — R0902 Hypoxemia: Secondary | ICD-10-CM

## 2020-10-24 DIAGNOSIS — Z79899 Other long term (current) drug therapy: Secondary | ICD-10-CM | POA: Diagnosis not present

## 2020-10-24 DIAGNOSIS — R1310 Dysphagia, unspecified: Secondary | ICD-10-CM | POA: Diagnosis not present

## 2020-10-24 DIAGNOSIS — J9601 Acute respiratory failure with hypoxia: Secondary | ICD-10-CM | POA: Diagnosis not present

## 2020-10-24 DIAGNOSIS — F05 Delirium due to known physiological condition: Secondary | ICD-10-CM | POA: Diagnosis not present

## 2020-10-24 LAB — CBC
HCT: 34.1 % — ABNORMAL LOW (ref 39.0–52.0)
Hemoglobin: 11.3 g/dL — ABNORMAL LOW (ref 13.0–17.0)
MCH: 32 pg (ref 26.0–34.0)
MCHC: 33.1 g/dL (ref 30.0–36.0)
MCV: 96.6 fL (ref 80.0–100.0)
Platelets: 297 10*3/uL (ref 150–400)
RBC: 3.53 MIL/uL — ABNORMAL LOW (ref 4.22–5.81)
RDW: 13.6 % (ref 11.5–15.5)
WBC: 10.5 10*3/uL (ref 4.0–10.5)
nRBC: 0.2 % (ref 0.0–0.2)

## 2020-10-24 LAB — COMPREHENSIVE METABOLIC PANEL
ALT: 127 U/L — ABNORMAL HIGH (ref 0–44)
AST: 58 U/L — ABNORMAL HIGH (ref 15–41)
Albumin: 2 g/dL — ABNORMAL LOW (ref 3.5–5.0)
Alkaline Phosphatase: 96 U/L (ref 38–126)
Anion gap: 11 (ref 5–15)
BUN: 24 mg/dL — ABNORMAL HIGH (ref 6–20)
CO2: 36 mmol/L — ABNORMAL HIGH (ref 22–32)
Calcium: 9.8 mg/dL (ref 8.9–10.3)
Chloride: 96 mmol/L — ABNORMAL LOW (ref 98–111)
Creatinine, Ser: 0.8 mg/dL (ref 0.61–1.24)
GFR, Estimated: >60 mL/min (ref >60)
Glucose, Bld: 379 mg/dL — ABNORMAL HIGH (ref 70–99)
Potassium: 4.5 mmol/L (ref 3.5–5.1)
Sodium: 143 mmol/L (ref 135–145)
Total Bilirubin: 0.4 mg/dL (ref 0.3–1.2)
Total Protein: 5.8 g/dL — ABNORMAL LOW (ref 6.5–8.1)

## 2020-10-24 LAB — GLUCOSE, CAPILLARY
Glucose-Capillary: 202 mg/dL — ABNORMAL HIGH (ref 70–99)
Glucose-Capillary: 211 mg/dL — ABNORMAL HIGH (ref 70–99)
Glucose-Capillary: 264 mg/dL — ABNORMAL HIGH (ref 70–99)
Glucose-Capillary: 296 mg/dL — ABNORMAL HIGH (ref 70–99)
Glucose-Capillary: 339 mg/dL — ABNORMAL HIGH (ref 70–99)
Glucose-Capillary: 450 mg/dL — ABNORMAL HIGH (ref 70–99)

## 2020-10-24 LAB — CULTURE, BLOOD (SINGLE): Culture: NO GROWTH

## 2020-10-24 LAB — VALPROIC ACID LEVEL: Valproic Acid Lvl: 37 ug/mL — ABNORMAL LOW (ref 50.0–100.0)

## 2020-10-24 LAB — AMMONIA: Ammonia: 22 umol/L (ref 9–35)

## 2020-10-24 MED ORDER — INSULIN ASPART 100 UNIT/ML IJ SOLN
7.0000 [IU] | INTRAMUSCULAR | Status: DC
  Administered 2020-10-24 – 2020-10-28 (×26): 7 [IU] via SUBCUTANEOUS

## 2020-10-24 MED ORDER — OXYCODONE-ACETAMINOPHEN 5-325 MG PO TABS
1.0000 | ORAL_TABLET | Freq: Four times a day (QID) | ORAL | Status: DC | PRN
  Administered 2020-10-24 – 2020-10-25 (×4): 2
  Administered 2020-10-25 – 2020-10-26 (×2): 1
  Administered 2020-10-26 – 2020-10-31 (×5): 2
  Administered 2020-11-01 – 2020-11-02 (×2): 1
  Administered 2020-11-03 – 2020-11-05 (×6): 2
  Administered 2020-11-06: 1
  Administered 2020-11-06 – 2020-11-08 (×2): 2
  Filled 2020-10-24: qty 2
  Filled 2020-10-24 (×2): qty 1
  Filled 2020-10-24 (×9): qty 2
  Filled 2020-10-24: qty 1
  Filled 2020-10-24 (×5): qty 2
  Filled 2020-10-24: qty 1
  Filled 2020-10-24 (×2): qty 2
  Filled 2020-10-24: qty 1
  Filled 2020-10-24: qty 2

## 2020-10-24 MED ORDER — INSULIN GLARGINE-YFGN 100 UNIT/ML ~~LOC~~ SOLN
25.0000 [IU] | Freq: Every day | SUBCUTANEOUS | Status: DC
  Administered 2020-10-24: 25 [IU] via SUBCUTANEOUS
  Filled 2020-10-24 (×2): qty 0.25

## 2020-10-24 MED ORDER — LIP MEDEX EX OINT
TOPICAL_OINTMENT | CUTANEOUS | Status: DC | PRN
  Filled 2020-10-24: qty 7

## 2020-10-24 MED ORDER — POLYETHYLENE GLYCOL 3350 17 G PO PACK
17.0000 g | PACK | Freq: Every day | ORAL | Status: DC
  Administered 2020-10-25 – 2020-11-10 (×16): 17 g
  Filled 2020-10-24 (×15): qty 1

## 2020-10-24 MED ORDER — LORAZEPAM 2 MG/ML IJ SOLN
2.0000 mg | Freq: Two times a day (BID) | INTRAMUSCULAR | Status: DC
  Administered 2020-10-24 – 2020-10-30 (×13): 2 mg via INTRAVENOUS
  Filled 2020-10-24 (×13): qty 1

## 2020-10-24 MED ORDER — LORAZEPAM 2 MG/ML IJ SOLN
1.0000 mg | Freq: Every day | INTRAMUSCULAR | Status: DC
  Administered 2020-10-24 – 2020-10-29 (×7): 1 mg via INTRAVENOUS
  Filled 2020-10-24 (×6): qty 1

## 2020-10-24 MED ORDER — FREE WATER
200.0000 mL | Status: AC
  Administered 2020-10-24 – 2020-11-10 (×98): 200 mL

## 2020-10-24 NOTE — Plan of Care (Addendum)
Brief Neurology note:  The patient has a feeding tube and valproic acid switched to liquid last night.   Please call for questions.  Marisue Humble, MD Stroke Neurology Page: [phone]

## 2020-10-24 NOTE — Consult Note (Signed)
[facility] New Psychiatric Evaluation   Service Date: October 24, 2020 LOS:  LOS: 5 days    Assessment  [facility] is a 55 y.o. male admitted medically for 10/19/2020 12:55 PM for aspiration pneumonia after a spinal procedure. He carries the psychiatric diagnoses of schizoaffective disorder vs bipolar disorder (per mom), ADHD and anxiety and has a past medical history of  adrenal disease, bipolar disorder, diabetes, hyperlipidemia, sleep apnea and spinal stenosis. Psychiatry was consulted for suicidal statments by [name], MD.   His current presentation of waxing and waning levels of attention and orientation is most consistent with delirium. He does carry the diagnosis of , and has a history of pill seeking behavior. Current outpatient psychotropic medications include alprazolam, adderall, buproprion, depakote, prozac, prazosin, vyvanse, zolpidem and historically he has had a poor response to these medications; family has significant concern for overall decompensation over past 2-3 years and overmedication/pill-seeking behavior. He was compliant with medications prior to admission. On initial examination, patient was unable to engage in a nuanced discussion of medications due to his delirium. Overall we are working towards minimizing deliriogenic meds. Please see plan below for detailed recommendations.   10/11: pt became hyperfocused on short acting xanax not covering anxiety (gets 3 mg ER in AM at home). Not amenable to discussion that he is getting IR TID, eventually agreeable to transition to ativan at roughly the BZD equivalent dose. Balancing respiratory suppression with anxiety difficult in this pt on chronic BZD, stimulant, Z drug and opioids.   Diagnoses:  Active Hospital problems: Active Problems:   Hypoxemia   Acute respiratory failure with hypoxia (HCC)   Malnutrition of moderate degree   Delirium due to another medical condition   Polypharmacy    Dysphagia    Problems edited/added by me: No problems updated.  Plan  ## Safety and Observation Level:  - Based on my clinical evaluation, I estimate the patient to be at low risk of self harm in the current setting - no sitter needed currently, please order a sitter if he downgrades to a different unit (nurses with LOS to pt, current risk is largely d/t pulling at lines)  ## Medications:  --Standing: CONTINUE:   - buproprion 100 mg TID  - valproic acid 500 mg TID  - fluoxetine 40 mg qD  Continue to hold adderall/vyvasne STOP alprazolam: START lorazepam 2 mg/1 mg/2 mg  STOP:   - zolpidem  Avoiding antipsychotics for not in pt on therapeutic depakote, diagnosis of bipolar spectrum disorder, and hx tardive dyskinesia   Consider scheduling some of his opioid regimen rather than having  it be PRN. In the setting of delirium, patients often do not consistently and accurately interpret pain and/or request pain medications. While opioids may contribute to delirium -- inadequate pain control and getting behind on pain is also likely to contribute to delirium and worsen presentation. Please consider starting scheduled pain regimen as respiratory status improves that is appropriate based on this patients particular cause(s) of pain and prior exposure to opioids and non-opioids. Adjust scheduled pain regimen as indicated by PRN use and clinical presentation. This must be balanced against decreased respiratory drive.   We are generally making medication changes to reduce sedative burden in this patient (reducing xanax, stopping ambien).   ## Medical Decision Making Capacity:  Not specifically assessed  ## Further Work-up:  -- to be seen by neuro  ## Disposition:  --  no psychiatric contraindications to discharge at this time  ##  Behavioral / Environmental:  -- ordering delirium precautions   Thank you for this consult request. Recommendations have been communicated to the primary  team.  We will continue to follow at this time.   Tavaris Eudy A Kenni Newton    NEW   Relevant Aspects of Hospital Course:  Admitted on 10/19/2020 for AMS and was found to have aspiration pneumonia.  Patient Report:  Patient seen in late AM; oriented to self, situation and year (improved from yesterday). Discussed some of his medications. He becomes agitated when we talked about his Xanax (takes 3 mg ER at home, 1 mg IR TID here) and perseverates on leaving the hospital or just "jumping out of the window" if he (did deny SI later - this was clearly said in frustration). We eventually agreed to convert alprazolam to a roughly equivalent dose of lorazepam as alprazolam ER is not on formulary (and furthermore would not load all BZD at same time given risk of respiratory compromise).   I reiterated the plan to start lorazepam and cut the interview short as my presence had become agitating to pt. Denied SI, HI, AH/VH.    ROS:  Unable to complete due to level of agitation. Complained of hunger and thirst.   Collateral information:  Mom says he had generally been doing better, less agitated, seemed less confused (prior to assessment above)  Psychiatric History:  Information collected from pt, mother  Medical History: Past Medical History:  Diagnosis Date   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HCC)    Bipolar disorder (HCC)    Complication of anesthesia    Diabetes mellitus without complication (HCC)    Hyperlipidemia    Hypertension    Kidney disease    PONV (postoperative nausea and vomiting)    Sleep apnea    Sleep apnea, unspecified 10/19/2018   Spinal stenosis     Surgical History: Past Surgical History:  Procedure Laterality Date   ANTERIOR CERVICAL DECOMP/DISCECTOMY FUSION N/A 10/16/2020   Procedure: ACDF - C3-C4 - C4-C5 - C5-C6;  Surgeon: Tia Alert, MD;  Location: [facility] OR;  Service: Neurosurgery;  Laterality: N/A;   TONSILLECTOMY      Medications:   Current Facility-Administered  Medications:    Ampicillin-Sulbactam (UNASYN) 3 g in sodium chloride 0.9 % 100 mL IVPB, 3 g, Intravenous, Q6H, [name], MD, Stopped at 10/24/20 1328   buPROPion [facility]) tablet 100 mg, 100 mg, Per Tube, TID, [name], MD, 100 mg at 10/24/20 1541   Chlorhexidine Gluconate Cloth 2 % PADS 6 each, 6 each, Topical, Daily, Mannam, Praveen, MD, 6 each at 10/24/20 0000   docusate (COLACE) 50 MG/5ML liquid 100 mg, 100 mg, Per Tube, BID PRN, [name], MD   famotidine (PEPCID) tablet 20 mg, 20 mg, Per Tube, BID, [name], MD, 20 mg at 10/24/20 1006   feeding supplement (JEVITY 1.5 CAL/FIBER) liquid 1,000 mL, 1,000 mL, Per Tube, Continuous, [name], MD, Last Rate: 65 mL/hr at 10/24/20 1211, 1,000 mL at 10/24/20 1211   feeding supplement (PROSource TF) liquid 45 mL, 45 mL, Per Tube, BID, [name], MD, 45 mL at 10/24/20 1007   FLUoxetine (PROZAC) 20 MG/5ML solution 40 mg, 40 mg, Per Tube, Daily, [name], MD, 40 mg at 10/24/20 1007   free water 200 mL, 200 mL, Per Tube, Q4H, Regalado, Belkys A, MD, 200 mL at 10/24/20 1542   hydrALAZINE (APRESOLINE) injection 10 mg, 10 mg, Intravenous, Q4H PRN, Tobey Grim, NP  HYDROmorphone (DILAUDID) injection 0.5 mg, 0.5 mg, Intravenous, Q4H PRN, Regalado, Belkys A, MD   insulin aspart (novoLOG) injection 0-15 Units, 0-15 Units, Subcutaneous, Q4H, Eubanks, Katalina M, NP, 5 Units at 10/24/20 1540   insulin aspart (novoLOG) injection 7 Units, 7 Units, Subcutaneous, Q4H, Whiteheart, Kathryn A, NP, 7 Units at 10/24/20 1542   insulin glargine-yfgn (SEMGLEE) injection 25 Units, 25 Units, Subcutaneous, Daily, Whiteheart, Kathryn A, NP, 25 Units at 10/24/20 1208   ipratropium-albuterol (DUONEB) 0.5-2.5 (3) MG/3ML nebulizer solution 3 mL, 3 mL, Nebulization, Q6H PRN, Mikey College T, MD   labetalol (NORMODYNE) injection 10 mg, 10 mg, Intravenous, Q2H PRN, Janyth Contes, Katalina M, NP   lip balm (CARMEX) ointment, ,  Topical, PRN, Rica Mote, MD   LORazepam (ATIVAN) injection 1 mg, 1 mg, Intravenous, q1600, Denora Wysocki A   LORazepam (ATIVAN) injection 2 mg, 2 mg, Intravenous, BID, Bristal Steffy A, 2 mg at 10/24/20 1252   losartan (COZAAR) tablet 50 mg, 50 mg, Per Tube, Daily, [name], MD, 50 mg at 10/24/20 1007   metoprolol tartrate (LOPRESSOR) tablet 12.5 mg, 12.5 mg, Per Tube, Q6H, Regalado, Belkys A, MD, 12.5 mg at 10/24/20 1208   multivitamin with minerals tablet 1 tablet, 1 tablet, Per Tube, Daily, [name], MD, 1 tablet at 10/24/20 1007   ondansetron (ZOFRAN) injection 4 mg, 4 mg, Intravenous, Q6H PRN, Mikey College T, MD   oxyCODONE-acetaminophen (PERCOCET/ROXICET) 5-325 MG per tablet 1-2 tablet, 1-2 tablet, Per Tube, Q6H PRN, Regalado, Belkys A, MD, 2 tablet at 10/24/20 1541   polyethylene glycol (MIRALAX / GLYCOLAX) packet 17 g, 17 g, Per Tube, Daily PRN, [name], MD   [START ON 10/25/2020] polyethylene glycol (MIRALAX / GLYCOLAX) packet 17 g, 17 g, Per Tube, Daily, Regalado, Belkys A, MD   rosuvastatin (CRESTOR) tablet 20 mg, 20 mg, Per Tube, QHS, [name], MD, 20 mg at 10/23/20 2110   senna-docusate (Senokot-S) tablet 1 tablet, 1 tablet, Per Tube, BID, Regalado, Belkys A, MD, 1 tablet at 10/24/20 1003   valproic acid (DEPAKENE) 250 MG/5ML solution 500 mg, 500 mg, Per Tube, TID, [name], MD, 500 mg at 10/24/20 1541  Allergies: Allergies  Allergen Reactions   Gramineae Pollens     Social History:  See HPI  Family History:  The patient's family history includes Bipolar disorder in his father; Cancer in his father; Diabetes in his father; Hyperlipidemia in his father; Hypertension in his father; Non-Hodgkin's lymphoma in his mother; Thyroid disease in his father.    Objective  Vital signs:  Temp:  [97.9 F (36.6 C)-98.2 F (36.8 C)] 97.9 F (36.6 C) (10/11 1504) Pulse Rate:  [71-97] 74 (10/11 1500) Resp:  [11-27] 19  (10/11 1300) BP: (107-161)/(63-109) 123/83 (10/11 1500) SpO2:  [89 %-99 %] 97 % (10/11 1500) Weight:  [71.8 kg] 71.8 kg (10/11 0458)  Physical Exam: Gen: Normocephalic/atraumatic Pulm: on [place], occasionally pulls at it, responsive to redirection Neuro: CNII-XII grossly intact Psych: disoriented, improving  Mental Status Exam: Appearance: Ill appearing and pale  Attitude:  demanding  Behavior/Psychomotor: Psychomotor restlessness, pulling at . Abnormal orofacial movements noted today c/w tardive dyskinesia (pt in and out of sleep yesterday)  Speech/Language:  Increased rate, volume  Mood: Frustrated  Affect: Congruent   Thought process: Loose connections between thoughts, perseverative, returning to BZD frequently  Thought content:   Devoid of SI/HI  Perceptual disturbances:  Not endorsed, not overtly RIS  Attention: poor  Concentration: poor  Orientation: Self/situation  Memory: poor  Fund of knowledge:  Did not formally assess  Insight:   poor  Judgment:  poor  Impulse Control: poor

## 2020-10-24 NOTE — Progress Notes (Signed)
PROGRESS NOTE    Edward Garner  WUJ:[id number] DOB: 09/25/1965 DOA: 10/19/2020 PCP: [name], MD   Brief Narrative: 55 year old with past medical history significant for smoker, OSA, hypertension, hyperlipidemia, diabetes type 2, seizure and cervical spondylosis/stenosis status post ACDF on 10/3 presenting from home with confusion.  Patient recently underwent cervical ACDF C3 C6 with Dr. [name] on 10/3 and discharged home on 10/4.  Patient presented to the ER later that day after a syncopal episode at home with negative CT head and cervical CT, requiring scalp laceration repair.  Subsequently was discharged home.  Patient returned to the ER the day of admission due to progressive lethargy and found by EMS with oxygen saturation in the 70s, he was placed on normal breather mask and received Narcan with significant improvement.  Poor oral intake due to ongoing pain and some difficulty with swallowing. Patient was admitted with multifocal pneumonia.  CTA was negative for PE.  CT cervical which showed slightly increased prevertebral swelling compared to 10/4, P diameter of the larynx the level of the epiglottis to 5 mm.  Significant Hospital events: 10/3 cervical ACDF C3-C6 with Dr. [name], discharged home 10/4 10/4 syncope at home, ER eval neg, discharged home 10/6 Admit to PCCM for acute hypoxic respiratory failure 2/2 multifocal pna, on BiPAP 10/7 On 11L [place]  Assessment & Plan:   Active Problems:   Hypoxemia   Acute respiratory failure with hypoxia (HCC)   Malnutrition of moderate degree   Delirium due to another medical condition   Polypharmacy   Dysphagia  1-Acute Hypoxic respiratory failure in the setting of multifocal pneumonia: Concern for aspiration event with prevertebral swelling status post C-spine fusion -History of OSA on CPAP Continue with core track and n.p.o. with speech following Continue with Unasyn day 5 /7 treatment for PNA.  Continue with oxygen supplementation.   Increase oxygen requirement 10/11.  He was more lethargic.  CCM re-consulted. Worsening hypoxemia thought to be related to hypoventilation/atelectasis.  Incentive spirometry.   2-Dysphagia: CT with slightly increased prevertebral swelling compared to 10/4 Continue with core track and tube feeding Continue NPO. Speech following.  Received IV steroids.   Hypernatremia:; Resolved with free water and D 5 IV fluids.   Diabetes type 2: Started long acting  insulin and meal coverage. Hopefully will improve after stopping D 5 IV fluids and steroids.   Seizure disorder: Continue with Depakote. Change to liquid form.    Bipolar: Continue with Prozac and Wellbutrin. Holding Lyrica He reporter  to nurse last night he wants to jump out of window.. -psych recommend continue with bupropion valporic acid, fluoxetin.  -hold adderal and Vyvasne.  -Alprazolam change to ativan.  -Psych recommend schedule pain medication, I would avoid schedule opioid today due to concern for increase oxygen requirement.  -If patient is transfer to progressive bed, he will need sitter per psych recommendation. Order for sitter in placed.   Hypomagnesemia: replaced.   Hypertension: Treated with Cozaar and metoprolol Recent C-spine fusion by Dr. [name] 10/4: Neurosurgery following   Nutrition Problem: Moderate Malnutrition Etiology: chronic illness    Signs/Symptoms: moderate fat depletion, moderate muscle depletion    Interventions: MVI, Tube feeding  Estimated body mass index is 21.47 kg/m as calculated from the following:   Height as of this encounter: 6' (1.829 m).   Weight as of this encounter: 71.8 kg.   DVT prophylaxis: SCD Code Status: Full code Family Communication: Mother who was at bedside Disposition Plan:  Status is: Inpatient  Remains  inpatient appropriate because:IV treatments appropriate due to intensity of illness or inability to take PO  Dispo: The patient is from: Home               Anticipated d/c is to: Home              Patient currently is not medically stable to d/c.   Difficult to place patient No        Consultants:  Neurosurgery   Procedures:    Antimicrobials:    Subjective: He is more alert this am. Down on 9 L oxygen HF.  He is complaining of pain. He is confuse.  He is agitated at times.   Objective: Vitals:   10/24/20 0600 10/24/20 0700 10/24/20 0744 10/24/20 0800  BP: 139/81 124/82  136/79  Pulse: 82 78  80  Resp: 14 19  20   Temp:   98.2 F (36.8 C)   TempSrc:   Oral   SpO2: 95% (!) 89%  95%  Weight:      Height:        Intake/Output Summary (Last 24 hours) at 10/24/2020 1011 Last data filed at 10/24/2020 0800 Gross per 24 hour  Intake 4237.32 ml  Output 4150 ml  Net 87.32 ml    Filed Weights   10/22/20 0500 10/23/20 0340 10/24/20 0458  Weight: 72 kg 71.6 kg 71.8 kg    Examination:  General exam: Alert Respiratory system: BL air movement, no wheezing  Cardiovascular system: S 1, S 2 RRR Gastrointestinal system: BS present, soft, nt Central nervous system: alert, confuse Extremities: no edema   Data Reviewed: I have personally reviewed following labs and imaging studies  CBC: Recent Labs  Lab 10/17/20 1638 10/19/20 1421 10/19/20 1438 10/20/20 0306 10/21/20 0453 10/22/20 0759 10/23/20 0056 10/24/20 0618  WBC 11.0* 6.6  --  8.4 11.4* 13.8* 11.1* 10.5  NEUTROABS 8.5* 4.1  --  4.9  --   --   --   --   HGB 13.3 11.4*   < > 11.3* 12.5* 12.2* 11.7* 11.3*  HCT 40.6 34.7*   < > 33.9* 36.8* 37.0* 36.0* 34.1*  MCV 99.5 99.7  --  98.3 96.1 98.4 100.3* 96.6  PLT 199 183  --  208 292 319 294 297   < > = values in this interval not displayed.    Basic Metabolic Panel: Recent Labs  Lab 10/20/20 0306 10/20/20 1428 10/20/20 1655 10/21/20 0453 10/21/20 1620 10/22/20 0759 10/22/20 2023 10/23/20 0056 10/23/20 0813 10/23/20 1833 10/24/20 0618  NA 144  --   --  148*  --  159* 156* 155* 149* 145 143  K  3.9  --   --  3.7  --  3.6 3.6 3.5 3.7 4.5 4.5  CL 112*  --   --  114*  --  120* 118* 116* 108 106 96*  CO2 19*  --   --  23  --  32 31 32 32 32 36*  GLUCOSE 104*  --   --  158*  --  252* 205* 190* 302* 243* 379*  BUN 18  --   --  27*  --  24* 19 15 14 15  24*  CREATININE 1.10  --   --  0.90  --  0.78 0.76 0.66 0.77 0.71 0.80  CALCIUM 9.5  --   --  10.0  --  10.0 9.8 9.7 9.5 9.5 9.8  MG 1.7 1.9 2.1 2.2 2.2 2.2  --   --   --   --   --  PHOS 3.7 3.7 3.6 3.2 2.5  --   --   --   --   --   --     GFR: Estimated Creatinine Clearance: 108.4 mL/min (by C-G formula based on SCr of 0.8 mg/dL). Liver Function Tests: Recent Labs  Lab 10/19/20 1421 10/24/20 0618  AST 19 58*  ALT 14 127*  ALKPHOS 46 96  BILITOT 1.0 0.4  PROT 5.8* 5.8*  ALBUMIN 2.7* 2.0*    No results for input(s): LIPASE, AMYLASE in the last 168 hours. No results for input(s): AMMONIA in the last 168 hours. Coagulation Profile: No results for input(s): INR, PROTIME in the last 168 hours.  Cardiac Enzymes: No results for input(s): CKTOTAL, CKMB, CKMBINDEX, TROPONINI in the last 168 hours. BNP (last 3 results) No results for input(s): PROBNP in the last 8760 hours. HbA1C: No results for input(s): HGBA1C in the last 72 hours. CBG: Recent Labs  Lab 10/23/20 1543 10/23/20 1934 10/23/20 2308 10/24/20 0305 10/24/20 0742  GLUCAP 194* 281* 331* 296* 450*    Lipid Profile: No results for input(s): CHOL, HDL, LDLCALC, TRIG, CHOLHDL, LDLDIRECT in the last 72 hours. Thyroid Function Tests: No results for input(s): TSH, T4TOTAL, FREET4, T3FREE, THYROIDAB in the last 72 hours. Anemia Panel: No results for input(s): VITAMINB12, FOLATE, FERRITIN, TIBC, IRON, RETICCTPCT in the last 72 hours. Sepsis Labs: Recent Labs  Lab 10/19/20 2048  LATICACIDVEN 1.3     Recent Results (from the past 240 hour(s))  SARS Coronavirus 2 by RT PCR (hospital order, performed in [facility] hospital lab) Nasopharyngeal Nasopharyngeal Swab      Status: None   Collection Time: 10/16/20 10:42 AM   Specimen: Nasopharyngeal Swab  Result Value Ref Range Status   SARS Coronavirus 2 NEGATIVE NEGATIVE Final    Comment: (NOTE) SARS-CoV-2 target nucleic acids are NOT DETECTED.  The SARS-CoV-2 RNA is generally detectable in upper and lower respiratory specimens during the acute phase of infection. The lowest concentration of SARS-CoV-2 viral copies this assay can detect is 250 copies / mL. A negative result does not preclude SARS-CoV-2 infection and should not be used as the sole basis for treatment or other patient management decisions.  A negative result may occur with improper specimen collection / handling, submission of specimen other than nasopharyngeal swab, presence of viral mutation(s) within the areas targeted by this assay, and inadequate number of viral copies (<250 copies / mL). A negative result must be combined with clinical observations, patient history, and epidemiological information.  Fact Sheet for Patients:   BoilerBrush.com.cy  Fact Sheet for Healthcare Providers: https://pope.com/  This test is not yet approved or  cleared by the [place] FDA and has been authorized for detection and/or diagnosis of SARS-CoV-2 by FDA under an Emergency Use Authorization (EUA).  This EUA will remain in effect (meaning this test can be used) for the duration of the COVID-19 declaration under Section 564(b)(1) of the Act, 21 U.S.C. section 360bbb-3(b)(1), unless the authorization is terminated or revoked sooner.  Performed at [facility], [address] [place], [place] [address]   Surgical pcr screen     Status: None   Collection Time: 10/16/20 11:23 AM   Specimen: Nasal Mucosa; Nasal Swab  Result Value Ref Range Status   MRSA, PCR NEGATIVE NEGATIVE Final   Staphylococcus aureus NEGATIVE NEGATIVE Final    Comment: (NOTE) The Xpert SA Assay (FDA approved for  NASAL specimens in patients 67 years of age and older), is one component  of a comprehensive surveillance program. It is not intended to diagnose infection nor to guide or monitor treatment. Performed at [facility], [address] [place], [place] [address]   Urine Culture     Status: None   Collection Time: 10/19/20  1:48 PM   Specimen: Urine, Clean Catch  Result Value Ref Range Status   Specimen Description URINE, CLEAN CATCH  Final   Special Requests NONE  Final   Culture   Final    NO GROWTH Performed at [facility], [address], [place], [place] [address]    Report Status 10/20/2020 FINAL  Final  Resp Panel by RT-PCR (Flu A&B, Covid) Nasopharyngeal Swab     Status: None   Collection Time: 10/19/20  2:31 PM   Specimen: Nasopharyngeal Swab; Nasopharyngeal(NP) swabs in vial transport medium  Result Value Ref Range Status   SARS Coronavirus 2 by RT PCR NEGATIVE NEGATIVE Final    Comment: (NOTE) SARS-CoV-2 target nucleic acids are NOT DETECTED.  The SARS-CoV-2 RNA is generally detectable in upper respiratory specimens during the acute phase of infection. The lowest concentration of SARS-CoV-2 viral copies this assay can detect is 138 copies/mL. A negative result does not preclude SARS-Cov-2 infection and should not be used as the sole basis for treatment or other patient management decisions. A negative result may occur with  improper specimen collection/handling, submission of specimen other than nasopharyngeal swab, presence of viral mutation(s) within the areas targeted by this assay, and inadequate number of viral copies(<138 copies/mL). A negative result must be combined with clinical observations, patient history, and epidemiological information. The expected result is Negative.  Fact Sheet for Patients:  BloggerCourse.com  Fact Sheet for Healthcare Providers:  SeriousBroker.it  This test is no t yet  approved or cleared by the [place] FDA and  has been authorized for detection and/or diagnosis of SARS-CoV-2 by FDA under an Emergency Use Authorization (EUA). This EUA will remain  in effect (meaning this test can be used) for the duration of the COVID-19 declaration under Section 564(b)(1) of the Act, 21 U.S.C.section 360bbb-3(b)(1), unless the authorization is terminated  or revoked sooner.       Influenza A by PCR NEGATIVE NEGATIVE Final   Influenza B by PCR NEGATIVE NEGATIVE Final    Comment: (NOTE) The Xpert Xpress SARS-CoV-2/FLU/RSV plus assay is intended as an aid in the diagnosis of influenza from Nasopharyngeal swab specimens and should not be used as a sole basis for treatment. Nasal washings and aspirates are unacceptable for Xpert Xpress SARS-CoV-2/FLU/RSV testing.  Fact Sheet for Patients: BloggerCourse.com  Fact Sheet for Healthcare Providers: SeriousBroker.it  This test is not yet approved or cleared by the [place] FDA and has been authorized for detection and/or diagnosis of SARS-CoV-2 by FDA under an Emergency Use Authorization (EUA). This EUA will remain in effect (meaning this test can be used) for the duration of the COVID-19 declaration under Section 564(b)(1) of the Act, 21 U.S.C. section 360bbb-3(b)(1), unless the authorization is terminated or revoked.  Performed at [facility], [address] [place], [place] [address]   Culture, blood (single)     Status: None   Collection Time: 10/19/20  2:38 PM   Specimen: BLOOD  Result Value Ref Range Status   Specimen Description BLOOD SITE NOT SPECIFIED  Final   Special Requests   Final    BOTTLES DRAWN AEROBIC ONLY Blood Culture results may not be optimal due to an inadequate volume  of blood received in culture bottles   Culture   Final    NO GROWTH 5 DAYS Performed at [facility], [address], [place], [place] [address]     Report Status 10/24/2020 FINAL  Final          Radiology Studies: DG CHEST PORT 1 VIEW  Result Date: 10/23/2020 CLINICAL DATA:  Hypoxemia EXAM: PORTABLE CHEST 1 VIEW COMPARISON:  10/19/2020 FINDINGS: Single frontal view of the chest demonstrates a stable cardiac silhouette. Enteric catheter passes below diaphragm tip excluded by collimation. There is progressive bibasilar airspace disease. No effusion or pneumothorax. 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 IMPRESSION: 1. Progressive bibasilar airspace disease consistent with multifocal pneumonia. Electronically Signed   By: Sharlet Salina M.D.   On: 10/23/2020 17:22        Scheduled Meds:  ALPRAZolam  0.5 mg Per Tube q1600   ALPRAZolam  1 mg Per Tube BID   buPROPion  100 mg Per Tube TID   Chlorhexidine Gluconate Cloth  6 each Topical Daily   famotidine  20 mg Per Tube BID   feeding supplement (PROSource TF)  45 mL Per Tube BID   FLUoxetine  40 mg Per Tube Daily   free water  300 mL Per Tube Q4H   insulin aspart  0-15 Units Subcutaneous Q4H   insulin aspart  7 Units Subcutaneous Q4H   insulin glargine-yfgn  25 Units Subcutaneous Daily   losartan  50 mg Per Tube Daily   metoprolol tartrate  12.5 mg Per Tube Q6H   multivitamin with minerals  1 tablet Per Tube Daily   [START ON 10/25/2020] polyethylene glycol  17 g Per Tube Daily   rosuvastatin  20 mg Per Tube QHS   senna-docusate  1 tablet Per Tube BID   valproic acid  500 mg Per Tube TID   Continuous Infusions:  ampicillin-sulbactam (UNASYN) IV 3 g (10/24/20 0806)   feeding supplement (JEVITY 1.5 CAL/FIBER) 1,000 mL (10/23/20 1830)     LOS: 5 days    Time spent: 35 minutes.     Alba Cory, MD [facility]   If 7PM-7AM, please contact night-coverage [email or web address]  10/24/2020, 10:11 AM

## 2020-10-24 NOTE — Progress Notes (Signed)
SLP Cancellation Note  Patient Details Name: Edward Garner MRN: [id number] DOB: May 04, 1965   Cancelled treatment:       Reason Eval/Treat Not Completed: Medical issues which prohibited therapy. Discussed with care team. Pts respiratory function still tenuous. Will await improvement prior to further swallowing interventions. RN will continue oral care but hold ice chips.    Shawntay Prest, [name] 10/24/2020, 10:38 AM

## 2020-10-24 NOTE — Progress Notes (Signed)
Inpatient Diabetes Program Recommendations  AACE/ADA: New Consensus Statement on Inpatient Glycemic Control  Target Ranges:  Prepandial:   less than 140 mg/dL      Peak postprandial:   less than 180 mg/dL (1-2 hours)      Critically ill patients:  140 - 180 mg/dL   Results for ATWOOD, ADCOCK (MRN [id number]) as of 10/24/2020 08:26  Ref. Range 10/23/2020 07:52 10/23/2020 11:41 10/23/2020 15:43 10/23/2020 19:34 10/23/2020 23:08 10/24/2020 03:05 10/24/2020 07:42  Glucose-Capillary Latest Ref Range: 70 - 99 mg/dL 297 (H) 989 (H) 211 (H) 281 (H) 331 (H) 296 (H) 450 (H)    Review of Glycemic Control  Diabetes history: DM2 Outpatient Diabetes medications: Jardiance 10 mg daily, Metformin 2000 mg daily, Glipizide XL 10 mg daily, Trulicity 3 mg Qweek Current orders for Inpatient glycemic control: Semglee 15 units daily, Novolog 4 units Q4H, Novolog 0-15 units Q4H; Jevity @65  ml/hr  Inpatient Diabetes Program Recommendations:    Insulin: Noted patient received Decadron 4 mg 4 times within the past 24 hours (last dose at 5:33 today) and glucose up to 450 mg/dl this morning. Please consider increasing Semglee to 25 units daily and increasing tube feeding coverage to Novolog 7 units Q4H.  Thanks, , RN, MSN, CDE Diabetes Coordinator Inpatient Diabetes Program [phone] (Team Pager from 8am to 5pm)

## 2020-10-24 NOTE — Progress Notes (Signed)
Nutrition Follow-up  DOCUMENTATION CODES:   Non-severe (moderate) malnutrition in context of chronic illness  INTERVENTION:   Continue TF via Cortrak tube: Jevity 1.5 at 65 ml/h (1560 ml per day) Prosource TF 45 ml BID Free water flushes 200 ml every 4 hours  Provides 2420 kcal, 122 gm protein, 2386 ml free water total daily.  NUTRITION DIAGNOSIS:   Moderate Malnutrition related to chronic illness as evidenced by moderate fat depletion, moderate muscle depletion.  Ongoing   GOAL:   Patient will meet greater than or equal to 90% of their needs  Met with TF  MONITOR:   TF tolerance, Weight trends, Skin  REASON FOR ASSESSMENT:   Consult Enteral/tube feeding initiation and management  ASSESSMENT:   55 y.o. male presented to the ED with lethargy and O2 saturations in the 70's. Pt received a cervical ACDF C3-C6 on 10/3 and d/c on 10/4, pt returned to the ED after passing out and was d/c that day. Pt then had a televisit with PCP for pneumonia and was placed on an antibiotic. PMH of T2DM and HTN. Pt admitted with hypoxia respiratory failure due to pneumonia.  Discussed patient in ICU rounds and with RN today. Patient with tenuous respiratory status. Currently NPO, not allowed ice chips. Glucoses are elevated; insulin doses are being adjusted.  Cortrak tube was placed on 10/7, tip is gastric. Currently receiving Jevity 1.5 at 65 ml/h with Prosource TF 45 ml BID and free water flushes 200 ml every 4 hours. This provides 2420 kcal, 122 gm protein, 1186 ml free water daily (2386 ml total with flushes).  Admission weight 73.3 kg Current weight 71.8 kg  Labs reviewed.  CBG: [phone]  Medications reviewed and include Pepcid, Novolog SSI and 7 units every 4 hours (started today), Semglee, MVI with minerals, Miralax, Senokot-S.  Diet Order:   Diet Order             Diet NPO time specified  Diet effective now                   EDUCATION NEEDS:   No education  needs have been identified at this time  Skin:  Skin Assessment: Skin Integrity Issues: Skin Integrity Issues:: Incisions Incisions: Neck  Last BM:  10/10 type 2  Height:   Ht Readings from Last 1 Encounters:  10/19/20 6' (1.829 m)    Weight:   Wt Readings from Last 1 Encounters:  10/24/20 71.8 kg    Ideal Body Weight:  80.9 kg  BMI:  Body mass index is 21.47 kg/m.  Estimated Nutritional Needs:   Kcal:  2250-2450  Protein:  115-130 grams  Fluid:  >/= 2.3 L    Lucas Mallow, RD, LDN, CNSC Please refer to Amion for contact information.
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
## 2020-10-24 NOTE — Progress Notes (Signed)
Subjective: Patient reports doing ok, mild neck pain. Still not swallowing well.  Objective: Vital signs in last 24 hours: Temp:  [97.9 F (36.6 C)-98.5 F (36.9 C)] 98.2 F (36.8 C) (10/11 0744) Pulse Rate:  [74-102] 82 (10/11 0600) Resp:  [13-31] 14 (10/11 0600) BP: (90-161)/(63-99) 139/81 (10/11 0600) SpO2:  [82 %-99 %] 95 % (10/11 0600) Weight:  [71.8 kg] 71.8 kg (10/11 0458)  Intake/Output from previous day: 10/10 0701 - 10/11 0700 In: 4835.9 [I.V.:1215.1; NG/GT:3220; IV Piggyback:400.8] Out: 4150 [Urine:4150] Intake/Output this shift: No intake/output data recorded.  Neurologic: Grossly normal  Lab Results: Lab Results  Component Value Date   WBC 10.5 10/24/2020   HGB 11.3 (L) 10/24/2020   HCT 34.1 (L) 10/24/2020   MCV 96.6 10/24/2020   PLT 297 10/24/2020   Lab Results  Component Value Date   INR 1.0 10/16/2020   BMET Lab Results  Component Value Date   NA 143 10/24/2020   K 4.5 10/24/2020   CL 96 (L) 10/24/2020   CO2 36 (H) 10/24/2020   GLUCOSE 379 (H) 10/24/2020   BUN 24 (H) 10/24/2020   CREATININE 0.80 10/24/2020   CALCIUM 9.8 10/24/2020    Studies/Results: DG CHEST PORT 1 VIEW  Result Date: 10/23/2020 CLINICAL DATA:  Hypoxemia EXAM: PORTABLE CHEST 1 VIEW COMPARISON:  10/19/2020 FINDINGS: Single frontal view of the chest demonstrates a stable cardiac silhouette. Enteric catheter passes below diaphragm tip excluded by collimation. There is progressive bibasilar airspace disease. No effusion or pneumothorax. 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 IMPRESSION: 1. Progressive bibasilar airspace disease consistent with multifocal pneumonia. Electronically Signed   By: Sharlet Salina M.D.   On: 10/23/2020 17:22    Assessment/Plan: S/p acdf with readmission due to dysphagia. Continues to be on 10L of O2, was on bipap yesterday but got lasix and improved. Really encouraged ambulating with therapy today.    LOS: 5 days     Tiana Loft [facility] 10/24/2020, 7:53 AM

## 2020-10-24 NOTE — Progress Notes (Signed)
NAME:  Edward Garner, MRN:  [id number], DOB:  [birth year], LOS: 5 ADMISSION DATE:  10/19/2020, CONSULTATION DATE:  10/19/20 REFERRING MD:  TRH, CHIEF COMPLAINT:  Hypoxic resp failure   History of Present Illness:  55 year old male with prior history of former smoker, OSA, HTN, HLD, DMT2, seizures, and cervical spondylosis/ stenosis s/p ACDF on 10/3 presenting from home with confusion.   Patient recently underwent cervical ACDF C3-C6 with Dr. [name] on 10/3 and discharged home on 10/4.  Patient presented to ER later that day after syncopal episode at home with negative CTH and cervical CT and required scalp laceration repair.  No other complaints, normal ddimer and hemodynamics and therefore discharged home.  He possibly had a virtual visit with his PCP yesterday with some concern for pneumonia and was started on an unknown antibiotic, only taking one dose.  Returns to ER again today for progressive lethargy at home found by EMS with O2 saturations in the 70's, placed on NRB and no improvement to narcan.  Additionally reported decrease oral intake due to ongoing pain and some difficulty swallowing, only taking 2.5 tablets of his hydrocodone today.  In ER, he was afebrile, normotensive, HR 100, rr 24 and on NRB.  Workup notable for BNP 99, normal WBC but mild left shift on differential, Hgb 11.4, neg trop hs x 2, and VBG/ mixed showing 7.3/ 43/ 64/ 22.  UA neg, SARs/ flu neg.  CT cervical showed slightly increased prevertebral swelling compared to 10/4 and narrowed AP diameter of the larynx the level of the epiglottis to 5 mm, CTH negative for acute intracranial process, and CTA PE was negative for PE, but showed multifocal pneumonia and thickening of esophageal wall possibly reflecting esophagitis, and hepatomegaly.   He remains lethargic but given ongoing hypoxia and increasing tachypnea, PCCM consulted for concern of respiratory failure.   Pertinent  Medical History  Former smoker, OSA, HTN, DMT2,  seizures, cervical spondylosis/ stenosis  Significant Hospital Events: Including procedures, antibiotic start and stop dates in addition to other pertinent events   10/3 cervical ACDF C3-C6 with Dr. [name], discharged home 10/4 10/4 syncope at home, ER eval neg, discharged home 10/6 Admit to PCCM for acute hypoxic respiratory failure 2/2 multifocal pna, on BiPAP 10/7 On 11L [place] 10/10 re-consult on 10L, CXR with volume loss/atelectasis on L, infiltrate persists on right base  Interim History / Subjective:  O2 needs improving slowly. Down to 9L HFNC. Awake. Comfortable.   Objective   Blood pressure 136/79, pulse 80, temperature 98.2 F (36.8 C), temperature source Oral, resp. rate 20, height 6' (1.829 m), weight 71.8 kg, SpO2 95 %.        Intake/Output Summary (Last 24 hours) at 10/24/2020 0932 Last data filed at 10/24/2020 0800 Gross per 24 hour  Intake 4557.37 ml  Output 4150 ml  Net 407.37 ml   Filed Weights   10/22/20 0500 10/23/20 0340 10/24/20 0458  Weight: 72 kg 71.6 kg 71.8 kg   Physical Exam: General: Chronically ill-appearing, no acute distress HENT: [place], AT, OP clear, MMM Eyes: EOMI, no scleral icterus Respiratory:  resps even non labored on HFNC, diminished  Cardiovascular: RRR, -M/R/G, no JVD GI: BS+, soft, nontender Extremities:-Edema,-tenderness Neuro: awake, alert  CXR 10/11 pending (Rad tech in room taking images now)  Resolved Hospital Problem list    Assessment & Plan:   Acute hypoxemic respiratory failure in the setting of  concern for aspiration, hypoventilation with shunt physiology via atelectasis  H/O OSA  on CPAP at HS  Plan -Continue to wean oxygen for goal SpO2 >90% -Unasyn to complete 7 days -Trial diuresis lasix 20 mg IV x 1 given 10/10 -minimize opiates, xanax given hypoventilation on exam -pulmonary hygiene, mobilize as able  -speech following - has NG tube - would recommend NPO until objective swallow eval   Best Practice (right click  and "Reselect all SmartList Selections" daily)   Per primary  Labs   Reviewed CBC and BMET. Mild hypernatremia  Critical care time:       Dirk Dress, NP Pulmonary/Critical Care Medicine  10/24/2020  9:32 AM

## 2020-10-25 DIAGNOSIS — Z79899 Other long term (current) drug therapy: Secondary | ICD-10-CM | POA: Diagnosis not present

## 2020-10-25 DIAGNOSIS — F05 Delirium due to known physiological condition: Secondary | ICD-10-CM | POA: Diagnosis not present

## 2020-10-25 DIAGNOSIS — J9601 Acute respiratory failure with hypoxia: Secondary | ICD-10-CM | POA: Diagnosis not present

## 2020-10-25 LAB — GLUCOSE, CAPILLARY
Glucose-Capillary: 241 mg/dL — ABNORMAL HIGH (ref 70–99)
Glucose-Capillary: 207 mg/dL — ABNORMAL HIGH (ref 70–99)
Glucose-Capillary: 287 mg/dL — ABNORMAL HIGH (ref 70–99)
Glucose-Capillary: 234 mg/dL — ABNORMAL HIGH (ref 70–99)
Glucose-Capillary: 228 mg/dL — ABNORMAL HIGH (ref 70–99)
Glucose-Capillary: 170 mg/dL — ABNORMAL HIGH (ref 70–99)

## 2020-10-25 MED ORDER — OLANZAPINE 2.5 MG PO TABS
2.5000 mg | ORAL_TABLET | Freq: Two times a day (BID) | ORAL | Status: DC | PRN
  Administered 2020-10-25 – 2020-11-01 (×5): 2.5 mg
  Filled 2020-10-25 (×8): qty 1

## 2020-10-25 MED ORDER — INSULIN GLARGINE-YFGN 100 UNIT/ML ~~LOC~~ SOLN
35.0000 [IU] | Freq: Every day | SUBCUTANEOUS | Status: DC
  Administered 2020-10-25: 35 [IU] via SUBCUTANEOUS
  Filled 2020-10-25 (×2): qty 0.35

## 2020-10-25 MED ORDER — OLANZAPINE 5 MG PO TABS
5.0000 mg | ORAL_TABLET | Freq: Every day | ORAL | Status: DC
  Administered 2020-10-25 – 2020-10-29 (×5): 5 mg
  Filled 2020-10-25 (×7): qty 1

## 2020-10-25 NOTE — Progress Notes (Addendum)
Speech Language Pathology Treatment:    Patient Details Name: Edward Garner MRN: [id number] DOB: 01/20/1965 Today's Date: 10/25/2020 Time: 4193-7902 SLP Time Calculation (min) (ACUTE ONLY): 21 min  Assessment / Plan / Recommendation Clinical Impression  Pt was seen for dysphagia treatment with his mother present. Pt was more alert than when he was last seen by this SLP. He tolerated ice chips and 1/2-3/4tsp boluses of nectar thick liquids without overt s/sx of aspiration. Hyolaryngeal movement was subjectively judged to be reduced with dry swallows, but this improved with boluses. Pt demonstrated delayed coughing with purees and with nectar thick liquids via cup. Secondary swallows were noted with all boluses, suggesting pharyngeal residue. Pt's swallow function appears  to be improving compared to the initial sessions. SLP will continue to follow and conduct MBS pending performance and medical stability.    HPI HPI: Patient recently underwent cervical ACDF C3-C6 with Dr. [name] on 10/3 and discharged home on 10/4.  Patient presented to ER later that day after syncopal episode at home with negative CTH and cervical CT and required scalp laceration repair.  No other complaints, normal ddimer and hemodynamics and therefore discharged home.  He possibly had a virtual visit with his PCP yesterday with some concern for pneumonia and was started on an unknown antibiotic, only taking one dose.  Returns to ER again today for progressive lethargy at home found by EMS with O2 saturations in the 70's, placed on NRB and no improvement to narcan.  Additionally reported decrease oral intake due to ongoing pain and some difficulty swallowing, CT cervical showed slightly increased prevertebral swelling compared to 10/4 and narrowed AP diameter of the larynx the level of the epiglottis to 5 mm, CTH negative for acute intracranial process, and CTA PE was negative for PE, but showed multifocal pneumonia and thickening  of esophageal wall possibly reflecting esophagitis, and hepatomegaly.      SLP Plan  Continue with current plan of care      Recommendations for follow up therapy are one component of a multi-disciplinary discharge planning process, led by the attending physician.  Recommendations may be updated based on patient status, additional functional criteria and insurance authorization.    Recommendations  Diet recommendations:  (ice chips) Medication Administration: Via alternative means                Oral Care Recommendations: Oral care QID;Oral care prior to ice chip/H20 Follow up Recommendations:  (TBD) SLP Visit Diagnosis: Dysphagia, oropharyngeal phase (R13.12) Plan: Continue with current plan of care       Alizabeth Antonio I. Vear Clock, MS, CCC-SLP Acute Rehabilitation Services Office number [phone] Pager [phone]                 Scheryl Marten  10/25/2020, 4:16 PM

## 2020-10-25 NOTE — Progress Notes (Signed)
Subjective: Patient reports doing ok, seems to be confused.   Objective: Vital signs in last 24 hours: Temp:  [97.4 F (36.3 C)-98.1 F (36.7 C)] 98 F (36.7 C) (10/12 1215) Pulse Rate:  [67-102] 88 (10/12 1000) Resp:  [14-23] 18 (10/12 1000) BP: (95-137)/(48-84) 135/82 (10/12 1000) SpO2:  [71 %-100 %] 91 % (10/12 1000) Weight:  [71 kg] 71 kg (10/12 0500)  Intake/Output from previous day: 10/11 0701 - 10/12 0700 In: 3312.7 [I.V.:50.1; NG/GT:2950; IV Piggyback:312.7] Out: 1375 [Urine:1375] Intake/Output this shift: Total I/O In: 100 [IV Piggyback:100] Out: -   Neurologic: Grossly normal  Lab Results: Lab Results  Component Value Date   WBC 10.5 10/24/2020   HGB 11.3 (L) 10/24/2020   HCT 34.1 (L) 10/24/2020   MCV 96.6 10/24/2020   PLT 297 10/24/2020   Lab Results  Component Value Date   INR 1.0 10/16/2020   BMET Lab Results  Component Value Date   NA 143 10/24/2020   K 4.5 10/24/2020   CL 96 (L) 10/24/2020   CO2 36 (H) 10/24/2020   GLUCOSE 379 (H) 10/24/2020   BUN 24 (H) 10/24/2020   CREATININE 0.80 10/24/2020   CALCIUM 9.8 10/24/2020    Studies/Results: DG Cervical Spine 1 View  Result Date: 10/24/2020 CLINICAL DATA:  Status post cervical fusion EXAM: DG CERVICAL SPINE - 1 VIEW COMPARISON:  10/16/2020 intraoperative radiographs, 09/27/2020 cervical radiographs FINDINGS: The cervical spine is imaged from the skull base through the superior aspect of C6. Redemonstrated ACDF C3-C6. No acute fracture. Alignment is unremarkable. No significant prevertebral edema. Gastric tube is noted overlying the pharynx and esophagus. IMPRESSION: Status post ACDF C3-C6.  No acute abnormality. Electronically Signed   By: Wiliam Ke M.D.   On: 10/24/2020 11:48   DG CHEST PORT 1 VIEW  Result Date: 10/23/2020 CLINICAL DATA:  Hypoxemia EXAM: PORTABLE CHEST 1 VIEW COMPARISON:  10/19/2020 FINDINGS: Single frontal view of the chest demonstrates a stable cardiac silhouette. Enteric  catheter passes below diaphragm tip excluded by collimation. There is progressive bibasilar airspace disease. No effusion or pneumothorax. 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 IMPRESSION: 1. Progressive bibasilar airspace disease consistent with multifocal pneumonia. Electronically Signed   By: Sharlet Salina M.D.   On: 10/23/2020 17:22    Assessment/Plan: S/p acdf with dysphagia. Still on O2 for pneumonia, CCM managing.  Continue to mobilize. Ok to dc staples in head from fall.    LOS: 6 days    Edward Garner 10/25/2020, 12:30 PM

## 2020-10-25 NOTE — Consult Note (Signed)
[facility] New Psychiatric Evaluation   Service Date: October 25, 2020 LOS:  LOS: 6 days    Assessment  [facility] is a 55 y.o. male admitted medically for 10/19/2020 12:55 PM for aspiration pneumonia after a spinal procedure. He carries the psychiatric diagnoses of schizoaffective disorder vs bipolar disorder (per mom), ADHD and anxiety and has a past medical history of  adrenal disease, bipolar disorder, diabetes, hyperlipidemia, sleep apnea and spinal stenosis. Psychiatry was consulted for suicidal statments by Edward Barefoot, MD.   His current presentation of waxing and waning levels of attention and orientation is most consistent with delirium. He does carry the diagnosis of bipolar disorder vs schizoaffective disorder , and has a history of pill seeking behavior. Current outpatient psychotropic medications include alprazolam, adderall, buproprion, depakote, prozac, prazosin, vyvanse, zolpidem and historically he has had a poor response to these medications; family has significant concern for overall decompensation over past 2-3 years and overmedication/pill-seeking behavior.   He was compliant with medications prior to admission. On initial examination, patient was unable to engage in a nuanced discussion of medications due to his delirium. Overall we are working towards minimizing deliriogenic meds. Please see plan below for detailed recommendations.   10/11: pt became hyperfocused on short acting xanax not covering anxiety (gets 3 mg ER in AM at home). Not amenable to discussion that he is getting IR TID, eventually agreeable to transition to ativan at roughly the BZD equivalent dose. Balancing respiratory suppression with anxiety difficult in this pt on chronic BZD, stimulant, Z drug and opioids.   10/12: pt seems more delirious, many nonsensical responses to questions. Called steroids "suicide pills" and mom confirmed these have caused psychiatric destabilization  in past. Pt assented and mom consented to olanzapine while on steroids. Specifically discussed risk of worsening tardive dyskinesia; feel olanzapine better agent than depakote in setting of significant malnutrition and hypolabuminemia. Did not discuss BZD today - no intention to further taper until later this week, discussion significantly agitated pt yesterday, pt not currently able to have full r/b/se discussion.   Diagnoses:  Active Hospital problems: Active Problems:   Hypoxemia   Acute respiratory failure with hypoxia (HCC)   Malnutrition of moderate degree   Delirium due to another medical condition   Polypharmacy   Dysphagia    Problems edited/added by me: No problems updated.  Plan  ## Safety and Observation Level:  - Based on my clinical evaluation, I estimate the patient to be at low risk of self harm in the current setting - no sitter needed currently, please order a sitter if he downgrades to a different unit (nurses with LOS to pt, current risk is largely d/t pulling at lines)  ## Medications:  --Standing: CONTINUE:   - buproprion 100 mg TID  - valproic acid 500 mg TID  - fluoxetine 40 mg qD - lorazepam 2 mg/1 mg/2 mg (home dose alprazolam 3 mg/d)   START olanzapine 5 QHS and 2.5 BID PRN agitation.   Continue to hold:   - zolpidem, home stimulants  Consider scheduling some of his opioid regimen rather than having  it be PRN. In the setting of delirium, patients often do not consistently and accurately interpret pain and/or request pain medications. While opioids may contribute to delirium -- inadequate pain control and getting behind on pain is also likely to contribute to delirium and worsen presentation. Please consider starting scheduled pain regimen as respiratory status improves that is appropriate based on this patients  particular cause(s) of pain and prior exposure to opioids and non-opioids. Adjust scheduled pain regimen as indicated by PRN use and clinical  presentation. This must be balanced against decreased respiratory drive.   We are generally making medication changes to reduce sedative burden in this patient (reducing xanax, stopping ambien).   ## Medical Decision Making Capacity:  Not specifically assessed  ## Further Work-up:  -- to be seen by neuro  ## Disposition:  --  no psychiatric contraindications to discharge at this time  ## Behavioral / Environmental:  -- ordering delirium precautions   Thank you for this consult request. Recommendations have been communicated to the primary team.  We will continue to follow at this time.   [facility] A [facility]    NEW   Relevant Aspects of Hospital Course:  Admitted on 10/19/2020 for AMS and was found to have aspiration pneumonia.  Patient Report:  Patient seen in late AM. Initially stated "I'm doing the same which is good" (yesterday was a bad day).   Oriented to self only (thought he was in a doctor's office). Unable to state year (30) or month (2020). Thought he was in the doctor's office because something went wrong and he made a little oops which was a hydrogen bomb (many similarly nonsensical statements made throughout interview). Is able to convey that he has had mood instability after getting steroids in the past ("suicide pills"). No SI now but clearly poor frustration tolerance. Discussed olanzapine - he asked if this was a neuroleptic but was agreeable when we discussed it would be short term only (seemed more attentive during this portion of interview)  I commented that his anxiety seemed better which he agreed with.    ROS:  Unable to complete due to level of delirium. Complained of  thirst.   Collateral information:  Mom says he had generally been less agitated but more confused.   Psychiatric History:  Information collected from pt, mother  Medical History: Past Medical History:  Diagnosis Date   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HCC)    Bipolar disorder (HCC)     Complication of anesthesia    Diabetes mellitus without complication (HCC)    Hyperlipidemia    Hypertension    Kidney disease    PONV (postoperative nausea and vomiting)    Sleep apnea    Sleep apnea, unspecified 10/19/2018   Spinal stenosis     Surgical History: Past Surgical History:  Procedure Laterality Date   ANTERIOR CERVICAL DECOMP/DISCECTOMY FUSION N/A 10/16/2020   Procedure: ACDF - C3-C4 - C4-C5 - C5-C6;  Surgeon: Tia Alert, MD;  Location: [facility] OR;  Service: Neurosurgery;  Laterality: N/A;   TONSILLECTOMY      Medications:   Current Facility-Administered Medications:    Ampicillin-Sulbactam (UNASYN) 3 g in sodium chloride 0.9 % 100 mL IVPB, 3 g, Intravenous, Q6H, [name], MD, Last Rate: 200 mL/hr at 10/25/20 0829, 3 g at 10/25/20 0829   buPROPion [facility]) tablet 100 mg, 100 mg, Per Tube, TID, [name], MD, 100 mg at 10/25/20 0949   Chlorhexidine Gluconate Cloth 2 % PADS 6 each, 6 each, Topical, Daily, Mannam, Praveen, MD, 6 each at 10/24/20 2241   docusate (COLACE) 50 MG/5ML liquid 100 mg, 100 mg, Per Tube, BID PRN, [name], MD   famotidine (PEPCID) tablet 20 mg, 20 mg, Per Tube, BID, [name], MD, 20 mg at 10/25/20 0950   feeding supplement (JEVITY 1.5 CAL/FIBER) liquid 1,000 mL, 1,000 mL, Per Tube,  Continuous, [name], MD, Last Rate: 65 mL/hr at 10/24/20 1211, 1,000 mL at 10/24/20 1211   feeding supplement (PROSource TF) liquid 45 mL, 45 mL, Per Tube, BID, [name], MD, 45 mL at 10/25/20 0950   FLUoxetine (PROZAC) 20 MG/5ML solution 40 mg, 40 mg, Per Tube, Daily, [name], MD, 40 mg at 10/25/20 0950   free water 200 mL, 200 mL, Per Tube, Q4H, Regalado, Belkys A, MD, 200 mL at 10/25/20 1201   hydrALAZINE (APRESOLINE) injection 10 mg, 10 mg, Intravenous, Q4H PRN, Tobey Grim, NP   HYDROmorphone (DILAUDID) injection 0.5 mg, 0.5 mg, Intravenous, Q4H PRN, Regalado, Belkys A, MD, 0.5 mg at 10/25/20  1201   insulin aspart (novoLOG) injection 0-15 Units, 0-15 Units, Subcutaneous, Q4H, Tobey Grim, NP, 8 Units at 10/25/20 1222   insulin aspart (novoLOG) injection 7 Units, 7 Units, Subcutaneous, Q4H, Whiteheart, Kathryn A, NP, 7 Units at 10/25/20 1222   insulin glargine-yfgn (SEMGLEE) injection 35 Units, 35 Units, Subcutaneous, Daily, Pahwani, Daleen Bo, MD, 35 Units at 10/25/20 0949   ipratropium-albuterol (DUONEB) 0.5-2.5 (3) MG/3ML nebulizer solution 3 mL, 3 mL, Nebulization, Q6H PRN, Mikey College T, MD   labetalol (NORMODYNE) injection 10 mg, 10 mg, Intravenous, Q2H PRN, Janyth Contes, Ozzie Hoyle, NP   lip balm (CARMEX) ointment, , Topical, PRN, Rica Mote, MD   LORazepam (ATIVAN) injection 1 mg, 1 mg, Intravenous, q1600, Ambry Dix A, 1 mg at 10/24/20 1638   LORazepam (ATIVAN) injection 2 mg, 2 mg, Intravenous, BID, Christyana Corwin A, 2 mg at 10/25/20 0948   losartan (COZAAR) tablet 50 mg, 50 mg, Per Tube, Daily, [name], MD, 50 mg at 10/25/20 0949   metoprolol tartrate (LOPRESSOR) tablet 12.5 mg, 12.5 mg, Per Tube, Q6H, Regalado, Belkys A, MD, 12.5 mg at 10/25/20 1200   multivitamin with minerals tablet 1 tablet, 1 tablet, Per Tube, Daily, [name], MD, 1 tablet at 10/25/20 0950   OLANZapine (ZYPREXA) tablet 2.5 mg, 2.5 mg, Oral, BID PRN, Diante Barley A   OLANZapine (ZYPREXA) tablet 5 mg, 5 mg, Oral, QHS, Shayna Eblen A   ondansetron (ZOFRAN) injection 4 mg, 4 mg, Intravenous, Q6H PRN, Mikey College T, MD   oxyCODONE-acetaminophen (PERCOCET/ROXICET) 5-325 MG per tablet 1-2 tablet, 1-2 tablet, Per Tube, Q6H PRN, Regalado, Belkys A, MD, 2 tablet at 10/25/20 1201   polyethylene glycol (MIRALAX / GLYCOLAX) packet 17 g, 17 g, Per Tube, Daily PRN, [name], MD, 17 g at 10/25/20 0950   polyethylene glycol (MIRALAX / GLYCOLAX) packet 17 g, 17 g, Per Tube, Daily, Regalado, Belkys A, MD   rosuvastatin (CRESTOR) tablet 20 mg, 20 mg, Per Tube,  QHS, [name], MD, 20 mg at 10/24/20 2240   senna-docusate (Senokot-S) tablet 1 tablet, 1 tablet, Per Tube, BID, Regalado, Belkys A, MD, 1 tablet at 10/25/20 0950   valproic acid (DEPAKENE) 250 MG/5ML solution 500 mg, 500 mg, Per Tube, TID, [name], MD, 500 mg at 10/25/20 0950  Allergies: Allergies  Allergen Reactions   Gramineae Pollens     Social History:  See HPI  Family History:  The patient's family history includes Bipolar disorder in his father; Cancer in his father; Diabetes in his father; Hyperlipidemia in his father; Hypertension in his father; Non-Hodgkin's lymphoma in his mother; Thyroid disease in his father.    Objective  Vital signs:  Temp:  [97.4 F (36.3 C)-98.1 F (36.7 C)] 98 F (36.7 C) (10/12 1215) Pulse Rate:  [  67-102] 88 (10/12 1000) Resp:  [14-23] 18 (10/12 1000) BP: (95-137)/(48-84) 135/82 (10/12 1000) SpO2:  [71 %-100 %] 91 % (10/12 1000) Weight:  [71 kg] 71 kg (10/12 0500)  Physical Exam: Gen: Normocephalic/atraumatic Pulm: on [place], occasionally pulls at it, responsive to redirection Neuro: CNII-XII grossly intact Psych: disoriented, worse than yesterday   Mental Status Exam: Appearance: Ill appearing and pale  Attitude:  Less demanding, confused   Behavior/Psychomotor: Psychomotor restlessness, pulling at [place]. Abnormal orofacial movements noted again today   Speech/Language:  Increased rate, volume  Mood: Frustrated  Affect: Congruent   Thought process: Largely tangential, occasionally responds appropriately   Thought content:   Devoid of SI/HI  Perceptual disturbances:  Not endorsed, not overtly RIS  Attention: fluctuating  Concentration: poor  Orientation: Self only   Memory: poor  Fund of knowledge:  Did not formally assess  Insight:   poor  Judgment:  poor  Impulse Control: poor

## 2020-10-25 NOTE — Progress Notes (Signed)
PROGRESS NOTE    Edward Garner  HCW:[id number] DOB: 16-Dec-1965 DOA: 10/19/2020 PCP: [name], MD   Brief Narrative: 55 year old with past medical history significant for smoker, OSA, hypertension, hyperlipidemia, diabetes type 2, seizure and cervical spondylosis/stenosis status post ACDF on 10/3 presenting from home with confusion.  Patient recently underwent cervical ACDF C3 C6 with Dr. [name] on 10/3 and discharged home on 10/4.  Patient presented to the ER later that day after a syncopal episode at home with negative CT head and cervical CT, requiring scalp laceration repair.  Subsequently was discharged home.  Patient returned to the ER the day of admission due to progressive lethargy and found by EMS with oxygen saturation in the 70s, he was placed on normal breather mask and received Narcan with significant improvement.  Poor oral intake due to ongoing pain and some difficulty with swallowing. Patient was admitted with multifocal pneumonia.  CTA was negative for PE.  CT cervical which showed slightly increased prevertebral swelling compared to 10/4, P diameter of the larynx the level of the epiglottis to 5 mm.  Significant Hospital events: 10/3 cervical ACDF C3-C6 with Dr. [name], discharged home 10/4 10/4 syncope at home, ER eval neg, discharged home 10/6 Admit to PCCM for acute hypoxic respiratory failure 2/2 multifocal pna, on BiPAP 10/7 On 11L [place]  Assessment & Plan:   Active Problems:   Hypoxemia   Acute respiratory failure with hypoxia (HCC)   Malnutrition of moderate degree   Delirium due to another medical condition   Polypharmacy   Dysphagia  1-Acute Hypoxic respiratory failure in the setting of multifocal pneumonia: Concern for aspiration event with prevertebral swelling status post C-spine fusion -History of OSA on CPAP Continue with core track and n.p.o. with speech following Continue with Unasyn day 6/7 treatment for PNA.  Continue with oxygen supplementation  and try to wean as able to.  Currently still requiring 8 L oxygen and saturating around 91%.CM re-consulted. Worsening hypoxemia thought to be related to hypoventilation/atelectasis.  Continue and encouraged incentive spirometry.  Continue pulmonary hygiene.  2-Dysphagia: CT with slightly increased prevertebral swelling compared to 10/4 Continue with core track and tube feeding Continue NPO. Speech following.  Received IV steroids.   Hypernatremia:; Resolved.  Diabetes type 2: Significantly hyperglycemic.  On Semglee 25 units.  Will increase that to 35 units and continue NovoLog 7 units every 4 hours.  Continue SSI as well.  Seizure disorder: Continue with Depakote.   Bipolar: Continue with Prozac and Wellbutrin. Holding Lyrica He reporter  to nurse last night he wants to jump out of window.. -psych recommend continue with bupropion valporic acid and Prozac. -hold adderal and Vyvasne.  -Alprazolam change to ativan.  -Psych recommend schedule pain medication, his pain is very well controlled.  With as needed medications.  Patient started on Zyprexa for steroid-induced agitation on 10/25/2020 and psychiatry does not recommend continuing that at the time of discharge. -If patient is transfer to progressive bed, he will need sitter per psych recommendation. Order for sitter in placed.   Hypomagnesemia: replaced.   Hypertension: Treated with Cozaar and metoprolol  Recent C-spine fusion by Dr. [name] 10/4: Neurosurgery following   Nutrition Problem: Moderate Malnutrition Etiology: chronic illness    Signs/Symptoms: moderate fat depletion, moderate muscle depletion    Interventions: MVI, Tube feeding  Estimated body mass index is 21.23 kg/m as calculated from the following:   Height as of this encounter: 6' (1.829 m).   Weight as of this encounter: 71 kg.  DVT prophylaxis: SCD Code Status: Full code Family Communication: Mother who was at bedside Disposition Plan:   Status is: Inpatient  Remains inpatient appropriate because:IV treatments appropriate due to intensity of illness or inability to take PO  Dispo: The patient is from: Home              Anticipated d/c is to: Home              Patient currently is not medically stable to d/c.   Difficult to place patient No   Consultants:  Neurosurgery   Procedures:    Antimicrobials:    Subjective: Patient seen and examined.  Mother was at the bedside and sitter was at the bedside as well.  Patient alert.  Partially oriented.  Did not make sense in his conversations.  Looks comfortable.  Objective: Vitals:   10/25/20 0849 10/25/20 0900 10/25/20 1000 10/25/20 1215  BP:  99/81 135/82   Pulse:  80 88   Resp:  15 18   Temp:    98 F (36.7 C)  TempSrc:    Oral  SpO2: 93% 94% 91%   Weight:      Height:        Intake/Output Summary (Last 24 hours) at 10/25/2020 1421 Last data filed at 10/25/2020 0800 Gross per 24 hour  Intake 2395.07 ml  Output 375 ml  Net 2020.07 ml    Filed Weights   10/23/20 0340 10/24/20 0458 10/25/20 0500  Weight: 71.6 kg 71.8 kg 71 kg    Examination:  General exam: Appears calm and comfortable  Respiratory system: Catered rhonchi. Respiratory effort normal. Cardiovascular system: S1 & S2 heard, RRR. No JVD, murmurs, rubs, gallops or clicks. No pedal edema. Gastrointestinal system: Abdomen is nondistended, soft and nontender. No organomegaly or masses felt. Normal bowel sounds heard. Central nervous system: Alert and oriented x1. No focal neurological deficits.  Data Reviewed: I have personally reviewed following labs and imaging studies  CBC: Recent Labs  Lab 10/19/20 1421 10/19/20 1438 10/20/20 0306 10/21/20 0453 10/22/20 0759 10/23/20 0056 10/24/20 0618  WBC 6.6  --  8.4 11.4* 13.8* 11.1* 10.5  NEUTROABS 4.1  --  4.9  --   --   --   --   HGB 11.4*   < > 11.3* 12.5* 12.2* 11.7* 11.3*  HCT 34.7*   < > 33.9* 36.8* 37.0* 36.0* 34.1*  MCV 99.7   --  98.3 96.1 98.4 100.3* 96.6  PLT 183  --  208 292 319 294 297   < > = values in this interval not displayed.    Basic Metabolic Panel: Recent Labs  Lab 10/20/20 0306 10/20/20 1428 10/20/20 1655 10/21/20 0453 10/21/20 1620 10/22/20 0759 10/22/20 2023 10/23/20 0056 10/23/20 0813 10/23/20 1833 10/24/20 0618  NA 144  --   --  148*  --  159* 156* 155* 149* 145 143  K 3.9  --   --  3.7  --  3.6 3.6 3.5 3.7 4.5 4.5  CL 112*  --   --  114*  --  120* 118* 116* 108 106 96*  CO2 19*  --   --  23  --  32 31 32 32 32 36*  GLUCOSE 104*  --   --  158*  --  252* 205* 190* 302* 243* 379*  BUN 18  --   --  27*  --  24* 19 15 14 15  24*  CREATININE 1.10  --   --  0.90  --  0.78 0.76 0.66 0.77 0.71 0.80  CALCIUM 9.5  --   --  10.0  --  10.0 9.8 9.7 9.5 9.5 9.8  MG 1.7 1.9 2.1 2.2 2.2 2.2  --   --   --   --   --   PHOS 3.7 3.7 3.6 3.2 2.5  --   --   --   --   --   --     GFR: Estimated Creatinine Clearance: 107.2 mL/min (by C-G formula based on SCr of 0.8 mg/dL). Liver Function Tests: Recent Labs  Lab 10/19/20 1421 10/24/20 0618  AST 19 58*  ALT 14 127*  ALKPHOS 46 96  BILITOT 1.0 0.4  PROT 5.8* 5.8*  ALBUMIN 2.7* 2.0*    No results for input(s): LIPASE, AMYLASE in the last 168 hours. Recent Labs  Lab 10/24/20 1031  AMMONIA 22   Coagulation Profile: No results for input(s): INR, PROTIME in the last 168 hours.  Cardiac Enzymes: No results for input(s): CKTOTAL, CKMB, CKMBINDEX, TROPONINI in the last 168 hours. BNP (last 3 results) No results for input(s): PROBNP in the last 8760 hours. HbA1C: No results for input(s): HGBA1C in the last 72 hours. CBG: Recent Labs  Lab 10/24/20 1929 10/24/20 2336 10/25/20 0325 10/25/20 0821 10/25/20 1213  GLUCAP 211* 264* 241* 207* 287*    Lipid Profile: No results for input(s): CHOL, HDL, LDLCALC, TRIG, CHOLHDL, LDLDIRECT in the last 72 hours. Thyroid Function Tests: No results for input(s): TSH, T4TOTAL, FREET4, T3FREE,  THYROIDAB in the last 72 hours. Anemia Panel: No results for input(s): VITAMINB12, FOLATE, FERRITIN, TIBC, IRON, RETICCTPCT in the last 72 hours. Sepsis Labs: Recent Labs  Lab 10/19/20 2048  LATICACIDVEN 1.3     Recent Results (from the past 240 hour(s))  SARS Coronavirus 2 by RT PCR (hospital order, performed in [facility] hospital lab) Nasopharyngeal Nasopharyngeal Swab     Status: None   Collection Time: 10/16/20 10:42 AM   Specimen: Nasopharyngeal Swab  Result Value Ref Range Status   SARS Coronavirus 2 NEGATIVE NEGATIVE Final    Comment: (NOTE) SARS-CoV-2 target nucleic acids are NOT DETECTED.  The SARS-CoV-2 RNA is generally detectable in upper and lower respiratory specimens during the acute phase of infection. The lowest concentration of SARS-CoV-2 viral copies this assay can detect is 250 copies / mL. A negative result does not preclude SARS-CoV-2 infection and should not be used as the sole basis for treatment or other patient management decisions.  A negative result may occur with improper specimen collection / handling, submission of specimen other than nasopharyngeal swab, presence of viral mutation(s) within the areas targeted by this assay, and inadequate number of viral copies (<250 copies / mL). A negative result must be combined with clinical observations, patient history, and epidemiological information.  Fact Sheet for Patients:   BoilerBrush.com.cy  Fact Sheet for Healthcare Providers: https://pope.com/  This test is not yet approved or  cleared by the [place] FDA and has been authorized for detection and/or diagnosis of SARS-CoV-2 by FDA under an Emergency Use Authorization (EUA).  This EUA will remain in effect (meaning this test can be used) for the duration of the COVID-19 declaration under Section 564(b)(1) of the Act, 21 U.S.C. section 360bbb-3(b)(1), unless the authorization is terminated  or revoked sooner.  Performed at [facility], [address], [place], [place] [address]   Surgical pcr screen     Status: None   Collection Time: 10/16/20 11:23 AM  Specimen: Nasal Mucosa; Nasal Swab  Result Value Ref Range Status   MRSA, PCR NEGATIVE NEGATIVE Final   Staphylococcus aureus NEGATIVE NEGATIVE Final    Comment: (NOTE) The Xpert SA Assay (FDA approved for NASAL specimens in patients 69 years of age and older), is one component of a comprehensive surveillance program. It is not intended to diagnose infection nor to guide or monitor treatment. Performed at [facility], [address], [place], [place] [address]   Urine Culture     Status: None   Collection Time: 10/19/20  1:48 PM   Specimen: Urine, Clean Catch  Result Value Ref Range Status   Specimen Description URINE, CLEAN CATCH  Final   Special Requests NONE  Final   Culture   Final    NO GROWTH Performed at [facility], [address], [place], [place] [address]    Report Status 10/20/2020 FINAL  Final  Resp Panel by RT-PCR (Flu A&B, Covid) Nasopharyngeal Swab     Status: None   Collection Time: 10/19/20  2:31 PM   Specimen: Nasopharyngeal Swab; Nasopharyngeal(NP) swabs in vial transport medium  Result Value Ref Range Status   SARS Coronavirus 2 by RT PCR NEGATIVE NEGATIVE Final    Comment: (NOTE) SARS-CoV-2 target nucleic acids are NOT DETECTED.  The SARS-CoV-2 RNA is generally detectable in upper respiratory specimens during the acute phase of infection. The lowest concentration of SARS-CoV-2 viral copies this assay can detect is 138 copies/mL. A negative result does not preclude SARS-Cov-2 infection and should not be used as the sole basis for treatment or other patient management decisions. A negative result may occur with  improper specimen collection/handling, submission of specimen other than nasopharyngeal swab, presence of viral mutation(s) within the areas targeted by  this assay, and inadequate number of viral copies(<138 copies/mL). A negative result must be combined with clinical observations, patient history, and epidemiological information. The expected result is Negative.  Fact Sheet for Patients:  BloggerCourse.com  Fact Sheet for Healthcare Providers:  SeriousBroker.it  This test is no t yet approved or cleared by the [place] FDA and  has been authorized for detection and/or diagnosis of SARS-CoV-2 by FDA under an Emergency Use Authorization (EUA). This EUA will remain  in effect (meaning this test can be used) for the duration of the COVID-19 declaration under Section 564(b)(1) of the Act, 21 U.S.C.section 360bbb-3(b)(1), unless the authorization is terminated  or revoked sooner.       Influenza A by PCR NEGATIVE NEGATIVE Final   Influenza B by PCR NEGATIVE NEGATIVE Final    Comment: (NOTE) The Xpert Xpress SARS-CoV-2/FLU/RSV plus assay is intended as an aid in the diagnosis of influenza from Nasopharyngeal swab specimens and should not be used as a sole basis for treatment. Nasal washings and aspirates are unacceptable for Xpert Xpress SARS-CoV-2/FLU/RSV testing.  Fact Sheet for Patients: BloggerCourse.com  Fact Sheet for Healthcare Providers: SeriousBroker.it  This test is not yet approved or cleared by the [place] FDA and has been authorized for detection and/or diagnosis of SARS-CoV-2 by FDA under an Emergency Use Authorization (EUA). This EUA will remain in effect (meaning this test can be used) for the duration of the COVID-19 declaration under Section 564(b)(1) of the Act, 21 U.S.C. section 360bbb-3(b)(1), unless the authorization is terminated or revoked.  Performed at [facility], [address] [place], [place] [address]   Culture, blood (single)     Status: None   Collection Time:  10/19/20  2:38 PM    Specimen: BLOOD  Result Value Ref Range Status   Specimen Description BLOOD SITE NOT SPECIFIED  Final   Special Requests   Final    BOTTLES DRAWN AEROBIC ONLY Blood Culture results may not be optimal due to an inadequate volume of blood received in culture bottles   Culture   Final    NO GROWTH 5 DAYS Performed at [facility], [address], [place], [place] [address]    Report Status 10/24/2020 FINAL  Final          Radiology Studies: DG Cervical Spine 1 View  Result Date: 10/24/2020 CLINICAL DATA:  Status post cervical fusion EXAM: DG CERVICAL SPINE - 1 VIEW COMPARISON:  10/16/2020 intraoperative radiographs, 09/27/2020 cervical radiographs FINDINGS: The cervical spine is imaged from the skull base through the superior aspect of C6. Redemonstrated ACDF C3-C6. No acute fracture. Alignment is unremarkable. No significant prevertebral edema. Gastric tube is noted overlying the pharynx and esophagus. IMPRESSION: Status post ACDF C3-C6.  No acute abnormality. Electronically Signed   By: Wiliam Ke M.D.   On: 10/24/2020 11:48   DG CHEST PORT 1 VIEW  Result Date: 10/23/2020 CLINICAL DATA:  Hypoxemia EXAM: PORTABLE CHEST 1 VIEW COMPARISON:  10/19/2020 FINDINGS: Single frontal view of the chest demonstrates a stable cardiac silhouette. Enteric catheter passes below diaphragm tip excluded by collimation. There is progressive bibasilar airspace disease. No effusion or pneumothorax. 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 IMPRESSION: 1. Progressive bibasilar airspace disease consistent with multifocal pneumonia. Electronically Signed   By: Sharlet Salina M.D.   On: 10/23/2020 17:22        Scheduled Meds:  buPROPion  100 mg Per Tube TID   Chlorhexidine Gluconate Cloth  6 each Topical Daily   famotidine  20 mg Per Tube BID   feeding supplement (PROSource TF)  45 mL Per Tube BID   FLUoxetine  40 mg Per Tube Daily   free water  200 mL Per Tube Q4H    insulin aspart  0-15 Units Subcutaneous Q4H   insulin aspart  7 Units Subcutaneous Q4H   insulin glargine-yfgn  35 Units Subcutaneous Daily   LORazepam  1 mg Intravenous q1600   LORazepam  2 mg Intravenous BID   losartan  50 mg Per Tube Daily   metoprolol tartrate  12.5 mg Per Tube Q6H   multivitamin with minerals  1 tablet Per Tube Daily   OLANZapine  5 mg Per Tube QHS   polyethylene glycol  17 g Per Tube Daily   rosuvastatin  20 mg Per Tube QHS   senna-docusate  1 tablet Per Tube BID   valproic acid  500 mg Per Tube TID   Continuous Infusions:  ampicillin-sulbactam (UNASYN) IV 3 g (10/25/20 0829)   feeding supplement (JEVITY 1.5 CAL/FIBER) 1,000 mL (10/24/20 1211)     LOS: 6 days   Time spent: 36 minutes.   Hughie Closs, MD [facility]  If 7PM-7AM, please contact night-coverage [email or web address]  10/25/2020, 2:21 PM

## 2020-10-25 NOTE — Progress Notes (Signed)
Physical Therapy Treatment Patient Details Name: Edward Garner MRN: [id number] DOB: 1966-01-15 Today's Date: 10/25/2020   History of Present Illness Pt is a 55 y.o. male admitted 10/19/20 with AMS, lethargy, hypoxia; workup for multifocal PNA. Head CT negative for acute abnormality. Of note, recent admission 10/3-10/4/22 for C3-6 ACDF; brief ER visit 10/4 for syncope. Other PMH includes DM, HTN, HLD, OSA, seizure, schizoaffective disorder.   PT Comments    Pt progressing with mobility. Today's session focused on transfer and gait training with RW, pt requiring up to maxA to prevent LOB with ambulation. Pt limited by generalized weakness, decreased activity tolerance, poor balance strategies/postural reactions and impaired cognition, including decreased awareness, poor attention, difficulty problem solving. Feel pt would benefit from intensive CIR-level therapies to maximize functional mobility and independence prior to return home if caregivers able to provide necessary support; d/c recs updated for CIR. Will continue to follow acutely to address established goals.  Post-mobility BP 94/66, HR 90, SpO2 91% on 8L O2 HFNC    Recommendations for follow up therapy are one component of a multi-disciplinary discharge planning process, led by the attending physician.  Recommendations may be updated based on patient status, additional functional criteria and insurance authorization.  Follow Up Recommendations  CIR;Supervision for mobility/OOB     Equipment Recommendations  Rolling walker with 5" wheels    Recommendations for Other Services       Precautions / Restrictions Precautions Precautions: Cervical;Fall;Other (comment) Precaution Comments: Cortrak, watch vitals Cervical Brace: Soft collar;At all times Restrictions Weight Bearing Restrictions: No     Mobility  Bed Mobility Overal bed mobility: Needs Assistance Bed Mobility: Rolling;Sidelying to Sit Rolling: Min  assist Sidelying to sit: Mod assist;HOB elevated       General bed mobility comments: Pt with significant difficulty following commands for sequencing; pt initiating log roll well with cues, "Like this?" but then consistently bring BLEs back into bed when cued to come to sitting, ultimately requiring modA to maintain LE positioning and assist trunk elevation    Transfers Overall transfer level: Needs assistance Equipment used: Rolling walker (2 wheeled) Transfers: Sit to/from Stand Sit to Stand: Min assist;+2 safety/equipment         General transfer comment: minA for trunk elevation and stability  Ambulation/Gait Ambulation/Gait assistance: Min assist;Mod assist;Max assist;+2 safety/equipment Gait Distance (Feet): 28 Feet Assistive device: Rolling walker (2 wheeled) Gait Pattern/deviations: Step-through pattern;Decreased stride length;Step-to pattern;Shuffle;Ataxic;Drifts right/left;Leaning posteriorly Gait velocity: Decreased   General Gait Details: Slow, unsteady gait with RW, initially requiring modA for RW management as pt twisting body within walker so it was sideways; improved RW management after a few steps, requiring increased assist with fatigue, at times requiring mod-maxA to prevent LOB and premature sitting; max, multimodal cues for safety and sequencing; +2 assist for lines   Stairs             Wheelchair Mobility    Modified Rankin (Stroke Patients Only)       Balance Overall balance assessment: Needs assistance   Sitting balance-Leahy Scale: Fair Sitting balance - Comments: min guard for safety at EOB     Standing balance-Leahy Scale: Poor Standing balance comment: Reliant on UE support or external assist; pt taking hands off RW with LOB requiring external assist to correct                            Cognition Arousal/Alertness: Awake/alert Behavior During Therapy: Restless;Impulsive Overall Cognitive Status: Impaired/Different  from  baseline Area of Impairment: Orientation;Attention;Memory;Following commands;Safety/judgement;Awareness;Problem solving                 Orientation Level: Disoriented to;Place;Time;Situation Current Attention Level: Focused;Sustained Memory: Decreased short-term memory;Decreased recall of precautions Following Commands: Follows one step commands inconsistently;Follows one step commands with increased time Safety/Judgement: Decreased awareness of safety;Decreased awareness of deficits Awareness: Intellectual Problem Solving: Slow processing;Decreased initiation;Difficulty sequencing;Requires verbal cues;Requires tactile cues General Comments: Requires constant redirection to current task, inconsistently following simple commands; very poor awareness. Pt having difficulty keeping eyes open at beginning and end of session, but cooperative and participatory      Exercises      General Comments General comments (skin integrity, edema, etc.): Mother present at beginning of session, interested in pursuing CIR; discussed need for 24/7 assist at d/c (among other things) for CIR admission, expect pt could likely tolerate 3 hr/day therapies      Pertinent Vitals/Pain Pain Assessment: No/denies pain Pain Intervention(s): Monitored during session    Home Living                      Prior Function            PT Goals (current goals can now be found in the care plan section) Progress towards PT goals: Progressing toward goals    Frequency    Min 3X/week      PT Plan Discharge plan needs to be updated    Co-evaluation              AM-PAC PT "6 Clicks" Mobility   Outcome Measure  Help needed turning from your back to your side while in a flat bed without using bedrails?: A Little Help needed moving from lying on your back to sitting on the side of a flat bed without using bedrails?: A Lot Help needed moving to and from a bed to a chair (including a wheelchair)?:  A Lot Help needed standing up from a chair using your arms (e.g., wheelchair or bedside chair)?: A Little Help needed to walk in hospital room?: A Lot Help needed climbing 3-5 steps with a railing? : Total 6 Click Score: 13    End of Session Equipment Utilized During Treatment: Gait belt;Oxygen Activity Tolerance: Patient tolerated treatment well Patient left: in chair;with call bell/phone within reach;with chair alarm set;with nursing/sitter in room Nurse Communication: Mobility status;Precautions PT Visit Diagnosis: Other abnormalities of gait and mobility (R26.89);History of falling (Z91.81);Difficulty in walking, not elsewhere classified (R26.2);Muscle weakness (generalized) (M62.81)     Time: 7591-6384 PT Time Calculation (min) (ACUTE ONLY): 33 min  Charges:  $Gait Training: 8-22 mins $Therapeutic Activity: 8-22 mins                     Ina Homes, PT, DPT Acute Rehabilitation Services  Pager [phone] Office [phone]  Malachy Chamber 10/25/2020, 5:27 PM

## 2020-10-26 ENCOUNTER — Inpatient Hospital Stay (HOSPITAL_COMMUNITY): Payer: Medicare Other

## 2020-10-26 DIAGNOSIS — Z79899 Other long term (current) drug therapy: Secondary | ICD-10-CM | POA: Diagnosis not present

## 2020-10-26 DIAGNOSIS — F05 Delirium due to known physiological condition: Secondary | ICD-10-CM | POA: Diagnosis not present

## 2020-10-26 DIAGNOSIS — J9601 Acute respiratory failure with hypoxia: Secondary | ICD-10-CM | POA: Diagnosis not present

## 2020-10-26 LAB — CBC WITH DIFFERENTIAL/PLATELET
Abs Immature Granulocytes: 0 10*3/uL (ref 0.00–0.07)
Basophils Absolute: 0.1 10*3/uL (ref 0.0–0.1)
Basophils Relative: 1 %
Eosinophils Absolute: 0.1 10*3/uL (ref 0.0–0.5)
Eosinophils Relative: 1 %
HCT: 34 % — ABNORMAL LOW (ref 39.0–52.0)
Hemoglobin: 11.2 g/dL — ABNORMAL LOW (ref 13.0–17.0)
Lymphocytes Relative: 15 %
Lymphs Abs: 2.1 10*3/uL (ref 0.7–4.0)
MCH: 32.7 pg (ref 26.0–34.0)
MCHC: 32.9 g/dL (ref 30.0–36.0)
MCV: 99.1 fL (ref 80.0–100.0)
Monocytes Absolute: 1.2 10*3/uL — ABNORMAL HIGH (ref 0.1–1.0)
Monocytes Relative: 9 %
Neutro Abs: 10.1 10*3/uL — ABNORMAL HIGH (ref 1.7–7.7)
Neutrophils Relative %: 74 %
Platelets: 365 10*3/uL (ref 150–400)
RBC: 3.43 MIL/uL — ABNORMAL LOW (ref 4.22–5.81)
RDW: 13.9 % (ref 11.5–15.5)
WBC: 13.7 10*3/uL — ABNORMAL HIGH (ref 4.0–10.5)
nRBC: 0.1 % (ref 0.0–0.2)
nRBC: 1 /100 WBC — ABNORMAL HIGH

## 2020-10-26 LAB — GLUCOSE, CAPILLARY
Glucose-Capillary: 188 mg/dL — ABNORMAL HIGH (ref 70–99)
Glucose-Capillary: 166 mg/dL — ABNORMAL HIGH (ref 70–99)
Glucose-Capillary: 286 mg/dL — ABNORMAL HIGH (ref 70–99)
Glucose-Capillary: 240 mg/dL — ABNORMAL HIGH (ref 70–99)
Glucose-Capillary: 131 mg/dL — ABNORMAL HIGH (ref 70–99)
Glucose-Capillary: 161 mg/dL — ABNORMAL HIGH (ref 70–99)

## 2020-10-26 LAB — BASIC METABOLIC PANEL
Anion gap: 6 (ref 5–15)
BUN: 21 mg/dL — ABNORMAL HIGH (ref 6–20)
CO2: 37 mmol/L — ABNORMAL HIGH (ref 22–32)
Calcium: 9.2 mg/dL (ref 8.9–10.3)
Chloride: 103 mmol/L (ref 98–111)
Creatinine, Ser: 0.85 mg/dL (ref 0.61–1.24)
GFR, Estimated: >60 mL/min (ref >60)
Glucose, Bld: 199 mg/dL — ABNORMAL HIGH (ref 70–99)
Potassium: 4 mmol/L (ref 3.5–5.1)
Sodium: 146 mmol/L — ABNORMAL HIGH (ref 135–145)

## 2020-10-26 MED ORDER — INSULIN GLARGINE-YFGN 100 UNIT/ML ~~LOC~~ SOLN
45.0000 [IU] | Freq: Every day | SUBCUTANEOUS | Status: DC
  Administered 2020-10-26 – 2020-10-30 (×4): 45 [IU] via SUBCUTANEOUS
  Filled 2020-10-26 (×7): qty 0.45

## 2020-10-26 NOTE — Progress Notes (Signed)
PROGRESS NOTE    Edward Garner  DXI:[id number] DOB: [birth year] DOA: 10/19/2020 PCP: [name], MD   Brief Narrative: 55 year old with past medical history significant for smoker, OSA, hypertension, hyperlipidemia, diabetes type 2, seizure and cervical spondylosis/stenosis status post ACDF on 10/3 presenting from home with confusion.  Patient recently underwent cervical ACDF C3 C6 with Dr. [name] on 10/3 and discharged home on 10/4.  Patient presented to the ER later that day after a syncopal episode at home with negative CT head and cervical CT, requiring scalp laceration repair.  Subsequently was discharged home.  Patient returned to the ER the day of admission due to progressive lethargy and found by EMS with oxygen saturation in the 70s, he was placed on normal breather mask and received Narcan with significant improvement.  Poor oral intake due to ongoing pain and some difficulty with swallowing. Patient was admitted with multifocal pneumonia.  CTA was negative for PE.  CT cervical which showed slightly increased prevertebral swelling compared to 10/4, P diameter of the larynx the level of the epiglottis to 5 mm.  Significant Hospital events: 10/3 cervical ACDF C3-C6 with Dr. [name], discharged home 10/4 10/4 syncope at home, ER eval neg, discharged home 10/6 Admit to PCCM for acute hypoxic respiratory failure 2/2 multifocal pna, on BiPAP 10/7 On 11L [place]  Assessment & Plan:   Active Problems:   Hypoxemia   Acute respiratory failure with hypoxia (HCC)   Malnutrition of moderate degree   Delirium due to another medical condition   Polypharmacy   Dysphagia  1-Acute Hypoxic respiratory failure in the setting of multifocal pneumonia: Concern for aspiration event with prevertebral swelling status post C-spine fusion -History of OSA on CPAP Continue with core track and n.p.o. with speech following Continue with Unasyn day 7/7 treatment for PNA.  Continue with oxygen supplementation  and try to wean as able to.  Currently still requiring 4 L oxygen and saturating around 91%. CCM re-consulted. Worsening hypoxemia thought to be related to hypoventilation/atelectasis.  Continue and encouraged incentive spirometry.  Continue pulmonary hygiene.  2-Dysphagia: CT with slightly increased prevertebral swelling compared to 10/4 Continue with core track and tube feeding Continue NPO. Speech following.  MBS ordered. Received IV steroids.   Hypernatremia:; Resolved.  Diabetes type 2: Significantly hyperglycemic.  On Semglee 35 units.  Will increase that to 45 units and continue NovoLog 7 units every 4 hours.  Continue SSI as well.  Seizure disorder: Continue with Depakote.   Bipolar: Continue with Prozac and Wellbutrin. Holding Lyrica He reporter  to nurse last night he wants to jump out of window.. -psych recommend continue with bupropion valporic acid and Prozac. -hold adderal and Vyvasne.  -Alprazolam change to ativan.  -Psych recommend schedule pain medication, his pain is very well controlled.  With as needed medications.  Patient started on Zyprexa for steroid-induced agitation on 10/25/2020 and psychiatry does not recommend continuing that at the time of discharge.  Hypomagnesemia: Resolved.  Hypertension: Slightly on the lower side, treated with Cozaar and metoprolol, we will continue that with close monitoring.  Recent C-spine fusion by Dr. [name] 10/4: Neurosurgery following   Nutrition Problem: Moderate Malnutrition Etiology: chronic illness    Signs/Symptoms: moderate fat depletion, moderate muscle depletion    Interventions: MVI, Tube feeding  Estimated body mass index is 21.2 kg/m as calculated from the following:   Height as of this encounter: 6' (1.829 m).   Weight as of this encounter: 70.9 kg.   DVT prophylaxis: SCD Code  Status: Full code Family Communication: Mother at bedside Disposition Plan:  Status is: Inpatient  Remains inpatient  appropriate because:IV treatments appropriate due to intensity of illness or inability to take PO  Dispo: The patient is from: Home              Anticipated d/c is to: Home              Patient currently is not medically stable to d/c.   Difficult to place patient No   Consultants:  Neurosurgery   Procedures:    Antimicrobials:    Subjective: Patient seen and examined.  Slightly more awake and slightly more oriented compared to yesterday.  No shortness of breath or any other complaint.  Objective: Vitals:   10/26/20 0800 10/26/20 0819 10/26/20 0853 10/26/20 1123  BP: 98/66  91/80   Pulse: 91  96   Resp: 12  11   Temp:  97.8 F (36.6 C)  98.1 F (36.7 C)  TempSrc:  Oral  Oral  SpO2: 94%  94%   Weight:      Height:        Intake/Output Summary (Last 24 hours) at 10/26/2020 1250 Last data filed at 10/26/2020 1016 Gross per 24 hour  Intake 1603 ml  Output 1902 ml  Net -299 ml    Filed Weights   10/24/20 0458 10/25/20 0500 10/26/20 0500  Weight: 71.8 kg 71 kg 70.9 kg    Examination:  General exam: Appears calm and comfortable cortrak in place. Respiratory system: Clear to auscultation. Respiratory effort normal. Cardiovascular system: S1 & S2 heard, RRR. No JVD, murmurs, rubs, gallops or clicks. No pedal edema. Gastrointestinal system: Abdomen is nondistended, soft and nontender. No organomegaly or masses felt. Normal bowel sounds heard. Central nervous system: Alert and oriented x1. No focal neurological deficits. Extremities: Symmetric 5 x 5 power.  Data Reviewed: I have personally reviewed following labs and imaging studies  CBC: Recent Labs  Lab 10/19/20 1421 10/19/20 1438 10/20/20 0306 10/21/20 0453 10/22/20 0759 10/23/20 0056 10/24/20 0618 10/26/20 0239  WBC 6.6  --  8.4 11.4* 13.8* 11.1* 10.5 13.7*  NEUTROABS 4.1  --  4.9  --   --   --   --  10.1*  HGB 11.4*   < > 11.3* 12.5* 12.2* 11.7* 11.3* 11.2*  HCT 34.7*   < > 33.9* 36.8* 37.0* 36.0*  34.1* 34.0*  MCV 99.7  --  98.3 96.1 98.4 100.3* 96.6 99.1  PLT 183  --  208 292 319 294 297 365   < > = values in this interval not displayed.    Basic Metabolic Panel: Recent Labs  Lab 10/20/20 0306 10/20/20 1428 10/20/20 1655 10/21/20 0453 10/21/20 1620 10/22/20 0759 10/22/20 2023 10/23/20 0056 10/23/20 0813 10/23/20 1833 10/24/20 0618 10/26/20 0239  NA 144  --   --  148*  --  159*   < > 155* 149* 145 143 146*  K 3.9  --   --  3.7  --  3.6   < > 3.5 3.7 4.5 4.5 4.0  CL 112*  --   --  114*  --  120*   < > 116* 108 106 96* 103  CO2 19*  --   --  23  --  32   < > 32 32 32 36* 37*  GLUCOSE 104*  --   --  158*  --  252*   < > 190* 302* 243* 379* 199*  BUN 18  --   --  27*  --  24*   < > 15 14 15  24* 21*  CREATININE 1.10  --   --  0.90  --  0.78   < > 0.66 0.77 0.71 0.80 0.85  CALCIUM 9.5  --   --  10.0  --  10.0   < > 9.7 9.5 9.5 9.8 9.2  MG 1.7 1.9 2.1 2.2 2.2 2.2  --   --   --   --   --   --   PHOS 3.7 3.7 3.6 3.2 2.5  --   --   --   --   --   --   --    < > = values in this interval not displayed.    GFR: Estimated Creatinine Clearance: 100.8 mL/min (by C-G formula based on SCr of 0.85 mg/dL). Liver Function Tests: Recent Labs  Lab 10/19/20 1421 10/24/20 0618  AST 19 58*  ALT 14 127*  ALKPHOS 46 96  BILITOT 1.0 0.4  PROT 5.8* 5.8*  ALBUMIN 2.7* 2.0*    No results for input(s): LIPASE, AMYLASE in the last 168 hours. Recent Labs  Lab 10/24/20 1031  AMMONIA 22    Coagulation Profile: No results for input(s): INR, PROTIME in the last 168 hours.  Cardiac Enzymes: No results for input(s): CKTOTAL, CKMB, CKMBINDEX, TROPONINI in the last 168 hours. BNP (last 3 results) No results for input(s): PROBNP in the last 8760 hours. HbA1C: No results for input(s): HGBA1C in the last 72 hours. CBG: Recent Labs  Lab 10/25/20 1945 10/25/20 2312 10/26/20 0344 10/26/20 0817 10/26/20 1122  GLUCAP 228* 170* 188* 166* 286*    Lipid Profile: No results for  input(s): CHOL, HDL, LDLCALC, TRIG, CHOLHDL, LDLDIRECT in the last 72 hours. Thyroid Function Tests: No results for input(s): TSH, T4TOTAL, FREET4, T3FREE, THYROIDAB in the last 72 hours. Anemia Panel: No results for input(s): VITAMINB12, FOLATE, FERRITIN, TIBC, IRON, RETICCTPCT in the last 72 hours. Sepsis Labs: Recent Labs  Lab 10/19/20 2048  LATICACIDVEN 1.3     Recent Results (from the past 240 hour(s))  Urine Culture     Status: None   Collection Time: 10/19/20  1:48 PM   Specimen: Urine, Clean Catch  Result Value Ref Range Status   Specimen Description URINE, CLEAN CATCH  Final   Special Requests NONE  Final   Culture   Final    NO GROWTH Performed at [facility], [address] [place], [place]    Report Status 10/20/2020 FINAL  Final  Resp Panel by RT-PCR (Flu A&B, Covid) Nasopharyngeal Swab     Status: None   Collection Time: 10/19/20  2:31 PM   Specimen: Nasopharyngeal Swab; Nasopharyngeal(NP) swabs in vial transport medium  Result Value Ref Range Status   SARS Coronavirus 2 by RT PCR NEGATIVE NEGATIVE Final    Comment: (NOTE) SARS-CoV-2 target nucleic acids are NOT DETECTED.  The SARS-CoV-2 RNA is generally detectable in upper respiratory specimens during the acute phase of infection. The lowest concentration of SARS-CoV-2 viral copies this assay can detect is 138 copies/mL. A negative result does not preclude SARS-Cov-2 infection and should not be used as the sole basis for treatment or other patient management decisions. A negative result may occur with  improper specimen collection/handling, submission of specimen other than nasopharyngeal swab, presence of viral mutation(s) within the areas targeted by this assay, and inadequate number of viral copies(<138 copies/mL). A negative result must be combined with clinical observations, patient history,  and epidemiological information. The expected result is Negative.  Fact Sheet for Patients:   BloggerCourse.com  Fact Sheet for Healthcare Providers:  SeriousBroker.it  This test is no t yet approved or cleared by the [place] FDA and  has been authorized for detection and/or diagnosis of SARS-CoV-2 by FDA under an Emergency Use Authorization (EUA). This EUA will remain  in effect (meaning this test can be used) for the duration of the COVID-19 declaration under Section 564(b)(1) of the Act, 21 U.S.C.section 360bbb-3(b)(1), unless the authorization is terminated  or revoked sooner.       Influenza A by PCR NEGATIVE NEGATIVE Final   Influenza B by PCR NEGATIVE NEGATIVE Final    Comment: (NOTE) The Xpert Xpress SARS-CoV-2/FLU/RSV plus assay is intended as an aid in the diagnosis of influenza from Nasopharyngeal swab specimens and should not be used as a sole basis for treatment. Nasal washings and aspirates are unacceptable for Xpert Xpress SARS-CoV-2/FLU/RSV testing.  Fact Sheet for Patients: BloggerCourse.com  Fact Sheet for Healthcare Providers: SeriousBroker.it  This test is not yet approved or cleared by the [place] FDA and has been authorized for detection and/or diagnosis of SARS-CoV-2 by FDA under an Emergency Use Authorization (EUA). This EUA will remain in effect (meaning this test can be used) for the duration of the COVID-19 declaration under Section 564(b)(1) of the Act, 21 U.S.C. section 360bbb-3(b)(1), unless the authorization is terminated or revoked.  Performed at [facility], [address] [place], [place] [address]   Culture, blood (single)     Status: None   Collection Time: 10/19/20  2:38 PM   Specimen: BLOOD  Result Value Ref Range Status   Specimen Description BLOOD SITE NOT SPECIFIED  Final   Special Requests   Final    BOTTLES DRAWN AEROBIC ONLY Blood Culture results may not be optimal due to an inadequate volume of blood  received in culture bottles   Culture   Final    NO GROWTH 5 DAYS Performed at [facility], [address] [place], [place] [address]    Report Status 10/24/2020 FINAL  Final          Radiology Studies: No results found.      Scheduled Meds:  buPROPion  100 mg Per Tube TID   Chlorhexidine Gluconate Cloth  6 each Topical Daily   famotidine  20 mg Per Tube BID   feeding supplement (PROSource TF)  45 mL Per Tube BID   FLUoxetine  40 mg Per Tube Daily   free water  200 mL Per Tube Q4H   insulin aspart  0-15 Units Subcutaneous Q4H   insulin aspart  7 Units Subcutaneous Q4H   insulin glargine-yfgn  45 Units Subcutaneous Daily   LORazepam  1 mg Intravenous q1600   LORazepam  2 mg Intravenous BID   losartan  50 mg Per Tube Daily   metoprolol tartrate  12.5 mg Per Tube Q6H   multivitamin with minerals  1 tablet Per Tube Daily   OLANZapine  5 mg Per Tube QHS   polyethylene glycol  17 g Per Tube Daily   rosuvastatin  20 mg Per Tube QHS   senna-docusate  1 tablet Per Tube BID   valproic acid  500 mg Per Tube TID   Continuous Infusions:  feeding supplement (JEVITY 1.5 CAL/FIBER) 1,000 mL (10/26/20 0557)     LOS: 7 days   Time spent: 30 minutes.   Hughie Closs, MD [facility]  If 7PM-7AM, please contact night-coverage [email or web address]  10/26/2020, 12:50 PM

## 2020-10-26 NOTE — Progress Notes (Signed)
O2 decrease to 4L/ HFNC.

## 2020-10-26 NOTE — Progress Notes (Signed)
Subjective: Patient reports no acute events overnight.   Objective: Vital signs in last 24 hours: Temp:  [98 F (36.7 C)-98.4 F (36.9 C)] 98 F (36.7 C) (10/13 0700) Pulse Rate:  [80-103] 94 (10/13 0700) Resp:  [12-26] 19 (10/13 0700) BP: (72-135)/(39-82) 113/65 (10/13 0700) SpO2:  [86 %-100 %] 97 % (10/13 0700) Weight:  [70.9 kg] 70.9 kg (10/13 0500)  Intake/Output from previous day: 10/12 0701 - 10/13 0700 In: 1503 [NG/GT:1303; IV Piggyback:200] Out: 1502 [Urine:1502] Intake/Output this shift: No intake/output data recorded.  Neurologic: Grossly normal  Lab Results: Lab Results  Component Value Date   WBC 13.7 (H) 10/26/2020   HGB 11.2 (L) 10/26/2020   HCT 34.0 (L) 10/26/2020   MCV 99.1 10/26/2020   PLT 365 10/26/2020   Lab Results  Component Value Date   INR 1.0 10/16/2020   BMET Lab Results  Component Value Date   NA 146 (H) 10/26/2020   K 4.0 10/26/2020   CL 103 10/26/2020   CO2 37 (H) 10/26/2020   GLUCOSE 199 (H) 10/26/2020   BUN 21 (H) 10/26/2020   CREATININE 0.85 10/26/2020   CALCIUM 9.2 10/26/2020    Studies/Results: DG Cervical Spine 1 View  Result Date: 10/24/2020 CLINICAL DATA:  Status post cervical fusion EXAM: DG CERVICAL SPINE - 1 VIEW COMPARISON:  10/16/2020 intraoperative radiographs, 09/27/2020 cervical radiographs FINDINGS: The cervical spine is imaged from the skull base through the superior aspect of C6. Redemonstrated ACDF C3-C6. No acute fracture. Alignment is unremarkable. No significant prevertebral edema. Gastric tube is noted overlying the pharynx and esophagus. IMPRESSION: Status post ACDF C3-C6.  No acute abnormality. Electronically Signed   By: Wiliam Ke M.D.   On: 10/24/2020 11:48    Assessment/Plan: S/p acdf with pneumonia. Doing ok, still on O2. Continue therapy and mobilizing out of bed as much as possible. Still having difficulty swallowing.   LOS: 7 days    Edward Garner 10/26/2020, 8:01 AM

## 2020-10-26 NOTE — Consult Note (Signed)
[facility] New Psychiatric Evaluation   Service Date: October 26, 2020 LOS:  LOS: 7 days    Assessment  [facility] is a 55 y.o. male admitted medically for 10/19/2020 12:55 PM for aspiration pneumonia after a spinal procedure. He carries the psychiatric diagnoses of schizoaffective disorder vs bipolar disorder (per mom), ADHD and anxiety and has a past medical history of  adrenal disease, bipolar disorder, diabetes, hyperlipidemia, sleep apnea and spinal stenosis. Psychiatry was consulted for suicidal statments by Edward Barefoot, MD.   His current presentation of waxing and waning levels of attention and orientation is most consistent with delirium. He does carry the diagnosis of bipolar disorder vs schizoaffective disorder , and has a history of pill seeking behavior. Current outpatient psychotropic medications include alprazolam, adderall, buproprion, depakote, prozac, prazosin, vyvanse, zolpidem and historically he has had a poor response to these medications; family has significant concern for overall decompensation over past 2-3 years and overmedication/pill-seeking behavior.   He was compliant with medications prior to admission. On initial examination, patient was unable to engage in a nuanced discussion of medications due to his delirium. Overall we are working towards minimizing deliriogenic meds. Please see plan below for detailed recommendations.   10/11: pt became hyperfocused on short acting xanax not covering anxiety (gets 3 mg ER in AM at home). Not amenable to discussion that he is getting IR TID, eventually agreeable to transition to ativan at roughly the BZD equivalent dose. Balancing respiratory suppression with anxiety difficult in this pt on chronic BZD, stimulant, Z drug and opioids.   10/12: pt seems more delirious, many nonsensical responses to questions. Called steroids "suicide pills" and mom confirmed these have caused psychiatric destabilization  in past. Pt assented and mom consented to olanzapine while on steroids. Specifically discussed risk of worsening tardive dyskinesia; feel olanzapine better agent than depakote in setting of significant malnutrition and hypolabuminemia. Did not discuss BZD today - no intention to further taper until later this week, discussion significantly agitated pt yesterday, pt not currently able to have full r/b/se discussion.   10/13: marginally improved. Continues to make suicidal statements which seem provocative in nature and not in line with pt's overall reported mood - continues to have residual delirium and occasionally pulls at lines, NG tube, etc - will continue sitter for now  Diagnoses:  Active Hospital problems: Active Problems:   Hypoxemia   Acute respiratory failure with hypoxia (HCC)   Malnutrition of moderate degree   Delirium due to another medical condition   Polypharmacy   Dysphagia    Problems edited/added by me: No problems updated.  Plan  ## Safety and Observation Level:  - Based on my clinical evaluation, I estimate the patient to be at moderate  risk of self harm in the current setting - continue sitter (nursing losing LOS with transfer to new unit)  ## Medications:  --Standing: CONTINUE:   - buproprion 100 mg TID  - valproic acid 500 mg TID  - fluoxetine 40 mg qD - lorazepam 2 mg/1 mg/2 mg (home dose alprazolam 3 mg/d), roughly 15% reduction overall,    START olanzapine 5 QHS and 2.5 BID PRN agitation.   Continue to hold:   - zolpidem, home stimulants  Consider scheduling some of his opioid regimen rather than having  it be PRN. In the setting of delirium, patients often do not consistently and accurately interpret pain and/or request pain medications. While opioids may contribute to delirium -- inadequate pain control and  getting behind on pain is also likely to contribute to delirium and worsen presentation. Please consider starting scheduled pain regimen as  respiratory status improves that is appropriate based on this patients particular cause(s) of pain and prior exposure to opioids and non-opioids. Adjust scheduled pain regimen as indicated by PRN use and clinical presentation. This must be balanced against decreased respiratory drive.   We are generally making medication changes to reduce sedative burden in this patient (reducing xanax, stopping ambien).   ## Medical Decision Making Capacity:  Not specifically assessed  ## Further Work-up:  -- to be seen by neuro  ## Disposition:  --  no psychiatric contraindications to discharge at this time  ## Behavioral / Environmental:  -- ordering delirium precautions   Thank you for this consult request. Recommendations have been communicated to the primary team.  We will continue to follow at this time.   Edward [facility]    NEW   Relevant Aspects of Hospital Course:  Admitted on 10/19/2020 for AMS and was found to have aspiration pneumonia.  Patient Report:  Patient seen inearly afternoon. States he slept well. He is oriented to location, situation, and month (not year). Answers most questions appropriately although becomes tangential when allowed to speak for >30 seconds (went on about having a porsche in high school which is not true). Pain is 6/10 which is improved, mood is improved, and anxiety is better managed on current regimen than xanax 1 TID. Occasionally endorses suicidal thoughts; in context this is generally provocative and with some aim (ie more pain meds).   Does not participate in attention testing - states he would need to do adderall to do DOWB.    ROS:  Unable to complete due to level of delirium. Complained of  thirst.   Collateral information:  Mom says he had generally been less agitated but more confused.   Psychiatric History:  Information collected from pt, mother  Medical History: Past Medical History:  Diagnosis Date   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HCC)     Bipolar disorder (HCC)    Complication of anesthesia    Diabetes mellitus without complication (HCC)    Hyperlipidemia    Hypertension    Kidney disease    PONV (postoperative nausea and vomiting)    Sleep apnea    Sleep apnea, unspecified 10/19/2018   Spinal stenosis     Surgical History: Past Surgical History:  Procedure Laterality Date   ANTERIOR CERVICAL DECOMP/DISCECTOMY FUSION N/A 10/16/2020   Procedure: ACDF - C3-C4 - C4-C5 - C5-C6;  Surgeon: Tia Alert, MD;  Location: [facility] OR;  Service: Neurosurgery;  Laterality: N/A;   TONSILLECTOMY      Medications:   Current Facility-Administered Medications:    buPROPion (WELLBUTRIN) tablet 100 mg, 100 mg, Per Tube, TID, [name], MD, 100 mg at 10/26/20 1659   Chlorhexidine Gluconate Cloth 2 % PADS 6 each, 6 each, Topical, Daily, Mannam, Praveen, MD, 6 each at 10/26/20 1158   docusate (COLACE) 50 MG/5ML liquid 100 mg, 100 mg, Per Tube, BID PRN, [name], MD   famotidine (PEPCID) tablet 20 mg, 20 mg, Per Tube, BID, [name], MD, 20 mg at 10/26/20 1041   feeding supplement (JEVITY 1.5 CAL/FIBER) liquid 1,000 mL, 1,000 mL, Per Tube, Continuous, [name], MD, Last Rate: 65 mL/hr at 10/26/20 1610, 1,000 mL at 10/26/20 1610   feeding supplement (PROSource TF) liquid 45 mL, 45 mL, Per Tube, BID, [name], MD, 45 mL  at 10/26/20 1041   FLUoxetine (PROZAC) 20 MG/5ML solution 40 mg, 40 mg, Per Tube, Daily, [name], MD, 40 mg at 10/26/20 1040   free water 200 mL, 200 mL, Per Tube, Q4H, Regalado, Belkys A, MD, 200 mL at 10/26/20 1232   hydrALAZINE (APRESOLINE) injection 10 mg, 10 mg, Intravenous, Q4H PRN, Tobey Grim, NP   HYDROmorphone (DILAUDID) injection 0.5 mg, 0.5 mg, Intravenous, Q4H PRN, Regalado, Belkys A, MD, 0.5 mg at 10/26/20 1223   insulin aspart (novoLOG) injection 0-15 Units, 0-15 Units, Subcutaneous, Q4H, Eubanks, Katalina M, NP, 5 Units at 10/26/20 1659   insulin aspart  (novoLOG) injection 7 Units, 7 Units, Subcutaneous, Q4H, Whiteheart, Kathryn A, NP, 7 Units at 10/26/20 1700   insulin glargine-yfgn (SEMGLEE) injection 45 Units, 45 Units, Subcutaneous, Daily, Pahwani, Ravi, MD, 45 Units at 10/26/20 1041   ipratropium-albuterol (DUONEB) 0.5-2.5 (3) MG/3ML nebulizer solution 3 mL, 3 mL, Nebulization, Q6H PRN, Mikey College T, MD   labetalol (NORMODYNE) injection 10 mg, 10 mg, Intravenous, Q2H PRN, Janyth Contes, Katalina M, NP   lip balm (CARMEX) ointment, , Topical, PRN, Rica Mote, MD   LORazepam (ATIVAN) injection 1 mg, 1 mg, Intravenous, q1600, Lisia Westbay A, 1 mg at 10/26/20 1701   LORazepam (ATIVAN) injection 2 mg, 2 mg, Intravenous, BID, Carlson Belland A, 2 mg at 10/26/20 1156   losartan (COZAAR) tablet 50 mg, 50 mg, Per Tube, Daily, [name], MD, 50 mg at 10/26/20 1042   metoprolol tartrate (LOPRESSOR) tablet 12.5 mg, 12.5 mg, Per Tube, Q6H, Regalado, Belkys A, MD, 12.5 mg at 10/26/20 1659   multivitamin with minerals tablet 1 tablet, 1 tablet, Per Tube, Daily, [name], MD, 1 tablet at 10/26/20 1040   OLANZapine (ZYPREXA) tablet 2.5 mg, 2.5 mg, Per Tube, BID PRN, Makinlee Awwad A, 2.5 mg at 10/25/20 1609   OLANZapine (ZYPREXA) tablet 5 mg, 5 mg, Per Tube, QHS, Gayatri Teasdale A, 5 mg at 10/25/20 2234   ondansetron (ZOFRAN) injection 4 mg, 4 mg, Intravenous, Q6H PRN, Mikey College T, MD   oxyCODONE-acetaminophen (PERCOCET/ROXICET) 5-325 MG per tablet 1-2 tablet, 1-2 tablet, Per Tube, Q6H PRN, Regalado, Belkys A, MD, 1 tablet at 10/26/20 0436   polyethylene glycol (MIRALAX / GLYCOLAX) packet 17 g, 17 g, Per Tube, Daily PRN, [name], MD, 17 g at 10/25/20 0950   polyethylene glycol (MIRALAX / GLYCOLAX) packet 17 g, 17 g, Per Tube, Daily, Regalado, Belkys A, MD, 17 g at 10/26/20 1040   rosuvastatin (CRESTOR) tablet 20 mg, 20 mg, Per Tube, QHS, [name], MD, 20 mg at 10/25/20 2235   senna-docusate  (Senokot-S) tablet 1 tablet, 1 tablet, Per Tube, BID, Regalado, Belkys A, MD, 1 tablet at 10/26/20 1040   valproic acid (DEPAKENE) 250 MG/5ML solution 500 mg, 500 mg, Per Tube, TID, [name], MD, 500 mg at 10/26/20 1041  Allergies: Allergies  Allergen Reactions   Gramineae Pollens     Social History:  See HPI  Family History:  The patient's family history includes Bipolar disorder in his father; Cancer in his father; Diabetes in his father; Hyperlipidemia in his father; Hypertension in his father; Non-Hodgkin's lymphoma in his mother; Thyroid disease in his father.    Objective  Vital signs:  Temp:  [97.8 F (36.6 C)-98.4 F (36.9 C)] 98.2 F (36.8 C) (10/13 1616) Pulse Rate:  [81-103] 95 (10/13 1616) Resp:  [11-26] 18 (10/13 1616) BP: (72-135)/(39-83) 115/59 (10/13 1616) SpO2:  [86 %-100 %]  98 % (10/13 1616) Weight:  [70.9 kg] 70.9 kg (10/13 0500)  Physical Exam: Gen: Normocephalic/atraumatic Pulm: on [place], occasionally pulls at it but less than yesterday, responsive to redirection Neuro: CNII-XII grossly intact Psych: disoriented, worse than yesterday   Mental Status Exam: Appearance: Ill appearing and pale  Attitude:  Continues to be less demanding, confused   Behavior/Psychomotor: Psychomotor restlessness, pulling at [place]. Abnormal orofacial movements noted again today but no worse after start of olanzapine  Speech/Language:  Increased rate, volume  Mood: Frustrated  Affect: Congruent   Thought process: Responds appropriately to direct questions, tangential if allowed to speak >30s  Thought content:   Occasionally endorses provocative SI  Perceptual disturbances:  Not endorsed, not overtly RIS  Attention: fluctuating  Concentration: poor  Orientation: Self, situation, month (not year0  Memory: poor  Fund of knowledge:  Did not formally assess. Vocabulary quite advanced.   Insight:   poor  Judgment:  poor  Impulse Control: poor

## 2020-10-26 NOTE — Progress Notes (Signed)
Inpatient Rehabilitation Admissions Coordinator   Inpatient rehab consult received. I met with patient at bedside with sitter and then met with his Mom outside of his room. We discussed goals and expectations of a possible Cir admit. Mom states patient lives alone and she and her husband unable to be caregivers if he needs assistance at home 24/7 at home due their medical issues. Patient would need to be Mod I to return home alone with intermittent assistance. I discussed SNF may be needed if patient cognition does not improve over the next several days . I will follow up on Monday.   , RN, MSN Rehab Admissions Coordinator [phone] 10/26/2020 3:46 PM  

## 2020-10-26 NOTE — Progress Notes (Signed)
  Modified Barium Swallow Progress Note  Patient Details  Name: Edward [facility] MRN: [id number] Date of Birth: Feb 06, 1965  Today's Date: 10/26/2020  Modified Barium Swallow completed.  Full report located under Chart Review in the Imaging Section.  Brief recommendations include the following:  Clinical Impression  Pt was alert, but demonstrated difficulty attending to tasks and his implementation of compensatory strategies was variable; the impact of these on his performance is considered. Pt s/p ACDF 10/3 and hardward noted C3-C-6 with the prevetebral edema as noted on CT soft tissue. He presented with oropharyngeal dysphagia characterized by reduced bolus cohesion, reduced pharyngeal constriction, a pharyngeal delay, reduced hyolaryngeal elavation and reduced anterior laryngeal movement. Some hyolaryngeal elevation was noted but anterior laryngeal movement was reduced and epiglottic inversion was limited, at least partly, due to edema. He was able to swallow very small portions of boluses with repeated swallows, but duration of cricopharyngeal relaxation was reduced. Moderate vallecular residue and pyriform sinus residue were  noted accross limited trials. Penetration (PAS 3, 5) was noted before deglutition and aspiration (PAS 7,8) noted during and after. Aspiration frequently resulted in coughing which was inconsistently effective propelling the aspirate superior to the vocal folds, but it was often ineffective in fully expelling penetrate/aspirate from the larynx, and a recurrence of aspiration was therefore demonstrated. With a cued effortful swallow and nectar thick liquids via cup, pt was once able to demonstrate improved laryngeal vestibule closure and increased airway protection. This could not be replicated, but this reduced the amount of laryngeal invasion and resulted in there only being laryngeal invasion after the swallow. Pt is at risk for aspiration before, during and after  deglutition due to the pharyngeal delay, premature spillage, reduced airway protection, and pharyngeal residue. It is recommended that the NPO status be maitained with continued allowance of ice chips following oral care. SLP will continue to follow pt for treatment, but is anticipated that improvement in the prevertebral swelling will facilitate some increased epiglottic inversion and reduce aspiration risk.   Swallow Evaluation Recommendations       SLP Diet Recommendations: NPO;Ice chips PRN after oral care       Medication Administration: Via alternative means           Postural Changes: Seated upright at 90 degrees   Oral Care Recommendations: Oral care QID     Edward Crist I. Vear Clock, MS, CCC-SLP Acute Rehabilitation Services Office number [phone] Pager [phone]    Edward [facility]ner 10/26/2020,3:25 PM

## 2020-10-26 NOTE — Progress Notes (Signed)
Inpatient Rehab Admissions Coordinator Note:   Per updated therapy recommendations patient was screened for CIR candidacy by Stephania Fragmin, PT. At this time, pt appears to be a potential candidate for CIR. I will place an order for rehab consult for full assessment, per our protocol.  Please contact me any with questions.Estill Dooms, PT, DPT [phone] 10/26/20 9:15 AM

## 2020-10-26 NOTE — Progress Notes (Signed)
Speech Language Pathology Treatment: Dysphagia  Patient Details Name: Edward [facility] MRN: [id number] DOB: 22-Jul-1965 Today's Date: 10/26/2020 Time: 2202-5427 SLP Time Calculation (min) (ACUTE ONLY): 14 min  Assessment / Plan / Recommendation Clinical Impression  Pt was seen for dysphagia treatment with his your mother present. Pt was alert and his nurse reported that he has been tolerating ice chips without over s/sx of aspiration. Pt tolerated nectar thick liquids via tsp and 1/2 tsp boluses of lemon ice without overt s/sx of aspiration. Coughing was inconsistently noted with nectar thick liquids, but appeared to be reduced with cued effortful swallows. A mild oral hold was noted and pt reported that he was waiting for instruction to swallow. A modified barium swallow study will be conducted (scheduled for 1330) to further assess physiology and to determine possible readiness for a, likely modified, diet.    HPI HPI: Patient recently underwent cervical ACDF C3-C6 with Dr. [name] on 10/3 and discharged home on 10/4.  Patient presented to ER later that day after syncopal episode at home with negative CTH and cervical CT and required scalp laceration repair.  No other complaints, normal ddimer and hemodynamics and therefore discharged home.  He possibly had a virtual visit with his PCP yesterday with some concern for pneumonia and was started on an unknown antibiotic, only taking one dose.  Returns to ER again today for progressive lethargy at home found by EMS with O2 saturations in the 70's, placed on NRB and no improvement to narcan.  Additionally reported decrease oral intake due to ongoing pain and some difficulty swallowing, CT cervical showed slightly increased prevertebral swelling compared to 10/4 and narrowed AP diameter of the larynx the level of the epiglottis to 5 mm, CTH negative for acute intracranial process, and CTA PE was negative for PE, but showed multifocal pneumonia and thickening  of esophageal wall possibly reflecting esophagitis, and hepatomegaly.      SLP Plan  MBS      Recommendations for follow up therapy are one component of a multi-disciplinary discharge planning process, led by the attending physician.  Recommendations may be updated based on patient status, additional functional criteria and insurance authorization.    Recommendations  Medication Administration: Via alternative means                Oral Care Recommendations: Oral care QID;Oral care prior to ice chip/H20 Follow up Recommendations:  (TBD) SLP Visit Diagnosis: Dysphagia, oropharyngeal phase (R13.12) Plan: MBS       Jewelz Ricklefs I. Vear Clock, MS, CCC-SLP Acute Rehabilitation Services Office number [phone] Pager [phone]                 Scheryl Marten  10/26/2020, 9:12 AM

## 2020-10-27 ENCOUNTER — Inpatient Hospital Stay (HOSPITAL_COMMUNITY): Payer: Medicare Other

## 2020-10-27 DIAGNOSIS — J9601 Acute respiratory failure with hypoxia: Secondary | ICD-10-CM | POA: Diagnosis not present

## 2020-10-27 LAB — GLUCOSE, CAPILLARY
Glucose-Capillary: 109 mg/dL — ABNORMAL HIGH (ref 70–99)
Glucose-Capillary: 142 mg/dL — ABNORMAL HIGH (ref 70–99)
Glucose-Capillary: 157 mg/dL — ABNORMAL HIGH (ref 70–99)
Glucose-Capillary: 180 mg/dL — ABNORMAL HIGH (ref 70–99)
Glucose-Capillary: 183 mg/dL — ABNORMAL HIGH (ref 70–99)
Glucose-Capillary: 196 mg/dL — ABNORMAL HIGH (ref 70–99)

## 2020-10-27 LAB — CBC WITH DIFFERENTIAL/PLATELET
Abs Immature Granulocytes: 0.2 10*3/uL — ABNORMAL HIGH (ref 0.00–0.07)
Basophils Absolute: 0 10*3/uL (ref 0.0–0.1)
Basophils Relative: 0 %
Eosinophils Absolute: 0.1 10*3/uL (ref 0.0–0.5)
Eosinophils Relative: 1 %
HCT: 32.4 % — ABNORMAL LOW (ref 39.0–52.0)
Hemoglobin: 10.7 g/dL — ABNORMAL LOW (ref 13.0–17.0)
Immature Granulocytes: 1 %
Lymphocytes Relative: 16 %
Lymphs Abs: 2.5 10*3/uL (ref 0.7–4.0)
MCH: 32.9 pg (ref 26.0–34.0)
MCHC: 33 g/dL (ref 30.0–36.0)
MCV: 99.7 fL (ref 80.0–100.0)
Monocytes Absolute: 1.2 10*3/uL — ABNORMAL HIGH (ref 0.1–1.0)
Monocytes Relative: 8 %
Neutro Abs: 11.4 10*3/uL — ABNORMAL HIGH (ref 1.7–7.7)
Neutrophils Relative %: 74 %
Platelets: 338 10*3/uL (ref 150–400)
RBC: 3.25 MIL/uL — ABNORMAL LOW (ref 4.22–5.81)
RDW: 13.7 % (ref 11.5–15.5)
WBC: 15.3 10*3/uL — ABNORMAL HIGH (ref 4.0–10.5)
nRBC: 0 % (ref 0.0–0.2)

## 2020-10-27 LAB — BASIC METABOLIC PANEL
Anion gap: 8 (ref 5–15)
BUN: 19 mg/dL (ref 6–20)
CO2: 32 mmol/L (ref 22–32)
Calcium: 9.2 mg/dL (ref 8.9–10.3)
Chloride: 103 mmol/L (ref 98–111)
Creatinine, Ser: 0.81 mg/dL (ref 0.61–1.24)
GFR, Estimated: >60 mL/min (ref >60)
Glucose, Bld: 152 mg/dL — ABNORMAL HIGH (ref 70–99)
Potassium: 3.9 mmol/L (ref 3.5–5.1)
Sodium: 143 mmol/L (ref 135–145)

## 2020-10-27 LAB — PROCALCITONIN: Procalcitonin: 0.26 ng/mL

## 2020-10-27 MED ORDER — ENOXAPARIN SODIUM 40 MG/0.4ML IJ SOSY
40.0000 mg | PREFILLED_SYRINGE | Freq: Every day | INTRAMUSCULAR | Status: DC
  Administered 2020-10-27 – 2020-11-07 (×12): 40 mg via SUBCUTANEOUS
  Filled 2020-10-27 (×13): qty 0.4

## 2020-10-27 NOTE — Progress Notes (Signed)
Physical Therapy Treatment Patient Details Name: Edward Garner MRN: [id number] DOB: 1965-04-22 Today's Date: 10/27/2020   History of Present Illness Pt is a 55 y.o. male admitted 10/19/20 with AMS, lethargy, hypoxia; workup for multifocal PNA. Head CT negative for acute abnormality. Of note, recent admission 10/3-10/4/22 for C3-6 ACDF; brief ER visit 10/4 for syncope. Other PMH includes DM, HTN, HLD, OSA, seizure, schizoaffective disorder.    PT Comments    Pt pleasant with mumbled speech at times and difficult to understand. Pt with improved cognition and mobility yet remains confused with poor insight, lack of deficits, impulsive. Pt remains with balance and mobility deficits who will require post acute rehab prior to home. Mom present end of session to witness progression.   On arrival pt with O2 off and SpO2 84%. Returned to 4L with sats 88%. During gait on 6L maintained at 88% and at rest end of session 88% on 6L. Good pleth throughout even with new probe with RN made aware.  HR 90   Recommendations for follow up therapy are one component of a multi-disciplinary discharge planning process, led by the attending physician.  Recommendations may be updated based on patient status, additional functional criteria and insurance authorization.  Follow Up Recommendations  CIR;Supervision for mobility/OOB     Equipment Recommendations  Rolling walker with 5" wheels    Recommendations for Other Services       Precautions / Restrictions Precautions Precautions: Cervical;Fall;Other (comment) Precaution Comments: Cortrak, watch vitals Required Braces or Orthoses: Cervical Brace Cervical Brace: Soft collar;At all times     Mobility  Bed Mobility Overal bed mobility: Needs Assistance Bed Mobility: Supine to Sit Rolling: Min assist Sidelying to sit: Min assist;HOB elevated       General bed mobility comments: min assist to sequence and complete transition from side to sitting  with HOB 35 degrees. pt with consistent posterior left lean EOb with physical assist for sitting balance    Transfers Overall transfer level: Needs assistance   Transfers: Sit to/from Stand Sit to Stand: Min assist         General transfer comment: min assis to rise, cues for hand placement and safety with assist for balance with RW present  Ambulation/Gait Ambulation/Gait assistance: Mod assist Gait Distance (Feet): 80 Feet Assistive device: Rolling walker (2 wheeled) Gait Pattern/deviations: Ataxic;Trunk flexed;Drifts right/left;Decreased stride length;Shuffle   Gait velocity interpretation: <1.8 ft/sec, indicate of risk for recurrent falls General Gait Details: pt with shuffling gait with posterior left lean, veering right with RW. Mod-max cues for sequence, safety and position in RW. Pt needing frequent redirection to task and safety. Decreased speed. pt with frequent attempts to step over catheter, edge of RW or step out of RW during cues. Pt maintained 88% on 6L with gait   Stairs             Wheelchair Mobility    Modified Rankin (Stroke Patients Only)       Balance Overall balance assessment: Needs assistance   Sitting balance-Leahy Scale: Poor Sitting balance - Comments: min assist with posterior left lean Postural control: Posterior lean;Left lateral lean Standing balance support: Bilateral upper extremity supported;During functional activity Standing balance-Leahy Scale: Poor Standing balance comment: posterior left lean with bil UE support on RW and physical assist to maintain balance                            Cognition Arousal/Alertness: Awake/alert Behavior During Therapy:  Impulsive Overall Cognitive Status: Impaired/Different from baseline Area of Impairment: Orientation;Attention;Memory;Following commands;Safety/judgement;Awareness;Problem solving                 Orientation Level: Disoriented to;Place;Time Current Attention  Level: Sustained Memory: Decreased short-term memory;Decreased recall of precautions Following Commands: Follows one step commands inconsistently;Follows one step commands with increased time Safety/Judgement: Decreased awareness of safety;Decreased awareness of deficits Awareness: Intellectual Problem Solving: Slow processing;Decreased initiation;Difficulty sequencing;Requires verbal cues;Requires tactile cues General Comments: pt oriented to place not time. Pt pleasant and cooperative. Pt with consistent veering to right, posterior left lean and lack of awareness for safety. With cues to step into RW pt continued to try to step over lines or over RW. Poor awareness      Exercises      General Comments        Pertinent Vitals/Pain Pain Score: 3  Pain Location: neck Pain Descriptors / Indicators: Aching Pain Intervention(s): Limited activity within patient's tolerance;Monitored during session;Repositioned    Home Living                      Prior Function            PT Goals (current goals can now be found in the care plan section) Progress towards PT goals: Progressing toward goals    Frequency    Min 3X/week      PT Plan Current plan remains appropriate    Co-evaluation              AM-PAC PT "6 Clicks" Mobility   Outcome Measure  Help needed turning from your back to your side while in a flat bed without using bedrails?: A Little Help needed moving from lying on your back to sitting on the side of a flat bed without using bedrails?: A Little Help needed moving to and from a bed to a chair (including a wheelchair)?: A Little Help needed standing up from a chair using your arms (e.g., wheelchair or bedside chair)?: A Lot Help needed to walk in hospital room?: A Lot Help needed climbing 3-5 steps with a railing? : Total 6 Click Score: 14    End of Session Equipment Utilized During Treatment: Gait belt;Oxygen Activity Tolerance: Patient tolerated  treatment well Patient left: in chair;with call bell/phone within reach;with chair alarm set;with nursing/sitter in room;with family/visitor present Nurse Communication: Mobility status;Precautions PT Visit Diagnosis: Other abnormalities of gait and mobility (R26.89);History of falling (Z91.81);Difficulty in walking, not elsewhere classified (R26.2);Muscle weakness (generalized) (M62.81)     Time: 2563-8937 PT Time Calculation (min) (ACUTE ONLY): 32 min  Charges:  $Gait Training: 8-22 mins $Therapeutic Activity: 8-22 mins                     Tascha Casares P, PT Acute Rehabilitation Services Pager: [phone] Office: [phone]    Skylee Baird B Lam Mccubbins 10/27/2020, 10:46 AM

## 2020-10-27 NOTE — Progress Notes (Signed)
PROGRESS NOTE    Edward Garner  ZOX:[id number] DOB: Mar 16, 1965 DOA: 10/19/2020 PCP: [name], MD   Brief Narrative: 55 year old with past medical history significant for smoker, OSA, hypertension, hyperlipidemia, diabetes type 2, seizure and cervical spondylosis/stenosis status post ACDF on 10/3 presenting from home with confusion.  Patient recently underwent cervical ACDF C3 C6 with Dr. [name] on 10/3 and discharged home on 10/4.  Patient presented to the ER later that day after a syncopal episode at home with negative CT head and cervical CT, requiring scalp laceration repair.  Subsequently was discharged home.  Patient returned to the ER the day of admission due to progressive lethargy and found by EMS with oxygen saturation in the 70s, he was placed on normal breather mask and received Narcan with significant improvement.  Poor oral intake due to ongoing pain and some difficulty with swallowing. Patient was admitted with multifocal pneumonia.  CTA was negative for PE.  CT cervical which showed slightly increased prevertebral swelling compared to 10/4, P diameter of the larynx the level of the epiglottis to 5 mm.  Significant Hospital events: 10/3 cervical ACDF C3-C6 with Dr. [name], discharged home 10/4 10/4 syncope at home, ER eval neg, discharged home 10/6 Admit to PCCM for acute hypoxic respiratory failure 2/2 multifocal pna, on BiPAP 10/7 On 11L [place]  Assessment & Plan:   Active Problems:   Hypoxemia   Acute respiratory failure with hypoxia (HCC)   Malnutrition of moderate degree   Delirium due to another medical condition   Polypharmacy   Dysphagia  1-Acute Hypoxic respiratory failure in the setting of multifocal pneumonia: Concern for aspiration event with prevertebral swelling status post C-spine fusion -History of OSA on CPAP Continue with core track and n.p.o. with speech following Completed 7 days of Unasyn on 10/26/2020. Continue with oxygen supplementation and try  to wean as able to.  Currently still requiring 4 L oxygen and saturating around 91%. Worsening hypoxemia thought to be related to hypoventilation/atelectasis.  Continue and encouraged incentive spirometry.  Continue pulmonary hygiene.  Will check chest x-ray and procalcitonin today.  2-Dysphagia: CT with slightly increased prevertebral swelling compared to 10/4 Continue with core track and tube feeding. Speech following.  MBS completed which shows oropharyngeal phase dysphagia and moderate aspiration risk.  Remains NPO. Received IV steroids.   Hypernatremia:; Resolved.  Diabetes type 2: Now improving.  Continue Semglee 45 units, NovoLog 7 units every 4 hours and SSI.  Seizure disorder: Continue with Depakote.   Bipolar: Continue with Prozac and Wellbutrin. Holding Lyrica He reporter  to nurse last night he wants to jump out of window.. -psych recommend continue with bupropion valporic acid and Prozac. -hold adderal and Vyvasne.  -Alprazolam change to ativan.  -Psych recommend schedule pain medication, his pain is very well controlled.  With as needed medications.  Patient started on Zyprexa for steroid-induced agitation on 10/25/2020 and psychiatry does not recommend continuing that at the time of discharge.  Hypomagnesemia: Resolved.  Hypertension: Slightly on the lower side, treated with Cozaar and metoprolol, discontinue Cozaar but continue metoprolol.  Recent C-spine fusion by Dr. [name] 10/4: Neurosurgery following   Nutrition Problem: Moderate Malnutrition Etiology: chronic illness    Signs/Symptoms: moderate fat depletion, moderate muscle depletion    Interventions: MVI, Tube feeding  Estimated body mass index is 21.17 kg/m as calculated from the following:   Height as of this encounter: 6' (1.829 m).   Weight as of this encounter: 70.8 kg.   DVT prophylaxis: Lovenox Code  Status: Full code Family Communication: Mother at bedside Disposition Plan:  Status is:  Inpatient  Remains inpatient appropriate because:IV treatments appropriate due to intensity of illness or inability to take PO  Dispo: The patient is from: Home              Anticipated d/c is to: Home              Patient currently is not medically stable to d/c.   Difficult to place patient No   Consultants:  Neurosurgery   Procedures:    Antimicrobials:    Subjective: Patient seen and examined with her mother at the bedside.  Patient fully alert and oriented.  Better in his conversations today.  Objective: Vitals:   10/27/20 0447 10/27/20 0855 10/27/20 0900 10/27/20 1036  BP: 111/63 (!) 128/58 (!) 136/43   Pulse: 84 90 85   Resp: (!) 24 (!) 21 (!) 22   Temp: 98.9 F (37.2 C)  98.1 F (36.7 C)   TempSrc: Oral Oral Oral   SpO2: 94% 93% 95% (!) 88%  Weight:      Height:        Intake/Output Summary (Last 24 hours) at 10/27/2020 1122 Last data filed at 10/27/2020 0900 Gross per 24 hour  Intake 395 ml  Output 700 ml  Net -305 ml    Filed Weights   10/25/20 0500 10/26/20 0500 10/27/20 0443  Weight: 71 kg 70.9 kg 70.8 kg    Examination:  General exam: Appears calm and comfortable  Respiratory system: Slightly coarse breath sounds with rhonchi especially when he is coughing.  Respiratory effort normal. Cardiovascular system: S1 & S2 heard, RRR. No JVD, murmurs, rubs, gallops or clicks. No pedal edema. Gastrointestinal system: Abdomen is nondistended, soft and nontender. No organomegaly or masses felt. Normal bowel sounds heard. Central nervous system: Alert and oriented. No focal neurological deficits. Extremities: Symmetric 5 x 5 power. Skin: No rashes, lesions or ulcers.  Psychiatry: Judgement and insight appear poor   Data Reviewed: I have personally reviewed following labs and imaging studies  CBC: Recent Labs  Lab 10/22/20 0759 10/23/20 0056 10/24/20 0618 10/26/20 0239 10/27/20 0402  WBC 13.8* 11.1* 10.5 13.7* 15.3*  NEUTROABS  --   --   --   10.1* 11.4*  HGB 12.2* 11.7* 11.3* 11.2* 10.7*  HCT 37.0* 36.0* 34.1* 34.0* 32.4*  MCV 98.4 100.3* 96.6 99.1 99.7  PLT 319 294 297 365 338    Basic Metabolic Panel: Recent Labs  Lab 10/20/20 1428 10/20/20 1655 10/21/20 0453 10/21/20 0453 10/21/20 1620 10/22/20 0759 10/22/20 2023 10/23/20 0813 10/23/20 1833 10/24/20 0618 10/26/20 0239 10/27/20 0402  NA  --   --  148*   < >  --  159*   < > 149* 145 143 146* 143  K  --   --  3.7   < >  --  3.6   < > 3.7 4.5 4.5 4.0 3.9  CL  --   --  114*   < >  --  120*   < > 108 106 96* 103 103  CO2  --   --  23   < >  --  32   < > 32 32 36* 37* 32  GLUCOSE  --   --  158*   < >  --  252*   < > 302* 243* 379* 199* 152*  BUN  --   --  27*   < >  --  24*   < > 14 15 24* 21* 19  CREATININE  --   --  0.90   < >  --  0.78   < > 0.77 0.71 0.80 0.85 0.81  CALCIUM  --   --  10.0   < >  --  10.0   < > 9.5 9.5 9.8 9.2 9.2  MG 1.9 2.1 2.2  --  2.2 2.2  --   --   --   --   --   --   PHOS 3.7 3.6 3.2  --  2.5  --   --   --   --   --   --   --    < > = values in this interval not displayed.    GFR: Estimated Creatinine Clearance: 105.6 mL/min (by C-G formula based on SCr of 0.81 mg/dL). Liver Function Tests: Recent Labs  Lab 10/24/20 0618  AST 58*  ALT 127*  ALKPHOS 96  BILITOT 0.4  PROT 5.8*  ALBUMIN 2.0*    No results for input(s): LIPASE, AMYLASE in the last 168 hours. Recent Labs  Lab 10/24/20 1031  AMMONIA 22    Coagulation Profile: No results for input(s): INR, PROTIME in the last 168 hours.  Cardiac Enzymes: No results for input(s): CKTOTAL, CKMB, CKMBINDEX, TROPONINI in the last 168 hours. BNP (last 3 results) No results for input(s): PROBNP in the last 8760 hours. HbA1C: No results for input(s): HGBA1C in the last 72 hours. CBG: Recent Labs  Lab 10/26/20 2025 10/26/20 2327 10/27/20 0430 10/27/20 0804 10/27/20 1120  GLUCAP 131* 161* 180* 109* 196*    Lipid Profile: No results for input(s): CHOL, HDL, LDLCALC, TRIG,  CHOLHDL, LDLDIRECT in the last 72 hours. Thyroid Function Tests: No results for input(s): TSH, T4TOTAL, FREET4, T3FREE, THYROIDAB in the last 72 hours. Anemia Panel: No results for input(s): VITAMINB12, FOLATE, FERRITIN, TIBC, IRON, RETICCTPCT in the last 72 hours. Sepsis Labs: No results for input(s): PROCALCITON, LATICACIDVEN in the last 168 hours.   Recent Results (from the past 240 hour(s))  Urine Culture     Status: None   Collection Time: 10/19/20  1:48 PM   Specimen: Urine, Clean Catch  Result Value Ref Range Status   Specimen Description URINE, CLEAN CATCH  Final   Special Requests NONE  Final   Culture   Final    NO GROWTH Performed at [facility], [address], [place], [place] [address]    Report Status 10/20/2020 FINAL  Final  Resp Panel by RT-PCR (Flu A&B, Covid) Nasopharyngeal Swab     Status: None   Collection Time: 10/19/20  2:31 PM   Specimen: Nasopharyngeal Swab; Nasopharyngeal(NP) swabs in vial transport medium  Result Value Ref Range Status   SARS Coronavirus 2 by RT PCR NEGATIVE NEGATIVE Final    Comment: (NOTE) SARS-CoV-2 target nucleic acids are NOT DETECTED.  The SARS-CoV-2 RNA is generally detectable in upper respiratory specimens during the acute phase of infection. The lowest concentration of SARS-CoV-2 viral copies this assay can detect is 138 copies/mL. A negative result does not preclude SARS-Cov-2 infection and should not be used as the sole basis for treatment or other patient management decisions. A negative result may occur with  improper specimen collection/handling, submission of specimen other than nasopharyngeal swab, presence of viral mutation(s) within the areas targeted by this assay, and inadequate number of viral copies(<138 copies/mL). A negative result must be combined with clinical observations, patient history, and epidemiological  information. The expected result is Negative.  Fact Sheet for Patients:   BloggerCourse.com  Fact Sheet for Healthcare Providers:  SeriousBroker.it  This test is no t yet approved or cleared by the [place] FDA and  has been authorized for detection and/or diagnosis of SARS-CoV-2 by FDA under an Emergency Use Authorization (EUA). This EUA will remain  in effect (meaning this test can be used) for the duration of the COVID-19 declaration under Section 564(b)(1) of the Act, 21 U.S.C.section 360bbb-3(b)(1), unless the authorization is terminated  or revoked sooner.       Influenza A by PCR NEGATIVE NEGATIVE Final   Influenza B by PCR NEGATIVE NEGATIVE Final    Comment: (NOTE) The Xpert Xpress SARS-CoV-2/FLU/RSV plus assay is intended as an aid in the diagnosis of influenza from Nasopharyngeal swab specimens and should not be used as a sole basis for treatment. Nasal washings and aspirates are unacceptable for Xpert Xpress SARS-CoV-2/FLU/RSV testing.  Fact Sheet for Patients: BloggerCourse.com  Fact Sheet for Healthcare Providers: SeriousBroker.it  This test is not yet approved or cleared by the [place] FDA and has been authorized for detection and/or diagnosis of SARS-CoV-2 by FDA under an Emergency Use Authorization (EUA). This EUA will remain in effect (meaning this test can be used) for the duration of the COVID-19 declaration under Section 564(b)(1) of the Act, 21 U.S.C. section 360bbb-3(b)(1), unless the authorization is terminated or revoked.  Performed at [facility], [address], [place], [place] [address]   Culture, blood (single)     Status: None   Collection Time: 10/19/20  2:38 PM   Specimen: BLOOD  Result Value Ref Range Status   Specimen Description BLOOD SITE NOT SPECIFIED  Final   Special Requests   Final    BOTTLES DRAWN AEROBIC ONLY Blood Culture results may not be optimal due to an inadequate volume of blood  received in culture bottles   Culture   Final    NO GROWTH 5 DAYS Performed at [facility], [address] [place], [place] [address]    Report Status 10/24/2020 FINAL  Final          Radiology Studies: DG Swallowing Func-Speech Pathology  Result Date: 10/26/2020 Table formatting from the original result was not included. Objective Swallowing Evaluation: Type of Study: MBS-Modified Barium Swallow Study  Patient Details Name: Chancellor Vanderloop MRN: [id number] Date of Birth: 03/31/1965 Today's Date: 10/26/2020 Time: SLP Start Time (ACUTE ONLY): 1320 -SLP Stop Time (ACUTE ONLY): 1332 SLP Time Calculation (min) (ACUTE ONLY): 12 min Past Medical History: Past Medical History: Diagnosis Date  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HCC)   Bipolar disorder (HCC)   Complication of anesthesia   Diabetes mellitus without complication (HCC)   Hyperlipidemia   Hypertension   Kidney disease   PONV (postoperative nausea and vomiting)   Sleep apnea   Sleep apnea, unspecified 10/19/2018  Spinal stenosis  Past Surgical History: Past Surgical History: Procedure Laterality Date  ANTERIOR CERVICAL DECOMP/DISCECTOMY FUSION N/A 10/16/2020  Procedure: ACDF - C3-C4 - C4-C5 - C5-C6;  Surgeon: Tia Alert, MD;  Location: [facility] OR;  Service: Neurosurgery;  Laterality: N/A;  TONSILLECTOMY   HPI: Pt is a 55 y/o male who recently underwent cervical ACDF C3-C6 on 10/3 and discharged home on 10/4.  Patient presented to ER later that day after syncopal episode at home with negative CTH and cervical CT and required scalp laceration repair.  No other complaints, normal ddimer and hemodynamics and therefore discharged  home. Pt returned to tje ER again for progressive lethargy at home and was found by EMS with O2 saturations in the 70's, placed on NRB and no improvement to narcan.  Additionally reported decrease oral intake due to ongoing pain and some difficulty swallowing, CT cervical showed slightly increased prevertebral swelling compared to 10/4  and narrowed AP diameter of the larynx the level of the epiglottis to 5 mm, CTH negative for acute intracranial process, and CTA PE was negative for PE, but showed multifocal pneumonia and thickening of esophageal wall possibly reflecting esophagitis, and hepatomegaly.  No data recorded Assessment / Plan / Recommendation CHL IP CLINICAL IMPRESSIONS 10/26/2020 Clinical Impression Pt was alert, but demonstrated difficulty attending to tasks and his implementation of compensatory strategies was variable; the impact of these on his performance is considered. Pt s/p ACDF 10/3 and hardward noted C3-C-6 with the prevetebral edema as noted on CT soft tissue. He presented with oropharyngeal dysphagia characterized by reduced bolus cohesion, reduced pharyngeal constriction, a pharyngeal delay, reduced hyolaryngeal elavation and reduced anterior laryngeal movement. Some hyolaryngeal elevation was noted but anterior laryngeal movement was reduced and epiglottic inversion was limited, at least partly, due to edema. He was able to swallow very small portions of boluses with repeated swallows, but duration of cricopharyngeal relaxation was reduced. Moderate vallecular residue and pyriform sinus residue were  noted accross limited trials. Penetration (PAS 3, 5) was noted before deglutition and aspiration (PAS 7,8) noted during and after. Aspiration frequently resulted in coughing which was inconsistently effective propelling the aspirate superior to the vocal folds, but it was often ineffective in fully expelling penetrate/aspirate from the larynx, and a recurrence of aspiration was therefore demonstrated. With a cued effortful swallow and nectar thick liquids via cup, pt was once able to demonstrate improved laryngeal vestibule closure and increased airway protection. This could not be replicated, but this reduced the amount of laryngeal invasion and resulted in there only being laryngeal invasion after the swallow. Pt is at risk  for aspiration before, during and after deglutition due to the pharyngeal delay, premature spillage, reduced airway protection, and pharyngeal residue. It is recommended that the NPO status be maitained with continued allowance of ice chips following oral care. SLP will continue to follow pt for treatment, but is anticipated that improvement in the pharyngeal edema will facilitate some increased epiglottic inversion and reduce aspiration risk. SLP Visit Diagnosis Dysphagia, oropharyngeal phase (R13.12) Attention and concentration deficit following -- Frontal lobe and executive function deficit following -- Impact on safety and function Moderate aspiration risk   CHL IP TREATMENT RECOMMENDATION 10/26/2020 Treatment Recommendations Therapy as outlined in treatment plan below   Prognosis 10/26/2020 Prognosis for Safe Diet Advancement Good Barriers to Reach Goals Severity of deficits Barriers/Prognosis Comment -- CHL IP DIET RECOMMENDATION 10/26/2020 SLP Diet Recommendations NPO;Ice chips PRN after oral care Liquid Administration via -- Medication Administration Via alternative means Compensations -- Postural Changes Seated upright at 90 degrees   CHL IP OTHER RECOMMENDATIONS 10/26/2020 Recommended Consults -- Oral Care Recommendations Oral care QID Other Recommendations --   CHL IP FOLLOW UP RECOMMENDATIONS 10/26/2020 Follow up Recommendations None   CHL IP FREQUENCY AND DURATION 10/26/2020 Speech Therapy Frequency (ACUTE ONLY) min 2x/week Treatment Duration 2 weeks      CHL IP ORAL PHASE 10/26/2020 Oral Phase Impaired Oral - Pudding Teaspoon -- Oral - Pudding Cup -- Oral - Honey Teaspoon -- Oral - Honey Cup -- Oral - Nectar Teaspoon Decreased bolus cohesion;Premature spillage Oral - Nectar  Cup Decreased bolus cohesion;Premature spillage Oral - Nectar Straw -- Oral - Thin Teaspoon Decreased bolus cohesion;Premature spillage Oral - Thin Cup -- Oral - Thin Straw -- Oral - Puree -- Oral - Mech Soft -- Oral - Regular --  Oral - Multi-Consistency -- Oral - Pill -- Oral Phase - Comment --  CHL IP PHARYNGEAL PHASE 10/26/2020 Pharyngeal Phase Impaired Pharyngeal- Pudding Teaspoon -- Pharyngeal -- Pharyngeal- Pudding Cup -- Pharyngeal -- Pharyngeal- Honey Teaspoon -- Pharyngeal -- Pharyngeal- Honey Cup -- Pharyngeal -- Pharyngeal- Nectar Teaspoon Penetration/Aspiration before swallow;Penetration/Aspiration during swallow;Penetration/Apiration after swallow;Trace aspiration;Pharyngeal residue - valleculae;Pharyngeal residue - pyriform;Pharyngeal residue - posterior pharnyx;Reduced laryngeal elevation;Reduced airway/laryngeal closure;Reduced anterior laryngeal mobility;Reduced epiglottic inversion Pharyngeal Material enters airway, remains ABOVE vocal cords and not ejected out;Material enters airway, CONTACTS cords and not ejected out;Material enters airway, passes BELOW cords and not ejected out despite cough attempt by patient Pharyngeal- Nectar Cup Penetration/Aspiration before swallow;Penetration/Aspiration during swallow;Penetration/Apiration after swallow;Trace aspiration;Pharyngeal residue - valleculae;Pharyngeal residue - pyriform;Pharyngeal residue - posterior pharnyx;Reduced laryngeal elevation;Reduced airway/laryngeal closure;Reduced anterior laryngeal mobility;Reduced epiglottic inversion;Moderate aspiration Pharyngeal Material enters airway, remains ABOVE vocal cords and not ejected out;Material enters airway, CONTACTS cords and not ejected out;Material enters airway, passes BELOW cords and not ejected out despite cough attempt by patient Pharyngeal- Nectar Straw -- Pharyngeal -- Pharyngeal- Thin Teaspoon Penetration/Aspiration before swallow;Penetration/Aspiration during swallow;Penetration/Apiration after swallow;Trace aspiration;Pharyngeal residue - valleculae;Pharyngeal residue - pyriform;Pharyngeal residue - posterior pharnyx;Reduced laryngeal elevation;Reduced airway/laryngeal closure;Reduced anterior laryngeal  mobility;Reduced epiglottic inversion Pharyngeal Material enters airway, remains ABOVE vocal cords and not ejected out;Material enters airway, CONTACTS cords and not ejected out;Material enters airway, passes BELOW cords and not ejected out despite cough attempt by patient Pharyngeal- Thin Cup -- Pharyngeal -- Pharyngeal- Thin Straw -- Pharyngeal -- Pharyngeal- Puree -- Pharyngeal -- Pharyngeal- Mechanical Soft -- Pharyngeal -- Pharyngeal- Regular -- Pharyngeal -- Pharyngeal- Multi-consistency -- Pharyngeal -- Pharyngeal- Pill -- Pharyngeal -- Pharyngeal Comment --  Shanika I. Vear Clock, MS, CCC-SLP Acute Rehabilitation Services Office number [phone] Pager [phone] Scheryl Marten 10/26/2020, 3:36 PM                   Scheduled Meds:  buPROPion  100 mg Per Tube TID   Chlorhexidine Gluconate Cloth  6 each Topical Daily   enoxaparin (LOVENOX) injection  40 mg Subcutaneous Daily   famotidine  20 mg Per Tube BID   feeding supplement (PROSource TF)  45 mL Per Tube BID   FLUoxetine  40 mg Per Tube Daily   free water  200 mL Per Tube Q4H   insulin aspart  0-15 Units Subcutaneous Q4H   insulin aspart  7 Units Subcutaneous Q4H   insulin glargine-yfgn  45 Units Subcutaneous Daily   LORazepam  1 mg Intravenous q1600   LORazepam  2 mg Intravenous BID   losartan  50 mg Per Tube Daily   metoprolol tartrate  12.5 mg Per Tube Q6H   multivitamin with minerals  1 tablet Per Tube Daily   OLANZapine  5 mg Per Tube QHS   polyethylene glycol  17 g Per Tube Daily   rosuvastatin  20 mg Per Tube QHS   senna-docusate  1 tablet Per Tube BID   valproic acid  500 mg Per Tube TID   Continuous Infusions:  feeding supplement (JEVITY 1.5 CAL/FIBER) 1,000 mL (10/26/20 1610)     LOS: 8 days   Time spent: 28 minutes.   Hughie Closs, MD [facility]  If 7PM-7AM, please contact night-coverage [email or web address]  10/27/2020,  11:22 AM

## 2020-10-27 NOTE — Consult Note (Addendum)
[facility] New Psychiatric Evaluation   Service Date: October 27, 2020 LOS:  LOS: 8 days    Assessment  [facility] is a 55 y.o. male admitted medically for 10/19/2020 12:55 PM for aspiration pneumonia after a spinal procedure. He carries the psychiatric diagnoses of schizoaffective disorder vs bipolar disorder (per mom), ADHD and anxiety and has a past medical history of  adrenal disease, bipolar disorder, diabetes, hyperlipidemia, sleep apnea and spinal stenosis. Psychiatry was consulted for suicidal statments by Edward Barefoot, MD.   His current presentation of waxing and waning levels of attention and orientation is most consistent with delirium. He does carry the diagnosis of bipolar disorder vs schizoaffective disorder , and has a history of pill seeking behavior. Current outpatient psychotropic medications include alprazolam, adderall, buproprion, depakote, prozac, prazosin, vyvanse, zolpidem and historically he has had a poor response to these medications; family has significant concern for overall decompensation over past 2-3 years and overmedication/pill-seeking behavior.   He was compliant with medications prior to admission. On initial examination, patient was unable to engage in a nuanced discussion of medications due to his delirium. Overall we are working towards minimizing deliriogenic meds. Please see plan below for detailed recommendations.   10/11: pt became hyperfocused on short acting xanax not covering anxiety (gets 3 mg ER in AM at home). Not amenable to discussion that he is getting IR TID, eventually agreeable to transition to ativan at roughly the BZD equivalent dose. Balancing respiratory suppression with anxiety difficult in this pt on chronic BZD, stimulant, Z drug and opioids.   10/12: pt seems more delirious, many nonsensical responses to questions. Called steroids "suicide pills" and mom confirmed these have caused psychiatric destabilization  in past. Pt assented and mom consented to olanzapine while on steroids. Specifically discussed risk of worsening tardive dyskinesia; feel olanzapine better agent than depakote in setting of significant malnutrition and hypolabuminemia. Did not discuss BZD today - no intention to further taper until later this week, discussion significantly agitated pt yesterday, pt not currently able to have full r/b/se discussion.   10/13: marginally improved. Continues to make suicidal statements which seem provocative in nature and not in line with pt's overall reported mood - continues to have residual delirium and occasionally pulls at lines, NG tube, etc - will continue sitter for now  10/14: continues with delirium, able to joke some today. A little less oriented. Denied SI today.   Diagnoses:  Active Hospital problems: Active Problems:   Hypoxemia   Acute respiratory failure with hypoxia (HCC)   Malnutrition of moderate degree   Delirium due to another medical condition   Polypharmacy   Dysphagia    Problems edited/added by me: No problems updated.  Plan  ## Safety and Observation Level:  - Based on my clinical evaluation, I estimate the patient to be at moderate  risk of self harm in the current setting from pulling at lines, tubes, etc - continue sitter (no longer recommending for SI, can be d/c over weekend if delirium clears)  ## Medications:  --Standing: CONTINUE:   - buproprion 100 mg TID  - valproic acid 500 mg TID  - fluoxetine 40 mg qD - lorazepam 2 mg/1 mg/2 mg (home dose alprazolam 3 mg/d), roughly 15% reduction overall,   - plan to decrease lorazepam to 1/1/2 on Monday   CONTINUE olanzapine 5 QHS and 2.5 BID PRN agitation.   Continue to hold:   - zolpidem, home stimulants   We are generally  making medication changes to reduce sedative burden in this patient (reducing xanax, stopping ambien).   ## Medical Decision Making Capacity:  Not specifically assessed  ##  Further Work-up:  -- to be seen by neuro  ## Disposition:  --  no psychiatric contraindications to discharge at this time  ## Behavioral / Environmental:  -- ordering delirium precautions   Thank you for this consult request. Recommendations have been communicated to the primary team.  We will continue to follow at this time.   [facility]    NEW   Relevant Aspects of Hospital Course:  Admitted on 10/19/2020 for AMS and was found to have aspiration pneumonia.  Patient Report:  Patient seen in early afternoon. Continues to endorse improving pain, mood, etc.Today seemed a little more confused - oriented to self and year. [name] Answers most questions appropriately although becomes tangential when allowed to speak for any amount of time (today discussing marriage). Denied suicidal thoughts today which previously had been prominent/provocative in nature.   ROS:  Unable to complete due to level of delirium.   Collateral information:  Mom at bedside endorsed overall improvement.   Psychiatric History:  Information collected from pt, mother  Medical History: Past Medical History:  Diagnosis Date   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HCC)    Bipolar disorder (HCC)    Complication of anesthesia    Diabetes mellitus without complication (HCC)    Hyperlipidemia    Hypertension    Kidney disease    PONV (postoperative nausea and vomiting)    Sleep apnea    Sleep apnea, unspecified 10/19/2018   Spinal stenosis     Surgical History: Past Surgical History:  Procedure Laterality Date   ANTERIOR CERVICAL DECOMP/DISCECTOMY FUSION N/A 10/16/2020   Procedure: ACDF - C3-C4 - C4-C5 - C5-C6;  Surgeon: Edward Alert, MD;  Location: [facility] OR;  Service: Neurosurgery;  Laterality: N/A;   TONSILLECTOMY      Medications:   Current Facility-Administered Medications:    buPROPion (WELLBUTRIN) tablet 100 mg, 100 mg, Per Tube, TID, Edward Cutter, MD, 100 mg at 10/27/20 1624   docusate (COLACE) 50 MG/5ML  liquid 100 mg, 100 mg, Per Tube, BID PRN, Edward Cutter, MD   enoxaparin (LOVENOX) injection 40 mg, 40 mg, Subcutaneous, Daily, [facility], Ravi, MD, 40 mg at 10/27/20 1031   famotidine (PEPCID) tablet 20 mg, 20 mg, Per Tube, BID, Edward Cutter, MD, 20 mg at 10/27/20 1024   feeding supplement (JEVITY 1.5 CAL/FIBER) liquid 1,000 mL, 1,000 mL, Per Tube, Continuous, Edward Cutter, MD, Last Rate: 65 mL/hr at 10/27/20 1219, 1,000 mL at 10/27/20 1219   feeding supplement (PROSource TF) liquid 45 mL, 45 mL, Per Tube, BID, Edward Cutter, MD, 45 mL at 10/27/20 1023   FLUoxetine (PROZAC) 20 MG/5ML solution 40 mg, 40 mg, Per Tube, Daily, Edward Cutter, MD, 40 mg at 10/27/20 1024   free water 200 mL, 200 mL, Per Tube, Q4H, Regalado, Belkys A, MD, 200 mL at 10/27/20 1625   hydrALAZINE (APRESOLINE) injection 10 mg, 10 mg, Intravenous, Q4H PRN, Tobey Grim, NP   HYDROmorphone (DILAUDID) injection 0.5 mg, 0.5 mg, Intravenous, Q4H PRN, Regalado, Belkys A, MD, 0.5 mg at 10/26/20 1743   insulin aspart (novoLOG) injection 0-15 Units, 0-15 Units, Subcutaneous, Q4H, Tobey Grim, NP, 2 Units at 10/27/20 1625   insulin aspart (novoLOG) injection 7 Units, 7 Units, Subcutaneous, Q4H, Whiteheart, Kathryn A, NP, 7 Units at 10/27/20 1624   insulin glargine-yfgn (  SEMGLEE) injection 45 Units, 45 Units, Subcutaneous, Daily, [facility], Ravi, MD, 45 Units at 10/27/20 1024   ipratropium-albuterol (DUONEB) 0.5-2.5 (3) MG/3ML nebulizer solution 3 mL, 3 mL, Nebulization, Q6H PRN, Mikey College T, MD   labetalol (NORMODYNE) injection 10 mg, 10 mg, Intravenous, Q2H PRN, Janyth Contes, Katalina M, NP   lip balm (CARMEX) ointment, , Topical, PRN, Rica Mote, MD   LORazepam (ATIVAN) injection 1 mg, 1 mg, Intravenous, q1600, Reisha Wos A, 1 mg at 10/27/20 1624   LORazepam (ATIVAN) injection 2 mg, 2 mg, Intravenous, BID, Jaella Weinert A, 2 mg at 10/27/20 1021   metoprolol tartrate (LOPRESSOR)  tablet 12.5 mg, 12.5 mg, Per Tube, Q6H, Regalado, Belkys A, MD, 12.5 mg at 10/27/20 1030   multivitamin with minerals tablet 1 tablet, 1 tablet, Per Tube, Daily, Edward Cutter, MD, 1 tablet at 10/27/20 1025   OLANZapine (ZYPREXA) tablet 2.5 mg, 2.5 mg, Per Tube, BID PRN, Marlee Trentman A, 2.5 mg at 10/26/20 1801   OLANZapine (ZYPREXA) tablet 5 mg, 5 mg, Per Tube, QHS, Atalie Oros A, 5 mg at 10/27/20 0031   ondansetron (ZOFRAN) injection 4 mg, 4 mg, Intravenous, Q6H PRN, Mikey College T, MD   oxyCODONE-acetaminophen (PERCOCET/ROXICET) 5-325 MG per tablet 1-2 tablet, 1-2 tablet, Per Tube, Q6H PRN, Regalado, Belkys A, MD, 2 tablet at 10/26/20 2159   polyethylene glycol (MIRALAX / GLYCOLAX) packet 17 g, 17 g, Per Tube, Daily PRN, Edward Cutter, MD, 17 g at 10/25/20 0950   polyethylene glycol (MIRALAX / GLYCOLAX) packet 17 g, 17 g, Per Tube, Daily, Regalado, Belkys A, MD, 17 g at 10/27/20 1023   rosuvastatin (CRESTOR) tablet 20 mg, 20 mg, Per Tube, QHS, Edward Cutter, MD, 20 mg at 10/26/20 2202   senna-docusate (Senokot-S) tablet 1 tablet, 1 tablet, Per Tube, BID, Regalado, Belkys A, MD, 1 tablet at 10/27/20 1024   valproic acid (DEPAKENE) 250 MG/5ML solution 500 mg, 500 mg, Per Tube, TID, Edward Cutter, MD, 500 mg at 10/27/20 1623  Allergies: Allergies  Allergen Reactions   Gramineae Pollens     Social History:  See HPI  Family History:  The patient's family history includes Bipolar disorder in his father; Cancer in his father; Diabetes in his father; Hyperlipidemia in his father; Hypertension in his father; Non-Hodgkin's lymphoma in his mother; Thyroid disease in his father.    Objective  Vital signs:  Temp:  [97.7 F (36.5 C)-98.9 F (37.2 C)] 98.9 F (37.2 C) (10/14 1537) Pulse Rate:  [79-94] 94 (10/14 1537) Resp:  [19-25] 20 (10/14 1537) BP: (91-136)/(43-66) 92/61 (10/14 1537) SpO2:  [88 %-96 %] 96 % (10/14 1537) Weight:  [70.8 kg] 70.8 kg (10/14  0443)  Physical Exam: Gen: Normocephalic/atraumatic Pulm: on [place], occasionally pulls at it and NG tube but less than yesterday, responsive to redirection Neuro: CNII-XII grossly intact Psych: disoriented, worse than yesterday   Mental Status Exam: Appearance: Ill appearing and pale  Attitude:  Cooperative, less demanding  Behavior/Psychomotor: Psychomotor restlessness, pulling at [place]. Abnormal orofacial movements noted again today but no worse after start of olanzapine (actually slightly improved)  Speech/Language:  Normal rate, volume  Mood: Better  Affect: Congruent   Thought process: Responds appropriately to direct questions, tangential if allowed to speak >30s  Thought content:   No SI endorsed today.   Perceptual disturbances:  Not endorsed, not overtly RIS  Attention: fluctuating  Concentration: poor  Orientation: Self, situation, month (not year0  Memory: poor  Fund of knowledge:  Did not formally assess. Vocabulary quite advanced.   Insight:   poor  Judgment:  poor  Impulse Control: poor     I personally spent 25 minutes on the unit in direct patient care. The direct patient care time included face-to-face time with the patient, reviewing the patient's chart, communicating with other professionals, and coordinating care. Greater than 50% of this time was spent in counseling or coordinating care with the patient regarding goals of hospitalization, psycho-education, and discharge planning needs.

## 2020-10-27 NOTE — Progress Notes (Signed)
Occupational Therapy Treatment Patient Details Name: Edward Garner MRN: [id number] DOB: [birth year] Today's Date: 10/27/2020   History of present illness Pt is a 55 y.o. male admitted 10/19/20 with AMS, lethargy, hypoxia; workup for multifocal PNA. Head CT negative for acute abnormality. Of note, recent admission 10/3-10/4/22 for C3-6 ACDF; brief ER visit 10/4 for syncope. Other PMH includes DM, HTN, HLD, OSA, seizure, schizoaffective disorder.   OT comments  Patient seated in recliner upon entry.  Patient remains disoriented to place and time, requires constant redirection to task and presents with poor awareness and problem solving.  Reports fatigued during LB dressing tasks, requires mod assist to don socks with max multimodal cueing.  Left lateral lean during dynamic sitting balance. Updated dc plan to CIR, will follow acutely.    Recommendations for follow up therapy are one component of a multi-disciplinary discharge planning process, led by the attending physician.  Recommendations may be updated based on patient status, additional functional criteria and insurance authorization.    Follow Up Recommendations  Supervision/Assistance - 24 hour;CIR    Equipment Recommendations  Other (comment) (TBD)    Recommendations for Other Services      Precautions / Restrictions Precautions Precautions: Cervical;Fall;Other (comment) Precaution Comments: Cortrak, watch vitals Required Braces or Orthoses: Cervical Brace Cervical Brace: Soft collar;At all times Restrictions Weight Bearing Restrictions: No       Mobility Bed Mobility Overal bed mobility: Needs Assistance Bed Mobility: Supine to Sit Rolling: Min assist Sidelying to sit: Min assist;HOB elevated       General bed mobility comments: OOB upon entry    Transfers Overall transfer level: Needs assistance   Transfers: Sit to/from Stand Sit to Stand: Min assist         General transfer comment: min assis to rise,  cues for hand placement and safety with assist for balance with RW present    Balance Overall balance assessment: Needs assistance   Sitting balance-Leahy Scale: Poor Sitting balance - Comments: min guard dynamically for ADLs, left lateral lean and cueing upt to min assist for midline Postural control: Left lateral lean Standing balance support: Bilateral upper extremity supported;During functional activity Standing balance-Leahy Scale: Poor Standing balance comment: posterior left lean with bil UE support on RW and physical assist to maintain balance                           ADL either performed or assessed with clinical judgement   ADL Overall ADL's : Needs assistance/impaired                     Lower Body Dressing: Moderate assistance;Sitting/lateral leans Lower Body Dressing Details (indicate cue type and reason): focus on socks, min guard to doff and mod assist to don with max cueing for attention, sequencing and problem solving with greatly increased time               General ADL Comments: pt remains limited by weakness, activity tolerance and cognition     Vision       Perception     Praxis      Cognition Arousal/Alertness: Awake/alert Behavior During Therapy: Impulsive Overall Cognitive Status: Impaired/Different from baseline Area of Impairment: Orientation;Attention;Memory;Following commands;Safety/judgement;Awareness;Problem solving                 Orientation Level: Disoriented to;Place;Time Current Attention Level: Focused Memory: Decreased short-term memory;Decreased recall of precautions Following Commands: Follows one step commands consistently;Follows one  step commands with increased time Safety/Judgement: Decreased awareness of safety;Decreased awareness of deficits Awareness: Intellectual Problem Solving: Slow processing;Decreased initiation;Difficulty sequencing;Requires verbal cues;Requires tactile cues General  Comments: pt remains disoriented to place and time, requires max cueing for redirection and poor problem solving.  reports fatigued.        Exercises     Shoulder Instructions       General Comments mother present, pt on 4L O2 during session with VSS (soft BP)    Pertinent Vitals/ Pain       Pain Assessment: Faces Pain Score: 3  Faces Pain Scale: Hurts little more Pain Location: neck Pain Descriptors / Indicators: Aching Pain Intervention(s): Limited activity within patient's tolerance;Monitored during session;Repositioned  Home Living                                          Prior Functioning/Environment              Frequency  Min 2X/week        Progress Toward Goals  OT Goals(current goals can now be found in the care plan section)  Progress towards OT goals: Progressing toward goals  Acute Rehab OT Goals Patient Stated Goal: get something to drink OT Goal Formulation: With patient  Plan Frequency remains appropriate;Discharge plan needs to be updated    Co-evaluation                 AM-PAC OT "6 Clicks" Daily Activity     Outcome Measure   Help from another person eating meals?: Total Help from another person taking care of personal grooming?: A Little Help from another person toileting, which includes using toliet, bedpan, or urinal?: A Lot Help from another person bathing (including washing, rinsing, drying)?: A Lot Help from another person to put on and taking off regular upper body clothing?: A Little Help from another person to put on and taking off regular lower body clothing?: A Lot 6 Click Score: 13    End of Session Equipment Utilized During Treatment: Oxygen  OT Visit Diagnosis: Unsteadiness on feet (R26.81);Other abnormalities of gait and mobility (R26.89);Muscle weakness (generalized) (M62.81);Other symptoms and signs involving cognitive function   Activity Tolerance Patient tolerated treatment well   Patient  Left in chair;with call bell/phone within reach;with chair alarm set;with nursing/sitter in room;with family/visitor present   Nurse Communication Mobility status        Time: 6962-9528 OT Time Calculation (min): 22 min  Charges: OT General Charges $OT Visit: 1 Visit OT Treatments $Self Care/Home Management : 8-22 mins  Barry Brunner, OT Acute Rehabilitation Services Pager [phone] Office [phone]   Chancy Milroy 10/27/2020, 1:25 PM

## 2020-10-28 DIAGNOSIS — J9601 Acute respiratory failure with hypoxia: Secondary | ICD-10-CM | POA: Diagnosis not present

## 2020-10-28 LAB — GLUCOSE, CAPILLARY
Glucose-Capillary: 148 mg/dL — ABNORMAL HIGH (ref 70–99)
Glucose-Capillary: 156 mg/dL — ABNORMAL HIGH (ref 70–99)
Glucose-Capillary: 201 mg/dL — ABNORMAL HIGH (ref 70–99)
Glucose-Capillary: 217 mg/dL — ABNORMAL HIGH (ref 70–99)
Glucose-Capillary: 89 mg/dL (ref 70–99)

## 2020-10-28 NOTE — Progress Notes (Signed)
Providing Compassionate, Quality Care - Together   Subjective: Patient with sitter and mother at the bedside. No new issues reported. Patient still with delirium.  Objective: Vital signs in last 24 hours: Temp:  [98 F (36.7 C)-98.9 F (37.2 C)] 98.5 F (36.9 C) (10/15 0801) Pulse Rate:  [82-98] 83 (10/15 0801) Resp:  [16-22] 17 (10/15 0801) BP: (91-131)/(52-80) 111/80 (10/15 0801) SpO2:  [88 %-98 %] 98 % (10/15 0801) Weight:  [70.8 kg] 70.8 kg (10/15 0338)  Intake/Output from previous day: 10/14 0701 - 10/15 0700 In: 1115 [NG/GT:1115] Out: 862 [Urine:800; Emesis/NG output:60; Stool:2] Intake/Output this shift: No intake/output data recorded.  Alert and confused, oriented to self PERRLA CN II-XII grossly intact Lungs rhonchus Nasal cannula Cortrak in place; infusing tube feed MAE, Strength and sensation intact Incision is open to air. Clean, dry, and intact; mild swelling   Lab Results: Recent Labs    10/26/20 0239 10/27/20 0402  WBC 13.7* 15.3*  HGB 11.2* 10.7*  HCT 34.0* 32.4*  PLT 365 338   BMET Recent Labs    10/26/20 0239 10/27/20 0402  NA 146* 143  K 4.0 3.9  CL 103 103  CO2 37* 32  GLUCOSE 199* 152*  BUN 21* 19  CREATININE 0.85 0.81  CALCIUM 9.2 9.2    Studies/Results: DG CHEST PORT 1 VIEW  Result Date: 10/27/2020 CLINICAL DATA:  Evaluate for pneumonia EXAM: PORTABLE CHEST 1 VIEW COMPARISON:  10/19/2020 FINDINGS: There is a feeding tube with tip well below the level of the GE junction. Stable cardiomediastinal contours. Persistent airspace opacities identified within the right mid and lower lung and retrocardiac left lung base. Bilateral glenohumeral joint osteoarthritis is noted with multiple loose bodies in the left joint space. No acute osseous findings. IMPRESSION: Persistent bilateral pulmonary opacities compatible with multifocal infection. Electronically Signed   By: Signa Kell M.D.   On: 10/27/2020 15:04   DG Swallowing  Func-Speech Pathology  Result Date: 10/26/2020 Table formatting from the original result was not included. Objective Swallowing Evaluation: Type of Study: MBS-Modified Barium Swallow Study  Patient Details Name: Edward Garner MRN: [id number] Date of Birth: 08/17/1965 Today's Date: 10/26/2020 Time: SLP Start Time (ACUTE ONLY): 1320 -SLP Stop Time (ACUTE ONLY): 1332 SLP Time Calculation (min) (ACUTE ONLY): 12 min Past Medical History: Past Medical History: Diagnosis Date  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HCC)   Bipolar disorder (HCC)   Complication of anesthesia   Diabetes mellitus without complication (HCC)   Hyperlipidemia   Hypertension   Kidney disease   PONV (postoperative nausea and vomiting)   Sleep apnea   Sleep apnea, unspecified 10/19/2018  Spinal stenosis  Past Surgical History: Past Surgical History: Procedure Laterality Date  ANTERIOR CERVICAL DECOMP/DISCECTOMY FUSION N/A 10/16/2020  Procedure: ACDF - C3-C4 - C4-C5 - C5-C6;  Surgeon: Tia Alert, MD;  [facility] OR;  Service: Neurosurgery;  Laterality: N/A;  TONSILLECTOMY   HPI: Pt is a 54 y/o male who recently underwent cervical ACDF C3-C6 on 10/3 and discharged home on 10/4.  Patient presented to ER later that day after syncopal episode at home with negative CTH and cervical CT and required scalp laceration repair.  No other complaints, normal ddimer and hemodynamics and therefore discharged home. Pt returned to tje ER again for progressive lethargy at home and was found by EMS with O2 saturations in the 70's, placed on NRB and no improvement to narcan.  Additionally reported decrease oral intake due to ongoing pain and some difficulty swallowing, CT cervical showed  slightly increased prevertebral swelling compared to 10/4 and narrowed AP diameter of the larynx the level of the epiglottis to 5 mm, CTH negative for acute intracranial process, and CTA PE was negative for PE, but showed multifocal pneumonia and thickening of esophageal wall possibly reflecting  esophagitis, and hepatomegaly.  No data recorded Assessment / Plan / Recommendation CHL IP CLINICAL IMPRESSIONS 10/26/2020 Clinical Impression Pt was alert, but demonstrated difficulty attending to tasks and his implementation of compensatory strategies was variable; the impact of these on his performance is considered. Pt s/p ACDF 10/3 and hardward noted C3-C-6 with the prevetebral edema as noted on CT soft tissue. He presented with oropharyngeal dysphagia characterized by reduced bolus cohesion, reduced pharyngeal constriction, a pharyngeal delay, reduced hyolaryngeal elavation and reduced anterior laryngeal movement. Some hyolaryngeal elevation was noted but anterior laryngeal movement was reduced and epiglottic inversion was limited, at least partly, due to edema. He was able to swallow very small portions of boluses with repeated swallows, but duration of cricopharyngeal relaxation was reduced. Moderate vallecular residue and pyriform sinus residue were  noted accross limited trials. Penetration (PAS 3, 5) was noted before deglutition and aspiration (PAS 7,8) noted during and after. Aspiration frequently resulted in coughing which was inconsistently effective propelling the aspirate superior to the vocal folds, but it was often ineffective in fully expelling penetrate/aspirate from the larynx, and a recurrence of aspiration was therefore demonstrated. With a cued effortful swallow and nectar thick liquids via cup, pt was once able to demonstrate improved laryngeal vestibule closure and increased airway protection. This could not be replicated, but this reduced the amount of laryngeal invasion and resulted in there only being laryngeal invasion after the swallow. Pt is at risk for aspiration before, during and after deglutition due to the pharyngeal delay, premature spillage, reduced airway protection, and pharyngeal residue. It is recommended that the NPO status be maitained with continued allowance of ice  chips following oral care. SLP will continue to follow pt for treatment, but is anticipated that improvement in the pharyngeal edema will facilitate some increased epiglottic inversion and reduce aspiration risk. SLP Visit Diagnosis Dysphagia, oropharyngeal phase (R13.12) Attention and concentration deficit following -- Frontal lobe and executive function deficit following -- Impact on safety and function Moderate aspiration risk   CHL IP TREATMENT RECOMMENDATION 10/26/2020 Treatment Recommendations Therapy as outlined in treatment plan below   Prognosis 10/26/2020 Prognosis for Safe Diet Advancement Good Barriers to Reach Goals Severity of deficits Barriers/Prognosis Comment -- CHL IP DIET RECOMMENDATION 10/26/2020 SLP Diet Recommendations NPO;Ice chips PRN after oral care Liquid Administration via -- Medication Administration Via alternative means Compensations -- Postural Changes Seated upright at 90 degrees   CHL IP OTHER RECOMMENDATIONS 10/26/2020 Recommended Consults -- Oral Care Recommendations Oral care QID Other Recommendations --   CHL IP FOLLOW UP RECOMMENDATIONS 10/26/2020 Follow up Recommendations None   CHL IP FREQUENCY AND DURATION 10/26/2020 Speech Therapy Frequency (ACUTE ONLY) min 2x/week Treatment Duration 2 weeks      CHL IP ORAL PHASE 10/26/2020 Oral Phase Impaired Oral - Pudding Teaspoon -- Oral - Pudding Cup -- Oral - Honey Teaspoon -- Oral - Honey Cup -- Oral - Nectar Teaspoon Decreased bolus cohesion;Premature spillage Oral - Nectar Cup Decreased bolus cohesion;Premature spillage Oral - Nectar Straw -- Oral - Thin Teaspoon Decreased bolus cohesion;Premature spillage Oral - Thin Cup -- Oral - Thin Straw -- Oral - Puree -- Oral - Mech Soft -- Oral - Regular -- Oral - Multi-Consistency -- Oral - Pill --  Oral Phase - Comment --  CHL IP PHARYNGEAL PHASE 10/26/2020 Pharyngeal Phase Impaired Pharyngeal- Pudding Teaspoon -- Pharyngeal -- Pharyngeal- Pudding Cup -- Pharyngeal -- Pharyngeal- Honey  Teaspoon -- Pharyngeal -- Pharyngeal- Honey Cup -- Pharyngeal -- Pharyngeal- Nectar Teaspoon Penetration/Aspiration before swallow;Penetration/Aspiration during swallow;Penetration/Apiration after swallow;Trace aspiration;Pharyngeal residue - valleculae;Pharyngeal residue - pyriform;Pharyngeal residue - posterior pharnyx;Reduced laryngeal elevation;Reduced airway/laryngeal closure;Reduced anterior laryngeal mobility;Reduced epiglottic inversion Pharyngeal Material enters airway, remains ABOVE vocal cords and not ejected out;Material enters airway, CONTACTS cords and not ejected out;Material enters airway, passes BELOW cords and not ejected out despite cough attempt by patient Pharyngeal- Nectar Cup Penetration/Aspiration before swallow;Penetration/Aspiration during swallow;Penetration/Apiration after swallow;Trace aspiration;Pharyngeal residue - valleculae;Pharyngeal residue - pyriform;Pharyngeal residue - posterior pharnyx;Reduced laryngeal elevation;Reduced airway/laryngeal closure;Reduced anterior laryngeal mobility;Reduced epiglottic inversion;Moderate aspiration Pharyngeal Material enters airway, remains ABOVE vocal cords and not ejected out;Material enters airway, CONTACTS cords and not ejected out;Material enters airway, passes BELOW cords and not ejected out despite cough attempt by patient Pharyngeal- Nectar Straw -- Pharyngeal -- Pharyngeal- Thin Teaspoon Penetration/Aspiration before swallow;Penetration/Aspiration during swallow;Penetration/Apiration after swallow;Trace aspiration;Pharyngeal residue - valleculae;Pharyngeal residue - pyriform;Pharyngeal residue - posterior pharnyx;Reduced laryngeal elevation;Reduced airway/laryngeal closure;Reduced anterior laryngeal mobility;Reduced epiglottic inversion Pharyngeal Material enters airway, remains ABOVE vocal cords and not ejected out;Material enters airway, CONTACTS cords and not ejected out;Material enters airway, passes BELOW cords and not ejected out  despite cough attempt by patient Pharyngeal- Thin Cup -- Pharyngeal -- Pharyngeal- Thin Straw -- Pharyngeal -- Pharyngeal- Puree -- Pharyngeal -- Pharyngeal- Mechanical Soft -- Pharyngeal -- Pharyngeal- Regular -- Pharyngeal -- Pharyngeal- Multi-consistency -- Pharyngeal -- Pharyngeal- Pill -- Pharyngeal -- Pharyngeal Comment --  Shanika I. Vear Clock, MS, CCC-SLP Acute Rehabilitation Services Office number [phone] Pager [phone] Scheryl Marten 10/26/2020, 3:36 PM               Assessment/Plan: Patient underwent C3-4, C4-5, C5-6 ACDF by Dr. [name] on 10/16/2020. He developed pneumonia postoperatively.   LOS: 9 days   -Continue therapies and mobilize as much as possible -Continue NPO status for now   Val Eagle, DNP, AGNP-C Nurse Practitioner  [facility] [address], [address], [place], [place] [address] P: [phone]    F: [phone]  10/28/2020, 10:05 AM

## 2020-10-28 NOTE — Progress Notes (Signed)
BIPAP not needed at this time pt is resting well.

## 2020-10-28 NOTE — Progress Notes (Signed)
PROGRESS NOTE    Edward Garner  YPP:[id number] DOB: 10-02-1965 DOA: 10/19/2020 PCP: [name], MD   Brief Narrative: 55 year old with past medical history significant for smoker, OSA, hypertension, hyperlipidemia, diabetes type 2, seizure and cervical spondylosis/stenosis status post ACDF on 10/3 presenting from home with confusion.  Patient recently underwent cervical ACDF C3 C6 with Dr. [name] on 10/3 and discharged home on 10/4.  Patient presented to the ER later that day after a syncopal episode at home with negative CT head and cervical CT, requiring scalp laceration repair.  Subsequently was discharged home.  Patient returned to the ER the day of admission due to progressive lethargy and found by EMS with oxygen saturation in the 70s, he was placed on normal breather mask and received Narcan with significant improvement.  Poor oral intake due to ongoing pain and some difficulty with swallowing. Patient was admitted with multifocal pneumonia.  CTA was negative for PE.  CT cervical which showed slightly increased prevertebral swelling compared to 10/4, P diameter of the larynx the level of the epiglottis to 5 mm.  Significant Hospital events: 10/3 cervical ACDF C3-C6 with Dr. [name], discharged home 10/4 10/4 syncope at home, ER eval neg, discharged home 10/6 Admit to PCCM for acute hypoxic respiratory failure 2/2 multifocal pna, on BiPAP 10/7 On 11L [place]  Assessment & Plan:   Active Problems:   Hypoxemia   Acute respiratory failure with hypoxia (HCC)   Malnutrition of moderate degree   Delirium due to another medical condition   Polypharmacy   Dysphagia  1-Acute Hypoxic respiratory failure in the setting of multifocal pneumonia: Concern for aspiration event with prevertebral swelling status post C-spine fusion -History of OSA on CPAP Continue with core track and n.p.o. with speech following Completed 7 days of Unasyn on 10/26/2020. Continue with oxygen supplementation and try  to wean as able to.  Currently still requiring 4 L oxygen and saturating around 95%.  Advised RN to wean oxygen down.  Worsening hypoxemia thought to be related to hypoventilation/atelectasis.  Continue and encouraged incentive spirometry.  Continue pulmonary hygiene.  Repeat chest x-ray on 10/27/2020 shows persistent bilateral pulmonary infiltrates.  2-Dysphagia: CT with slightly increased prevertebral swelling compared to 10/4 Continue with core track and tube feeding. Speech following.  MBS completed which shows oropharyngeal phase dysphagia and moderate aspiration risk.  Remains NPO. Received IV steroids.   Hypernatremia:; Resolved.  Diabetes type 2: Fairly controlled.  Continue Semglee 45 units, NovoLog 7 units every 4 hours and SSI.  Seizure disorder: Continue with Depakote.   Bipolar: Continue with Prozac and Wellbutrin. Holding Lyrica He reporter  to nurse last night he wants to jump out of window.. -psych recommend continue with bupropion valporic acid and Prozac. -hold adderal and Vyvasne.  -Alprazolam change to ativan.  -Psych recommend schedule pain medication, his pain is very well controlled.  With as needed medications.  Patient started on Zyprexa for steroid-induced agitation on 10/25/2020 and psychiatry does not recommend continuing that at the time of discharge.  Hypomagnesemia: Resolved.  Hypertension: Better than yesterday.  Cozaar was discontinued on 10/27/2020 due to low blood pressure.  Metoprolol continued.  Recent C-spine fusion by Dr. [name] 10/4: Neurosurgery following   Nutrition Problem: Moderate Malnutrition Etiology: chronic illness    Signs/Symptoms: moderate fat depletion, moderate muscle depletion    Interventions: MVI, Tube feeding  Estimated body mass index is 21.17 kg/m as calculated from the following:   Height as of this encounter: 6' (1.829 m).  Weight as of this encounter: 70.8 kg.   DVT prophylaxis: Lovenox Code Status: Full  code Family Communication: Mother at bedside Disposition Plan:  Status is: Inpatient  Remains inpatient appropriate because:IV treatments appropriate due to intensity of illness or inability to take PO  Dispo: The patient is from: Home              Anticipated d/c is to: Home              Patient currently is not medically stable to d/c.   Difficult to place patient No   Consultants:  Neurosurgery   Procedures:    Antimicrobials:    Subjective: Patient seen and examined.  He has no complaints.  Fully alert and oriented.  Mother at the bedside.  Objective: Vitals:   10/27/20 2349 10/28/20 0338 10/28/20 0357 10/28/20 0801  BP: 113/61  131/66 111/80  Pulse: 85  82 83  Resp: 16  17 17   Temp: 98 F (36.7 C)  98 F (36.7 C) 98.5 F (36.9 C)  TempSrc: Oral  Axillary Oral  SpO2: 94%  93% 98%  Weight:  70.8 kg    Height:        Intake/Output Summary (Last 24 hours) at 10/28/2020 1052 Last data filed at 10/28/2020 1049 Gross per 24 hour  Intake 785 ml  Output 1586 ml  Net -801 ml    Filed Weights   10/26/20 0500 10/27/20 0443 10/28/20 0338  Weight: 70.9 kg 70.8 kg 70.8 kg    Examination:  General exam: Appears calm and comfortable, cortrak in place Respiratory system: Clear to auscultation. Respiratory effort normal. Cardiovascular system: S1 & S2 heard, RRR. No JVD, murmurs, rubs, gallops or clicks. No pedal edema. Gastrointestinal system: Abdomen is nondistended, soft and nontender. No organomegaly or masses felt. Normal bowel sounds heard. Central nervous system: Alert and oriented x3. No focal neurological deficits. Extremities: Symmetric 5 x 5 power. Skin: No rashes, lesions or ulcers.  Psychiatry: Judgement and insight appear poor   Data Reviewed: I have personally reviewed following labs and imaging studies  CBC: Recent Labs  Lab 10/22/20 0759 10/23/20 0056 10/24/20 0618 10/26/20 0239 10/27/20 0402  WBC 13.8* 11.1* 10.5 13.7* 15.3*  NEUTROABS   --   --   --  10.1* 11.4*  HGB 12.2* 11.7* 11.3* 11.2* 10.7*  HCT 37.0* 36.0* 34.1* 34.0* 32.4*  MCV 98.4 100.3* 96.6 99.1 99.7  PLT 319 294 297 365 338    Basic Metabolic Panel: Recent Labs  Lab 10/21/20 1620 10/22/20 0759 10/22/20 2023 10/23/20 0813 10/23/20 1833 10/24/20 0618 10/26/20 0239 10/27/20 0402  NA  --  159*   < > 149* 145 143 146* 143  K  --  3.6   < > 3.7 4.5 4.5 4.0 3.9  CL  --  120*   < > 108 106 96* 103 103  CO2  --  32   < > 32 32 36* 37* 32  GLUCOSE  --  252*   < > 302* 243* 379* 199* 152*  BUN  --  24*   < > 14 15 24* 21* 19  CREATININE  --  0.78   < > 0.77 0.71 0.80 0.85 0.81  CALCIUM  --  10.0   < > 9.5 9.5 9.8 9.2 9.2  MG 2.2 2.2  --   --   --   --   --   --   PHOS 2.5  --   --   --   --   --   --   --    < > =  values in this interval not displayed.    GFR: Estimated Creatinine Clearance: 105.6 mL/min (by C-G formula based on SCr of 0.81 mg/dL). Liver Function Tests: Recent Labs  Lab 10/24/20 0618  AST 58*  ALT 127*  ALKPHOS 96  BILITOT 0.4  PROT 5.8*  ALBUMIN 2.0*    No results for input(s): LIPASE, AMYLASE in the last 168 hours. Recent Labs  Lab 10/24/20 1031  AMMONIA 22    Coagulation Profile: No results for input(s): INR, PROTIME in the last 168 hours.  Cardiac Enzymes: No results for input(s): CKTOTAL, CKMB, CKMBINDEX, TROPONINI in the last 168 hours. BNP (last 3 results) No results for input(s): PROBNP in the last 8760 hours. HbA1C: No results for input(s): HGBA1C in the last 72 hours. CBG: Recent Labs  Lab 10/27/20 1540 10/27/20 2223 10/27/20 2349 10/28/20 0402 10/28/20 0827  GLUCAP 142* 157* 183* 156* 148*    Lipid Profile: No results for input(s): CHOL, HDL, LDLCALC, TRIG, CHOLHDL, LDLDIRECT in the last 72 hours. Thyroid Function Tests: No results for input(s): TSH, T4TOTAL, FREET4, T3FREE, THYROIDAB in the last 72 hours. Anemia Panel: No results for input(s): VITAMINB12, FOLATE, FERRITIN, TIBC, IRON,  RETICCTPCT in the last 72 hours. Sepsis Labs: Recent Labs  Lab 10/27/20 0402  PROCALCITON 0.26     Recent Results (from the past 240 hour(s))  Urine Culture     Status: None   Collection Time: 10/19/20  1:48 PM   Specimen: Urine, Clean Catch  Result Value Ref Range Status   Specimen Description URINE, CLEAN CATCH  Final   Special Requests NONE  Final   Culture   Final    NO GROWTH Performed at [facility], [address] [place], [place] [address]    Report Status 10/20/2020 FINAL  Final  Resp Panel by RT-PCR (Flu A&B, Covid) Nasopharyngeal Swab     Status: None   Collection Time: 10/19/20  2:31 PM   Specimen: Nasopharyngeal Swab; Nasopharyngeal(NP) swabs in vial transport medium  Result Value Ref Range Status   SARS Coronavirus 2 by RT PCR NEGATIVE NEGATIVE Final    Comment: (NOTE) SARS-CoV-2 target nucleic acids are NOT DETECTED.  The SARS-CoV-2 RNA is generally detectable in upper respiratory specimens during the acute phase of infection. The lowest concentration of SARS-CoV-2 viral copies this assay can detect is 138 copies/mL. A negative result does not preclude SARS-Cov-2 infection and should not be used as the sole basis for treatment or other patient management decisions. A negative result may occur with  improper specimen collection/handling, submission of specimen other than nasopharyngeal swab, presence of viral mutation(s) within the areas targeted by this assay, and inadequate number of viral copies(<138 copies/mL). A negative result must be combined with clinical observations, patient history, and epidemiological information. The expected result is Negative.  Fact Sheet for Patients:  BloggerCourse.com  Fact Sheet for Healthcare Providers:  SeriousBroker.it  This test is no t yet approved or cleared by the [place] FDA and  has been authorized for detection and/or diagnosis of SARS-CoV-2  by FDA under an Emergency Use Authorization (EUA). This EUA will remain  in effect (meaning this test can be used) for the duration of the COVID-19 declaration under Section 564(b)(1) of the Act, 21 U.S.C.section 360bbb-3(b)(1), unless the authorization is terminated  or revoked sooner.       Influenza A by PCR NEGATIVE NEGATIVE Final   Influenza B by PCR NEGATIVE NEGATIVE Final    Comment: (NOTE) The Xpert Xpress SARS-CoV-2/FLU/RSV  plus assay is intended as an aid in the diagnosis of influenza from Nasopharyngeal swab specimens and should not be used as a sole basis for treatment. Nasal washings and aspirates are unacceptable for Xpert Xpress SARS-CoV-2/FLU/RSV testing.  Fact Sheet for Patients: BloggerCourse.com  Fact Sheet for Healthcare Providers: SeriousBroker.it  This test is not yet approved or cleared by the [place] FDA and has been authorized for detection and/or diagnosis of SARS-CoV-2 by FDA under an Emergency Use Authorization (EUA). This EUA will remain in effect (meaning this test can be used) for the duration of the COVID-19 declaration under Section 564(b)(1) of the Act, 21 U.S.C. section 360bbb-3(b)(1), unless the authorization is terminated or revoked.  Performed at [facility], [address], [place], [place] [address]   Culture, blood (single)     Status: None   Collection Time: 10/19/20  2:38 PM   Specimen: BLOOD  Result Value Ref Range Status   Specimen Description BLOOD SITE NOT SPECIFIED  Final   Special Requests   Final    BOTTLES DRAWN AEROBIC ONLY Blood Culture results may not be optimal due to an inadequate volume of blood received in culture bottles   Culture   Final    NO GROWTH 5 DAYS Performed at [facility], [address] [place], [place] [address]    Report Status 10/24/2020 FINAL  Final          Radiology Studies: DG CHEST PORT 1 VIEW  Result Date:  10/27/2020 CLINICAL DATA:  Evaluate for pneumonia EXAM: PORTABLE CHEST 1 VIEW COMPARISON:  10/19/2020 FINDINGS: There is a feeding tube with tip well below the level of the GE junction. Stable cardiomediastinal contours. Persistent airspace opacities identified within the right mid and lower lung and retrocardiac left lung base. Bilateral glenohumeral joint osteoarthritis is noted with multiple loose bodies in the left joint space. No acute osseous findings. IMPRESSION: Persistent bilateral pulmonary opacities compatible with multifocal infection. Electronically Signed   By: Signa Kell M.D.   On: 10/27/2020 15:04   DG Swallowing Func-Speech Pathology  Result Date: 10/26/2020 Table formatting from the original result was not included. Objective Swallowing Evaluation: Type of Study: MBS-Modified Barium Swallow Study  Patient Details Name: Adyan Palau MRN: [id number] Date of Birth: 08/30/1965 Today's Date: 10/26/2020 Time: SLP Start Time (ACUTE ONLY): 1320 -SLP Stop Time (ACUTE ONLY): 1332 SLP Time Calculation (min) (ACUTE ONLY): 12 min Past Medical History: Past Medical History: Diagnosis Date  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HCC)   Bipolar disorder (HCC)   Complication of anesthesia   Diabetes mellitus without complication (HCC)   Hyperlipidemia   Hypertension   Kidney disease   PONV (postoperative nausea and vomiting)   Sleep apnea   Sleep apnea, unspecified 10/19/2018  Spinal stenosis  Past Surgical History: Past Surgical History: Procedure Laterality Date  ANTERIOR CERVICAL DECOMP/DISCECTOMY FUSION N/A 10/16/2020  Procedure: ACDF - C3-C4 - C4-C5 - C5-C6;  Surgeon: Tia Alert, MD;  Location: [facility] OR;  Service: Neurosurgery;  Laterality: N/A;  TONSILLECTOMY   HPI: Pt is a 55 y/o male who recently underwent cervical ACDF C3-C6 on 10/3 and discharged home on 10/4.  Patient presented to ER later that day after syncopal episode at home with negative CTH and cervical CT and required scalp laceration repair.  No other  complaints, normal ddimer and hemodynamics and therefore discharged home. Pt returned to tje ER again for progressive lethargy at home and was found by EMS with O2 saturations in the  70's, placed on NRB and no improvement to narcan.  Additionally reported decrease oral intake due to ongoing pain and some difficulty swallowing, CT cervical showed slightly increased prevertebral swelling compared to 10/4 and narrowed AP diameter of the larynx the level of the epiglottis to 5 mm, CTH negative for acute intracranial process, and CTA PE was negative for PE, but showed multifocal pneumonia and thickening of esophageal wall possibly reflecting esophagitis, and hepatomegaly.  No data recorded Assessment / Plan / Recommendation CHL IP CLINICAL IMPRESSIONS 10/26/2020 Clinical Impression Pt was alert, but demonstrated difficulty attending to tasks and his implementation of compensatory strategies was variable; the impact of these on his performance is considered. Pt s/p ACDF 10/3 and hardward noted C3-C-6 with the prevetebral edema as noted on CT soft tissue. He presented with oropharyngeal dysphagia characterized by reduced bolus cohesion, reduced pharyngeal constriction, a pharyngeal delay, reduced hyolaryngeal elavation and reduced anterior laryngeal movement. Some hyolaryngeal elevation was noted but anterior laryngeal movement was reduced and epiglottic inversion was limited, at least partly, due to edema. He was able to swallow very small portions of boluses with repeated swallows, but duration of cricopharyngeal relaxation was reduced. Moderate vallecular residue and pyriform sinus residue were  noted accross limited trials. Penetration (PAS 3, 5) was noted before deglutition and aspiration (PAS 7,8) noted during and after. Aspiration frequently resulted in coughing which was inconsistently effective propelling the aspirate superior to the vocal folds, but it was often ineffective in fully expelling penetrate/aspirate  from the larynx, and a recurrence of aspiration was therefore demonstrated. With a cued effortful swallow and nectar thick liquids via cup, pt was once able to demonstrate improved laryngeal vestibule closure and increased airway protection. This could not be replicated, but this reduced the amount of laryngeal invasion and resulted in there only being laryngeal invasion after the swallow. Pt is at risk for aspiration before, during and after deglutition due to the pharyngeal delay, premature spillage, reduced airway protection, and pharyngeal residue. It is recommended that the NPO status be maitained with continued allowance of ice chips following oral care. SLP will continue to follow pt for treatment, but is anticipated that improvement in the pharyngeal edema will facilitate some increased epiglottic inversion and reduce aspiration risk. SLP Visit Diagnosis Dysphagia, oropharyngeal phase (R13.12) Attention and concentration deficit following -- Frontal lobe and executive function deficit following -- Impact on safety and function Moderate aspiration risk   CHL IP TREATMENT RECOMMENDATION 10/26/2020 Treatment Recommendations Therapy as outlined in treatment plan below   Prognosis 10/26/2020 Prognosis for Safe Diet Advancement Good Barriers to Reach Goals Severity of deficits Barriers/Prognosis Comment -- CHL IP DIET RECOMMENDATION 10/26/2020 SLP Diet Recommendations NPO;Ice chips PRN after oral care Liquid Administration via -- Medication Administration Via alternative means Compensations -- Postural Changes Seated upright at 90 degrees   CHL IP OTHER RECOMMENDATIONS 10/26/2020 Recommended Consults -- Oral Care Recommendations Oral care QID Other Recommendations --   CHL IP FOLLOW UP RECOMMENDATIONS 10/26/2020 Follow up Recommendations None   CHL IP FREQUENCY AND DURATION 10/26/2020 Speech Therapy Frequency (ACUTE ONLY) min 2x/week Treatment Duration 2 weeks      CHL IP ORAL PHASE 10/26/2020 Oral Phase Impaired  Oral - Pudding Teaspoon -- Oral - Pudding Cup -- Oral - Honey Teaspoon -- Oral - Honey Cup -- Oral - Nectar Teaspoon Decreased bolus cohesion;Premature spillage Oral - Nectar Cup Decreased bolus cohesion;Premature spillage Oral - Nectar Straw -- Oral - Thin Teaspoon Decreased bolus cohesion;Premature spillage Oral - Thin Cup --  Oral - Thin Straw -- Oral - Puree -- Oral - Mech Soft -- Oral - Regular -- Oral - Multi-Consistency -- Oral - Pill -- Oral Phase - Comment --  CHL IP PHARYNGEAL PHASE 10/26/2020 Pharyngeal Phase Impaired Pharyngeal- Pudding Teaspoon -- Pharyngeal -- Pharyngeal- Pudding Cup -- Pharyngeal -- Pharyngeal- Honey Teaspoon -- Pharyngeal -- Pharyngeal- Honey Cup -- Pharyngeal -- Pharyngeal- Nectar Teaspoon Penetration/Aspiration before swallow;Penetration/Aspiration during swallow;Penetration/Apiration after swallow;Trace aspiration;Pharyngeal residue - valleculae;Pharyngeal residue - pyriform;Pharyngeal residue - posterior pharnyx;Reduced laryngeal elevation;Reduced airway/laryngeal closure;Reduced anterior laryngeal mobility;Reduced epiglottic inversion Pharyngeal Material enters airway, remains ABOVE vocal cords and not ejected out;Material enters airway, CONTACTS cords and not ejected out;Material enters airway, passes BELOW cords and not ejected out despite cough attempt by patient Pharyngeal- Nectar Cup Penetration/Aspiration before swallow;Penetration/Aspiration during swallow;Penetration/Apiration after swallow;Trace aspiration;Pharyngeal residue - valleculae;Pharyngeal residue - pyriform;Pharyngeal residue - posterior pharnyx;Reduced laryngeal elevation;Reduced airway/laryngeal closure;Reduced anterior laryngeal mobility;Reduced epiglottic inversion;Moderate aspiration Pharyngeal Material enters airway, remains ABOVE vocal cords and not ejected out;Material enters airway, CONTACTS cords and not ejected out;Material enters airway, passes BELOW cords and not ejected out despite cough attempt  by patient Pharyngeal- Nectar Straw -- Pharyngeal -- Pharyngeal- Thin Teaspoon Penetration/Aspiration before swallow;Penetration/Aspiration during swallow;Penetration/Apiration after swallow;Trace aspiration;Pharyngeal residue - valleculae;Pharyngeal residue - pyriform;Pharyngeal residue - posterior pharnyx;Reduced laryngeal elevation;Reduced airway/laryngeal closure;Reduced anterior laryngeal mobility;Reduced epiglottic inversion Pharyngeal Material enters airway, remains ABOVE vocal cords and not ejected out;Material enters airway, CONTACTS cords and not ejected out;Material enters airway, passes BELOW cords and not ejected out despite cough attempt by patient Pharyngeal- Thin Cup -- Pharyngeal -- Pharyngeal- Thin Straw -- Pharyngeal -- Pharyngeal- Puree -- Pharyngeal -- Pharyngeal- Mechanical Soft -- Pharyngeal -- Pharyngeal- Regular -- Pharyngeal -- Pharyngeal- Multi-consistency -- Pharyngeal -- Pharyngeal- Pill -- Pharyngeal -- Pharyngeal Comment --  Shanika I. Vear Clock, MS, CCC-SLP Acute Rehabilitation Services Office number [phone] Pager [phone] Scheryl Marten 10/26/2020, 3:36 PM                   Scheduled Meds:  buPROPion  100 mg Per Tube TID   enoxaparin (LOVENOX) injection  40 mg Subcutaneous Daily   famotidine  20 mg Per Tube BID   feeding supplement (PROSource TF)  45 mL Per Tube BID   FLUoxetine  40 mg Per Tube Daily   free water  200 mL Per Tube Q4H   insulin aspart  0-15 Units Subcutaneous Q4H   insulin aspart  7 Units Subcutaneous Q4H   insulin glargine-yfgn  45 Units Subcutaneous Daily   LORazepam  1 mg Intravenous q1600   LORazepam  2 mg Intravenous BID   metoprolol tartrate  12.5 mg Per Tube Q6H   multivitamin with minerals  1 tablet Per Tube Daily   OLANZapine  5 mg Per Tube QHS   polyethylene glycol  17 g Per Tube Daily   rosuvastatin  20 mg Per Tube QHS   senna-docusate  1 tablet Per Tube BID   valproic acid  500 mg Per Tube TID   Continuous Infusions:   feeding supplement (JEVITY 1.5 CAL/FIBER) 1,000 mL (10/28/20 0618)     LOS: 9 days   Time spent: 27 minutes.   Hughie Closs, MD [facility]  If 7PM-7AM, please contact night-coverage [email or web address]  10/28/2020, 10:52 AM

## 2020-10-29 DIAGNOSIS — J9601 Acute respiratory failure with hypoxia: Secondary | ICD-10-CM | POA: Diagnosis not present

## 2020-10-29 LAB — GLUCOSE, CAPILLARY
Glucose-Capillary: 163 mg/dL — ABNORMAL HIGH (ref 70–99)
Glucose-Capillary: 238 mg/dL — ABNORMAL HIGH (ref 70–99)
Glucose-Capillary: 226 mg/dL — ABNORMAL HIGH (ref 70–99)
Glucose-Capillary: 149 mg/dL — ABNORMAL HIGH (ref 70–99)

## 2020-10-29 NOTE — Progress Notes (Signed)
   Providing Compassionate, Quality Care - Together   Subjective: Patient with sitter and mother at the bedside. Patient reports he feels much better today.  Objective: Vital signs in last 24 hours: Temp:  [97.6 F (36.4 C)-98.2 F (36.8 C)] 98.2 F (36.8 C) (10/16 0522) Pulse Rate:  [82-102] 95 (10/16 0522) Resp:  [15-30] 19 (10/16 0522) BP: (109-118)/(59-72) 117/62 (10/16 0522) SpO2:  [94 %-98 %] 95 % (10/16 0522)  Intake/Output from previous day: 10/15 0701 - 10/16 0700 In: -  Out: 2025 [Urine:2025] Intake/Output this shift: No intake/output data recorded.  Alert and confused, oriented to self PERRLA CN II-XII grossly intact Voice clearer today Nasal cannula Cortrak in place; infusing tube feed MAE, Strength and sensation intact Incision is open to air. Clean, dry, and intact; mild swelling-improved from yesterday  Lab Results: Recent Labs    10/27/20 0402  WBC 15.3*  HGB 10.7*  HCT 32.4*  PLT 338   BMET Recent Labs    10/27/20 0402  NA 143  K 3.9  CL 103  CO2 32  GLUCOSE 152*  BUN 19  CREATININE 0.81  CALCIUM 9.2    Studies/Results: DG CHEST PORT 1 VIEW  Result Date: 10/27/2020 CLINICAL DATA:  Evaluate for pneumonia EXAM: PORTABLE CHEST 1 VIEW COMPARISON:  10/19/2020 FINDINGS: There is a feeding tube with tip well below the level of the GE junction. Stable cardiomediastinal contours. Persistent airspace opacities identified within the right mid and lower lung and retrocardiac left lung base. Bilateral glenohumeral joint osteoarthritis is noted with multiple loose bodies in the left joint space. No acute osseous findings. IMPRESSION: Persistent bilateral pulmonary opacities compatible with multifocal infection. Electronically Signed   By: Signa Kell M.D.   On: 10/27/2020 15:04    Assessment/Plan: Patient underwent C3-4, C4-5, C5-6 ACDF by Dr. [name] on 10/16/2020. He developed pneumonia postoperatively.   LOS: 10 days   -Continue therapies and  mobilize as much as possible -Continue NPO status for now. SLP following and will reassess swallowing as prevertebral swelling improves.     Val Eagle, DNP, AGNP-C Nurse Practitioner  [facility] [address], [place], [place] [address] P: [phone]    F: [phone]  10/29/2020, 9:41 AM

## 2020-10-29 NOTE — Plan of Care (Signed)
  Problem: Education: Goal: Knowledge of General Education information will improve Description: Including pain rating scale, medication(s)/side effects and non-pharmacologic comfort measures Outcome: Progressing   Problem: Clinical Measurements: Goal: Ability to maintain clinical measurements within normal limits will improve Outcome: Progressing Goal: Will remain free from infection Outcome: Progressing Goal: Diagnostic test results will improve Outcome: Progressing Goal: Respiratory complications will improve Outcome: Progressing Goal: Cardiovascular complication will be avoided Outcome: Progressing   Problem: Activity: Goal: Risk for activity intolerance will decrease Outcome: Progressing   Problem: Nutrition: Goal: Adequate nutrition will be maintained Outcome: Progressing   Problem: Elimination: Goal: Will not experience complications related to bowel motility Outcome: Progressing Goal: Will not experience complications related to urinary retention Outcome: Progressing   Problem: Pain Managment: Goal: General experience of comfort will improve Outcome: Progressing   Problem: Skin Integrity: Goal: Risk for impaired skin integrity will decrease Outcome: Progressing   Problem: Safety: Goal: Non-violent Restraint(s) Outcome: Progressing

## 2020-10-29 NOTE — Progress Notes (Signed)
PROGRESS NOTE    Edward Garner  FAO:[id number] DOB: 08-20-1965 DOA: 10/19/2020 PCP: [name], MD   Brief Narrative: 55 year old with past medical history significant for smoker, OSA, hypertension, hyperlipidemia, diabetes type 2, seizure and cervical spondylosis/stenosis status post ACDF on 10/3 presenting from home with confusion.  Patient recently underwent cervical ACDF C3 C6 with Dr. [name] on 10/3 and discharged home on 10/4.  Patient presented to the ER later that day after a syncopal episode at home with negative CT head and cervical CT, requiring scalp laceration repair.  Subsequently was discharged home.  Patient returned to the ER the day of admission due to progressive lethargy and found by EMS with oxygen saturation in the 70s, he was placed on normal breather mask and received Narcan with significant improvement.  Poor oral intake due to ongoing pain and some difficulty with swallowing. Patient was admitted with multifocal pneumonia.  CTA was negative for PE.  CT cervical which showed slightly increased prevertebral swelling compared to 10/4, P diameter of the larynx the level of the epiglottis to 5 mm.  Significant Hospital events: 10/3 cervical ACDF C3-C6 with Dr. [name], discharged home 10/4 10/4 syncope at home, ER eval neg, discharged home 10/6 Admit to PCCM for acute hypoxic respiratory failure 2/2 multifocal pna, on BiPAP 10/7 On 11L [place]  Assessment & Plan:   Active Problems:   Hypoxemia   Acute respiratory failure with hypoxia (HCC)   Malnutrition of moderate degree   Delirium due to another medical condition   Polypharmacy   Dysphagia  1-Acute Hypoxic respiratory failure in the setting of multifocal pneumonia: Concern for aspiration event with prevertebral swelling status post C-spine fusion -History of OSA on CPAP Continue with core track and n.p.o. with speech following Completed 7 days of Unasyn on 10/26/2020. Continue with oxygen supplementation and try  to wean as able to.  Currently still requiring 3 L oxygen and saturating around 95%.  Advised RN to wean oxygen down. Continue pulmonary hygiene.  Repeat chest x-ray on 10/27/2020 shows persistent bilateral pulmonary infiltrates.  2-Dysphagia: CT with slightly increased prevertebral swelling compared to 10/4 Continue with core track and tube feeding. Speech following.  MBS completed which shows oropharyngeal phase dysphagia and moderate aspiration risk.  Remains NPO. Received IV steroids.   Hypernatremia:; Resolved.  Diabetes type 2: Fairly controlled with some elevation this morning but we will continue Semglee 45 units, NovoLog 7 units every 4 hours and SSI.  Seizure disorder: Continue with Depakote.   Bipolar: Continue with Prozac and Wellbutrin. Holding Lyrica He reporter  to nurse last night he wants to jump out of window.. -psych recommend continue with bupropion valporic acid and Prozac. -hold adderal and Vyvasne.  -Alprazolam change to ativan.  -Psych recommend schedule pain medication, his pain is very well controlled.  With as needed medications.  Patient started on Zyprexa for steroid-induced agitation on 10/25/2020 and psychiatry does not recommend continuing that at the time of discharge.  Hypomagnesemia: Resolved.  Hypertension: Controlled.  Cozaar was discontinued on 10/27/2020 due to low blood pressure.  Metoprolol continued.  Recent C-spine fusion by Dr. [name] 10/4: Neurosurgery following   Nutrition Problem: Moderate Malnutrition Etiology: chronic illness    Signs/Symptoms: moderate fat depletion, moderate muscle depletion    Interventions: MVI, Tube feeding  Estimated body mass index is 21.17 kg/m as calculated from the following:   Height as of this encounter: 6' (1.829 m).   Weight as of this encounter: 70.8 kg.   DVT prophylaxis:  Lovenox Code Status: Full code Family Communication: Mother at bedside Disposition Plan:  Status is:  Inpatient  Remains inpatient appropriate because:IV treatments appropriate due to intensity of illness or inability to take PO  Dispo: The patient is from: Home              Anticipated d/c is to: Home              Patient currently is not medically stable to d/c.   Difficult to place patient No   Consultants:  Neurosurgery   Procedures:    Antimicrobials:    Subjective: Patient seen and examined.  Mother and sister at the bedside.  Patient very well alert and in fact very oriented and had reasonable conversation with me today.  No complaints.  Objective: Vitals:   10/28/20 1607 10/28/20 1608 10/28/20 1920 10/29/20 0522  BP: (!) 115/59 (!) 115/59 118/72 117/62  Pulse: (!) 102 (!) 102 82 95  Resp: (!) 30 15 16 19   Temp: 98.1 F (36.7 C)  97.6 F (36.4 C) 98.2 F (36.8 C)  TempSrc: Oral  Oral Axillary  SpO2: 94%  98% 95%  Weight:      Height:        Intake/Output Summary (Last 24 hours) at 10/29/2020 1334 Last data filed at 10/29/2020 1100 Gross per 24 hour  Intake --  Output 2100 ml  Net -2100 ml    Filed Weights   10/26/20 0500 10/27/20 0443 10/28/20 0338  Weight: 70.9 kg 70.8 kg 70.8 kg    Examination:  General exam: Appears calm and comfortable  Respiratory system: Clear to auscultation. Respiratory effort normal. Cardiovascular system: S1 & S2 heard, RRR. No JVD, murmurs, rubs, gallops or clicks. No pedal edema. Gastrointestinal system: Abdomen is nondistended, soft and nontender. No organomegaly or masses felt. Normal bowel sounds heard. Central nervous system: Alert and oriented. No focal neurological deficits. Extremities: Symmetric 5 x 5 power. Skin: No rashes, lesions or ulcers.  Psychiatry: Judgement and insight appear poor    Data Reviewed: I have personally reviewed following labs and imaging studies  CBC: Recent Labs  Lab 10/23/20 0056 10/24/20 0618 10/26/20 0239 10/27/20 0402  WBC 11.1* 10.5 13.7* 15.3*  NEUTROABS  --   --  10.1*  11.4*  HGB 11.7* 11.3* 11.2* 10.7*  HCT 36.0* 34.1* 34.0* 32.4*  MCV 100.3* 96.6 99.1 99.7  PLT 294 297 365 338    Basic Metabolic Panel: Recent Labs  Lab 10/23/20 0813 10/23/20 1833 10/24/20 0618 10/26/20 0239 10/27/20 0402  NA 149* 145 143 146* 143  K 3.7 4.5 4.5 4.0 3.9  CL 108 106 96* 103 103  CO2 32 32 36* 37* 32  GLUCOSE 302* 243* 379* 199* 152*  BUN 14 15 24* 21* 19  CREATININE 0.77 0.71 0.80 0.85 0.81  CALCIUM 9.5 9.5 9.8 9.2 9.2    GFR: Estimated Creatinine Clearance: 105.6 mL/min (by C-G formula based on SCr of 0.81 mg/dL). Liver Function Tests: Recent Labs  Lab 10/24/20 0618  AST 58*  ALT 127*  ALKPHOS 96  BILITOT 0.4  PROT 5.8*  ALBUMIN 2.0*    No results for input(s): LIPASE, AMYLASE in the last 168 hours. Recent Labs  Lab 10/24/20 1031  AMMONIA 22    Coagulation Profile: No results for input(s): INR, PROTIME in the last 168 hours.  Cardiac Enzymes: No results for input(s): CKTOTAL, CKMB, CKMBINDEX, TROPONINI in the last 168 hours. BNP (last 3 results) No results for input(s): PROBNP in  the last 8760 hours. HbA1C: No results for input(s): HGBA1C in the last 72 hours. CBG: Recent Labs  Lab 10/28/20 1921 10/28/20 2350 10/29/20 0347 10/29/20 0755 10/29/20 1111  GLUCAP 201* 89 163* 238* 226*    Lipid Profile: No results for input(s): CHOL, HDL, LDLCALC, TRIG, CHOLHDL, LDLDIRECT in the last 72 hours. Thyroid Function Tests: No results for input(s): TSH, T4TOTAL, FREET4, T3FREE, THYROIDAB in the last 72 hours. Anemia Panel: No results for input(s): VITAMINB12, FOLATE, FERRITIN, TIBC, IRON, RETICCTPCT in the last 72 hours. Sepsis Labs: Recent Labs  Lab 10/27/20 0402  PROCALCITON 0.26     Recent Results (from the past 240 hour(s))  Urine Culture     Status: None   Collection Time: 10/19/20  1:48 PM   Specimen: Urine, Clean Catch  Result Value Ref Range Status   Specimen Description URINE, CLEAN CATCH  Final   Special Requests  NONE  Final   Culture   Final    NO GROWTH Performed at [facility], [address] [place], [place] [address]    Report Status 10/20/2020 FINAL  Final  Resp Panel by RT-PCR (Flu A&B, Covid) Nasopharyngeal Swab     Status: None   Collection Time: 10/19/20  2:31 PM   Specimen: Nasopharyngeal Swab; Nasopharyngeal(NP) swabs in vial transport medium  Result Value Ref Range Status   SARS Coronavirus 2 by RT PCR NEGATIVE NEGATIVE Final    Comment: (NOTE) SARS-CoV-2 target nucleic acids are NOT DETECTED.  The SARS-CoV-2 RNA is generally detectable in upper respiratory specimens during the acute phase of infection. The lowest concentration of SARS-CoV-2 viral copies this assay can detect is 138 copies/mL. A negative result does not preclude SARS-Cov-2 infection and should not be used as the sole basis for treatment or other patient management decisions. A negative result may occur with  improper specimen collection/handling, submission of specimen other than nasopharyngeal swab, presence of viral mutation(s) within the areas targeted by this assay, and inadequate number of viral copies(<138 copies/mL). A negative result must be combined with clinical observations, patient history, and epidemiological information. The expected result is Negative.  Fact Sheet for Patients:  BloggerCourse.com  Fact Sheet for Healthcare Providers:  SeriousBroker.it  This test is no t yet approved or cleared by the [place] FDA and  has been authorized for detection and/or diagnosis of SARS-CoV-2 by FDA under an Emergency Use Authorization (EUA). This EUA will remain  in effect (meaning this test can be used) for the duration of the COVID-19 declaration under Section 564(b)(1) of the Act, 21 U.S.C.section 360bbb-3(b)(1), unless the authorization is terminated  or revoked sooner.       Influenza A by PCR NEGATIVE NEGATIVE Final   Influenza B  by PCR NEGATIVE NEGATIVE Final    Comment: (NOTE) The Xpert Xpress SARS-CoV-2/FLU/RSV plus assay is intended as an aid in the diagnosis of influenza from Nasopharyngeal swab specimens and should not be used as a sole basis for treatment. Nasal washings and aspirates are unacceptable for Xpert Xpress SARS-CoV-2/FLU/RSV testing.  Fact Sheet for Patients: BloggerCourse.com  Fact Sheet for Healthcare Providers: SeriousBroker.it  This test is not yet approved or cleared by the [place] FDA and has been authorized for detection and/or diagnosis of SARS-CoV-2 by FDA under an Emergency Use Authorization (EUA). This EUA will remain in effect (meaning this test can be used) for the duration of the COVID-19 declaration under Section 564(b)(1) of the Act, 21 U.S.C. section 360bbb-3(b)(1), unless the authorization is  terminated or revoked.  Performed at [facility], [address], [place], [place] [address]   Culture, blood (single)     Status: None   Collection Time: 10/19/20  2:38 PM   Specimen: BLOOD  Result Value Ref Range Status   Specimen Description BLOOD SITE NOT SPECIFIED  Final   Special Requests   Final    BOTTLES DRAWN AEROBIC ONLY Blood Culture results may not be optimal due to an inadequate volume of blood received in culture bottles   Culture   Final    NO GROWTH 5 DAYS Performed at [facility], [address] [place], [place] [address]    Report Status 10/24/2020 FINAL  Final          Radiology Studies: No results found.      Scheduled Meds:  buPROPion  100 mg Per Tube TID   enoxaparin (LOVENOX) injection  40 mg Subcutaneous Daily   famotidine  20 mg Per Tube BID   feeding supplement (PROSource TF)  45 mL Per Tube BID   FLUoxetine  40 mg Per Tube Daily   free water  200 mL Per Tube Q4H   insulin aspart  0-15 Units Subcutaneous Q4H   insulin glargine-yfgn  45 Units Subcutaneous Daily    LORazepam  1 mg Intravenous q1600   LORazepam  2 mg Intravenous BID   metoprolol tartrate  12.5 mg Per Tube Q6H   multivitamin with minerals  1 tablet Per Tube Daily   OLANZapine  5 mg Per Tube QHS   polyethylene glycol  17 g Per Tube Daily   rosuvastatin  20 mg Per Tube QHS   senna-docusate  1 tablet Per Tube BID   valproic acid  500 mg Per Tube TID   Continuous Infusions:  feeding supplement (JEVITY 1.5 CAL/FIBER) 1,000 mL (10/29/20 0524)     LOS: 10 days   Time spent: 25 minutes.   Hughie Closs, MD [facility]  If 7PM-7AM, please contact night-coverage [email or web address]  10/29/2020, 1:34 PM

## 2020-10-30 DIAGNOSIS — J9601 Acute respiratory failure with hypoxia: Secondary | ICD-10-CM | POA: Diagnosis not present

## 2020-10-30 LAB — GLUCOSE, CAPILLARY
Glucose-Capillary: 170 mg/dL — ABNORMAL HIGH (ref 70–99)
Glucose-Capillary: 189 mg/dL — ABNORMAL HIGH (ref 70–99)
Glucose-Capillary: 180 mg/dL — ABNORMAL HIGH (ref 70–99)
Glucose-Capillary: 96 mg/dL (ref 70–99)
Glucose-Capillary: 196 mg/dL — ABNORMAL HIGH (ref 70–99)
Glucose-Capillary: 134 mg/dL — ABNORMAL HIGH (ref 70–99)
Glucose-Capillary: 208 mg/dL — ABNORMAL HIGH (ref 70–99)

## 2020-10-30 LAB — BASIC METABOLIC PANEL
Anion gap: 8 (ref 5–15)
BUN: 16 mg/dL (ref 6–20)
CO2: 27 mmol/L (ref 22–32)
Calcium: 8.5 mg/dL — ABNORMAL LOW (ref 8.9–10.3)
Chloride: 100 mmol/L (ref 98–111)
Creatinine, Ser: 0.87 mg/dL (ref 0.61–1.24)
GFR, Estimated: >60 mL/min (ref >60)
Glucose, Bld: 103 mg/dL — ABNORMAL HIGH (ref 70–99)
Potassium: 4.1 mmol/L (ref 3.5–5.1)
Sodium: 135 mmol/L (ref 135–145)

## 2020-10-30 MED ORDER — OLANZAPINE 5 MG PO TABS
5.0000 mg | ORAL_TABLET | Freq: Every day | ORAL | Status: DC
  Administered 2020-10-30 – 2020-11-09 (×10): 5 mg via ORAL
  Filled 2020-10-30 (×12): qty 1

## 2020-10-30 MED ORDER — LORAZEPAM 2 MG/ML IJ SOLN
2.0000 mg | INTRAMUSCULAR | Status: DC
  Administered 2020-10-30 – 2020-11-01 (×3): 2 mg via INTRAVENOUS
  Filled 2020-10-30 (×3): qty 1

## 2020-10-30 MED ORDER — LORAZEPAM 2 MG/ML IJ SOLN: 1.0000 mg | INTRAMUSCULAR | Status: DC

## 2020-10-30 MED ORDER — LORAZEPAM 2 MG/ML IJ SOLN
1.0000 mg | Freq: Two times a day (BID) | INTRAMUSCULAR | Status: DC
  Administered 2020-10-30 – 2020-11-02 (×7): 1 mg via INTRAVENOUS
  Filled 2020-10-30 (×7): qty 1

## 2020-10-30 MED ORDER — OLANZAPINE 2.5 MG PO TABS
2.5000 mg | ORAL_TABLET | Freq: Every day | ORAL | Status: DC
  Administered 2020-10-30 – 2020-11-10 (×11): 2.5 mg via ORAL
  Filled 2020-10-30 (×12): qty 1

## 2020-10-30 MED ORDER — LORAZEPAM 2 MG/ML IJ SOLN: 1.0000 mg | Freq: Three times a day (TID) | INTRAMUSCULAR | Status: DC

## 2020-10-30 MED ORDER — SODIUM CHLORIDE 0.9 % IV BOLUS
1000.0000 mL | Freq: Once | INTRAVENOUS | Status: AC
  Administered 2020-10-30: 1000 mL via INTRAVENOUS

## 2020-10-30 NOTE — Progress Notes (Signed)
Nutrition Follow-up  DOCUMENTATION CODES:   Non-severe (moderate) malnutrition in context of chronic illness  INTERVENTION:  Continue TF via Cortrak tube: Jevity 1.5 at 65 ml/h (1560 ml per day) Prosource TF 45 ml BID Free water flushes 200 ml every 4 hours   Provides 2420 kcal, 122 gm protein, 2386 ml free water total daily.   NUTRITION DIAGNOSIS:   Moderate Malnutrition related to chronic illness as evidenced by moderate fat depletion, moderate muscle depletion.  ongoing  GOAL:   Patient will meet greater than or equal to 90% of their needs  Met with TF  MONITOR:   TF tolerance, Weight trends, Skin  REASON FOR ASSESSMENT:   Consult Enteral/tube feeding initiation and management  ASSESSMENT:   55 y.o. male presented to the ED with lethargy and O2 saturations in the 70's. Pt received a cervical ACDF C3-C6 on 10/3 and d/c on 10/4, pt returned to the ED after passing out and was d/c that day. Pt then had a televisit with PCP for pneumonia and was placed on an antibiotic. PMH of T2DM and HTN. Pt admitted with hypoxia respiratory failure due to pneumonia.  10/03 - cervical ACDF C3-C6  10/04 - discharged home; syncope at home w/ ER visit and later d/c  10/06 - admit to PCCM for acute hypoxic respiratory failure 2/2 multifocal PNA, on BiPAP 10/07 - Cortrak placed (tip gastric per xray), pt on 11L [place] 10/14 - repeat CXR shows persistent bilateral pulmonary infiltrates  Pt has been off oxygen since last night and is saturating well per MD. Pt without complaints at this time.   Pt remains NPO, receiving TF via Cortrak. Current TF orders: Jevity 1.5 at 65 ml/h (1560 ml per day) w/ 24m Prosource TF BID and 2064mfree water 20038m4H (provides 2420 kcal, 122 gm protein, 2386 ml free water total daily). Continue current nutrition plan of care at this time. SLP following. If pt unable to have diet safely advanced, recommend placement of PEG for ongoing nutrition support.   UOP:  2400m77m4 hours I/O: -1719ml59mce admit  Edema: non-pitting edema to BUE and BLE per RN assessment  Admission weight 73.3 kg Current weight 74.5 kg  Medications: Pepcid, Novolog SSI Q4H, 45 units daily Semglee, MVI with minerals, Miralax, Senokot-S Labs reviewed. CBGs: 96-196 x24 hours  Diet Order:   Diet Order             Diet NPO time specified Except for: Ice Chips  Diet effective now                   EDUCATION NEEDS:   No education needs have been identified at this time  Skin:  Skin Assessment: Skin Integrity Issues: Skin Integrity Issues:: Incisions Incisions: Neck  Last BM:  10/16  Height:   Ht Readings from Last 1 Encounters:  10/19/20 6' (1.829 m)    Weight:   Wt Readings from Last 1 Encounters:  10/30/20 74.5 kg    Ideal Body Weight:  80.9 kg  BMI:  Body mass index is 22.28 kg/m.  Estimated Nutritional Needs:   Kcal:  [id number]:  115-130 grams  Fluid:  >/= 2.3 L    AmandLarkin Ina RD, LDN (she/her/hers) RD pager number and weekend/on-call pager number located in [email or web address]

## 2020-10-30 NOTE — Progress Notes (Signed)
Pt has Bipap PRN, pt resting comfortably at this time

## 2020-10-30 NOTE — Progress Notes (Signed)
Subjective: Patient reports doing well, no acute events overnight   Objective: Vital signs in last 24 hours: Temp:  [97.6 F (36.4 C)-98.8 F (37.1 C)] 98.8 F (37.1 C) (10/17 1203) Pulse Rate:  [78-106] 104 (10/17 1203) Resp:  [15-20] 18 (10/17 1203) BP: (70-111)/(19-69) 111/68 (10/17 1203) SpO2:  [91 %-96 %] 93 % (10/17 1203) Weight:  [74.5 kg] 74.5 kg (10/17 0400)  Intake/Output from previous day: 10/16 0701 - 10/17 0700 In: 1000 [IV Piggyback:1000] Out: 2400 [Urine:2400] Intake/Output this shift: Total I/O In: 0  Out: 700 [Urine:700]  Neurologic: Grossly normal  Lab Results: Lab Results  Component Value Date   WBC 15.3 (H) 10/27/2020   HGB 10.7 (L) 10/27/2020   HCT 32.4 (L) 10/27/2020   MCV 99.7 10/27/2020   PLT 338 10/27/2020   Lab Results  Component Value Date   INR 1.0 10/16/2020   BMET Lab Results  Component Value Date   NA 135 10/30/2020   K 4.1 10/30/2020   CL 100 10/30/2020   CO2 27 10/30/2020   GLUCOSE 103 (H) 10/30/2020   BUN 16 10/30/2020   CREATININE 0.87 10/30/2020   CALCIUM 8.5 (L) 10/30/2020    Studies/Results: No results found.  Assessment/Plan: S/p pneumonia and dysphagia following an acdf. Still not able to swallow. He is now on room air. Continue therapies. No new nsgy recom.   LOS: 11 days    Edward Garner 10/30/2020, 4:21 PM

## 2020-10-30 NOTE — Consult Note (Signed)
[facility] Face-to-Face Psychiatry Consult   Reason for Consult:  anxiety, suicidal thought (said wants to jump out of window, wants to bang head to the wall).  Referring Physician:  Dr. Jacqulyn Bath Patient Identification: Edward Garner MRN:  [id number] Principal Diagnosis: <principal problem not specified> Diagnosis:  Active Problems:   Hypoxemia   Acute respiratory failure with hypoxia (HCC)   Malnutrition of moderate degree   Delirium due to another medical condition   Polypharmacy   Dysphagia   Total Time spent with patient: 30 minutes  Subjective:   Edward Garner is a 55 y.o. male patient admitted with aspiration pneumonia at the spinal procedure.  He is previously been diagnosed with schizoaffective disorder versus bipolar disorder, ADHD, anxiety.  Patient has diagnosed medical history of adrenal disease, but diabetes, hyperlipidemia, sleep apnea, spinal stenosis.  Patient is current psychotropic medications include alprazolam, Adderall, bupropion, Depakote, Prozac, prazosin, Vyvanse, zolpidem.  On evaluation patient continues to present confused and disoriented at times, while at other times he is able to have a linear conversation and is very lucid.  Patient is very difficult to remain on task, and at one time request his Adderall be given to him.  Based off chart review patient does appear to have some progression since last seen by Dr. Gasper Sells.  Patient continues to make irrelevant statements, and shows some difficulty in answering questions.  He is able to answer questions, although he does not answer them appropriately.  For example he is asked about his appetite in which she begins to ruminate about" this tube, dish piece of shit food, and wanting to get out of here. "  Patient then begins to talk about his weight " I used to weigh 190 pounds and now I have lost weight.  You will have to ask my mom about my appetite" and another point during the conversation he tells mom" you got  to get a grasp on these things, seeking help me to better understand."   Patient continues to remain tangential, disturbed sensorium, and continues to present with some degree of delirium, in a setting of steroid-induced psychosis.    HPI:  56 year old with past medical history significant for smoker, OSA, hypertension, hyperlipidemia, diabetes type 2, seizure and cervical spondylosis/stenosis status post ACDF on 10/3 presenting from home with confusion.  Patient recently underwent cervical ACDF C3 C6 with Dr. [name] on 10/3 and discharged home on 10/4.  Patient presented to the ER later that day after a syncopal episode at home with negative CT head and cervical CT, requiring scalp laceration repair.  Subsequently was discharged home.  Patient returned to the ER the day of admission due to progressive lethargy and found by EMS with oxygen saturation in the 70s, he was placed on normal breather mask and received Narcan with significant improvement.  Poor oral intake due to ongoing pain and some difficulty with swallowing.  Past Psychiatric History: see above  Risk to Self:   Risk to Others:   Prior Inpatient Therapy:   Prior Outpatient Therapy:    Past Medical History:  Past Medical History:  Diagnosis Date   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HCC)    Bipolar disorder (HCC)    Complication of anesthesia    Diabetes mellitus without complication (HCC)    Hyperlipidemia    Hypertension    Kidney disease    PONV (postoperative nausea and vomiting)    Sleep apnea    Sleep apnea, unspecified 10/19/2018   Spinal stenosis  Past Surgical History:  Procedure Laterality Date   ANTERIOR CERVICAL DECOMP/DISCECTOMY FUSION N/A 10/16/2020   Procedure: ACDF - C3-C4 - C4-C5 - C5-C6;  Surgeon: Tia Alert, MD;  Location: [facility];  Service: Neurosurgery;  Laterality: N/A;   TONSILLECTOMY     Family History:  Family History  Problem Relation Age of Onset   Non-Hodgkin's lymphoma Mother    Diabetes Father     Cancer Father    Thyroid disease Father    Hypertension Father    Hyperlipidemia Father    Bipolar disorder Father    Family Psychiatric  History: see HPI Social History:  Social History   Substance and Sexual Activity  Alcohol Use Not Currently     Social History   Substance and Sexual Activity  Drug Use Never    Social History   Socioeconomic History   Marital status: Single    Spouse name: Not on file   Number of children: Not on file   Years of education: Not on file   Highest education level: Not on file  Occupational History   Not on file  Tobacco Use   Smoking status: Former    Packs/day: 0.50    Years: 0.50    Pack years: 0.25    Types: Cigarettes    Quit date: 1996    Years since quitting: 26.8   Smokeless tobacco: Former    Types: Snuff  Vaping Use   Vaping Use: Never used  Substance and Sexual Activity   Alcohol use: Not Currently   Drug use: Never   Sexual activity: Not on file  Other Topics Concern   Not on file  Social History Narrative   Not on file   Social Determinants of Health   Financial Resource Strain: Not on file  Food Insecurity: Not on file  Transportation Needs: Not on file  Physical Activity: Not on file  Stress: Not on file  Social Connections: Not on file   Additional Social History:    Allergies:   Allergies  Allergen Reactions   Gramineae Pollens     Labs:  Results for orders placed or performed during the hospital encounter of 10/19/20 (from the past 48 hour(s))  Glucose, capillary     Status: Abnormal   Collection Time: 10/28/20  4:00 PM  Result Value Ref Range   Glucose-Capillary 170 (H) 70 - 99 mg/dL    Comment: Glucose reference range applies only to samples taken after fasting for at least 8 hours.   Comment 1 Notify RN   Glucose, capillary     Status: Abnormal   Collection Time: 10/28/20  7:21 PM  Result Value Ref Range   Glucose-Capillary 201 (H) 70 - 99 mg/dL    Comment: Glucose reference range  applies only to samples taken after fasting for at least 8 hours.  Glucose, capillary     Status: None   Collection Time: 10/28/20 11:50 PM  Result Value Ref Range   Glucose-Capillary 89 70 - 99 mg/dL    Comment: Glucose reference range applies only to samples taken after fasting for at least 8 hours.  Glucose, capillary     Status: Abnormal   Collection Time: 10/29/20  3:47 AM  Result Value Ref Range   Glucose-Capillary 163 (H) 70 - 99 mg/dL    Comment: Glucose reference range applies only to samples taken after fasting for at least 8 hours.  Glucose, capillary     Status: Abnormal   Collection Time: 10/29/20  7:55 AM  Result Value Ref Range   Glucose-Capillary 238 (H) 70 - 99 mg/dL    Comment: Glucose reference range applies only to samples taken after fasting for at least 8 hours.  Glucose, capillary     Status: Abnormal   Collection Time: 10/29/20 11:11 AM  Result Value Ref Range   Glucose-Capillary 226 (H) 70 - 99 mg/dL    Comment: Glucose reference range applies only to samples taken after fasting for at least 8 hours.  Glucose, capillary     Status: Abnormal   Collection Time: 10/29/20  3:34 PM  Result Value Ref Range   Glucose-Capillary 149 (H) 70 - 99 mg/dL    Comment: Glucose reference range applies only to samples taken after fasting for at least 8 hours.  Glucose, capillary     Status: Abnormal   Collection Time: 10/30/20  2:28 AM  Result Value Ref Range   Glucose-Capillary 189 (H) 70 - 99 mg/dL    Comment: Glucose reference range applies only to samples taken after fasting for at least 8 hours.  Glucose, capillary     Status: Abnormal   Collection Time: 10/30/20  6:25 AM  Result Value Ref Range   Glucose-Capillary 180 (H) 70 - 99 mg/dL    Comment: Glucose reference range applies only to samples taken after fasting for at least 8 hours.  Basic metabolic panel     Status: Abnormal   Collection Time: 10/30/20  8:15 AM  Result Value Ref Range   Sodium 135 135 - 145  mmol/L   Potassium 4.1 3.5 - 5.1 mmol/L   Chloride 100 98 - 111 mmol/L   CO2 27 22 - 32 mmol/L   Glucose, Bld 103 (H) 70 - 99 mg/dL    Comment: Glucose reference range applies only to samples taken after fasting for at least 8 hours.   BUN 16 6 - 20 mg/dL   Creatinine, Ser 6.27 0.61 - 1.24 mg/dL   Calcium 8.5 (L) 8.9 - 10.3 mg/dL   GFR, Estimated >03 >50 mL/min    Comment: (NOTE) Calculated using the CKD-EPI Creatinine Equation (2021)    Anion gap 8 5 - 15    Comment: Performed at [facility], [address] [place], [place] [address]  Glucose, capillary     Status: None   Collection Time: 10/30/20  8:26 AM  Result Value Ref Range   Glucose-Capillary 96 70 - 99 mg/dL    Comment: Glucose reference range applies only to samples taken after fasting for at least 8 hours.   Comment 1 Notify RN    Comment 2 Document in Chart   Glucose, capillary     Status: Abnormal   Collection Time: 10/30/20 11:58 AM  Result Value Ref Range   Glucose-Capillary 196 (H) 70 - 99 mg/dL    Comment: Glucose reference range applies only to samples taken after fasting for at least 8 hours.   Comment 1 Notify RN    Comment 2 Document in Chart     Current Facility-Administered Medications  Medication Dose Route Frequency Provider Last Rate Last Admin   buPROPion [facility]) tablet 100 mg  100 mg Per Tube TID [name], MD   100 mg at 10/30/20 0954   docusate (COLACE) 50 MG/5ML liquid 100 mg  100 mg Per Tube BID PRN [name], MD       enoxaparin (LOVENOX) injection 40 mg  40 mg Subcutaneous Daily Hughie Closs, MD   40  mg at 10/30/20 0956   famotidine (PEPCID) tablet 20 mg  20 mg Per Tube BID [name], MD   20 mg at 10/30/20 0955   feeding supplement (JEVITY 1.5 CAL/FIBER) liquid 1,000 mL  1,000 mL Per Tube Continuous [name], MD 65 mL/hr at 10/29/20 0524 1,000 mL at 10/29/20 0524   feeding supplement (PROSource TF) liquid 45 mL  45 mL Per Tube BID [name], MD   45 mL at 10/30/20 0956   FLUoxetine (PROZAC) 20 MG/5ML solution 40 mg  40 mg Per Tube Daily [name], MD   40 mg at 10/30/20 0956   free water 200 mL  200 mL Per Tube Q4H Regalado, Belkys A, MD   200 mL at 10/30/20 1252   hydrALAZINE (APRESOLINE) injection 10 mg  10 mg Intravenous Q4H PRN Tobey Grim, NP       HYDROmorphone (DILAUDID) injection 0.5 mg  0.5 mg Intravenous Q4H PRN Regalado, Belkys A, MD   0.5 mg at 10/28/20 0047   insulin aspart (novoLOG) injection 0-15 Units  0-15 Units Subcutaneous Q4H Tobey Grim, NP   3 Units at 10/30/20 1252   insulin glargine-yfgn (SEMGLEE) injection 45 Units  45 Units Subcutaneous Daily Hughie Closs, MD   45 Units at 10/30/20 0957   ipratropium-albuterol (DUONEB) 0.5-2.5 (3) MG/3ML nebulizer solution 3 mL  3 mL Nebulization Q6H PRN Mikey College T, MD       labetalol (NORMODYNE) injection 10 mg  10 mg Intravenous Q2H PRN Tobey Grim, NP       lip balm (CARMEX) ointment   Topical PRN Rica Mote, MD       LORazepam (ATIVAN) injection 1 mg  1 mg Intravenous BID Hughie Closs, MD       And   LORazepam (ATIVAN) injection 2 mg  2 mg Intravenous Q24H Hughie Closs, MD       multivitamin with minerals tablet 1 tablet  1 tablet Per Tube Daily [name], MD   1 tablet at 10/30/20 0954   OLANZapine (ZYPREXA) tablet 2.5 mg  2.5 mg Per Tube BID PRN Cinderella, Margaret A   2.5 mg at 10/28/20 0121   OLANZapine (ZYPREXA) tablet 5 mg  5 mg Per Tube QHS Cinderella, Margaret A   5 mg at 10/29/20 2200   ondansetron (ZOFRAN) injection 4 mg  4 mg Intravenous Q6H PRN Mikey College T, MD       oxyCODONE-acetaminophen (PERCOCET/ROXICET) 5-325 MG per tablet 1-2 tablet  1-2 tablet Per Tube Q6H PRN Regalado, Belkys A, MD   2 tablet at 10/29/20 1112   polyethylene glycol (MIRALAX / GLYCOLAX) packet 17 g  17 g Per Tube Daily PRN [name], MD   17 g at 10/25/20 0950   polyethylene glycol (MIRALAX / GLYCOLAX) packet 17 g  17  g Per Tube Daily Regalado, Belkys A, MD   17 g at 10/30/20 0955   rosuvastatin (CRESTOR) tablet 20 mg  20 mg Per Tube QHS [name], MD   20 mg at 10/29/20 2200   senna-docusate (Senokot-S) tablet 1 tablet  1 tablet Per Tube BID Regalado, Belkys A, MD   1 tablet at 10/30/20 0955   valproic acid (DEPAKENE) 250 MG/5ML solution 500 mg  500 mg Per Tube TID [name], MD   500 mg at 10/30/20 1121    Musculoskeletal: Strength & Muscle Tone:  unable to assess Gait & Station:  unable  to assess Patient leans: N/A            Psychiatric Specialty Exam:  Presentation  General Appearance: Casual  Eye Contact:Fair  Speech:Clear and Coherent; Normal Rate  Speech Volume:Normal  Handedness:Right   Mood and Affect  Mood:Anxious; Euphoric  Affect:Blunt   Thought Process  Thought Processes:Irrevelant; Linear  Descriptions of Associations:Loose  Orientation:Full (Time, Place and Person)  Thought Content:Illogical; Tangential; Scattered  History of Schizophrenia/Schizoaffective disorder:No data recorded Duration of Psychotic Symptoms:No data recorded Hallucinations:Hallucinations: None  Ideas of Reference:Delusions  Suicidal Thoughts:Suicidal Thoughts: No  Homicidal Thoughts:Homicidal Thoughts: No   Sensorium  Memory:Immediate Fair; Recent Fair; Remote Fair  Judgment:Poor  Insight:Shallow   Executive Functions  Concentration:Fair  Attention Span:Fair  Recall:Poor  Fund of Knowledge:Poor  Language:Fair   Psychomotor Activity  Psychomotor Activity:Psychomotor Activity: Normal   Assets  Assets:Communication Skills; Desire for Improvement; Financial Resources/Insurance; Housing; Physical Health; Leisure Time   Sleep  Sleep:Sleep: Fair   Physical Exam: Physical Exam ROS Blood pressure 111/68, pulse (!) 104, temperature 98.8 F (37.1 C), temperature source Oral, resp. rate 18, height 6' (1.829 m), weight 74.5 kg, SpO2 93 %. Body  mass index is 22.28 kg/m.  Treatment Plan Summary: Plan we will continue current medications to include bupropion 100 mg p.o. 3 times daily, valproic acid 500 mg p.o. 3 times daily, fluoxetine 40 mg p.o. daily -Will reduce lorazepam to 1 mg twice a day, and 2 mg p.o. nightly. -Consider patient continues to be delirious and delusional at times, will add additional olanzapine 2.5 mg p.o. every morning, and 5 mg p.o. nightly. -Will continue with as needed olanzapine 2.5 mg p.o. twice daily, if as needed medication is administered please document reasons for administration as a progress note.  Also expect worsening confusion, agitation, and some aggression as patient begins to taper down benzodiazepines. *-Will order a repeat valproate acid level  in the morning. Disposition: No evidence of imminent risk to self or others at present.   Patient does not meet criteria for psychiatric inpatient admission. Supportive therapy provided about ongoing stressors. Discussed crisis plan, support from social network, calling 911, coming to the Emergency Department, and calling Suicide Hotline.  Maryagnes Amos, FNP 10/30/2020 2:42 PM

## 2020-10-30 NOTE — Progress Notes (Signed)
Speech Language Pathology Treatment: Dysphagia  Patient Details Name: Edward Garner MRN: [id number] DOB: [birth year] Today's Date: 10/30/2020 Time: 4431-5400 SLP Time Calculation (min) (ACUTE ONLY): 33 min  Assessment / Plan / Recommendation Clinical Impression  Ongoing assessment of Edward Garner swallowing reveals persisting clinical s/sx of dysphagia.  He provided his own oral care, brushing his teeth and suctioning with assist.  He is clearly unhappy with cortrak and made multiple references to its removal - confusion prevents him from understanding its purpose.   When provided with teaspoons of water and ice chips he coughed consistently with each bolus, indicating inadequate airway protection.  CT of spine and the 8/13 MBS were reviewed with his mother, identifying the area of edema that is prohibiting functional swallowing. We reviewed the issues involved: time necessary for healing; the variation in recovery among individuals; the overlying factors that can complicate recovery (e.g., mentation); the benefit of the cortrak to bridge that time; and the occasional need to progress to a PEG tube if more time is needed.  She verbalized understanding.  Current status D/W Dr. Jacqulyn Garner and pt's RN.  SLP will continue to follow for readiness for repeat MBS. Please encourage Edward Garner to brush his own teeth multiple times a day. Allow ice chips after oral care to mobilize swallowing structures and help maintain health of oral mucosa.    HPI HPI: Pt is a 55 y/o male who recently underwent cervical ACDF C3-C6 on 10/3 and discharged home on 10/4.  Patient presented to ER later that day after syncopal episode at home with negative CTH and cervical CT and required scalp laceration repair.  No other complaints, normal ddimer and hemodynamics and therefore discharged home. Pt returned to tje ER again for progressive lethargy at home and was found by EMS with O2 saturations in the 70's, placed on NRB and  no improvement to narcan.  Additionally reported decrease oral intake due to ongoing pain and some difficulty swallowing, CT cervical showed slightly increased prevertebral swelling compared to 10/4 and narrowed AP diameter of the larynx the level of the epiglottis to 5 mm, CTH negative for acute intracranial process, and CTA PE was negative for PE, but showed multifocal pneumonia and thickening of esophageal wall possibly reflecting esophagitis, and hepatomegaly.      SLP Plan  Continue with current plan of care      Recommendations for follow up therapy are one component of a multi-disciplinary discharge planning process, led by the attending physician.  Recommendations may be updated based on patient status, additional functional criteria and insurance authorization.    Recommendations  Diet recommendations: NPO (ice chips) Medication Administration: Via alternative means                Oral Care Recommendations: Oral care QID;Oral care prior to ice chip/H20 Follow up Recommendations: Other (comment) (tba) SLP Visit Diagnosis: Dysphagia, oropharyngeal phase (R13.12) Plan: Continue with current plan of care       GO               Lillyahna Hemberger L. Samson Frederic, MA CCC/SLP Acute Rehabilitation Services Office number [phone] Pager [phone]  Blenda Mounts Laurice  10/30/2020, 12:44 PM

## 2020-10-30 NOTE — Progress Notes (Signed)
Pt BP is running soft starting off 84/46 after placing in trendelenburg BP increased to 99/51. Monica Becton MD was paged BY amion no new order given will cont to monitor.

## 2020-10-30 NOTE — Progress Notes (Signed)
Inpatient Rehabilitation Admissions Coordinator   I met at bedside with patient, his Mom and his [employer] cabin crew. We continue to discuss caregiver supports he will need after any rehab venue until his cognition returns to baseline. Mom is open to hire caregiver supports as needed for a few weeks. I await further progress with therapy and his delirium before possible CIR admit. I discussed with Mom the possible need for other rehab venues options also.  Danne Baxter, RN, MSN Rehab Admissions Coordinator [phone] 10/30/2020 12:32 PM

## 2020-10-30 NOTE — Progress Notes (Signed)
PROGRESS NOTE    Edward Garner  FGH:[id number] DOB: 1965/03/15 DOA: 10/19/2020 PCP: [name], MD   Brief Narrative: 55 year old with past medical history significant for smoker, OSA, hypertension, hyperlipidemia, diabetes type 2, seizure and cervical spondylosis/stenosis status post ACDF on 10/3 presenting from home with confusion.  Patient recently underwent cervical ACDF C3 C6 with Dr. [name] on 10/3 and discharged home on 10/4.  Patient presented to the ER later that day after a syncopal episode at home with negative CT head and cervical CT, requiring scalp laceration repair.  Subsequently was discharged home.  Patient returned to the ER the day of admission due to progressive lethargy and found by EMS with oxygen saturation in the 70s, he was placed on normal breather mask and received Narcan with significant improvement.  Poor oral intake due to ongoing pain and some difficulty with swallowing. Patient was admitted with multifocal pneumonia.  CTA was negative for PE.  CT cervical which showed slightly increased prevertebral swelling compared to 10/4, P diameter of the larynx the level of the epiglottis to 5 mm.  Significant Hospital events: 10/3 cervical ACDF C3-C6 with Dr. [name], discharged home 10/4 10/4 syncope at home, ER eval neg, discharged home 10/6 Admit to PCCM for acute hypoxic respiratory failure 2/2 multifocal pna, on BiPAP 10/7 On 11L [place]  Assessment & Plan:   Active Problems:   Hypoxemia   Acute respiratory failure with hypoxia (HCC)   Malnutrition of moderate degree   Delirium due to another medical condition   Polypharmacy   Dysphagia  1-Acute Hypoxic respiratory failure in the setting of multifocal pneumonia: Concern for aspiration event with prevertebral swelling status post C-spine fusion -History of OSA on CPAP Continue with core track and n.p.o. with speech following Completed 7 days of Unasyn on 10/26/2020.  Repeat chest x-ray on 10/27/2020 shows  persistent bilateral pulmonary infiltrates.  However lungs clear to auscultation on exam and now he is off of oxygen since the night of 10/29/2020 and saturating well over 92% and is comfortable.  2-Dysphagia: CT with slightly increased prevertebral swelling compared to 10/4 Continue with core track and tube feeding. Speech following.  MBS completed which shows oropharyngeal phase dysphagia and moderate aspiration risk.  Remains NPO.  Advised RN to call SLP again to assess him. Received IV steroids.   Hypernatremia:; Resolved.  Diabetes type 2: Fairly controlled,will continue Semglee 45 units, NovoLog 7 units every 4 hours and SSI.  Seizure disorder: Continue with Depakote.   Bipolar: Continue with Prozac and Wellbutrin. Holding Lyrica He reporter  to nurse last night he wants to jump out of window.. -psych recommend continue with bupropion valporic acid and Prozac. -hold adderal and Vyvasne.  -Alprazolam change to ativan.  -Psych recommend schedule pain medication, his pain is very well controlled.  With as needed medications.  Patient started on Zyprexa for steroid-induced agitation on 10/25/2020 and psychiatry does not recommend continuing that at the time of discharge.  Per psychiatry recommendation, I am going to reduce his Ativan to 1/1/2 milligrams IV.  Hypomagnesemia: Resolved.  Hypertension: Controlled.  Cozaar was discontinued on 10/27/2020 due to low blood pressure.  Metoprolol continued.  Recent C-spine fusion by Dr. [name] 10/4: Neurosurgery following   Nutrition Problem: Moderate Malnutrition Etiology: chronic illness    Signs/Symptoms: moderate fat depletion, moderate muscle depletion    Interventions: MVI, Tube feeding  Estimated body mass index is 22.28 kg/m as calculated from the following:   Height as of this encounter: 6' (1.829 m).  Weight as of this encounter: 74.5 kg.   DVT prophylaxis: Lovenox Code Status: Full code Family Communication: Mother  at bedside Disposition Plan:  Status is: Inpatient  Remains inpatient appropriate because: Meets inpatient criteria.  Dispo: The patient is from: Home              Anticipated d/c is to: Home              Patient currently is medically stable to d/c.   Difficult to place patient No   Consultants:  Neurosurgery   Procedures:    Antimicrobials:    Subjective: Patient seen and examined.  Mother at the bedside.  Patient fully alert and oriented.  Comfortable.  No shortness of breath.  On room air.  Objective: Vitals:   10/30/20 0000 10/30/20 0400 10/30/20 0828 10/30/20 1100  BP: (!) 99/51 (!) 70/19 107/69   Pulse: 78  (!) 106   Resp: 20 15 18    Temp: 98.6 F (37 C) 98 F (36.7 C) 97.6 F (36.4 C)   TempSrc: Oral Axillary    SpO2: 96% 95% 91% 93%  Weight:  74.5 kg    Height:        Intake/Output Summary (Last 24 hours) at 10/30/2020 1140 Last data filed at 10/30/2020 1041 Gross per 24 hour  Intake 1000 ml  Output 1600 ml  Net -600 ml    Filed Weights   10/27/20 0443 10/28/20 0338 10/30/20 0400  Weight: 70.8 kg 70.8 kg 74.5 kg    Examination:  General exam: Appears calm and comfortable, cortrak in place Respiratory system: Clear to auscultation. Respiratory effort normal. Cardiovascular system: S1 & S2 heard, RRR. No JVD, murmurs, rubs, gallops or clicks. No pedal edema. Gastrointestinal system: Abdomen is nondistended, soft and nontender. No organomegaly or masses felt. Normal bowel sounds heard. Central nervous system: Alert and oriented. No focal neurological deficits. Extremities: Symmetric 5 x 5 power. Skin: No rashes, lesions or ulcers.  Psychiatry: Judgement and insight appear poor   Data Reviewed: I have personally reviewed following labs and imaging studies  CBC: Recent Labs  Lab 10/24/20 0618 10/26/20 0239 10/27/20 0402  WBC 10.5 13.7* 15.3*  NEUTROABS  --  10.1* 11.4*  HGB 11.3* 11.2* 10.7*  HCT 34.1* 34.0* 32.4*  MCV 96.6 99.1 99.7   PLT 297 365 338    Basic Metabolic Panel: Recent Labs  Lab 10/23/20 1833 10/24/20 0618 10/26/20 0239 10/27/20 0402 10/30/20 0815  NA 145 143 146* 143 135  K 4.5 4.5 4.0 3.9 4.1  CL 106 96* 103 103 100  CO2 32 36* 37* 32 27  GLUCOSE 243* 379* 199* 152* 103*  BUN 15 24* 21* 19 16  CREATININE 0.71 0.80 0.85 0.81 0.87  CALCIUM 9.5 9.8 9.2 9.2 8.5*    GFR: Estimated Creatinine Clearance: 103.5 mL/min (by C-G formula based on SCr of 0.87 mg/dL). Liver Function Tests: Recent Labs  Lab 10/24/20 0618  AST 58*  ALT 127*  ALKPHOS 96  BILITOT 0.4  PROT 5.8*  ALBUMIN 2.0*    No results for input(s): LIPASE, AMYLASE in the last 168 hours. Recent Labs  Lab 10/24/20 1031  AMMONIA 22    Coagulation Profile: No results for input(s): INR, PROTIME in the last 168 hours.  Cardiac Enzymes: No results for input(s): CKTOTAL, CKMB, CKMBINDEX, TROPONINI in the last 168 hours. BNP (last 3 results) No results for input(s): PROBNP in the last 8760 hours. HbA1C: No results for input(s): HGBA1C in the last  72 hours. CBG: Recent Labs  Lab 10/29/20 1111 10/29/20 1534 10/30/20 0228 10/30/20 0625 10/30/20 0826  GLUCAP 226* 149* 189* 180* 96    Lipid Profile: No results for input(s): CHOL, HDL, LDLCALC, TRIG, CHOLHDL, LDLDIRECT in the last 72 hours. Thyroid Function Tests: No results for input(s): TSH, T4TOTAL, FREET4, T3FREE, THYROIDAB in the last 72 hours. Anemia Panel: No results for input(s): VITAMINB12, FOLATE, FERRITIN, TIBC, IRON, RETICCTPCT in the last 72 hours. Sepsis Labs: Recent Labs  Lab 10/27/20 0402  PROCALCITON 0.26     No results found for this or any previous visit (from the past 240 hour(s)).         Radiology Studies: No results found.   Scheduled Meds:  buPROPion  100 mg Per Tube TID   enoxaparin (LOVENOX) injection  40 mg Subcutaneous Daily   famotidine  20 mg Per Tube BID   feeding supplement (PROSource TF)  45 mL Per Tube BID    FLUoxetine  40 mg Per Tube Daily   free water  200 mL Per Tube Q4H   insulin aspart  0-15 Units Subcutaneous Q4H   insulin glargine-yfgn  45 Units Subcutaneous Daily   LORazepam  1 mg Intravenous q1600   LORazepam  2 mg Intravenous BID   multivitamin with minerals  1 tablet Per Tube Daily   OLANZapine  5 mg Per Tube QHS   polyethylene glycol  17 g Per Tube Daily   rosuvastatin  20 mg Per Tube QHS   senna-docusate  1 tablet Per Tube BID   valproic acid  500 mg Per Tube TID   Continuous Infusions:  feeding supplement (JEVITY 1.5 CAL/FIBER) 1,000 mL (10/29/20 0524)     LOS: 11 days   Time spent: 26 minutes.   Hughie Closs, MD [facility]  If 7PM-7AM, please contact night-coverage [email or web address]  10/30/2020, 11:40 AM

## 2020-10-31 DIAGNOSIS — Z79899 Other long term (current) drug therapy: Secondary | ICD-10-CM | POA: Diagnosis not present

## 2020-10-31 DIAGNOSIS — E440 Moderate protein-calorie malnutrition: Secondary | ICD-10-CM

## 2020-10-31 DIAGNOSIS — F05 Delirium due to known physiological condition: Secondary | ICD-10-CM | POA: Diagnosis not present

## 2020-10-31 DIAGNOSIS — J9601 Acute respiratory failure with hypoxia: Secondary | ICD-10-CM | POA: Diagnosis not present

## 2020-10-31 LAB — GLUCOSE, CAPILLARY
Glucose-Capillary: 149 mg/dL — ABNORMAL HIGH (ref 70–99)
Glucose-Capillary: 153 mg/dL — ABNORMAL HIGH (ref 70–99)
Glucose-Capillary: 179 mg/dL — ABNORMAL HIGH (ref 70–99)
Glucose-Capillary: 187 mg/dL — ABNORMAL HIGH (ref 70–99)
Glucose-Capillary: 203 mg/dL — ABNORMAL HIGH (ref 70–99)
Glucose-Capillary: 209 mg/dL — ABNORMAL HIGH (ref 70–99)

## 2020-10-31 LAB — COMPREHENSIVE METABOLIC PANEL
ALT: 42 U/L (ref 0–44)
AST: 28 U/L (ref 15–41)
Albumin: 1.9 g/dL — ABNORMAL LOW (ref 3.5–5.0)
Alkaline Phosphatase: 84 U/L (ref 38–126)
Anion gap: 8 (ref 5–15)
BUN: 19 mg/dL (ref 6–20)
CO2: 26 mmol/L (ref 22–32)
Calcium: 8.8 mg/dL — ABNORMAL LOW (ref 8.9–10.3)
Chloride: 100 mmol/L (ref 98–111)
Creatinine, Ser: 0.93 mg/dL (ref 0.61–1.24)
GFR, Estimated: >60 mL/min (ref >60)
Glucose, Bld: 271 mg/dL — ABNORMAL HIGH (ref 70–99)
Potassium: 4.1 mmol/L (ref 3.5–5.1)
Sodium: 134 mmol/L — ABNORMAL LOW (ref 135–145)
Total Bilirubin: 0.3 mg/dL (ref 0.3–1.2)
Total Protein: 6.1 g/dL — ABNORMAL LOW (ref 6.5–8.1)

## 2020-10-31 LAB — VALPROIC ACID LEVEL: Valproic Acid Lvl: 43 ug/mL — ABNORMAL LOW (ref 50.0–100.0)

## 2020-10-31 LAB — AMMONIA: Ammonia: 25 umol/L (ref 9–35)

## 2020-10-31 MED ORDER — INSULIN GLARGINE-YFGN 100 UNIT/ML ~~LOC~~ SOLN
50.0000 [IU] | Freq: Every day | SUBCUTANEOUS | Status: DC
  Administered 2020-10-31: 50 [IU] via SUBCUTANEOUS
  Filled 2020-10-31 (×2): qty 0.5

## 2020-10-31 MED ORDER — BUPROPION HCL 100 MG PO TABS
100.0000 mg | ORAL_TABLET | Freq: Two times a day (BID) | ORAL | Status: DC
  Administered 2020-10-31 – 2020-11-10 (×17): 100 mg
  Filled 2020-10-31 (×22): qty 1

## 2020-10-31 NOTE — Progress Notes (Signed)
Bipap not needed at this time. Pt resting comfortably.
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
## 2020-10-31 NOTE — Care Management Important Message (Signed)
Important Message  Patient Details  Name: Edward Garner MRN: [id number] Date of Birth: [birth year]   Medicare Important Message Given:  Yes     Hisayo Delossantos 10/31/2020, 2:09 PM

## 2020-10-31 NOTE — Consult Note (Signed)
[facility] Face-to-Face Psychiatry Consult   Reason for Consult: Suicidal statements Referring Physician: [name], MD.  Patient Identification: Edward Garner MRN:  [id number] Principal Diagnosis: <principal problem not specified> Diagnosis:  Active Problems:   Hypoxemia   Acute respiratory failure with hypoxia (HCC)   Malnutrition of moderate degree   Delirium due to another medical condition   Polypharmacy   Dysphagia   Total Time spent with patient: 20 minutes   Assessment  Edward Garner is a 55 y.o. male admitted medically for 10/19/2020 12:55 PM for aspiration pneumonia after a spinal procedure. He carries the psychiatric diagnoses of schizoaffective disorder vs bipolar disorder (per mom), ADHD and anxiety and has a past medical history of  adrenal disease, bipolar disorder, diabetes, hyperlipidemia, sleep apnea and spinal stenosis. Psychiatry was consulted for suicidal statments by [name], MD.     He does carry the diagnosis of bipolar disorder vs schizoaffective disorder , and has a history of pill seeking behavior. Current outpatient psychotropic medications include alprazolam, adderall, buproprion, depakote, prozac, prazosin, vyvanse, zolpidem and historically he has had a poor response to these medications; family has significant concern for overall decompensation over past 2-3 years and overmedication/pill-seeking behavior.    He was compliant with medications prior to admission. On initial examination, patient was unable to engage in a nuanced discussion of medications due to his delirium. Overall we are working towards minimizing deliriogenic meds. Please see plan below for detailed recommendations.    10/11: pt became hyperfocused on short acting xanax not covering anxiety (gets 3 mg ER in AM at home). Not amenable to discussion that he is getting IR TID, eventually agreeable to transition to ativan at roughly the BZD equivalent dose. Balancing respiratory  suppression with anxiety difficult in this pt on chronic BZD, stimulant, Z drug and opioids.    10/12: pt seems more delirious, many nonsensical responses to questions. Called steroids "suicide pills" and mom confirmed these have caused psychiatric destabilization in past. Pt assented and mom consented to olanzapine while on steroids. Specifically discussed risk of worsening tardive dyskinesia; feel olanzapine better agent than depakote in setting of significant malnutrition and hypolabuminemia. Did not discuss BZD today - no intention to further taper until later this week, discussion significantly agitated pt yesterday, pt not currently able to have full r/b/se discussion.    10/13: marginally improved. Continues to make suicidal statements which seem provocative in nature and not in line with pt's overall reported mood - continues to have residual delirium and occasionally pulls at lines, NG tube, etc - will continue sitter for now   10/14: continues with delirium, able to joke some today. A little less oriented. Denied SI today.    10/18- Patient continues to deny SI.  Patient continues to have trouble with orientation today he was oriented to self and hospital but did not know the city or state.  Patient also had trouble with the month.  Patient reports that he will try to better communicate with nursing staff when he is in pain rather than telling them he plans to jump out of the window when he is having pain.  Patient endorsed that he does plan to continue living and has not had SI in a few days.  Patient does continue to present with some symptoms concerning for delirium as he continues to have a difficult time remaining oriented.  We will decrease Wellbutrin dosage as it is a pro dopaminergic medication and patient is already taking Prozac.  The  goal is to decrease Pro dopaminergic medications in an attempt to relieve symptoms concerning for delirium.  Labs evaluated-Depakote level: 43  (subtherapeutic in general population) Patient albumin level low (2.0) we will recommend repeat CMP.  If albumin level remains low we will not adjust Depakote dosage as it would be appropriate for patient with low levels of protein for medication to bind to.  Plan  ## Safety and Observation Level:  - Based on my clinical evaluation, I estimate the patient to be at moderate  risk of self harm in the current setting from pulling at lines, tubes, etc - continue sitter (no longer recommending for SI, can be d/c if delirium improves)   ## Medications:  --Standing: CONTINUE:              - buproprion but decreased to 100 mg BID             - valproic acid 500 mg TID             - fluoxetine 40 mg qD    -Continue lorazepam to 1/1/2      CONTINUE olanzapine 5 QHS and 2.5 BID  Continue olanzapine 2.5 mg twice daily as needed, agitation  Continue to hold:              - zolpidem, home stimulants     We are generally making medication changes to reduce sedative burden in this patient (reducing xanax, stopping ambien).    ## Medical Decision Making Capacity:  Not specifically assessed   ## Further Work-up:  -- Recommend repeat CMP   ## Disposition:  --  no psychiatric contraindications to discharge at this time   ## Behavioral / Environmental:  -- Continue delirium precautions     Thank you for this consult request. Recommendations have been communicated to the primary team.  We will continue to follow at this time.    Subjective:   Edward Garner is a 55 y.o. male patient admitted with aspiration pneumonia after a spinal procedure. He carries the psychiatric diagnoses of schizoaffective disorder vs bipolar disorder (per mom), ADHD and anxiety and has a past medical history of  adrenal disease, bipolar disorder, diabetes, hyperlipidemia, sleep apnea and spinal stenosis.  HPI: On assessment today patient endorses that he is not having any SI nor HI nor AH.  Patient does report  that he has been having some VH and endorses seeing "some Fisher there" pointing towards his window.  Patient also reports that he has been having some headaches but feels that they are appropriately managed with his current medication regimen for pain.  Patient endorses that he may not have been communicating his pain appropriately to nursing staff in the past and reports that he will try not to endorse thoughts of suicidality when he does not really mean this, when he is actually in pain.   Past Medical History:  Past Medical History:  Diagnosis Date   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HCC)    Bipolar disorder (HCC)    Complication of anesthesia    Diabetes mellitus without complication (HCC)    Hyperlipidemia    Hypertension    Kidney disease    PONV (postoperative nausea and vomiting)    Sleep apnea    Sleep apnea, unspecified 10/19/2018   Spinal stenosis     Past Surgical History:  Procedure Laterality Date   ANTERIOR CERVICAL DECOMP/DISCECTOMY FUSION N/A 10/16/2020   Procedure: ACDF - C3-C4 - C4-C5 - C5-C6;  Surgeon: [name]  S, MD;  Location: [facility] OR;  Service: Neurosurgery;  Laterality: N/A;   TONSILLECTOMY     Family History:  Family History  Problem Relation Age of Onset   Non-Hodgkin's lymphoma Mother    Diabetes Father    Cancer Father    Thyroid disease Father    Hypertension Father    Hyperlipidemia Father    Bipolar disorder Father    Social History:  Social History   Substance and Sexual Activity  Alcohol Use Not Currently     Social History   Substance and Sexual Activity  Drug Use Never    Social History   Socioeconomic History   Marital status: Single    Spouse name: Not on file   Number of children: Not on file   Years of education: Not on file   Highest education level: Not on file  Occupational History   Not on file  Tobacco Use   Smoking status: Former    Packs/day: 0.50    Years: 0.50    Pack years: 0.25    Types: Cigarettes    Quit date: 1996     Years since quitting: 26.8   Smokeless tobacco: Former    Types: Snuff  Vaping Use   Vaping Use: Never used  Substance and Sexual Activity   Alcohol use: Not Currently   Drug use: Never   Sexual activity: Not on file  Other Topics Concern   Not on file  Social History Narrative   Not on file   Social Determinants of Health   Financial Resource Strain: Not on file  Food Insecurity: Not on file  Transportation Needs: Not on file  Physical Activity: Not on file  Stress: Not on file  Social Connections: Not on file   Additional Social History:    Allergies:   Allergies  Allergen Reactions   Gramineae Pollens     Labs:  Results for orders placed or performed during the hospital encounter of 10/19/20 (from the past 48 hour(s))  Glucose, capillary     Status: Abnormal   Collection Time: 10/30/20  2:28 AM  Result Value Ref Range   Glucose-Capillary 189 (H) 70 - 99 mg/dL    Comment: Glucose reference range applies only to samples taken after fasting for at least 8 hours.  Glucose, capillary     Status: Abnormal   Collection Time: 10/30/20  6:25 AM  Result Value Ref Range   Glucose-Capillary 180 (H) 70 - 99 mg/dL    Comment: Glucose reference range applies only to samples taken after fasting for at least 8 hours.  Basic metabolic panel     Status: Abnormal   Collection Time: 10/30/20  8:15 AM  Result Value Ref Range   Sodium 135 135 - 145 mmol/L   Potassium 4.1 3.5 - 5.1 mmol/L   Chloride 100 98 - 111 mmol/L   CO2 27 22 - 32 mmol/L   Glucose, Bld 103 (H) 70 - 99 mg/dL    Comment: Glucose reference range applies only to samples taken after fasting for at least 8 hours.   BUN 16 6 - 20 mg/dL   Creatinine, Ser 5.64 0.61 - 1.24 mg/dL   Calcium 8.5 (L) 8.9 - 10.3 mg/dL   GFR, Estimated >33 >29 mL/min    Comment: (NOTE) Calculated using the CKD-EPI Creatinine Equation (2021)    Anion gap 8 5 - 15    Comment: Performed at [facility], [address],  [place], [place] [address]  Glucose, capillary     Status: None   Collection Time: 10/30/20  8:26 AM  Result Value Ref Range   Glucose-Capillary 96 70 - 99 mg/dL    Comment: Glucose reference range applies only to samples taken after fasting for at least 8 hours.   Comment 1 Notify RN    Comment 2 Document in Chart   Glucose, capillary     Status: Abnormal   Collection Time: 10/30/20 11:58 AM  Result Value Ref Range   Glucose-Capillary 196 (H) 70 - 99 mg/dL    Comment: Glucose reference range applies only to samples taken after fasting for at least 8 hours.   Comment 1 Notify RN    Comment 2 Document in Chart   Glucose, capillary     Status: Abnormal   Collection Time: 10/30/20  4:05 PM  Result Value Ref Range   Glucose-Capillary 134 (H) 70 - 99 mg/dL    Comment: Glucose reference range applies only to samples taken after fasting for at least 8 hours.   Comment 1 Notify RN    Comment 2 Document in Chart   Glucose, capillary     Status: Abnormal   Collection Time: 10/30/20  9:22 PM  Result Value Ref Range   Glucose-Capillary 208 (H) 70 - 99 mg/dL    Comment: Glucose reference range applies only to samples taken after fasting for at least 8 hours.  Glucose, capillary     Status: Abnormal   Collection Time: 10/30/20 11:59 PM  Result Value Ref Range   Glucose-Capillary 153 (H) 70 - 99 mg/dL    Comment: Glucose reference range applies only to samples taken after fasting for at least 8 hours.  Valproic acid level     Status: Abnormal   Collection Time: 10/31/20  3:19 AM  Result Value Ref Range   Valproic Acid Lvl 43 (L) 50.0 - 100.0 ug/mL    Comment: Performed at [facility], [address], [place], [place] [address]  Glucose, capillary     Status: Abnormal   Collection Time: 10/31/20  4:27 AM  Result Value Ref Range   Glucose-Capillary 187 (H) 70 - 99 mg/dL    Comment: Glucose reference range applies only to samples taken after fasting for at least 8 hours.  Glucose, capillary      Status: Abnormal   Collection Time: 10/31/20  7:44 AM  Result Value Ref Range   Glucose-Capillary 209 (H) 70 - 99 mg/dL    Comment: Glucose reference range applies only to samples taken after fasting for at least 8 hours.  Comprehensive metabolic panel     Status: Abnormal   Collection Time: 10/31/20 11:12 AM  Result Value Ref Range   Sodium 134 (L) 135 - 145 mmol/L   Potassium 4.1 3.5 - 5.1 mmol/L   Chloride 100 98 - 111 mmol/L   CO2 26 22 - 32 mmol/L   Glucose, Bld 271 (H) 70 - 99 mg/dL    Comment: Glucose reference range applies only to samples taken after fasting for at least 8 hours.   BUN 19 6 - 20 mg/dL   Creatinine, Ser 6.94 0.61 - 1.24 mg/dL   Calcium 8.8 (L) 8.9 - 10.3 mg/dL   Total Protein 6.1 (L) 6.5 - 8.1 g/dL   Albumin 1.9 (L) 3.5 - 5.0 g/dL   AST 28 15 - 41 U/L   ALT 42 0 - 44 U/L   Alkaline Phosphatase 84 38 - 126 U/L   Total  Bilirubin 0.3 0.3 - 1.2 mg/dL   GFR, Estimated >83 >15 mL/min    Comment: (NOTE) Calculated using the CKD-EPI Creatinine Equation (2021)    Anion gap 8 5 - 15    Comment: Performed at [facility], [address], [place], [place] [address]  Ammonia     Status: None   Collection Time: 10/31/20 11:12 AM  Result Value Ref Range   Ammonia 25 9 - 35 umol/L    Comment: Performed at [facility], [address] [place], [place] [address]  Glucose, capillary     Status: Abnormal   Collection Time: 10/31/20 12:11 PM  Result Value Ref Range   Glucose-Capillary 179 (H) 70 - 99 mg/dL    Comment: Glucose reference range applies only to samples taken after fasting for at least 8 hours.    Current Facility-Administered Medications  Medication Dose Route Frequency Provider Last Rate Last Admin   buPROPion (WELLBUTRIN) tablet 100 mg  100 mg Per Tube BID Eliseo Gum B, MD       docusate (COLACE) 50 MG/5ML liquid 100 mg  100 mg Per Tube BID PRN [name], MD       enoxaparin (LOVENOX) injection 40 mg  40 mg Subcutaneous Daily  Hughie Closs, MD   40 mg at 10/31/20 0904   famotidine (PEPCID) tablet 20 mg  20 mg Per Tube BID [name], MD   20 mg at 10/31/20 0626   feeding supplement (JEVITY 1.5 CAL/FIBER) liquid 1,000 mL  1,000 mL Per Tube Continuous [name], MD 65 mL/hr at 10/31/20 0902 1,000 mL at 10/31/20 0902   feeding supplement (PROSource TF) liquid 45 mL  45 mL Per Tube BID [name], MD   45 mL at 10/31/20 0903   FLUoxetine (PROZAC) 20 MG/5ML solution 40 mg  40 mg Per Tube Daily [name], MD   40 mg at 10/31/20 9485   free water 200 mL  200 mL Per Tube Q4H Regalado, Belkys A, MD   200 mL at 10/31/20 1141   hydrALAZINE (APRESOLINE) injection 10 mg  10 mg Intravenous Q4H PRN Tobey Grim, NP       HYDROmorphone (DILAUDID) injection 0.5 mg  0.5 mg Intravenous Q4H PRN Regalado, Belkys A, MD   0.5 mg at 10/31/20 1240   insulin aspart (novoLOG) injection 0-15 Units  0-15 Units Subcutaneous Q4H Tobey Grim, NP   3 Units at 10/31/20 1335   insulin glargine-yfgn (SEMGLEE) injection 50 Units  50 Units Subcutaneous Daily Pahwani, Daleen Bo, MD   50 Units at 10/31/20 0906   ipratropium-albuterol (DUONEB) 0.5-2.5 (3) MG/3ML nebulizer solution 3 mL  3 mL Nebulization Q6H PRN Mikey College T, MD       labetalol (NORMODYNE) injection 10 mg  10 mg Intravenous Q2H PRN Tobey Grim, NP       lip balm (CARMEX) ointment   Topical PRN Rica Mote, MD       LORazepam (ATIVAN) injection 1 mg  1 mg Intravenous BID Hughie Closs, MD   1 mg at 10/31/20 1356   And   LORazepam (ATIVAN) injection 2 mg  2 mg Intravenous Q24H Hughie Closs, MD   2 mg at 10/30/20 2134   multivitamin with minerals tablet 1 tablet  1 tablet Per Tube Daily [name], MD   1 tablet at 10/31/20 0904   OLANZapine (ZYPREXA) tablet 2.5 mg  2.5 mg Per Tube BID PRN  Cinderella, Margaret A   2.5 mg at 10/28/20 0121   OLANZapine (ZYPREXA) tablet 2.5 mg  2.5 mg Oral Daily Maryagnes Amos, FNP   2.5 mg at  10/31/20 2876   And   OLANZapine (ZYPREXA) tablet 5 mg  5 mg Oral QHS Maryagnes Amos, FNP   5 mg at 10/30/20 2138   ondansetron (ZOFRAN) injection 4 mg  4 mg Intravenous Q6H PRN Mikey College T, MD       oxyCODONE-acetaminophen (PERCOCET/ROXICET) 5-325 MG per tablet 1-2 tablet  1-2 tablet Per Tube Q6H PRN Regalado, Belkys A, MD   2 tablet at 10/29/20 1112   polyethylene glycol (MIRALAX / GLYCOLAX) packet 17 g  17 g Per Tube Daily PRN [name], MD   17 g at 10/25/20 0950   polyethylene glycol (MIRALAX / GLYCOLAX) packet 17 g  17 g Per Tube Daily Regalado, Belkys A, MD   17 g at 10/31/20 0907   rosuvastatin (CRESTOR) tablet 20 mg  20 mg Per Tube QHS [name], MD   20 mg at 10/30/20 2138   senna-docusate (Senokot-S) tablet 1 tablet  1 tablet Per Tube BID Regalado, Belkys A, MD   1 tablet at 10/31/20 0904   valproic acid (DEPAKENE) 250 MG/5ML solution 500 mg  500 mg Per Tube TID [name], MD   500 mg at 10/31/20 8115               Psychiatric Specialty Exam:  Presentation  General Appearance: Appropriate for Environment  Eye Contact:Fair  Speech:Slurred  Speech Volume:Normal  Handedness:Right   Mood and Affect  Mood:Euthymic  Affect:Constricted   Thought Process  Thought Processes:Goal Directed  Descriptions of Associations:Circumstantial  Orientation:-- (not oriented to state or city but was aware of the hospital he was in. Was not oriented to month, but was oriented to year and name)  Thought Content:Logical  History of Schizophrenia/Schizoaffective disorder:No data recorded Duration of Psychotic Symptoms:No data recorded Hallucinations:Hallucinations: Visual Description of Visual Hallucinations: says he see's fishes over in the window near his balloons  Ideas of Reference:Delusions  Suicidal Thoughts:Suicidal Thoughts: No  Homicidal Thoughts:Homicidal Thoughts: No   Sensorium  Memory:Immediate Fair; Recent Fair; Remote  Poor  Judgment:-- (Improving)  Insight:Shallow   Executive Functions  Concentration:Fair  Attention Span:Fair  Recall:Poor  Fund of Knowledge:Fair  Language:Fair   Psychomotor Activity  Psychomotor Activity:Psychomotor Activity: Restlessness (restleness noted in BLE, no abnomral movment of mouth noted today)   Assets  Assets:Resilience; Social Support   Sleep  Sleep:Sleep: Fair   Physical Exam: Physical Exam Eyes:     Extraocular Movements: Extraocular movements intact.  Pulmonary:     Effort: Pulmonary effort is normal.  Skin:    General: Skin is warm.  Neurological:     Mental Status: He is alert. He is disoriented.   Review of Systems  Neurological:  Positive for headaches.       Says pain is well controlled with current medication regimen   Psychiatric/Behavioral:  Negative for suicidal ideas.   Blood pressure (!) 95/35, pulse (!) 107, temperature 98.6 F (37 C), temperature source Oral, resp. rate 20, height 6' (1.829 m), weight 74.5 kg, SpO2 94 %. Body mass index is 22.28 kg/m.  PGY-2 Bobbye Morton, MD 10/31/2020 4:08 PM

## 2020-10-31 NOTE — Progress Notes (Signed)
Inpatient Rehab Admissions Coordinator:  There are no beds available for this pt in CIR today.  Will continue to follow progress with therapies.   Wolfgang Phoenix, MS, CCC-SLP Admissions Coordinator [phone]

## 2020-10-31 NOTE — Progress Notes (Addendum)
PROGRESS NOTE    Edward Garner  CVE:[id number] DOB: 09/24/1965 DOA: 10/19/2020 PCP: [name], MD   Brief Narrative: 55 year old with past medical history significant for smoker, OSA, hypertension, hyperlipidemia, diabetes type 2, seizure and cervical spondylosis/stenosis status post ACDF on 10/3 presenting from home with confusion.  Patient recently underwent cervical ACDF C3 C6 with Dr. [name] on 10/3 and discharged home on 10/4.  Patient presented to the ER later that day after a syncopal episode at home with negative CT head and cervical CT, requiring scalp laceration repair.  Subsequently was discharged home.  Patient returned to the ER the day of admission due to progressive lethargy and found by EMS with oxygen saturation in the 70s, he was placed on normal breather mask and received Narcan with significant improvement.  Poor oral intake due to ongoing pain and some difficulty with swallowing. Patient was admitted with multifocal pneumonia.  CTA was negative for PE.  CT cervical which showed slightly increased prevertebral swelling compared to 10/4, P diameter of the larynx the level of the epiglottis to 5 mm.  Significant Hospital events: 10/3 cervical ACDF C3-C6 with Dr. [name], discharged home 10/4 10/4 syncope at home, ER eval neg, discharged home 10/6 Admit to PCCM for acute hypoxic respiratory failure 2/2 multifocal pna, on BiPAP 10/7 On 11L [place]  Assessment & Plan:   Active Problems:   Hypoxemia   Acute respiratory failure with hypoxia (HCC)   Malnutrition of moderate degree   Delirium due to another medical condition   Polypharmacy   Dysphagia  1-Acute Hypoxic respiratory failure in the setting of multifocal pneumonia: Concern for aspiration event with prevertebral swelling status post C-spine fusion -History of OSA on CPAP Continue with core track and n.p.o. with speech following Completed 7 days of Unasyn on 10/26/2020.  Repeat chest x-ray on 10/27/2020 shows  persistent bilateral pulmonary infiltrates.  However lungs clear to auscultation on exam and now he is off of oxygen since the night of 10/29/2020 and saturating well over 92% and is comfortable.  Reportedly, at night he dropped his oxygen saturation and required oxygen, this was likely secondary to his history of OSA.  2-Dysphagia: CT with slightly increased prevertebral swelling compared to 10/4 Continue with core track and tube feeding. Speech following.  MBS completed which shows oropharyngeal phase dysphagia and moderate aspiration risk.  Remains NPO.  SLP on board. Received IV steroids.   Hypernatremia:; Resolved.  Diabetes type 2: Slightly elevated, increase Semglee to 45 units and continue NovoLog 7 units every 4 hours and SSI.  Seizure disorder: Continue with Depakote.   Bipolar: Continue with Prozac and Wellbutrin. Holding Lyrica He reporter  to nurse last night he wants to jump out of window.. -psych recommend continue with bupropion valporic acid and Prozac. -hold adderal and Vyvasne.  -Alprazolam change to ativan.  -Psych recommend schedule pain medication, his pain is very well controlled.  With as needed medications.  Patient started on Zyprexa for steroid-induced agitation on 10/25/2020 and psychiatry does not recommend continuing that at the time of discharge.  Per psychiatry recommendation, I I reduced his Ativan to 1/1/2 milligrams IV on 10/30/2020.  Hypomagnesemia: Resolved.  Hypertension: Controlled.  Cozaar was discontinued on 10/27/2020 due to low blood pressure.  Metoprolol was also discontinued early morning of 10/31/2020 due to hypotension.  OSA: Patient uses CPAP machine at night.  Mother advised to bring the machine to hospital.  Recent C-spine fusion by Dr. [name] 10/4: Neurosurgery following   Nutrition Problem: Moderate  Malnutrition Etiology: chronic illness    Signs/Symptoms: moderate fat depletion, moderate muscle depletion    Interventions:  MVI, Tube feeding  Estimated body mass index is 22.28 kg/m as calculated from the following:   Height as of this encounter: 6' (1.829 m).   Weight as of this encounter: 74.5 kg.   DVT prophylaxis: Lovenox Code Status: Full code Family Communication: Mother at bedside Disposition Plan:  Status is: Inpatient  Remains inpatient appropriate because: Meets inpatient criteria.  Dispo: The patient is from: Home              Anticipated d/c is to: Home              Patient currently is medically stable to d/c.   Difficult to place patient No   Consultants:  Neurosurgery   Procedures:    Antimicrobials:    Subjective: Patient seen and examined.  He is fully alert and oriented and making sense in his conversation with me today.  No complaints.  Objective: Vitals:   10/31/20 0000 10/31/20 0100 10/31/20 0422 10/31/20 0746  BP: 111/63  124/79 118/66  Pulse: (!) 109 100 (!) 109 (!) 107  Resp: 19 18 20 20   Temp: 99 F (37.2 C)  98.9 F (37.2 C) 98.6 F (37 C)  TempSrc: Axillary  Oral Oral  SpO2: 97% 97% 100% 100%  Weight:      Height:        Intake/Output Summary (Last 24 hours) at 10/31/2020 1230 Last data filed at 10/31/2020 0846 Gross per 24 hour  Intake 3800 ml  Output 1900 ml  Net 1900 ml    Filed Weights   10/27/20 0443 10/28/20 0338 10/30/20 0400  Weight: 70.8 kg 70.8 kg 74.5 kg    Examination:  General exam: Appears calm and comfortable  Respiratory system: Clear to auscultation. Respiratory effort normal. Cardiovascular system: S1 & S2 heard, RRR. No JVD, murmurs, rubs, gallops or clicks. No pedal edema. Gastrointestinal system: Abdomen is nondistended, soft and nontender. No organomegaly or masses felt. Normal bowel sounds heard. Central nervous system: Alert and oriented. No focal neurological deficits. Extremities: Symmetric 5 x 5 power. Skin: No rashes, lesions or ulcers.  Psychiatry: Judgement and insight appear poor   Data Reviewed: I have  personally reviewed following labs and imaging studies  CBC: Recent Labs  Lab 10/26/20 0239 10/27/20 0402  WBC 13.7* 15.3*  NEUTROABS 10.1* 11.4*  HGB 11.2* 10.7*  HCT 34.0* 32.4*  MCV 99.1 99.7  PLT 365 338    Basic Metabolic Panel: Recent Labs  Lab 10/26/20 0239 10/27/20 0402 10/30/20 0815 10/31/20 1112  NA 146* 143 135 134*  K 4.0 3.9 4.1 4.1  CL 103 103 100 100  CO2 37* 32 27 26  GLUCOSE 199* 152* 103* 271*  BUN 21* 19 16 19   CREATININE 0.85 0.81 0.87 0.93  CALCIUM 9.2 9.2 8.5* 8.8*    GFR: Estimated Creatinine Clearance: 96.8 mL/min (by C-G formula based on SCr of 0.93 mg/dL). Liver Function Tests: Recent Labs  Lab 10/31/20 1112  AST 28  ALT 42  ALKPHOS 84  BILITOT 0.3  PROT 6.1*  ALBUMIN 1.9*    No results for input(s): LIPASE, AMYLASE in the last 168 hours. Recent Labs  Lab 10/31/20 1112  AMMONIA 25    Coagulation Profile: No results for input(s): INR, PROTIME in the last 168 hours.  Cardiac Enzymes: No results for input(s): CKTOTAL, CKMB, CKMBINDEX, TROPONINI in the last 168 hours. BNP (last 3  results) No results for input(s): PROBNP in the last 8760 hours. HbA1C: No results for input(s): HGBA1C in the last 72 hours. CBG: Recent Labs  Lab 10/30/20 2122 10/30/20 2359 10/31/20 0427 10/31/20 0744 10/31/20 1211  GLUCAP 208* 153* 187* 209* 179*    Lipid Profile: No results for input(s): CHOL, HDL, LDLCALC, TRIG, CHOLHDL, LDLDIRECT in the last 72 hours. Thyroid Function Tests: No results for input(s): TSH, T4TOTAL, FREET4, T3FREE, THYROIDAB in the last 72 hours. Anemia Panel: No results for input(s): VITAMINB12, FOLATE, FERRITIN, TIBC, IRON, RETICCTPCT in the last 72 hours. Sepsis Labs: Recent Labs  Lab 10/27/20 0402  PROCALCITON 0.26     No results found for this or any previous visit (from the past 240 hour(s)).         Radiology Studies: No results found.   Scheduled Meds:  buPROPion  100 mg Per Tube BID    enoxaparin (LOVENOX) injection  40 mg Subcutaneous Daily   famotidine  20 mg Per Tube BID   feeding supplement (PROSource TF)  45 mL Per Tube BID   FLUoxetine  40 mg Per Tube Daily   free water  200 mL Per Tube Q4H   insulin aspart  0-15 Units Subcutaneous Q4H   insulin glargine-yfgn  50 Units Subcutaneous Daily   LORazepam  1 mg Intravenous BID   And   LORazepam  2 mg Intravenous Q24H   multivitamin with minerals  1 tablet Per Tube Daily   OLANZapine  2.5 mg Oral Daily   And   OLANZapine  5 mg Oral QHS   polyethylene glycol  17 g Per Tube Daily   rosuvastatin  20 mg Per Tube QHS   senna-docusate  1 tablet Per Tube BID   valproic acid  500 mg Per Tube TID   Continuous Infusions:  feeding supplement (JEVITY 1.5 CAL/FIBER) 1,000 mL (10/31/20 0902)     LOS: 12 days   Time spent: 25 minutes.   Hughie Closs, MD [facility]  If 7PM-7AM, please contact night-coverage [email or web address]  10/31/2020, 12:30 PM

## 2020-10-31 NOTE — Progress Notes (Signed)
Speech Language Pathology Treatment: Dysphagia  Patient Details Name: Foxx Klarich MRN: [id number] DOB: 12/20/1965 Today's Date: 10/31/2020 Time: 9563-8756 SLP Time Calculation (min) (ACUTE ONLY): 10 min  Assessment / Plan / Recommendation Clinical Impression  No functional improvements noted in swallowing since yesterday's session - pt communicative, remains confused, sitting in recliner. With physical assist, he drank water with ongoing immediate and explosive coughing.  Multiple sub-swallows are observed with each bolus, suggesting residue. Occasional verbal cues needed, primarily for attention and safety.  Dysphagia persists.  Recommend repeating MBS on Thursday, 10/20 to determine any changes in physiology and plan for nutrition.  D/W pt's mother, who agrees with plan.    HPI HPI: Pt is a 55 y/o male who recently underwent cervical ACDF C3-C6 on 10/3 and discharged home on 10/4.  Patient presented to ER later that day after syncopal episode at home with negative CTH and cervical CT and required scalp laceration repair.  No other complaints, normal ddimer and hemodynamics and therefore discharged home. Pt returned to tje ER again for progressive lethargy at home and was found by EMS with O2 saturations in the 70's, placed on NRB and no improvement to narcan.  Additionally reported decrease oral intake due to ongoing pain and some difficulty swallowing, CT cervical showed slightly increased prevertebral swelling compared to 10/4 and narrowed AP diameter of the larynx the level of the epiglottis to 5 mm, CTH negative for acute intracranial process, and CTA PE was negative for PE, but showed multifocal pneumonia and thickening of esophageal wall possibly reflecting esophagitis, and hepatomegaly.      SLP Plan  MBS      Recommendations for follow up therapy are one component of a multi-disciplinary discharge planning process, led by the attending physician.  Recommendations may be  updated based on patient status, additional functional criteria and insurance authorization.    Recommendations  Diet recommendations: NPO (ice chips) Medication Administration: Via alternative means                Oral Care Recommendations: Oral care QID;Oral care prior to ice chip/H20 Follow up Recommendations:  (tbd) Plan: MBS       GO              Gwynne Kemnitz L. Samson Frederic, MA CCC/SLP Acute Rehabilitation Services Office number [phone] Pager [phone]   Blenda Mounts Laurice  10/31/2020, 2:48 PM

## 2020-10-31 NOTE — Progress Notes (Signed)
Confused today, but conversant. MAEx4. No swelling at surgical site, incision good. Unclear to me why he can't swallow now 2 wks post-op. Last xray noted "no significant prevertebral edema" and that was 1 week ago. Maybe mental status is playing a role.
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
## 2020-11-01 ENCOUNTER — Inpatient Hospital Stay (HOSPITAL_COMMUNITY): Payer: Medicare Other

## 2020-11-01 DIAGNOSIS — J9601 Acute respiratory failure with hypoxia: Secondary | ICD-10-CM | POA: Diagnosis not present

## 2020-11-01 LAB — CBC WITH DIFFERENTIAL/PLATELET
Abs Immature Granulocytes: 0.06 10*3/uL (ref 0.00–0.07)
Basophils Absolute: 0 10*3/uL (ref 0.0–0.1)
Basophils Relative: 0 %
Eosinophils Absolute: 0.1 10*3/uL (ref 0.0–0.5)
Eosinophils Relative: 1 %
HCT: 35.2 % — ABNORMAL LOW (ref 39.0–52.0)
Hemoglobin: 11.4 g/dL — ABNORMAL LOW (ref 13.0–17.0)
Immature Granulocytes: 1 %
Lymphocytes Relative: 19 %
Lymphs Abs: 1.8 10*3/uL (ref 0.7–4.0)
MCH: 31.3 pg (ref 26.0–34.0)
MCHC: 32.4 g/dL (ref 30.0–36.0)
MCV: 96.7 fL (ref 80.0–100.0)
Monocytes Absolute: 1.9 10*3/uL — ABNORMAL HIGH (ref 0.1–1.0)
Monocytes Relative: 20 %
Neutro Abs: 5.7 10*3/uL (ref 1.7–7.7)
Neutrophils Relative %: 59 %
Platelets: 546 10*3/uL — ABNORMAL HIGH (ref 150–400)
RBC: 3.64 MIL/uL — ABNORMAL LOW (ref 4.22–5.81)
RDW: 13 % (ref 11.5–15.5)
WBC: 9.5 10*3/uL (ref 4.0–10.5)
nRBC: 0 % (ref 0.0–0.2)

## 2020-11-01 LAB — GLUCOSE, CAPILLARY
Glucose-Capillary: 142 mg/dL — ABNORMAL HIGH (ref 70–99)
Glucose-Capillary: 148 mg/dL — ABNORMAL HIGH (ref 70–99)
Glucose-Capillary: 185 mg/dL — ABNORMAL HIGH (ref 70–99)
Glucose-Capillary: 186 mg/dL — ABNORMAL HIGH (ref 70–99)
Glucose-Capillary: 193 mg/dL — ABNORMAL HIGH (ref 70–99)
Glucose-Capillary: 194 mg/dL — ABNORMAL HIGH (ref 70–99)
Glucose-Capillary: 217 mg/dL — ABNORMAL HIGH (ref 70–99)

## 2020-11-01 LAB — URINALYSIS, ROUTINE W REFLEX MICROSCOPIC
Bilirubin Urine: NEGATIVE
Glucose, UA: 50 mg/dL — AB
Hgb urine dipstick: NEGATIVE
Ketones, ur: NEGATIVE mg/dL
Leukocytes,Ua: NEGATIVE
Nitrite: NEGATIVE
Protein, ur: NEGATIVE mg/dL
Specific Gravity, Urine: 1.018 (ref 1.005–1.030)
pH: 7 (ref 5.0–8.0)

## 2020-11-01 MED ORDER — INSULIN GLARGINE-YFGN 100 UNIT/ML ~~LOC~~ SOLN
30.0000 [IU] | Freq: Two times a day (BID) | SUBCUTANEOUS | Status: DC
  Administered 2020-11-01 – 2020-11-02 (×2): 30 [IU] via SUBCUTANEOUS
  Filled 2020-11-01 (×5): qty 0.3

## 2020-11-01 NOTE — Progress Notes (Signed)
PROGRESS NOTE    Edward Garner  HGD:[id number] DOB: 04/27/1965 DOA: 10/19/2020 PCP: [name], MD   Brief Narrative: 55 year old with past medical history significant for smoker, OSA, hypertension, hyperlipidemia, diabetes type 2, seizure and cervical spondylosis/stenosis status post ACDF on 10/3 presenting from home with confusion.  Patient recently underwent cervical ACDF C3 C6 with Dr. [name] on 10/3 and discharged home on 10/4.  Patient presented to the ER later that day after a syncopal episode at home with negative CT head and cervical CT, requiring scalp laceration repair.  Subsequently was discharged home.  Patient returned to the ER the day of admission due to progressive lethargy and found by EMS with oxygen saturation in the 70s, he was placed on normal breather mask and received Narcan with significant improvement.  Poor oral intake due to ongoing pain and some difficulty with swallowing. Patient was admitted with multifocal pneumonia.  CTA was negative for PE.  CT cervical which showed slightly increased prevertebral swelling compared to 10/4, P diameter of the larynx the level of the epiglottis to 5 mm.  Significant Hospital events: 10/3 cervical ACDF C3-C6 with Dr. [name], discharged home 10/4 10/4 syncope at home, ER eval neg, discharged home 10/6 Admit to PCCM for acute hypoxic respiratory failure 2/2 multifocal pna, on BiPAP 10/7 On 11L [place]  Assessment & Plan:   Active Problems:   Hypoxemia   Acute respiratory failure with hypoxia (HCC)   Malnutrition of moderate degree   Delirium due to another medical condition   Polypharmacy   Dysphagia  1-Acute Hypoxic respiratory failure in the setting of multifocal pneumonia: Concern for aspiration event with prevertebral swelling status post C-spine fusion -History of OSA on CPAP Continue with core track and n.p.o. with speech following Completed 7 days of Unasyn on 10/26/2020.  Repeat chest x-ray on 10/27/2020 shows  persistent bilateral pulmonary infiltrates.  However lungs clear to auscultation on exam and now he is off of oxygen since the night of 10/29/2020 and saturating well over 92% and is comfortable.  I was informed by the nurses that his temperature spike to 102 this morning just 5 minutes before I arrived.  On my examination, patient was not warm for a fever of 102.  Temperature was rechecked per my request and it was 100.3.  Patient was comfortable.  2-Dysphagia: CT with slightly increased prevertebral swelling compared to 10/4 Continue with core track and tube feeding. Speech following.  MBS completed which shows oropharyngeal phase dysphagia and moderate aspiration risk.  Remains NPO.  SLP on board.  Per neurosurgery, his surgery happened more than 2 weeks ago and this should not be the reason for his dysphagia.  SLP is doing another MBS tomorrow morning. Received IV steroids.   Hypernatremia:; Resolved.  Diabetes type 2: Still elevated.  Will need to increase Semglee from 50units to 60 units but I would split the dose to 30 units twice daily and continue SSI.  Seizure disorder: Continue with Depakote.   Bipolar: Continue with Prozac and Wellbutrin. Holding Lyrica He reporter  to nurse last night he wants to jump out of window.. -psych recommend continue with bupropion valporic acid and Prozac. -hold adderal and Vyvasne.  -Alprazolam change to ativan.  -Psych recommend schedule pain medication, his pain is very well controlled.  With as needed medications.  Patient started on Zyprexa for steroid-induced agitation on 10/25/2020 and psychiatry does not recommend continuing that at the time of discharge.  Per psychiatry recommendation, I I reduced his Ativan  to 1/1/2 milligrams IV on 10/30/2020.  Hypomagnesemia: Resolved.  Hypertension: Labile.  Cozaar was discontinued on 10/27/2020 due to low blood pressure.  Metoprolol was also discontinued early morning of 10/31/2020 due to  hypotension.  OSA: Patient uses CPAP machine at night.  Mother brought the machine to the hospital.  Per respiratory therapist note, patient did not need any BiPAP at night since he was resting comfortably.  Recent C-spine fusion by Dr. [name] 10/4: Neurosurgery following   Nutrition Problem: Moderate Malnutrition Etiology: chronic illness    Signs/Symptoms: moderate fat depletion, moderate muscle depletion    Interventions: MVI, Tube feeding  Estimated body mass index is 22.28 kg/m as calculated from the following:   Height as of this encounter: 6' (1.829 m).   Weight as of this encounter: 74.5 kg.   DVT prophylaxis: Lovenox Code Status: Full code Family Communication: Mother at bedside Disposition Plan:  Status is: Inpatient  Remains inpatient appropriate because: Meets inpatient criteria.  Dispo: The patient is from: Home              Anticipated d/c is to: Home              Patient currently is medically stable to d/c.   Difficult to place patient No   Consultants:  Neurosurgery   Procedures:    Antimicrobials:    Subjective: Patient seen and examined.  Mother, sister and primary RN at the bedside.  Patient had no complaint however he was slightly confused.  He thought he was in psychiatrist office.  He was able to answer rest of the questions appropriately.  He is even says " they all think I am confused but I think they are confused"  Objective: Vitals:   11/01/20 0000 11/01/20 0142 11/01/20 0820 11/01/20 0845  BP: (!) 88/52 103/64 (!) 112/54   Pulse:   (!) 106   Resp: 20 20 16    Temp: 97.9 F (36.6 C)  (!) 102 F (38.9 C) 100.3 F (37.9 C)  TempSrc: Oral  Oral Oral  SpO2: 95% 96% 94%   Weight:      Height:        Intake/Output Summary (Last 24 hours) at 11/01/2020 1049 Last data filed at 11/01/2020 0700 Gross per 24 hour  Intake --  Output 1425 ml  Net -1425 ml    Filed Weights   10/27/20 0443 10/28/20 0338 10/30/20 0400  Weight: 70.8  kg 70.8 kg 74.5 kg    Examination:  General exam: Appears calm and comfortable  Respiratory system: Clear to auscultation. Respiratory effort normal. Cardiovascular system: S1 & S2 heard, RRR. No JVD, murmurs, rubs, gallops or clicks. No pedal edema. Gastrointestinal system: Abdomen is nondistended, soft and nontender. No organomegaly or masses felt. Normal bowel sounds heard. Central nervous system: Alert and oriented. No focal neurological deficits. Extremities: Symmetric 5 x 5 power. Skin: No rashes, lesions or ulcers.  Psychiatry: Judgement and insight appear poor  Data Reviewed: I have personally reviewed following labs and imaging studies  CBC: Recent Labs  Lab 10/26/20 0239 10/27/20 0402  WBC 13.7* 15.3*  NEUTROABS 10.1* 11.4*  HGB 11.2* 10.7*  HCT 34.0* 32.4*  MCV 99.1 99.7  PLT 365 338    Basic Metabolic Panel: Recent Labs  Lab 10/26/20 0239 10/27/20 0402 10/30/20 0815 10/31/20 1112  NA 146* 143 135 134*  K 4.0 3.9 4.1 4.1  CL 103 103 100 100  CO2 37* 32 27 26  GLUCOSE 199* 152* 103* 271*  BUN 21* 19 16 19   CREATININE 0.85 0.81 0.87 0.93  CALCIUM 9.2 9.2 8.5* 8.8*    GFR: Estimated Creatinine Clearance: 96.8 mL/min (by C-G formula based on SCr of 0.93 mg/dL). Liver Function Tests: Recent Labs  Lab 10/31/20 1112  AST 28  ALT 42  ALKPHOS 84  BILITOT 0.3  PROT 6.1*  ALBUMIN 1.9*    No results for input(s): LIPASE, AMYLASE in the last 168 hours. Recent Labs  Lab 10/31/20 1112  AMMONIA 25    Coagulation Profile: No results for input(s): INR, PROTIME in the last 168 hours.  Cardiac Enzymes: No results for input(s): CKTOTAL, CKMB, CKMBINDEX, TROPONINI in the last 168 hours. BNP (last 3 results) No results for input(s): PROBNP in the last 8760 hours. HbA1C: No results for input(s): HGBA1C in the last 72 hours. CBG: Recent Labs  Lab 10/31/20 1625 10/31/20 2017 11/01/20 0015 11/01/20 0404 11/01/20 0821  GLUCAP 203* 149* 186* 194* 217*     Lipid Profile: No results for input(s): CHOL, HDL, LDLCALC, TRIG, CHOLHDL, LDLDIRECT in the last 72 hours. Thyroid Function Tests: No results for input(s): TSH, T4TOTAL, FREET4, T3FREE, THYROIDAB in the last 72 hours. Anemia Panel: No results for input(s): VITAMINB12, FOLATE, FERRITIN, TIBC, IRON, RETICCTPCT in the last 72 hours. Sepsis Labs: Recent Labs  Lab 10/27/20 0402  PROCALCITON 0.26     No results found for this or any previous visit (from the past 240 hour(s)).         Radiology Studies: No results found.   Scheduled Meds:  buPROPion  100 mg Per Tube BID   enoxaparin (LOVENOX) injection  40 mg Subcutaneous Daily   famotidine  20 mg Per Tube BID   feeding supplement (PROSource TF)  45 mL Per Tube BID   FLUoxetine  40 mg Per Tube Daily   free water  200 mL Per Tube Q4H   insulin aspart  0-15 Units Subcutaneous Q4H   insulin glargine-yfgn  30 Units Subcutaneous BID   LORazepam  1 mg Intravenous BID   And   LORazepam  2 mg Intravenous Q24H   multivitamin with minerals  1 tablet Per Tube Daily   OLANZapine  2.5 mg Oral Daily   And   OLANZapine  5 mg Oral QHS   polyethylene glycol  17 g Per Tube Daily   rosuvastatin  20 mg Per Tube QHS   senna-docusate  1 tablet Per Tube BID   valproic acid  500 mg Per Tube TID   Continuous Infusions:  feeding supplement (JEVITY 1.5 CAL/FIBER) 1,000 mL (10/31/20 0902)     LOS: 13 days   Time spent: 26 minutes.   11/02/20, MD [facility]  If 7PM-7AM, please contact night-coverage [email or web address]  11/01/2020, 10:49 AM

## 2020-11-01 NOTE — Progress Notes (Signed)
Physical Therapy Treatment Patient Details Name: Edward Garner MRN: [id number] DOB: December 25, 1965 Today's Date: 11/01/2020   History of Present Illness Pt is a 55 y.o. male admitted 10/19/20 with AMS, lethargy, hypoxia; workup for multifocal PNA. Head CT negative for acute abnormality. Of note, recent admission 10/3-10/4/22 for C3-6 ACDF; brief ER visit 10/4 for syncope. Other PMH includes DM, HTN, HLD, OSA, seizure, schizoaffective disorder.    PT Comments    Pt tolerates treatment well but remains impulsive during session, raising significant concerns for patient safety. Pt attempts to sit when ambulating in hallways despite no surface to sit on, requiring PT cues and physical assistance to prevent fall. Pt is able to ambulate for increased distances at this time and reports no orthostatic symptoms during session. Pt will benefit from continued aggressive mobilization to improve activity tolerance and reduce falls risk. PT continues to recommend high intensity inpatient PT services.   Recommendations for follow up therapy are one component of a multi-disciplinary discharge planning process, led by the attending physician.  Recommendations may be updated based on patient status, additional functional criteria and insurance authorization.  Follow Up Recommendations  CIR;Supervision for mobility/OOB     Equipment Recommendations  Rolling walker with 5" wheels    Recommendations for Other Services       Precautions / Restrictions Precautions Precautions: Cervical;Fall;Other (comment) Precaution Booklet Issued: Yes (comment) Precaution Comments: cortrak, monitor BP Required Braces or Orthoses: Cervical Brace Cervical Brace: Soft collar (when mobilizing per mother of pt) Restrictions Weight Bearing Restrictions: No     Mobility  Bed Mobility Overal bed mobility: Needs Assistance Bed Mobility: Rolling;Sidelying to Sit;Sit to Sidelying Rolling: Min assist Sidelying to sit: Min  assist     Sit to sidelying: Min assist General bed mobility comments: pt often attempting to impulsively return to supine during session    Transfers Overall transfer level: Needs assistance Equipment used: Rolling walker (2 wheeled) Transfers: Sit to/from Stand Sit to Stand: Min guard         General transfer comment: verbal cues for hand placement. Pt does attempt to start to sit when ambulating despite note being near chair or surface to sit on, requires physical assistance to cue for standing until sitter is able to bring recliner.  Ambulation/Gait Ambulation/Gait assistance: Min assist;+2 safety/equipment (chair follow) Gait Distance (Feet): 25 Feet (additional trial of 8') Assistive device: Rolling walker (2 wheeled) Gait Pattern/deviations: Step-to pattern Gait velocity: reduced Gait velocity interpretation: <1.8 ft/sec, indicate of risk for recurrent falls General Gait Details: pt wth slowed step-to gait, cues to maintain RW closer to BOS. Pt attempting to sit impulsively during session, chair follow beneficial for safety   Stairs             Wheelchair Mobility    Modified Rankin (Stroke Patients Only)       Balance Overall balance assessment: Needs assistance Sitting-balance support: No upper extremity supported;Feet supported Sitting balance-Leahy Scale: Fair Sitting balance - Comments: static sitting maintained without UE support, pt is impulsive and attempts to lay down multiple times requiring cues for safety   Standing balance support: Bilateral upper extremity supported Standing balance-Leahy Scale: Poor Standing balance comment: benefits from UE support of RW                            Cognition Arousal/Alertness: Awake/alert Behavior During Therapy: Impulsive Overall Cognitive Status: Impaired/Different from baseline Area of Impairment: Orientation;Attention;Memory;Following commands;Safety/judgement;Problem solving;Awareness  Orientation Level: Disoriented to;Place;Situation;Time Current Attention Level: Focused Memory: Decreased recall of precautions;Decreased short-term memory Following Commands: Follows one step commands consistently;Follows multi-step commands inconsistently Safety/Judgement: Decreased awareness of safety;Decreased awareness of deficits Awareness: Intellectual Problem Solving: Difficulty sequencing;Requires verbal cues;Requires tactile cues General Comments: tangential thought      Exercises      General Comments General comments (skin integrity, edema, etc.): BP 108/44 (68) sitting, BP attempted in standing however monitor battery died during session. Pt reports no symptoms. BP at end of mobility 105/60 in supine.      Pertinent Vitals/Pain Pain Assessment: Faces Faces Pain Scale: Hurts little more Pain Location: neck Pain Descriptors / Indicators: Grimacing Pain Intervention(s): Monitored during session    Home Living                      Prior Function            PT Goals (current goals can now be found in the care plan section) Acute Rehab PT Goals Patient Stated Goal: get something to drink Progress towards PT goals: Progressing toward goals    Frequency    Min 3X/week      PT Plan Current plan remains appropriate    Co-evaluation              AM-PAC PT "6 Clicks" Mobility   Outcome Measure  Help needed turning from your back to your side while in a flat bed without using bedrails?: A Little Help needed moving from lying on your back to sitting on the side of a flat bed without using bedrails?: A Little Help needed moving to and from a bed to a chair (including a wheelchair)?: A Little Help needed standing up from a chair using your arms (e.g., wheelchair or bedside chair)?: A Little Help needed to walk in hospital room?: A Lot Help needed climbing 3-5 steps with a railing? : Total 6 Click Score: 15    End of Session  Equipment Utilized During Treatment: Gait belt Activity Tolerance: Patient tolerated treatment well Patient left: in bed;with call bell/phone within reach;with bed alarm set;with family/visitor present;with nursing/sitter in room Nurse Communication: Mobility status PT Visit Diagnosis: Other abnormalities of gait and mobility (R26.89);History of falling (Z91.81);Difficulty in walking, not elsewhere classified (R26.2);Muscle weakness (generalized) (M62.81)     Time: 4098-1191 PT Time Calculation (min) (ACUTE ONLY): 35 min  Charges:  $Gait Training: 8-22 mins $Therapeutic Activity: 8-22 mins                     Arlyss Gandy, PT, DPT Acute Rehabilitation Pager: [phone] Office [phone]    Arlyss Gandy 11/01/2020, 4:05 PM

## 2020-11-01 NOTE — Progress Notes (Signed)
Subjective: Patient reports some pain in Lchest and side. Mom reports fever and confusion  Objective: Vital signs in last 24 hours: Temp:  [97.9 F (36.6 C)-102 F (38.9 C)] 100.3 F (37.9 C) (10/19 0845) Pulse Rate:  [104-106] 106 (10/19 0820) Resp:  [16-23] 16 (10/19 0820) BP: (82-112)/(35-64) 112/54 (10/19 0820) SpO2:  [91 %-97 %] 94 % (10/19 0820)  Intake/Output from previous day: 10/18 0701 - 10/19 0700 In: -  Out: 1925 [Urine:1925] Intake/Output this shift: No intake/output data recorded.  No appreciable selling at surgical site, voice strong, [email or web address]  Lab Results: Lab Results  Component Value Date   WBC 15.3 (H) 10/27/2020   HGB 10.7 (L) 10/27/2020   HCT 32.4 (L) 10/27/2020   MCV 99.7 10/27/2020   PLT 338 10/27/2020   Lab Results  Component Value Date   INR 1.0 10/16/2020   BMET Lab Results  Component Value Date   NA 134 (L) 10/31/2020   K 4.1 10/31/2020   CL 100 10/31/2020   CO2 26 10/31/2020   GLUCOSE 271 (H) 10/31/2020   BUN 19 10/31/2020   CREATININE 0.93 10/31/2020   CALCIUM 8.8 (L) 10/31/2020    Studies/Results: No results found.  Assessment/Plan: Fever w/u Swallow eval tomorrow - unclear etiology, never seen this 2 wks after acdf Lat c Pt/ot  Estimated body mass index is 22.28 kg/m as calculated from the following:   Height as of this encounter: 6' (1.829 m).   Weight as of this encounter: 74.5 kg.    LOS: 13 days    Tia Alert 11/01/2020, 10:26 AM    Some pain in

## 2020-11-02 ENCOUNTER — Inpatient Hospital Stay (HOSPITAL_COMMUNITY): Payer: Medicare Other

## 2020-11-02 DIAGNOSIS — F05 Delirium due to known physiological condition: Secondary | ICD-10-CM | POA: Diagnosis not present

## 2020-11-02 DIAGNOSIS — J9601 Acute respiratory failure with hypoxia: Secondary | ICD-10-CM | POA: Diagnosis not present

## 2020-11-02 DIAGNOSIS — Z79899 Other long term (current) drug therapy: Secondary | ICD-10-CM | POA: Diagnosis not present

## 2020-11-02 LAB — GLUCOSE, CAPILLARY
Glucose-Capillary: 203 mg/dL — ABNORMAL HIGH (ref 70–99)
Glucose-Capillary: 263 mg/dL — ABNORMAL HIGH (ref 70–99)
Glucose-Capillary: 161 mg/dL — ABNORMAL HIGH (ref 70–99)
Glucose-Capillary: 93 mg/dL (ref 70–99)
Glucose-Capillary: 119 mg/dL — ABNORMAL HIGH (ref 70–99)

## 2020-11-02 LAB — CBC WITH DIFFERENTIAL/PLATELET
Abs Immature Granulocytes: 0.03 10*3/uL (ref 0.00–0.07)
Basophils Absolute: 0.1 10*3/uL (ref 0.0–0.1)
Basophils Relative: 1 %
Eosinophils Absolute: 0.1 10*3/uL (ref 0.0–0.5)
Eosinophils Relative: 1 %
HCT: 31.4 % — ABNORMAL LOW (ref 39.0–52.0)
Hemoglobin: 10.7 g/dL — ABNORMAL LOW (ref 13.0–17.0)
Immature Granulocytes: 0 %
Lymphocytes Relative: 37 %
Lymphs Abs: 2.6 10*3/uL (ref 0.7–4.0)
MCH: 32.4 pg (ref 26.0–34.0)
MCHC: 34.1 g/dL (ref 30.0–36.0)
MCV: 95.2 fL (ref 80.0–100.0)
Monocytes Absolute: 1.5 10*3/uL — ABNORMAL HIGH (ref 0.1–1.0)
Monocytes Relative: 21 %
Neutro Abs: 2.9 10*3/uL (ref 1.7–7.7)
Neutrophils Relative %: 40 %
Platelets: 571 10*3/uL — ABNORMAL HIGH (ref 150–400)
RBC: 3.3 MIL/uL — ABNORMAL LOW (ref 4.22–5.81)
RDW: 13.1 % (ref 11.5–15.5)
WBC: 7.1 10*3/uL (ref 4.0–10.5)
nRBC: 0 % (ref 0.0–0.2)

## 2020-11-02 LAB — PROCALCITONIN: Procalcitonin: <0.1 ng/mL

## 2020-11-02 MED ORDER — LORAZEPAM 2 MG/ML IJ SOLN
1.0000 mg | Freq: Three times a day (TID) | INTRAMUSCULAR | Status: DC
  Administered 2020-11-02 – 2020-11-05 (×7): 1 mg via INTRAVENOUS
  Filled 2020-11-02 (×7): qty 1

## 2020-11-02 MED ORDER — THIAMINE HCL 100 MG/ML IJ SOLN
200.0000 mg | Freq: Every day | INTRAVENOUS | Status: AC
  Administered 2020-11-02 – 2020-11-06 (×5): 200 mg via INTRAVENOUS
  Filled 2020-11-02 (×5): qty 2

## 2020-11-02 MED ORDER — INSULIN GLARGINE-YFGN 100 UNIT/ML ~~LOC~~ SOLN
35.0000 [IU] | Freq: Two times a day (BID) | SUBCUTANEOUS | Status: DC
  Administered 2020-11-02 – 2020-11-06 (×8): 35 [IU] via SUBCUTANEOUS
  Filled 2020-11-02 (×10): qty 0.35

## 2020-11-02 MED ORDER — GLUCERNA 1.5 CAL PO LIQD
1000.0000 mL | ORAL | Status: AC
  Administered 2020-11-02 – 2020-11-09 (×7): 1000 mL
  Filled 2020-11-02 (×13): qty 1000

## 2020-11-02 NOTE — Progress Notes (Signed)
Inpatient Rehabilitation Admissions Coordinator   I met with pt's Mom to further discuss caregiver supports at home. She has arranged 24/7 care in her home after any CIR admit. SLP, Estill Bamberg has completed MBS and will discuss results with Dr Doristine Bosworth. I will further discuss case with Dr. Naaman Plummer and update acute team and [facility] of plans for rehab venue.  Danne Baxter, RN, MSN Rehab Admissions Coordinator [phone] 11/02/2020 12:38 PM

## 2020-11-02 NOTE — Progress Notes (Signed)
Modified Barium Swallow Progress Note  Patient Details  Name: Edward Garner MRN: [id number] Date of Birth: 01-11-1966  Today's Date: 11/02/2020  Modified Barium Swallow completed.  Full report located under Chart Review in the Imaging Section.  Brief recommendations include the following:  Clinical Impression  Pt presents with marginally improved swallow function, with improvements related to current mental status; however, mechanics of swallowing remain impaired with no obvious etiology. (Prevertebral edema is minimal; CT head negative for acute neuro event).  Pt demonstrates poor pharyngeal squeeze, reduced mobility of hyolaryngeal structures, incomplete epiglottic closure over larynx, and reduced traction on UES for opening. Sluggish movements of structures lead to significant residue throughout pharynx (solids > liquids) and aspiration of thin liquids, penetration of nectars, and potential of spillage of residue from solids.  Pt has good sensation and a strong cough in response to aspiration. He swallows multiple times with single teaspoon boluses in an effort to transfer material from pharynx through UES, but these sub-swallows are minimally effective and material sits in pharynx. Severity of dysphagia will not allow Mr. Esson to meet his nutritional needs via oral intake.  Recommend proceeding with PEG.  Consider allowing nectar-thick liquids to allow opportunities for swallowing and to stave off further deterioration related to disuse atrophy.  Recommend f/u SLP for dysphagia treatment in inpatient rehab.  D/W with B. Boyette from CIR and pt's mother, Nicholes Calamity, who agrees with plan.   Swallow Evaluation Recommendations       SLP Diet Recommendations: Nectar thick liquid   Liquid Administration via: Cup   Medication Administration: Via alternative means               Oral Care Recommendations: Oral care QID   Other Recommendations: Order thickener from  [employer] L. Samson Frederic, MA CCC/SLP Acute Rehabilitation Services Office number [phone] Pager [phone]  Blenda Mounts Laurice 11/02/2020,12:37 PM

## 2020-11-02 NOTE — Progress Notes (Signed)
Physical Therapy Treatment Patient Details Name: Edward Garner MRN: [id number] DOB: [birth year] Today's Date: 11/02/2020   History of Present Illness Pt is a 55 y.o. male admitted 10/19/20 with AMS, lethargy, hypoxia; workup for multifocal PNA. Head CT negative for acute abnormality. Of note, recent admission 10/3-10/4/22 for C3-6 ACDF; brief ER visit 10/4 for syncope. Other PMH includes DM, HTN, HLD, OSA, seizure, schizoaffective disorder.    PT Comments    Pt with improved activity tolerance. BP's 80's/50's supine in bed and after amb in hallway. No c/o's of lightheaded.    Recommendations for follow up therapy are one component of a multi-disciplinary discharge planning process, led by the attending physician.  Recommendations may be updated based on patient status, additional functional criteria and insurance authorization.  Follow Up Recommendations  CIR;Supervision for mobility/OOB     Equipment Recommendations  Rolling walker with 5" wheels    Recommendations for Other Services       Precautions / Restrictions Precautions Precautions: Cervical;Fall;Other (comment) Precaution Comments: monitor BP Required Braces or Orthoses: Cervical Brace Cervical Brace: Soft collar     Mobility  Bed Mobility Overal bed mobility: Needs Assistance Bed Mobility: Rolling;Sidelying to Sit Rolling: Min assist Sidelying to sit: Min assist;HOB elevated       General bed mobility comments: Assist to elevate trunk into sitting    Transfers Overall transfer level: Needs assistance Equipment used: Rolling walker (2 wheeled) Transfers: Sit to/from Stand Sit to Stand: Min assist         General transfer comment: Assist to bring hips up and for balance  Ambulation/Gait Ambulation/Gait assistance: Min assist;+2 safety/equipment Gait Distance (Feet): 80 Feet (80 x 1,  20 x 1) Assistive device: Rolling walker (2 wheeled) Gait Pattern/deviations: Step-through pattern;Decreased  stride length;Narrow base of support;Drifts right/left Gait velocity: decr Gait velocity interpretation: <1.31 ft/sec, indicative of household ambulator General Gait Details: Assist with balance and verbal cues to correct rt drift   Stairs             Wheelchair Mobility    Modified Rankin (Stroke Patients Only)       Balance Overall balance assessment: Needs assistance Sitting-balance support: No upper extremity supported;Feet supported Sitting balance-Leahy Scale: Fair     Standing balance support: Bilateral upper extremity supported Standing balance-Leahy Scale: Poor Standing balance comment: walker and min guard for static standing                            Cognition Arousal/Alertness: Awake/alert Behavior During Therapy: Impulsive Overall Cognitive Status: Impaired/Different from baseline Area of Impairment: Attention;Following commands;Safety/judgement;Problem solving                   Current Attention Level: Sustained Memory: Decreased recall of precautions;Decreased short-term memory Following Commands: Follows one step commands with increased time;Follows multi-step commands inconsistently Safety/Judgement: Decreased awareness of safety;Decreased awareness of deficits Awareness: Intellectual Problem Solving: Difficulty sequencing;Requires verbal cues;Requires tactile cues        Exercises      General Comments General comments (skin integrity, edema, etc.): BP's 80's/50 in supine and after amb. No report of lightheadedness.      Pertinent Vitals/Pain Pain Assessment: No/denies pain    Home Living                      Prior Function            PT Goals (current goals can now be  found in the care plan section) Acute Rehab PT Goals Patient Stated Goal: to be able to have a regular cup of coffee Progress towards PT goals: Progressing toward goals    Frequency    Min 3X/week      PT Plan Current plan  remains appropriate    Co-evaluation              AM-PAC PT "6 Clicks" Mobility   Outcome Measure  Help needed turning from your back to your side while in a flat bed without using bedrails?: A Little Help needed moving from lying on your back to sitting on the side of a flat bed without using bedrails?: A Little Help needed moving to and from a bed to a chair (including a wheelchair)?: A Little Help needed standing up from a chair using your arms (e.g., wheelchair or bedside chair)?: A Little Help needed to walk in hospital room?: A Little Help needed climbing 3-5 steps with a railing? : Total 6 Click Score: 16    End of Session Equipment Utilized During Treatment: Gait belt Activity Tolerance: Patient tolerated treatment well Patient left: in chair;with call bell/phone within reach;with chair alarm set;with nursing/sitter in room;with family/visitor present Nurse Communication: Mobility status (sitter assisted with chair follow) PT Visit Diagnosis: Other abnormalities of gait and mobility (R26.89);History of falling (Z91.81);Difficulty in walking, not elsewhere classified (R26.2);Muscle weakness (generalized) (M62.81)     Time: 9407-6808 PT Time Calculation (min) (ACUTE ONLY): 26 min  Charges:  $Gait Training: 8-22 mins                     [facility] PT Acute Rehabilitation Services Pager [phone] Office [phone]    Edward Garner [facility] 11/02/2020, 5:27 PM

## 2020-11-02 NOTE — Progress Notes (Signed)
PROGRESS NOTE    Edward Garner  PJA:[id number] DOB: April 16, 1965 DOA: 10/19/2020 PCP: [name], MD   Brief Narrative: 55 year old with past medical history significant for smoker, OSA, hypertension, hyperlipidemia, diabetes type 2, seizure and cervical spondylosis/stenosis status post ACDF on 10/3 presenting from home with confusion.  Patient recently underwent cervical ACDF C3 C6 with Dr. [name] on 10/3 and discharged home on 10/4.  Patient presented to the ER later that day after a syncopal episode at home with negative CT head and cervical CT, requiring scalp laceration repair.  Subsequently was discharged home.  Patient returned to the ER the day of admission due to progressive lethargy and found by EMS with oxygen saturation in the 70s, he was placed on normal breather mask and received Narcan with significant improvement.  Poor oral intake due to ongoing pain and some difficulty with swallowing. Patient was admitted with multifocal pneumonia.  CTA was negative for PE.  CT cervical which showed slightly increased prevertebral swelling compared to 10/4, P diameter of the larynx the level of the epiglottis to 5 mm.  Significant Hospital events: 10/3 cervical ACDF C3-C6 with Dr. [name], discharged home 10/4 10/4 syncope at home, ER eval neg, discharged home 10/6 Admit to PCCM for acute hypoxic respiratory failure 2/2 multifocal pna, on BiPAP 10/7 On 11L [place]  Assessment & Plan:   Active Problems:   Hypoxemia   Acute respiratory failure with hypoxia (HCC)   Malnutrition of moderate degree   Delirium due to another medical condition   Polypharmacy   Dysphagia  1-Acute Hypoxic respiratory failure in the setting of multifocal pneumonia: Concern for aspiration event with prevertebral swelling status post C-spine fusion -History of OSA on CPAP Continue with core track and n.p.o. with speech following Completed 7 days of Unasyn on 10/26/2020.  Repeat chest x-ray on 10/27/2020 shows  persistent bilateral pulmonary infiltrates.  However lungs clear to auscultation on exam and now he is off of oxygen since the night of 10/29/2020 and saturating well over 92% and is comfortable.  Patient had temperature of 100.3 yesterday.  Neurosurgery repeated chest x-ray which is read by radiology as worsening infiltrates however patient has no clinical signs of pneumonia.  Lungs clear to auscultation, no leukocytosis and procalcitonin improved and<0.1.  No indication to treat with antibiotics.  We will watch closely.  2-Dysphagia: CT with slightly increased prevertebral swelling compared to 10/4 Continue with core track and tube feeding. Speech following.  MBS completed which shows oropharyngeal phase dysphagia and moderate aspiration risk.  Remains NPO.  SLP on board.  Per neurosurgery, his surgery happened more than 2 weeks ago and this should not be the reason for his dysphagia.  Patient had another MBS done today.  His swallow has improved but he still has significant dysphagia of unknown etiology.  SLP has recommended PEG tube placement.  I have consulted IR for that.  Per SLP, patient can still discharge to CIR after PEG tube placement.  Hypernatremia:; Resolved.  Diabetes type 2: Still elevated.  Will need to increase Semglee from to 35 units twice daily.  Seizure disorder: Continue with Depakote.   Bipolar: Continue with Prozac and Wellbutrin. Holding Lyrica He reporter  to nurse last night he wants to jump out of window.. -psych recommend continue with bupropion valporic acid and Prozac. -hold adderal and Vyvasne.  -Alprazolam change to ativan.  -Psych recommend schedule pain medication, his pain is very well controlled.  With as needed medications.  Patient started on Zyprexa for  steroid-induced agitation on 10/25/2020 and psychiatry does not recommend continuing that at the time of discharge.  Per psychiatry recommendation, I I reduced his Ativan to 1/1/2 milligrams IV on  10/30/2020.  Further managed by psychiatry.  Hypomagnesemia: Resolved.  Hypertension: Labile and mostly low but he is asymptomatic.  Cozaar was discontinued on 10/27/2020 due to low blood pressure.  Metoprolol was also discontinued early morning of 10/31/2020 due to hypotension.  OSA: Patient uses CPAP at night.  Mother brought the machine to the hospital.  Per respiratory therapist note, patient did not need any BiPAP at night since he was resting comfortably.  Intermittent suicidal statements: Patient continues to make suicidal statements intermittently.  He is being seen by psychiatry very closely.  Per psychiatry, his listed meds are not serious as he denies any intent or serious plan.  Psychiatry recommended sitter just for his delirium and not for suicidal prevention.  IVC was also removed several days ago.  Recent C-spine fusion by Dr. [name] 10/4: Neurosurgery following   Nutrition Problem: Moderate Malnutrition Etiology: chronic illness    Signs/Symptoms: moderate fat depletion, moderate muscle depletion    Interventions: MVI, Tube feeding  Estimated body mass index is 21.29 kg/m as calculated from the following:   Height as of this encounter: 6' (1.829 m).   Weight as of this encounter: 71.2 kg.   DVT prophylaxis: Lovenox Code Status: Full code Family Communication: Mother at bedside Disposition Plan:  Status is: Inpatient  Remains inpatient appropriate because: Meets inpatient criteria.  Dispo: The patient is from: Home              Anticipated d/c is to: Home              Patient currently is medically stable to d/c.   Difficult to place patient No   Consultants:  Neurosurgery   Procedures:    Antimicrobials:    Subjective: Patient seen and examined with her mother at the bedside.  She was concerned about x-ray report that she read on MyChart.  She was assured as per my plan mentioned above.  Patient has no complaints at all.  Objective: Vitals:    11/01/20 2329 11/02/20 0430 11/02/20 0706 11/02/20 1210  BP: (!) 79/58 101/73 101/73 (!) 91/55  Pulse:  90 81 81  Resp:   20 15  Temp: 98.7 F (37.1 C) 98.6 F (37 C) 98.2 F (36.8 C) 98.4 F (36.9 C)  TempSrc: Oral Oral Oral Oral  SpO2:   93% 92%  Weight:  71.2 kg    Height:        Intake/Output Summary (Last 24 hours) at 11/02/2020 1347 Last data filed at 11/02/2020 1058 Gross per 24 hour  Intake --  Output 1225 ml  Net -1225 ml    Filed Weights   10/28/20 0338 10/30/20 0400 11/02/20 0430  Weight: 70.8 kg 74.5 kg 71.2 kg    Examination:  General exam: Appears calm and comfortable  Respiratory system: Clear to auscultation. Respiratory effort normal. Cardiovascular system: S1 & S2 heard, RRR. No JVD, murmurs, rubs, gallops or clicks. No pedal edema. Gastrointestinal system: Abdomen is nondistended, soft and nontender. No organomegaly or masses felt. Normal bowel sounds heard. Central nervous system: Alert and oriented. No focal neurological deficits. Extremities: Symmetric 5 x 5 power. Skin: No rashes, lesions or ulcers.  Psychiatry: Judgement and insight appear poor  Data Reviewed: I have personally reviewed following labs and imaging studies  CBC: Recent Labs  Lab 10/27/20 0402  11/01/20 1156 11/02/20 1057  WBC 15.3* 9.5 7.1  NEUTROABS 11.4* 5.7 2.9  HGB 10.7* 11.4* 10.7*  HCT 32.4* 35.2* 31.4*  MCV 99.7 96.7 95.2  PLT 338 546* 571*    Basic Metabolic Panel: Recent Labs  Lab 10/27/20 0402 10/30/20 0815 10/31/20 1112  NA 143 135 134*  K 3.9 4.1 4.1  CL 103 100 100  CO2 32 27 26  GLUCOSE 152* 103* 271*  BUN 19 16 19   CREATININE 0.81 0.87 0.93  CALCIUM 9.2 8.5* 8.8*    GFR: Estimated Creatinine Clearance: 92.5 mL/min (by C-G formula based on SCr of 0.93 mg/dL). Liver Function Tests: Recent Labs  Lab 10/31/20 1112  AST 28  ALT 42  ALKPHOS 84  BILITOT 0.3  PROT 6.1*  ALBUMIN 1.9*    No results for input(s): LIPASE, AMYLASE in the last  168 hours. Recent Labs  Lab 10/31/20 1112  AMMONIA 25    Coagulation Profile: No results for input(s): INR, PROTIME in the last 168 hours.  Cardiac Enzymes: No results for input(s): CKTOTAL, CKMB, CKMBINDEX, TROPONINI in the last 168 hours. BNP (last 3 results) No results for input(s): PROBNP in the last 8760 hours. HbA1C: No results for input(s): HGBA1C in the last 72 hours. CBG: Recent Labs  Lab 11/01/20 2044 11/01/20 2328 11/02/20 0427 11/02/20 0836 11/02/20 1209  GLUCAP 148* 193* 203* 263* 161*    Lipid Profile: No results for input(s): CHOL, HDL, LDLCALC, TRIG, CHOLHDL, LDLDIRECT in the last 72 hours. Thyroid Function Tests: No results for input(s): TSH, T4TOTAL, FREET4, T3FREE, THYROIDAB in the last 72 hours. Anemia Panel: No results for input(s): VITAMINB12, FOLATE, FERRITIN, TIBC, IRON, RETICCTPCT in the last 72 hours. Sepsis Labs: Recent Labs  Lab 10/27/20 0402 11/02/20 1057  PROCALCITON 0.26 <0.10     No results found for this or any previous visit (from the past 240 hour(s)).         Radiology Studies: DG Chest 2 View  Result Date: 11/01/2020 CLINICAL DATA:  Altered mental status and cough EXAM: CHEST - 2 VIEW COMPARISON:  Radiograph 10/27/2020 FINDINGS: Unchanged cardiomediastinal silhouette. There is a nasogastric tube which courses below the diaphragm, tip excluded by collimation. Increased bilateral airspace opacities in the mid to lower lungs. No large pleural effusion or visible pneumothorax. No acute osseous abnormality. Unchanged bilateral shoulder osteoarthritis with multiple large osteochondral joint bodies along the left shoulder. IMPRESSION: Increased bilateral airspace disease in the mid to lower lungs compatible with worsening multifocal pneumonia. Electronically Signed   By: 10/29/2020 M.D.   On: 11/01/2020 14:00   DG Cervical Spine 1 View  Result Date: 11/01/2020 CLINICAL DATA:  Altered mental status and cough, neck pain EXAM: DG  CERVICAL SPINE - 1 VIEW COMPARISON:  Cervical spine CT 10/17/2020 FINDINGS: There is a feeding catheter coursing down the esophagus. Postsurgical changes of C3-6 ACDF. There is severe disc height loss at C6-C7. There is mild to moderate multilevel facet arthropathy. Hardware is intact without evidence of loosening. There is mild residual prevertebral soft tissue swelling. IMPRESSION: C3-C6 ACDF without evidence of hardware complication. Mild residual prevertebral soft tissue swelling. Unchanged severe disc height loss at C6-C7 and mild-to-moderate multilevel facet arthropathy. Electronically Signed   By: 12/17/2020 M.D.   On: 11/01/2020 13:58   DG Swallowing Func-Speech Pathology  Result Date: 11/02/2020 Table formatting from the original result was not included. Objective Swallowing Evaluation: Type of Study: MBS-Modified Barium Swallow Study  Patient Details Name: Edward Garner MRN:  [id number] Date of Birth: [birth year] Today's Date: 11/02/2020 Time: SLP Start Time (ACUTE ONLY): 1030 -SLP Stop Time (ACUTE ONLY): 1130 SLP Time Calculation (min) (ACUTE ONLY): 60 min Past Medical History: Past Medical History: Diagnosis Date  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HCC)   Bipolar disorder (HCC)   Complication of anesthesia   Diabetes mellitus without complication (HCC)   Hyperlipidemia   Hypertension   Kidney disease   PONV (postoperative nausea and vomiting)   Sleep apnea   Sleep apnea, unspecified 10/19/2018  Spinal stenosis  Past Surgical History: Past Surgical History: Procedure Laterality Date  ANTERIOR CERVICAL DECOMP/DISCECTOMY FUSION N/A 10/16/2020  Procedure: ACDF - C3-C4 - C4-C5 - C5-C6;  Surgeon: Tia Alert, MD;  Location: [facility] OR;  Service: Neurosurgery;  Laterality: N/A;  TONSILLECTOMY   HPI: Pt is a 55 y/o male who recently underwent cervical ACDF C3-C6 on 10/3 and discharged home on 10/4.  Patient presented to ER later that day after syncopal episode at home with negative CTH and cervical CT and required scalp  laceration repair.  No other complaints, normal ddimer and hemodynamics and therefore discharged home. Pt returned to tje ER again for progressive lethargy at home and was found by EMS with O2 saturations in the 70's, placed on NRB and no improvement to narcan.  Additionally reported decrease oral intake due to ongoing pain and some difficulty swallowing, CT cervical showed slightly increased prevertebral swelling compared to 10/4 and narrowed AP diameter of the larynx the level of the epiglottis to 5 mm, CTH negative for acute intracranial process, and CTA PE was negative for PE, but showed multifocal pneumonia and thickening of esophageal wall possibly reflecting esophagitis, and hepatomegaly.  Subjective: alert Assessment / Plan / Recommendation CHL IP CLINICAL IMPRESSIONS 11/02/2020 Clinical Impression Pt presents with marginally improved swallow function, with improvements related to current mental status, however mechanics of swallowing remain impaired with no obvious etiology. (Prevertebral edema is minimal; CT head negative for acute neuro event).  Pt demonstrates poor pharyngeal squeeze, reduced mobility of hyolaryngeal structures, incomplete epiglottic closure over larynx, and reduced traction on UES for opening. Sluggish movements of structures lead to significant residue throughout pharynx (solids > liquids) and aspiration of thin liquids, penetration of nectars, and potential of spillage of residue from solids.  Pt has good sensation and a strong cough in response to aspiration. He swallows multiple times with single teaspoon boluses in an effort to transfer material from pharynx through UES, but these sub-swallows are minimally effective and material sits in pharynx.  Severity of dysphagia will not allow Mr. Carneiro to meet his nutritional needs via oral intake.  Recommend proceeding with PEG.  Consider allowing nectar-thick liquids to allow opportunities for swallowing and to stave off further  deterioration related to disuse atrophy.  Recommend f/u SLP for dysphagia treatment in inpatient rehab.  D/W with B. Boyette from CIR and pt's mother, Nicholes Calamity, who agrees with plan. SLP Visit Diagnosis Dysphagia, oropharyngeal phase (R13.12) Attention and concentration deficit following -- Frontal lobe and executive function deficit following -- Impact on safety and function Moderate aspiration risk   CHL IP TREATMENT RECOMMENDATION 11/02/2020 Treatment Recommendations Therapy as outlined in treatment plan below   Prognosis 11/02/2020 Prognosis for Safe Diet Advancement Fair Barriers to Reach Goals -- Barriers/Prognosis Comment -- CHL IP DIET RECOMMENDATION 11/02/2020 SLP Diet Recommendations Nectar thick liquid Liquid Administration via Cup Medication Administration Via alternative means Compensations -- Postural Changes --   CHL IP OTHER RECOMMENDATIONS 11/02/2020 Recommended Consults -- Oral Care  Recommendations Oral care QID Other Recommendations Order thickener from pharmacy   CHL IP FOLLOW UP RECOMMENDATIONS 11/02/2020 Follow up Recommendations Inpatient Rehab   CHL IP FREQUENCY AND DURATION 11/02/2020 Speech Therapy Frequency (ACUTE ONLY) min 2x/week Treatment Duration --      CHL IP ORAL PHASE 11/02/2020 Oral Phase WFL Oral - Pudding Teaspoon -- Oral - Pudding Cup -- Oral - Honey Teaspoon -- Oral - Honey Cup -- Oral - Nectar Teaspoon WFL Oral - Nectar Cup WFL Oral - Nectar Straw -- Oral - Thin Teaspoon WFL Oral - Thin Cup -- Oral - Thin Straw -- Oral - Puree -- Oral - Mech Soft -- Oral - Regular -- Oral - Multi-Consistency -- Oral - Pill -- Oral Phase - Comment --  CHL IP PHARYNGEAL PHASE 11/02/2020 Pharyngeal Phase Impaired Pharyngeal- Pudding Teaspoon -- Pharyngeal -- Pharyngeal- Pudding Cup -- Pharyngeal -- Pharyngeal- Honey Teaspoon Delayed swallow initiation-pyriform sinuses;Reduced pharyngeal peristalsis;Reduced epiglottic inversion;Reduced anterior laryngeal mobility;Reduced airway/laryngeal  closure;Penetration/Aspiration during swallow;Penetration/Apiration after swallow;Trace aspiration;Pharyngeal residue - valleculae;Pharyngeal residue - pyriform Pharyngeal Material enters airway, CONTACTS cords and not ejected out Pharyngeal- Honey Cup -- Pharyngeal -- Pharyngeal- Nectar Teaspoon Delayed swallow initiation-pyriform sinuses;Reduced pharyngeal peristalsis;Reduced epiglottic inversion;Reduced anterior laryngeal mobility;Reduced airway/laryngeal closure;Penetration/Aspiration during swallow;Penetration/Apiration after swallow;Trace aspiration;Pharyngeal residue - valleculae;Pharyngeal residue - pyriform Pharyngeal Material enters airway, remains ABOVE vocal cords and not ejected out Pharyngeal- Nectar Cup NT Pharyngeal -- Pharyngeal- Nectar Straw Delayed swallow initiation-pyriform sinuses;Reduced pharyngeal peristalsis;Reduced epiglottic inversion;Reduced anterior laryngeal mobility;Reduced airway/laryngeal closure;Penetration/Aspiration during swallow;Penetration/Apiration after swallow;Trace aspiration;Pharyngeal residue - valleculae;Pharyngeal residue - pyriform Pharyngeal Material enters airway, remains ABOVE vocal cords and not ejected out Pharyngeal- Thin Teaspoon NT Pharyngeal -- Pharyngeal- Thin Cup -- Pharyngeal -- Pharyngeal- Thin Straw Delayed swallow initiation-pyriform sinuses;Reduced pharyngeal peristalsis;Reduced epiglottic inversion;Reduced anterior laryngeal mobility;Reduced airway/laryngeal closure;Penetration/Aspiration during swallow;Penetration/Apiration after swallow;Trace aspiration;Pharyngeal residue - valleculae;Pharyngeal residue - pyriform Pharyngeal Material enters airway, passes BELOW cords and not ejected out despite cough attempt by patient Pharyngeal- Puree Delayed swallow initiation-vallecula;Reduced pharyngeal peristalsis;Reduced epiglottic inversion;Reduced anterior laryngeal mobility;Reduced airway/laryngeal closure;Pharyngeal residue - valleculae;Pharyngeal residue  - pyriform Pharyngeal -- Pharyngeal- Mechanical Soft -- Pharyngeal -- Pharyngeal- Regular -- Pharyngeal -- Pharyngeal- Multi-consistency -- Pharyngeal -- Pharyngeal- Pill -- Pharyngeal -- Pharyngeal Comment --  No flowsheet data found. Blenda Mounts Laurice 11/02/2020, 12:40 PM                Scheduled Meds:  buPROPion  100 mg Per Tube BID   enoxaparin (LOVENOX) injection  40 mg Subcutaneous Daily   famotidine  20 mg Per Tube BID   FLUoxetine  40 mg Per Tube Daily   free water  200 mL Per Tube Q4H   insulin aspart  0-15 Units Subcutaneous Q4H   insulin glargine-yfgn  35 Units Subcutaneous BID   LORazepam  1 mg Intravenous BID   And   LORazepam  2 mg Intravenous Q24H   multivitamin with minerals  1 tablet Per Tube Daily   OLANZapine  2.5 mg Oral Daily   And   OLANZapine  5 mg Oral QHS   polyethylene glycol  17 g Per Tube Daily   rosuvastatin  20 mg Per Tube QHS   senna-docusate  1 tablet Per Tube BID   valproic acid  500 mg Per Tube TID   Continuous Infusions:  feeding supplement (GLUCERNA 1.5 CAL) 1,000 mL (11/02/20 1333)     LOS: 14 days   Time spent: 25 minutes.   Hughie Closs, MD [facility]  If 7PM-7AM, please contact night-coverage  [email or web address]  11/02/2020, 1:47 PM

## 2020-11-02 NOTE — Plan of Care (Signed)

## 2020-11-02 NOTE — Consult Note (Signed)
[facility] Face-to-Face Psychiatry Consult   Reason for Consult: Suicidal statements Referring Physician: [name], MD.  Patient Identification: Edward Garner MRN:  [id number] Principal Diagnosis: <principal problem not specified> Diagnosis:  Active Problems:   Hypoxemia   Acute respiratory failure with hypoxia (HCC)   Malnutrition of moderate degree   Delirium due to another medical condition   Polypharmacy   Dysphagia   Total Time spent with patient: 20 minutes   Assessment  Edward Garner is a 55 y.o. male admitted medically for 10/19/2020 12:55 PM for aspiration pneumonia after a spinal procedure. He carries the psychiatric diagnoses of schizoaffective disorder vs bipolar disorder (per mom), ADHD and anxiety and has a past medical history of  adrenal disease, bipolar disorder, diabetes, hyperlipidemia, sleep apnea and spinal stenosis. Psychiatry was consulted for suicidal statments by [name], MD.     He does carry the diagnosis of bipolar disorder vs schizoaffective disorder , and has a history of pill seeking behavior. Current outpatient psychotropic medications include alprazolam, adderall, buproprion, depakote, prozac, prazosin, vyvanse, zolpidem and historically he has had a poor response to these medications; family has significant concern for overall decompensation over past 2-3 years and overmedication/pill-seeking behavior.    He was compliant with medications prior to admission. On initial examination, patient was unable to engage in a nuanced discussion of medications due to his delirium. Overall we are working towards minimizing deliriogenic meds. Please see plan below for detailed recommendations.    10/11: pt became hyperfocused on short acting xanax not covering anxiety (gets 3 mg ER in AM at home). Not amenable to discussion that he is getting IR TID, eventually agreeable to transition to ativan at roughly the BZD equivalent dose. Balancing respiratory  suppression with anxiety difficult in this pt on chronic BZD, stimulant, Z drug and opioids.    10/12: pt seems more delirious, many nonsensical responses to questions. Called steroids "suicide pills" and mom confirmed these have caused psychiatric destabilization in past. Pt assented and mom consented to olanzapine while on steroids. Specifically discussed risk of worsening tardive dyskinesia; feel olanzapine better agent than depakote in setting of significant malnutrition and hypolabuminemia. Did not discuss BZD today - no intention to further taper until later this week, discussion significantly agitated pt yesterday, pt not currently able to have full r/b/se discussion.    10/13: marginally improved. Continues to make suicidal statements which seem provocative in nature and not in line with pt's overall reported mood - continues to have residual delirium and occasionally pulls at lines, NG tube, etc - will continue sitter for now   10/14: continues with delirium, able to joke some today. A little less oriented. Denied SI today.    10/18- Patient continues to deny SI.  Patient continues to have trouble with orientation today he was oriented to self and hospital but did not know the city or state.  Patient also had trouble with the month.  Patient reports that he will try to better communicate with nursing staff when he is in pain rather than telling them he plans to jump out of the window when he is having pain.  Patient endorsed that he does plan to continue living and has not had SI in a few days.  Patient does continue to present with some symptoms concerning for delirium as he continues to have a difficult time remaining oriented.  We will decrease Wellbutrin dosage as it is a pro dopaminergic medication and patient is already taking Prozac.  The  goal is to decrease Pro dopaminergic medications in an attempt to relieve symptoms concerning for delirium.  10/20: pt with improved orientation and  attention compared to prior evals. Will decrease lorazepam to 1 TID. Will trial IV thiamine (prolonged AMS/poor nutrition). Understand he may get PEG tube in next few days.   Labs evaluated-Depakote level: 43 (subtherapeutic in general population) Patient albumin level low (2.0) we will recommend repeat CMP.  If albumin level remains low we will not adjust Depakote dosage as it would be appropriate for patient with low levels of protein for medication to bind to.  Plan  ## Safety and Observation Level:  - Based on my clinical evaluation, I estimate the patient to be at moderate  risk of self harm in the current setting from pulling at lines, tubes, etc - continue sitter (no longer recommending for SI, can be d/c if delirium improves)   ## Medications:  --Standing: CONTINUE:              - buproprion but decreased to 100 mg BID             - valproic acid 500 mg TID             - fluoxetine 40 mg qD    -Continue lorazepam to 1/1/1 (last decreased 10/20_  - next setep would be 1/1/1/ PO (relative dose reduction) - start thiamine 200 mg qD     CONTINUE olanzapine 5 QHS and 2.5 BID  Continue olanzapine 2.5 mg twice daily as needed, agitation  Continue to hold:              - zolpidem, home stimulants     We are generally making medication changes to reduce sedative burden in this patient (reducing xanax, stopping ambien).    ## Medical Decision Making Capacity:  Not specifically assessed   ## Further Work-up:  -- Recommend repeat CMP   ## Disposition:  --  no psychiatric contraindications to discharge at this time   ## Behavioral / Environmental:  -- Continue delirium precautions     Thank you for this consult request. Recommendations have been communicated to the primary team.  We will continue to follow at this time.    Subjective:   Edward Garner is a 55 y.o. male patient admitted with aspiration pneumonia after a spinal procedure. He carries the psychiatric  diagnoses of schizoaffective disorder vs bipolar disorder (per mom), ADHD and anxiety and has a past medical history of  adrenal disease, bipolar disorder, diabetes, hyperlipidemia, sleep apnea and spinal stenosis.  HPI: On assessment today patient endorses that he is not having any SI nor HI nor AH.  Per pt he has not made statements about jumping out of a window in several days. He continues to make some bizarre statements - today was quite med seeking (brought up adderall, xanax, etc 4-5 times). Discussed plan of slowly tapering deliriogenic meds slowly with pt and mother in room. Today he knew where he was, and was oriented to the month and the year. He was able to do DOWB from Sun-->Fri which is the best he has ever done.    Past Medical History:  Past Medical History:  Diagnosis Date   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HCC)    Bipolar disorder (HCC)    Complication of anesthesia    Diabetes mellitus without complication (HCC)    Hyperlipidemia    Hypertension    Kidney disease    PONV (postoperative nausea and vomiting)  Sleep apnea    Sleep apnea, unspecified 10/19/2018   Spinal stenosis     Past Surgical History:  Procedure Laterality Date   ANTERIOR CERVICAL DECOMP/DISCECTOMY FUSION N/A 10/16/2020   Procedure: ACDF - C3-C4 - C4-C5 - C5-C6;  Surgeon: Tia Alert, MD;  Location: [facility] OR;  Service: Neurosurgery;  Laterality: N/A;   TONSILLECTOMY     Family History:  Family History  Problem Relation Age of Onset   Non-Hodgkin's lymphoma Mother    Diabetes Father    Cancer Father    Thyroid disease Father    Hypertension Father    Hyperlipidemia Father    Bipolar disorder Father    Social History:  Social History   Substance and Sexual Activity  Alcohol Use Not Currently     Social History   Substance and Sexual Activity  Drug Use Never    Social History   Socioeconomic History   Marital status: Single    Spouse name: Not on file   Number of children: Not on file   Years of  education: Not on file   Highest education level: Not on file  Occupational History   Not on file  Tobacco Use   Smoking status: Former    Packs/day: 0.50    Years: 0.50    Pack years: 0.25    Types: Cigarettes    Quit date: 1996    Years since quitting: 26.8   Smokeless tobacco: Former    Types: Snuff  Vaping Use   Vaping Use: Never used  Substance and Sexual Activity   Alcohol use: Not Currently   Drug use: Never   Sexual activity: Not on file  Other Topics Concern   Not on file  Social History Narrative   Not on file   Social Determinants of Health   Financial Resource Strain: Not on file  Food Insecurity: Not on file  Transportation Needs: Not on file  Physical Activity: Not on file  Stress: Not on file  Social Connections: Not on file   Additional Social History:    Allergies:   Allergies  Allergen Reactions   Gramineae Pollens     Labs:  Results for orders placed or performed during the hospital encounter of 10/19/20 (from the past 48 hour(s))  Glucose, capillary     Status: Abnormal   Collection Time: 10/31/20  8:17 PM  Result Value Ref Range   Glucose-Capillary 149 (H) 70 - 99 mg/dL    Comment: Glucose reference range applies only to samples taken after fasting for at least 8 hours.  Glucose, capillary     Status: Abnormal   Collection Time: 11/01/20 12:15 AM  Result Value Ref Range   Glucose-Capillary 186 (H) 70 - 99 mg/dL    Comment: Glucose reference range applies only to samples taken after fasting for at least 8 hours.  Glucose, capillary     Status: Abnormal   Collection Time: 11/01/20  4:04 AM  Result Value Ref Range   Glucose-Capillary 194 (H) 70 - 99 mg/dL    Comment: Glucose reference range applies only to samples taken after fasting for at least 8 hours.  Glucose, capillary     Status: Abnormal   Collection Time: 11/01/20  8:21 AM  Result Value Ref Range   Glucose-Capillary 217 (H) 70 - 99 mg/dL    Comment: Glucose reference range  applies only to samples taken after fasting for at least 8 hours.  Glucose, capillary     Status: Abnormal  Collection Time: 11/01/20 11:40 AM  Result Value Ref Range   Glucose-Capillary 185 (H) 70 - 99 mg/dL    Comment: Glucose reference range applies only to samples taken after fasting for at least 8 hours.   Comment 1 Notify RN   CBC with Differential/Platelet     Status: Abnormal   Collection Time: 11/01/20 11:56 AM  Result Value Ref Range   WBC 9.5 4.0 - 10.5 K/uL   RBC 3.64 (L) 4.22 - 5.81 MIL/uL   Hemoglobin 11.4 (L) 13.0 - 17.0 g/dL   HCT 22.2 (L) 97.9 - 89.2 %   MCV 96.7 80.0 - 100.0 fL   MCH 31.3 26.0 - 34.0 pg   MCHC 32.4 30.0 - 36.0 g/dL   RDW 11.9 41.7 - 40.8 %   Platelets 546 (H) 150 - 400 K/uL   nRBC 0.0 0.0 - 0.2 %   Neutrophils Relative % 59 %   Neutro Abs 5.7 1.7 - 7.7 K/uL   Lymphocytes Relative 19 %   Lymphs Abs 1.8 0.7 - 4.0 K/uL   Monocytes Relative 20 %   Monocytes Absolute 1.9 (H) 0.1 - 1.0 K/uL   Eosinophils Relative 1 %   Eosinophils Absolute 0.1 0.0 - 0.5 K/uL   Basophils Relative 0 %   Basophils Absolute 0.0 0.0 - 0.1 K/uL   Immature Granulocytes 1 %   Abs Immature Granulocytes 0.06 0.00 - 0.07 K/uL    Comment: Performed at [facility], [address] [place], [place] [address]  Glucose, capillary     Status: Abnormal   Collection Time: 11/01/20  4:20 PM  Result Value Ref Range   Glucose-Capillary 142 (H) 70 - 99 mg/dL    Comment: Glucose reference range applies only to samples taken after fasting for at least 8 hours.  Urinalysis, Routine w reflex microscopic     Status: Abnormal   Collection Time: 11/01/20  6:40 PM  Result Value Ref Range   Color, Urine YELLOW YELLOW   APPearance HAZY (A) CLEAR   Specific Gravity, Urine 1.018 1.005 - 1.030   pH 7.0 5.0 - 8.0   Glucose, UA 50 (A) NEGATIVE mg/dL   Hgb urine dipstick NEGATIVE NEGATIVE   Bilirubin Urine NEGATIVE NEGATIVE   Ketones, ur NEGATIVE NEGATIVE mg/dL   Protein, ur NEGATIVE  NEGATIVE mg/dL   Nitrite NEGATIVE NEGATIVE   Leukocytes,Ua NEGATIVE NEGATIVE    Comment: Performed at [facility], [address], [place], [place] [address]  Glucose, capillary     Status: Abnormal   Collection Time: 11/01/20  8:44 PM  Result Value Ref Range   Glucose-Capillary 148 (H) 70 - 99 mg/dL    Comment: Glucose reference range applies only to samples taken after fasting for at least 8 hours.  Glucose, capillary     Status: Abnormal   Collection Time: 11/01/20 11:28 PM  Result Value Ref Range   Glucose-Capillary 193 (H) 70 - 99 mg/dL    Comment: Glucose reference range applies only to samples taken after fasting for at least 8 hours.  Glucose, capillary     Status: Abnormal   Collection Time: 11/02/20  4:27 AM  Result Value Ref Range   Glucose-Capillary 203 (H) 70 - 99 mg/dL    Comment: Glucose reference range applies only to samples taken after fasting for at least 8 hours.  Glucose, capillary     Status: Abnormal   Collection Time: 11/02/20  8:36 AM  Result Value Ref Range  Glucose-Capillary 263 (H) 70 - 99 mg/dL    Comment: Glucose reference range applies only to samples taken after fasting for at least 8 hours.  CBC with Differential/Platelet     Status: Abnormal   Collection Time: 11/02/20 10:57 AM  Result Value Ref Range   WBC 7.1 4.0 - 10.5 K/uL   RBC 3.30 (L) 4.22 - 5.81 MIL/uL   Hemoglobin 10.7 (L) 13.0 - 17.0 g/dL   HCT 16.1 (L) 09.6 - 04.5 %   MCV 95.2 80.0 - 100.0 fL   MCH 32.4 26.0 - 34.0 pg   MCHC 34.1 30.0 - 36.0 g/dL   RDW 40.9 81.1 - 91.4 %   Platelets 571 (H) 150 - 400 K/uL   nRBC 0.0 0.0 - 0.2 %   Neutrophils Relative % 40 %   Neutro Abs 2.9 1.7 - 7.7 K/uL   Lymphocytes Relative 37 %   Lymphs Abs 2.6 0.7 - 4.0 K/uL   Monocytes Relative 21 %   Monocytes Absolute 1.5 (H) 0.1 - 1.0 K/uL   Eosinophils Relative 1 %   Eosinophils Absolute 0.1 0.0 - 0.5 K/uL   Basophils Relative 1 %   Basophils Absolute 0.1 0.0 - 0.1 K/uL   Immature  Granulocytes 0 %   Abs Immature Granulocytes 0.03 0.00 - 0.07 K/uL    Comment: Performed at [facility], [address], [place], [place] [address]  Procalcitonin - Baseline     Status: None   Collection Time: 11/02/20 10:57 AM  Result Value Ref Range   Procalcitonin <0.10 ng/mL    Comment:        Interpretation: PCT (Procalcitonin) <= 0.5 ng/mL: Systemic infection (sepsis) is not likely. Local bacterial infection is possible. (NOTE)       Sepsis PCT Algorithm           Lower Respiratory Tract                                      Infection PCT Algorithm    ----------------------------     ----------------------------         PCT < 0.25 ng/mL                PCT < 0.10 ng/mL          Strongly encourage             Strongly discourage   discontinuation of antibiotics    initiation of antibiotics    ----------------------------     -----------------------------       PCT 0.25 - 0.50 ng/mL            PCT 0.10 - 0.25 ng/mL               OR       >80% decrease in PCT            Discourage initiation of                                            antibiotics      Encourage discontinuation           of antibiotics    ----------------------------     -----------------------------         PCT >= 0.50 ng/mL  PCT 0.26 - 0.50 ng/mL               AND        <80% decrease in PCT             Encourage initiation of                                             antibiotics       Encourage continuation           of antibiotics    ----------------------------     -----------------------------        PCT >= 0.50 ng/mL                  PCT > 0.50 ng/mL               AND         increase in PCT                  Strongly encourage                                      initiation of antibiotics    Strongly encourage escalation           of antibiotics                                     -----------------------------                                           PCT <= 0.25 ng/mL                                                  OR                                        > 80% decrease in PCT                                      Discontinue / Do not initiate                                             antibiotics  Performed at [facility], [address] [place], [place] [address]   Glucose, capillary     Status: Abnormal   Collection Time: 11/02/20 12:09 PM  Result Value Ref Range   Glucose-Capillary 161 (H) 70 - 99 mg/dL    Comment: Glucose reference range applies only to samples taken after fasting for at least 8 hours.  Glucose, capillary     Status: None   Collection Time: 11/02/20  3:47 PM  Result Value Ref Range   Glucose-Capillary 93 70 - 99 mg/dL    Comment: Glucose reference range applies only to samples taken after fasting for at least 8 hours.   Comment 1 Notify RN     Current Facility-Administered Medications  Medication Dose Route Frequency Provider Last Rate Last Admin   buPROPion (WELLBUTRIN) tablet 100 mg  100 mg Per Tube BID Eliseo Gum B, MD   100 mg at 11/02/20 0915   docusate (COLACE) 50 MG/5ML liquid 100 mg  100 mg Per Tube BID PRN [name], MD       enoxaparin (LOVENOX) injection 40 mg  40 mg Subcutaneous Daily Hughie Closs, MD   40 mg at 11/02/20 0913   famotidine (PEPCID) tablet 20 mg  20 mg Per Tube BID [name], MD   20 mg at 11/02/20 0914   feeding supplement (GLUCERNA 1.5 CAL) liquid 1,000 mL  1,000 mL Per Tube Continuous Hughie Closs, MD 65 mL/hr at 11/02/20 1333 1,000 mL at 11/02/20 1333   FLUoxetine (PROZAC) 20 MG/5ML solution 40 mg  40 mg Per Tube Daily [name], MD   40 mg at 11/02/20 0913   free water 200 mL  200 mL Per Tube Q4H Regalado, Belkys A, MD   200 mL at 11/02/20 1608   hydrALAZINE (APRESOLINE) injection 10 mg  10 mg Intravenous Q4H PRN Tobey Grim, NP       HYDROmorphone (DILAUDID) injection 0.5 mg  0.5 mg Intravenous Q4H PRN Regalado, Belkys A, MD   0.5 mg at 10/31/20 1240    insulin aspart (novoLOG) injection 0-15 Units  0-15 Units Subcutaneous Q4H Tobey Grim, NP   3 Units at 11/02/20 1356   insulin glargine-yfgn (SEMGLEE) injection 35 Units  35 Units Subcutaneous BID Hughie Closs, MD   35 Units at 11/02/20 1005   ipratropium-albuterol (DUONEB) 0.5-2.5 (3) MG/3ML nebulizer solution 3 mL  3 mL Nebulization Q6H PRN Mikey College T, MD       labetalol (NORMODYNE) injection 10 mg  10 mg Intravenous Q2H PRN Tobey Grim, NP       lip balm (CARMEX) ointment   Topical PRN Rica Mote, MD       LORazepam (ATIVAN) injection 1 mg  1 mg Intravenous TID Dewie Ahart A       multivitamin with minerals tablet 1 tablet  1 tablet Per Tube Daily [name], MD   1 tablet at 11/02/20 0914   OLANZapine (ZYPREXA) tablet 2.5 mg  2.5 mg Per Tube BID PRN Makenna Macaluso A   2.5 mg at 11/01/20 2350   OLANZapine (ZYPREXA) tablet 2.5 mg  2.5 mg Oral Daily Maryagnes Amos, FNP   2.5 mg at 11/02/20 6073   And   OLANZapine (ZYPREXA) tablet 5 mg  5 mg Oral QHS Maryagnes Amos, FNP   5 mg at 11/01/20 2350   ondansetron (ZOFRAN) injection 4 mg  4 mg Intravenous Q6H PRN Mikey College T, MD       oxyCODONE-acetaminophen (PERCOCET/ROXICET) 5-325 MG per tablet 1-2 tablet  1-2 tablet Per Tube Q6H PRN Regalado, Belkys A, MD   1 tablet at 11/02/20 0919   polyethylene glycol (MIRALAX / GLYCOLAX) packet 17 g  17 g Per Tube Daily PRN [name], MD   17 g at 10/25/20 0950   polyethylene glycol (MIRALAX / GLYCOLAX) packet 17 g  17 g Per Tube Daily Regalado, Belkys A, MD   17  g at 11/02/20 0913   rosuvastatin (CRESTOR) tablet 20 mg  20 mg Per Tube QHS [name], MD   20 mg at 11/01/20 2350   senna-docusate (Senokot-S) tablet 1 tablet  1 tablet Per Tube BID Regalado, Belkys A, MD   1 tablet at 11/02/20 0916   thiamine (B-1) 200 mg in sodium chloride 0.9 % 50 mL IVPB  200 mg Intravenous Daily Laine Fonner A 100 mL/hr at 11/02/20 1730 200 mg  at 11/02/20 1730   valproic acid (DEPAKENE) 250 MG/5ML solution 500 mg  500 mg Per Tube TID [name], MD   500 mg at 11/02/20 1627               Psychiatric Specialty Exam:  Presentation  General Appearance: Appropriate for Environment  Eye Contact:Fair  Speech:Slurred  Speech Volume:Normal  Handedness:Right   Mood and Affect  Mood:Euthymic  Affect:Constricted   Thought Process  Thought Processes:Goal Directed  Descriptions of Associations:Circumstantial  Orientation:-- (not oriented to state or city but was aware of the hospital he was in. Was not oriented to month, but was oriented to year and name)  Thought Content:Logical  History of Schizophrenia/Schizoaffective disorder:No data recorded Duration of Psychotic Symptoms:No data recorded Hallucinations:No data recorded  Ideas of Reference:Delusions  Suicidal Thoughts:No data recorded  Homicidal Thoughts:No data recorded   Sensorium  Memory:Immediate Fair; Recent Fair; Remote Poor  Judgment:-- (Improving)  Insight:Shallow   Executive Functions  Concentration:Fair  Attention Span:Fair  Recall:Poor  Fund of Knowledge:Fair  Language:Fair   Psychomotor Activity  Psychomotor Activity:No data recorded   Assets  Assets:Resilience; Social Support   Sleep  Sleep:No data recorded   Physical Exam: Physical Exam Eyes:     Extraocular Movements: Extraocular movements intact.  Pulmonary:     Effort: Pulmonary effort is normal.  Skin:    General: Skin is warm.  Neurological:     Mental Status: He is alert.     Comments: Partially oriented   Review of Systems  Neurological:  Positive for headaches.       Says pain is well controlled with current medication regimen   Psychiatric/Behavioral:  Negative for suicidal ideas.   Blood pressure (!) 82/62, pulse 87, temperature (!) 97.5 F (36.4 C), temperature source Oral, resp. rate 15, height 6' (1.829 m), weight 71.2 kg,  SpO2 95 %. Body mass index is 21.29 kg/m.  PGY-2 [place] Randi Poullard 11/02/2020 6:02 PM

## 2020-11-02 NOTE — Progress Notes (Signed)
Physical Therapy Treatment Patient Details Name: Edward Garner MRN: [id number] DOB: [birth year] Today's Date: 11/02/2020   History of Present Illness Pt is a 55 y.o. male admitted 10/19/20 with AMS, lethargy, hypoxia; workup for multifocal PNA. Head CT negative for acute abnormality. Of note, recent admission 10/3-10/4/22 for C3-6 ACDF; brief ER visit 10/4 for syncope. Other PMH includes DM, HTN, HLD, OSA, seizure, schizoaffective disorder.    PT Comments    Planned to work on ambulation but pt very light headed in standing and unable to ambulate. After standing for 20-30 seconds pt reported he was lightheaded and had to sit down. BP 85/54 after he sat down. Waited 3 minutes and rechecked 91/57. Waited 2 additional minutes 79/44. Assisted pt back to be taken for swallow study. Will continue to progress mobility as able.    Recommendations for follow up therapy are one component of a multi-disciplinary discharge planning process, led by the attending physician.  Recommendations may be updated based on patient status, additional functional criteria and insurance authorization.  Follow Up Recommendations  CIR;Supervision for mobility/OOB     Equipment Recommendations  Rolling walker with 5" wheels    Recommendations for Other Services       Precautions / Restrictions Precautions Precautions: Cervical;Fall;Other (comment) Precaution Booklet Issued: No Precaution Comments: monitor BP Required Braces or Orthoses: Cervical Brace Cervical Brace: Soft collar Restrictions Weight Bearing Restrictions: No     Mobility  Bed Mobility Overal bed mobility: Needs Assistance Bed Mobility: Sit to Sidelying        Sit to sidelying: Min assist General bed mobility comments: Assist to lower trunk and bring legs back up into bed    Transfers Overall transfer level: Needs assistance Equipment used: Rolling walker (2 wheeled) Transfers: Sit to/from [employer] Sit to  Stand: Min assist Stand pivot transfers: Min assist       General transfer comment: Assist for balance and verbal cues for hand placement. Chair to bed with stand pivot with walker with short pivotal steps and pt having to sit quickly due to light headed  Ambulation/Gait             General Gait Details: Unable due to pt lightheaded with low BP   Stairs             Wheelchair Mobility    Modified Rankin (Stroke Patients Only)       Balance Overall balance assessment: Needs assistance Sitting-balance support: No upper extremity supported;Feet supported Sitting balance-Leahy Scale: Fair     Standing balance support: Bilateral upper extremity supported Standing balance-Leahy Scale: Poor Standing balance comment: walker and min guard for static standing                            Cognition Arousal/Alertness: Awake/alert Behavior During Therapy: Impulsive Overall Cognitive Status: Impaired/Different from baseline Area of Impairment: Attention;Following commands;Safety/judgement;Problem solving                   Current Attention Level: Sustained Memory: Decreased recall of precautions;Decreased short-term memory Following Commands: Follows one step commands with increased time;Follows multi-step commands inconsistently Safety/Judgement: Decreased awareness of safety;Decreased awareness of deficits Awareness: Intellectual Problem Solving: Difficulty sequencing;Requires verbal cues;Requires tactile cues       Exercises     General Comments General comments (skin integrity, edema, etc.): BP in sitting after stand 85/54: 3 minutes later 91/57: 2 minutes later 79/44      Pertinent Vitals/Pain  Pain Assessment: No/denies pain     Home Living                      Prior Function            PT Goals (current goals can now be found in the care plan section) Acute Rehab PT Goals Patient Stated Goal: to be able to have a regular  cup of coffee Progress towards PT goals: Not progressing toward goals - comment (low BP)    Frequency    Min 3X/week      PT Plan Current plan remains appropriate    Co-evaluation              AM-PAC PT "6 Clicks" Mobility   Outcome Measure  Help needed turning from your back to your side while in a flat bed without using bedrails?: A Little Help needed moving from lying on your back to sitting on the side of a flat bed without using bedrails?: A Little Help needed moving to and from a bed to a chair (including a wheelchair)?: A Little Help needed standing up from a chair using your arms (e.g., wheelchair or bedside chair)?: A Little Help needed to walk in hospital room?: Total Help needed climbing 3-5 steps with a railing? : Total 6 Click Score: 14    End of Session Equipment Utilized During Treatment: Gait belt Activity Tolerance: Treatment limited secondary to medical complications (Comment) (low BP) Patient left: in bed;Other (comment) (Pt being taken for MBS) Nurse Communication: Other (comment) (low BP) PT Visit Diagnosis: Other abnormalities of gait and mobility (R26.89);History of falling (Z91.81);Difficulty in walking, not elsewhere classified (R26.2);Muscle weakness (generalized) (M62.81)     Time: 1000-1014 PT Time Calculation (min) (ACUTE ONLY): 14 min  Charges:  $Therapeutic Activity: 8-22 mins                     [facility] PT Acute Rehabilitation Services Pager [phone] Office [phone]    Angelina Ok [facility] 11/02/2020, 12:45 PM

## 2020-11-02 NOTE — Progress Notes (Signed)
Occupational Therapy Treatment Patient Details Name: Edward Garner MRN: [id number] DOB: [birth year] Today's Date: 11/02/2020   History of present illness Pt is a 54 y.o. male admitted 10/19/20 with AMS, lethargy, hypoxia; workup for multifocal PNA. Head CT negative for acute abnormality. Of note, recent admission 10/3-10/4/22 for C3-6 ACDF; brief ER visit 10/4 for syncope. Other PMH includes DM, HTN, HLD, OSA, seizure, schizoaffective disorder.   OT comments  Pt making incremental progress with OT goals this session. He presented with increased activity tolerance, as he was able to stand at the sink for the entirety of the grooming tasks (15+ mins). Pt requiring constant supervision and support as he is very unsteady and impulsive. Continue to recommend CIR as it will maximize pt safety, ADL performance, and functional mobility. OT will continue to follow acutely.    Recommendations for follow up therapy are one component of a multi-disciplinary discharge planning process, led by the attending physician.  Recommendations may be updated based on patient status, additional functional criteria and insurance authorization.    Follow Up Recommendations  CIR;Supervision/Assistance - 24 hour    Equipment Recommendations  Other (comment) (TBD)    Recommendations for Other Services      Precautions / Restrictions Precautions Precautions: Cervical;Fall;Other (comment) Precaution Booklet Issued: No Precaution Comments: cortrak, monitor BP Required Braces or Orthoses: Cervical Brace Cervical Brace: Soft collar Restrictions Weight Bearing Restrictions: No       Mobility Bed Mobility Overal bed mobility: Needs Assistance Bed Mobility: Supine to Sit     Supine to sit: Min guard     General bed mobility comments: Pt using legs to propel himself up, min guard for safety    Transfers Overall transfer level: Needs assistance Equipment used: Rolling walker (2 wheeled) Transfers: Sit  to/from Stand Sit to Stand: Min guard         General transfer comment: Min guard for safety, cuing to stand tall    Balance Overall balance assessment: Needs assistance Sitting-balance support: No upper extremity supported;Feet supported Sitting balance-Leahy Scale: Fair     Standing balance support: Bilateral upper extremity supported Standing balance-Leahy Scale: Poor Standing balance comment: benefits from UE support of RW                           ADL either performed or assessed with clinical judgement   ADL Overall ADL's : Needs assistance/impaired Eating/Feeding: NPO   Grooming: Wash/dry hands;Wash/dry face;Oral care;Cueing for sequencing;Cueing for safety;Standing;Min guard Grooming Details (indicate cue type and reason): Min guard for safety with min-mod verbal cuing for sequencing with all tasks                 Toilet Transfer: Minimal Production designer, theatre/television/film Details (indicate cue type and reason): Min A due to balance and cuing to use RW.         Functional mobility during ADLs: Minimal assistance;Rolling walker General ADL Comments: Pt limited by cognitiive deficits and balance deficits, requiring min A for safety and steadying with all tasks.     Vision       Perception     Praxis      Cognition Arousal/Alertness: Awake/alert Behavior During Therapy: Impulsive Overall Cognitive Status: Impaired/Different from baseline Area of Impairment: Attention;Following commands;Safety/judgement;Problem solving                   Current Attention Level: Sustained   Following Commands: Follows one step commands with increased time;Follows multi-step  commands inconsistently Safety/Judgement: Decreased awareness of safety;Decreased awareness of deficits   Problem Solving: Difficulty sequencing;Requires verbal cues;Requires tactile cues General Comments: tangential thought        Exercises Exercises: Other exercises Other  Exercises Other Exercises: Seated Marching x10 BLE Other Exercises: Seated kick outs, x10, BLE Other Exercises: Chair Push ups x10   Shoulder Instructions       General Comments VSS BP 106/67 then, 102/64.    Pertinent Vitals/ Pain       Pain Assessment: 0-10 Pain Score: 4  Pain Location: neck Pain Descriptors / Indicators: Grimacing Pain Intervention(s): Limited activity within patient's tolerance;Monitored during session;Repositioned  Home Living                                          Prior Functioning/Environment              Frequency  Min 2X/week        Progress Toward Goals  OT Goals(current goals can now be found in the care plan section)  Progress towards OT goals: Progressing toward goals  Acute Rehab OT Goals Patient Stated Goal: To move around more today OT Goal Formulation: With patient Time For Goal Achievement: 11/07/20 Potential to Achieve Goals: Fair ADL Goals Pt Will Perform Grooming: with set-up;sitting;standing;with supervision Pt Will Perform Lower Body Dressing: with supervision;sit to/from stand Pt Will Transfer to Toilet: with supervision;ambulating Pt Will Perform Toileting - Clothing Manipulation and hygiene: with supervision;sit to/from stand Additional ADL Goal #1: Pt will consistently follow 1 step commands with 90% accuracy given increased time.  Plan Discharge plan remains appropriate;Frequency remains appropriate    Co-evaluation                 AM-PAC OT "6 Clicks" Daily Activity     Outcome Measure   Help from another person eating meals?: Total Help from another person taking care of personal grooming?: A Little Help from another person toileting, which includes using toliet, bedpan, or urinal?: A Lot Help from another person bathing (including washing, rinsing, drying)?: A Little Help from another person to put on and taking off regular upper body clothing?: A Little Help from another person  to put on and taking off regular lower body clothing?: A Little 6 Click Score: 15    End of Session Equipment Utilized During Treatment: Gait belt;Rolling walker;Cervical collar  OT Visit Diagnosis: Unsteadiness on feet (R26.81);Other abnormalities of gait and mobility (R26.89);Muscle weakness (generalized) (M62.81);Other symptoms and signs involving cognitive function   Activity Tolerance Patient tolerated treatment well   Patient Left in chair;with call bell/phone within reach;with nursing/sitter in room   Nurse Communication Mobility status        Time: 0921-1001 OT Time Calculation (min): 40 min  Charges: OT General Charges $OT Visit: 1 Visit OT Treatments $Self Care/Home Management : 8-22 mins $Therapeutic Activity: 23-37 mins  Amilcar Reever H., OTR/L Acute Rehabilitation  Pastor Sgro Elane Yohana Bartha 11/02/2020, 11:16 AM

## 2020-11-02 NOTE — Progress Notes (Signed)
Nutrition Follow-up  DOCUMENTATION CODES:   Non-severe (moderate) malnutrition in context of chronic illness  INTERVENTION:  Continue TF via Cortrak tube: Glucerna 1.5 @ 71m/hr (15657md) Free water flushes 200 ml every 4 hours   Provides 2340 kcal, 128 grams protein, 1184 ml free water (238436motal free water with flushes)  If diet unable to be safely advanced, recommend placement of PEG for ongoing nutrition support.    NUTRITION DIAGNOSIS:   Moderate Malnutrition related to chronic illness as evidenced by moderate fat depletion, moderate muscle depletion.  ongoing  GOAL:   Patient will meet greater than or equal to 90% of their needs  Met with TF  MONITOR:   TF tolerance, Weight trends, Skin  REASON FOR ASSESSMENT:   Consult Enteral/tube feeding initiation and management  ASSESSMENT:   [age]o. male presented to the ED with lethargy and O2 saturations in the 70's. Pt received a cervical ACDF C3-C6 on 10/3 and d/c on 10/4, pt returned to the ED after passing out and was d/c that day. Pt then had a televisit with PCP for pneumonia and was placed on an antibiotic. PMH of T2DM and HTN. Pt admitted with hypoxia respiratory failure due to pneumonia.  10/03 - cervical ACDF C3-C6  10/04 - discharged home; syncope at home w/ ER visit and later d/c  10/06 - admit to PCCM for acute hypoxic respiratory failure 2/2 multifocal PNA, on BiPAP 10/07 - Cortrak placed (tip gastric per xray), pt on 11L [place] 10/14 - repeat CXR shows persistent bilateral pulmonary infiltrates  RD received secure chat regarding available tube feeding formulas. Currently out of Jevity 1.5 entirely and out of liter containers of Osmolite 1.5. Will adjust orders given this information.   Pt remains NPO, receiving TF via Cortrak. Current TF orders: Jevity 1.5 at 65 ml/h (1560 ml per day) w/ 55m56mosource TF BID and 200ml58me water 200ml 42m(provides 2420 kcal, 122 gm protein, 2386 ml free water total  daily).  SLP following. Plan for repeat MBSS today. If pt unable to have diet safely advanced, recommend placement of PEG for ongoing nutrition support.   UOP: 825ml x14mours I/O: -2469ml [id number]  Edema: non-pitting edema to BLE per RN assessment  Admission weight 73.3 kg Current weight 71.2 kg  Medications: Pepcid, Novolog SSI Q4H, 35 units BID Semglee, MVI with minerals, Miralax, Senokot-S Labs: Na 134 (L) CBGs: 142-263 x24 hours  Diet Order:   Diet Order             Diet NPO time specified Except for: Ice Chips  Diet effective now                   EDUCATION NEEDS:   No education needs have been identified at this time  Skin:  Skin Assessment: Skin Integrity Issues: Skin Integrity Issues:: Incisions Incisions: Neck  Last BM:  10/19  Height:   Ht Readings from Last 1 Encounters:  10/19/20 6' (1.829 m)    Weight:   Wt Readings from Last 1 Encounters:  11/02/20 71.2 kg    Ideal Body Weight:  80.9 kg  BMI:  Body mass index is 21.29 kg/m.  Estimated Nutritional Needs:   Kcal:  [id number]:  115-130 grams  Fluid:  >/= 2.3 L    Carden Teel ALarkin Ina, LDN (she/her/hers) RD pager number and weekend/on-call pager number located in [email or web address]

## 2020-11-03 ENCOUNTER — Inpatient Hospital Stay (HOSPITAL_COMMUNITY): Payer: Medicare Other

## 2020-11-03 DIAGNOSIS — Z981 Arthrodesis status: Secondary | ICD-10-CM

## 2020-11-03 DIAGNOSIS — R0902 Hypoxemia: Secondary | ICD-10-CM | POA: Diagnosis not present

## 2020-11-03 DIAGNOSIS — J690 Pneumonitis due to inhalation of food and vomit: Secondary | ICD-10-CM | POA: Diagnosis not present

## 2020-11-03 DIAGNOSIS — Z4659 Encounter for fitting and adjustment of other gastrointestinal appliance and device: Secondary | ICD-10-CM

## 2020-11-03 DIAGNOSIS — R1310 Dysphagia, unspecified: Secondary | ICD-10-CM | POA: Diagnosis not present

## 2020-11-03 LAB — GLUCOSE, CAPILLARY
Glucose-Capillary: 111 mg/dL — ABNORMAL HIGH (ref 70–99)
Glucose-Capillary: 112 mg/dL — ABNORMAL HIGH (ref 70–99)
Glucose-Capillary: 116 mg/dL — ABNORMAL HIGH (ref 70–99)
Glucose-Capillary: 124 mg/dL — ABNORMAL HIGH (ref 70–99)
Glucose-Capillary: 132 mg/dL — ABNORMAL HIGH (ref 70–99)
Glucose-Capillary: 155 mg/dL — ABNORMAL HIGH (ref 70–99)
Glucose-Capillary: 79 mg/dL (ref 70–99)

## 2020-11-03 NOTE — Progress Notes (Signed)
SLP Cancellation Note  Patient Details Name: Edward Garner MRN: [id number] DOB: 1965-05-21   Cancelled treatment:       Reason Eval/Treat Not Completed: Other (comment)- Pt NPO for PEG procedure today.  Provided encouragement to pt, who expressed some anxiety about process.  Edward Dejaynes L. Samson Frederic, MA CCC/SLP Acute Rehabilitation Services Office number [phone] Pager [phone]    Edward Garner 11/03/2020, 11:07 AM

## 2020-11-03 NOTE — Progress Notes (Signed)
PROGRESS NOTE    Edward Garner  QMV:[id number] DOB: 1965/09/04 DOA: 10/19/2020 PCP: [name], MD   Brief Narrative: 55 year old with past medical history significant for smoker, OSA, hypertension, hyperlipidemia, diabetes type 2, seizure and cervical spondylosis/stenosis status post ACDF on 10/3 presenting from home with confusion.  Patient recently underwent cervical ACDF C3 C6 with Dr. [name] on 10/3 and discharged home on 10/4.  Patient presented to the ER later that day after a syncopal episode at home with negative CT head and cervical CT, requiring scalp laceration repair.  Subsequently was discharged home.  Patient returned to the ER the day of admission due to progressive lethargy and found by EMS with oxygen saturation in the 70s, he was placed on normal breather mask and received Narcan with significant improvement.  Poor oral intake due to ongoing pain and some difficulty with swallowing. Patient was admitted with multifocal pneumonia.  CTA was negative for PE.  CT cervical which showed slightly increased prevertebral swelling compared to 10/4, P diameter of the larynx the level of the epiglottis to 5 mm.  Significant Hospital events: 10/3 cervical ACDF C3-C6 with Dr. [name], discharged home 10/4 10/4 syncope at home, ER eval neg, discharged home 10/6 Admit to PCCM for acute hypoxic respiratory failure 2/2 multifocal pna, on BiPAP 10/7 On 11L [place]  11/03/2020: Patient seen alongside patient's mother.  Patient is awaiting PEG tube placement.  The plan is for patient to be discharged to CIR.  No complaints today.  Assessment & Plan:   Active Problems:   Hypoxemia   Acute respiratory failure with hypoxia (HCC)   Malnutrition of moderate degree   Delirium due to another medical condition   Polypharmacy   Dysphagia  1-Acute Hypoxic respiratory failure in the setting of multifocal pneumonia: Concern for aspiration event with prevertebral swelling status post C-spine  fusion -History of OSA on CPAP Continue with core track and n.p.o. with speech following Completed 7 days of Unasyn on 10/26/2020.  Repeat chest x-ray on 10/27/2020 shows persistent bilateral pulmonary infiltrates.  However lungs clear to auscultation on exam and now he is off of oxygen since the night of 10/29/2020 and saturating well over 92% and is comfortable.  Patient had temperature of 100.3 yesterday.  Neurosurgery repeated chest x-ray which is read by radiology as worsening infiltrates however patient has no clinical signs of pneumonia.  Lungs clear to auscultation, no leukocytosis and procalcitonin improved and<0.1.  No indication to treat with antibiotics.  We will watch closely. 11/03/2020: Patient remained stable.  2-Dysphagia: CT with slightly increased prevertebral swelling compared to 10/4 Continue with core track and tube feeding. Speech following.  MBS completed which shows oropharyngeal phase dysphagia and moderate aspiration risk.  Remains NPO.  SLP on board.  Per neurosurgery, his surgery happened more than 2 weeks ago and this should not be the reason for his dysphagia.  Patient had another MBS done today.  His swallow has improved but he still has significant dysphagia of unknown etiology.  SLP has recommended PEG tube placement.  I have consulted IR for that.  Per SLP, patient can still discharge to CIR after PEG tube placement. 11/03/2020: Possible PEG tube placement today.  Plan is to discharge patient to CIR.  Hypernatremia:; Resolved.  Diabetes type 2: Still elevated.  Will need to increase Semglee from to 35 units twice daily.  Seizure disorder: Continue with Depakote.   Bipolar: Continue with Prozac and Wellbutrin. Holding Lyrica He reporter  to nurse last night he  wants to jump out of window.. -psych recommend continue with bupropion valporic acid and Prozac. -hold adderal and Vyvasne.  -Alprazolam change to ativan.  -Psych recommend schedule pain medication,  his pain is very well controlled.  With as needed medications.  Patient started on Zyprexa for steroid-induced agitation on 10/25/2020 and psychiatry does not recommend continuing that at the time of discharge.  Per psychiatry recommendation, I I reduced his Ativan to 1/1/2 milligrams IV on 10/30/2020.  Further managed by psychiatry.  Hypomagnesemia: Resolved.  Hypertension: Labile and mostly low but he is asymptomatic.  Cozaar was discontinued on 10/27/2020 due to low blood pressure.  Metoprolol was also discontinued early morning of 10/31/2020 due to hypotension.  OSA: Patient uses CPAP at night.  Mother brought the machine to the hospital.  Per respiratory therapist note, patient did not need any BiPAP at night since he was resting comfortably.  Intermittent suicidal statements: As per prior documentation: "Patient continues to make suicidal statements intermittently.  He is being seen by psychiatry very closely.  Per psychiatry, his listed meds are not serious as he denies any intent or serious plan.  Psychiatry recommended sitter just for his delirium and not for suicidal prevention.  IVC was also removed several days ago".  Recent C-spine fusion by Dr. [name] 10/4: Neurosurgery following   Nutrition Problem: Moderate Malnutrition Etiology: chronic illness    Signs/Symptoms: moderate fat depletion, moderate muscle depletion    Interventions: MVI, Tube feeding  Estimated body mass index is 21.29 kg/m as calculated from the following:   Height as of this encounter: 6' (1.829 m).   Weight as of this encounter: 71.2 kg.   DVT prophylaxis: Lovenox Code Status: Full code Family Communication: Mother at bedside Disposition Plan:  Status is: Inpatient  Remains inpatient appropriate because: Meets inpatient criteria.  Dispo: The patient is from: Home              Anticipated d/c is to: Home              Patient currently is medically stable to d/c.   Difficult to place patient  No   Consultants:  Neurosurgery   Procedures:    Antimicrobials:    Subjective: Patient seen alongside patient's mother. No new complaints. No fever or chills. No chest pain.  Objective: Vitals:   11/02/20 2000 11/03/20 0013 11/03/20 0326 11/03/20 0731  BP: (!) 91/47 (!) 88/59 (!) 115/56 (!) 101/50  Pulse: 83 88 88 85  Resp: Temp: 98.1 F (36.7 C) 97.8 F (36.6 C) 98.1 F (36.7 C) 98 F (36.7 C)  TempSrc: Oral Oral Oral Oral  SpO2: 94% 95% 99% 95%  Weight:      Height:        Intake/Output Summary (Last 24 hours) at 11/03/2020 1200 Last data filed at 11/02/2020 2357 Gross per 24 hour  Intake 1105.34 ml  Output 800 ml  Net 305.34 ml    Filed Weights   10/28/20 0338 10/30/20 0400 11/02/20 0430  Weight: 70.8 kg 74.5 kg 71.2 kg    Examination:  General exam: Appears calm and comfortable  Respiratory system: Clear to auscultation. Respiratory effort normal. Cardiovascular system: S1 & S2 heard, RRR. No JVD, murmurs, rubs, gallops or clicks. No pedal edema. Gastrointestinal system: Abdomen is nondistended, soft and nontender. No organomegaly or masses felt. Normal bowel sounds heard. Central nervous system: Alert and oriented. No focal neurological deficits. Extremities: Symmetric 5 x 5 power. Skin: No rashes, lesions  or ulcers.  Psychiatry: Judgement and insight appear poor  Data Reviewed: I have personally reviewed following labs and imaging studies  CBC: Recent Labs  Lab 11/01/20 1156 11/02/20 1057  WBC 9.5 7.1  NEUTROABS 5.7 2.9  HGB 11.4* 10.7*  HCT 35.2* 31.4*  MCV 96.7 95.2  PLT 546* 571*    Basic Metabolic Panel: Recent Labs  Lab 10/30/20 0815 10/31/20 1112  NA 135 134*  K 4.1 4.1  CL 100 100  CO2 27 26  GLUCOSE 103* 271*  BUN 16 19  CREATININE 0.87 0.93  CALCIUM 8.5* 8.8*    GFR: Estimated Creatinine Clearance: 92.5 mL/min (by C-G formula based on SCr of 0.93 mg/dL). Liver Function Tests: Recent Labs  Lab  10/31/20 1112  AST 28  ALT 42  ALKPHOS 84  BILITOT 0.3  PROT 6.1*  ALBUMIN 1.9*    No results for input(s): LIPASE, AMYLASE in the last 168 hours. Recent Labs  Lab 10/31/20 1112  AMMONIA 25    Coagulation Profile: No results for input(s): INR, PROTIME in the last 168 hours.  Cardiac Enzymes: No results for input(s): CKTOTAL, CKMB, CKMBINDEX, TROPONINI in the last 168 hours. BNP (last 3 results) No results for input(s): PROBNP in the last 8760 hours. HbA1C: No results for input(s): HGBA1C in the last 72 hours. CBG: Recent Labs  Lab 11/02/20 1920 11/03/20 0016 11/03/20 0356 11/03/20 0734 11/03/20 1132  GLUCAP 119* 155* 132* 124* 79    Lipid Profile: No results for input(s): CHOL, HDL, LDLCALC, TRIG, CHOLHDL, LDLDIRECT in the last 72 hours. Thyroid Function Tests: No results for input(s): TSH, T4TOTAL, FREET4, T3FREE, THYROIDAB in the last 72 hours. Anemia Panel: No results for input(s): VITAMINB12, FOLATE, FERRITIN, TIBC, IRON, RETICCTPCT in the last 72 hours. Sepsis Labs: Recent Labs  Lab 11/02/20 1057  PROCALCITON <0.10     No results found for this or any previous visit (from the past 240 hour(s)).         Radiology Studies: CT ABDOMEN WO CONTRAST  Result Date: 11/03/2020 CLINICAL DATA:  Dysphagia.  Possible G-tube placement. EXAM: CT ABDOMEN WITHOUT CONTRAST TECHNIQUE: Multidetector CT imaging of the abdomen was performed following the standard protocol without IV contrast. COMPARISON:  FL swallow study, 11/02/2020. Chest radiograph, 11/01/2020. CTA PE 10/19/2020. CT abdomen pelvis, 10/01/2019. FINDINGS: Lower chest: Dependent bibasilar consolidative opacities, additional radiodensities at the LEFT lung base-likely aspirated. (See key image) Hepatobiliary: Incidental prominent LEFT hepatic lobe. No focal liver abnormality is seen. No gallstones, gallbladder wall thickening, or biliary dilatation. Pancreas: Normal noncontrast appearance. No pancreatic  ductal dilatation or surrounding inflammatory changes. Spleen: Normal in size without focal abnormality. Adrenals/Urinary Tract: Adrenal glands are unremarkable. Absent LEFT kidney. RIGHT renal calculi, largest measuring 0.4 cm. No hydronephrosis. Stomach/Bowel: Enteric feeding tube, with tip at the pylorus. Nonobstructed small bowel. Imaged portions of colon are nondilated. Retained contrast within colon, likely from recent administration. Vascular/Lymphatic: Aortic atherosclerosis. No enlarged abdominal lymph nodes. Other: No abdominal wall hernia or abnormality. Musculoskeletal: No acute osseous abnormality. IMPRESSION: 1. Bibasilar aspiration pneumonia, including retained contrast within the dependent LEFT lung. 2. Prominent LEFT hepatic lobe and high-riding small-bowel loops at the LEFT upper quadrant. Findings are equivocal for feasibility of percutaneous gastrostomy placement, though an attempt with adequate gastric insufflation could be warranted. Roanna Banning, MD [facility] Radiology Electronically Signed   By: Roanna Banning M.D.   On: 11/03/2020 11:33   DG Swallowing Func-Speech Pathology  Result Date: 11/02/2020 Table formatting from  the original result was not included. Objective Swallowing Evaluation: Type of Study: MBS-Modified Barium Swallow Study  Patient Details Name: Edward Garner MRN: [id number] Date of Birth: [birth year] Today's Date: 11/02/2020 Time: SLP Start Time (ACUTE ONLY): 1030 -SLP Stop Time (ACUTE ONLY): 1130 SLP Time Calculation (min) (ACUTE ONLY): 60 min Past Medical History: Past Medical History: Diagnosis Date  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HCC)   Bipolar disorder (HCC)   Complication of anesthesia   Diabetes mellitus without complication (HCC)   Hyperlipidemia   Hypertension   Kidney disease   PONV (postoperative nausea and vomiting)   Sleep apnea   Sleep apnea, unspecified 10/19/2018  Spinal stenosis  Past Surgical History: Past Surgical  History: Procedure Laterality Date  ANTERIOR CERVICAL DECOMP/DISCECTOMY FUSION N/A 10/16/2020  Procedure: ACDF - C3-C4 - C4-C5 - C5-C6;  Surgeon: Tia Alert, MD;  Location: [facility] OR;  Service: Neurosurgery;  Laterality: N/A;  TONSILLECTOMY   HPI: Pt is a 55 y/o male who recently underwent cervical ACDF C3-C6 on 10/3 and discharged home on 10/4.  Patient presented to ER later that day after syncopal episode at home with negative CTH and cervical CT and required scalp laceration repair.  No other complaints, normal ddimer and hemodynamics and therefore discharged home. Pt returned to tje ER again for progressive lethargy at home and was found by EMS with O2 saturations in the 70's, placed on NRB and no improvement to narcan.  Additionally reported decrease oral intake due to ongoing pain and some difficulty swallowing, CT cervical showed slightly increased prevertebral swelling compared to 10/4 and narrowed AP diameter of the larynx the level of the epiglottis to 5 mm, CTH negative for acute intracranial process, and CTA PE was negative for PE, but showed multifocal pneumonia and thickening of esophageal wall possibly reflecting esophagitis, and hepatomegaly.  Subjective: alert Assessment / Plan / Recommendation CHL IP CLINICAL IMPRESSIONS 11/02/2020 Clinical Impression Pt presents with marginally improved swallow function, with improvements related to current mental status, however mechanics of swallowing remain impaired with no obvious etiology. (Prevertebral edema is minimal; CT head negative for acute neuro event).  Pt demonstrates poor pharyngeal squeeze, reduced mobility of hyolaryngeal structures, incomplete epiglottic closure over larynx, and reduced traction on UES for opening. Sluggish movements of structures lead to significant residue throughout pharynx (solids > liquids) and aspiration of thin liquids, penetration of nectars, and potential of spillage of residue from solids.  Pt has good sensation and a  strong cough in response to aspiration. He swallows multiple times with single teaspoon boluses in an effort to transfer material from pharynx through UES, but these sub-swallows are minimally effective and material sits in pharynx.  Severity of dysphagia will not allow Mr. Hanf to meet his nutritional needs via oral intake.  Recommend proceeding with PEG.  Consider allowing nectar-thick liquids to allow opportunities for swallowing and to stave off further deterioration related to disuse atrophy.  Recommend f/u SLP for dysphagia treatment in inpatient rehab.  D/W with B. Boyette from CIR and pt's mother, Nicholes Calamity, who agrees with plan. SLP Visit Diagnosis Dysphagia, oropharyngeal phase (R13.12) Attention and concentration deficit following -- Frontal lobe and executive function deficit following -- Impact on safety and function Moderate aspiration risk   CHL IP TREATMENT RECOMMENDATION 11/02/2020 Treatment Recommendations Therapy as outlined in treatment plan below   Prognosis 11/02/2020 Prognosis for Safe Diet Advancement Fair Barriers to Reach Goals -- Barriers/Prognosis Comment -- CHL IP DIET RECOMMENDATION 11/02/2020 SLP Diet Recommendations Nectar thick liquid Liquid Administration  via Cup Medication Administration Via alternative means Compensations -- Postural Changes --   CHL IP OTHER RECOMMENDATIONS 11/02/2020 Recommended Consults -- Oral Care Recommendations Oral care QID Other Recommendations Order thickener from pharmacy   CHL IP FOLLOW UP RECOMMENDATIONS 11/02/2020 Follow up Recommendations Inpatient Rehab   CHL IP FREQUENCY AND DURATION 11/02/2020 Speech Therapy Frequency (ACUTE ONLY) min 2x/week Treatment Duration --      CHL IP ORAL PHASE 11/02/2020 Oral Phase WFL Oral - Pudding Teaspoon -- Oral - Pudding Cup -- Oral - Honey Teaspoon -- Oral - Honey Cup -- Oral - Nectar Teaspoon WFL Oral - Nectar Cup WFL Oral - Nectar Straw -- Oral - Thin Teaspoon WFL Oral - Thin Cup -- Oral - Thin Straw --  Oral - Puree -- Oral - Mech Soft -- Oral - Regular -- Oral - Multi-Consistency -- Oral - Pill -- Oral Phase - Comment --  CHL IP PHARYNGEAL PHASE 11/02/2020 Pharyngeal Phase Impaired Pharyngeal- Pudding Teaspoon -- Pharyngeal -- Pharyngeal- Pudding Cup -- Pharyngeal -- Pharyngeal- Honey Teaspoon Delayed swallow initiation-pyriform sinuses;Reduced pharyngeal peristalsis;Reduced epiglottic inversion;Reduced anterior laryngeal mobility;Reduced airway/laryngeal closure;Penetration/Aspiration during swallow;Penetration/Apiration after swallow;Trace aspiration;Pharyngeal residue - valleculae;Pharyngeal residue - pyriform Pharyngeal Material enters airway, CONTACTS cords and not ejected out Pharyngeal- Honey Cup -- Pharyngeal -- Pharyngeal- Nectar Teaspoon Delayed swallow initiation-pyriform sinuses;Reduced pharyngeal peristalsis;Reduced epiglottic inversion;Reduced anterior laryngeal mobility;Reduced airway/laryngeal closure;Penetration/Aspiration during swallow;Penetration/Apiration after swallow;Trace aspiration;Pharyngeal residue - valleculae;Pharyngeal residue - pyriform Pharyngeal Material enters airway, remains ABOVE vocal cords and not ejected out Pharyngeal- Nectar Cup NT Pharyngeal -- Pharyngeal- Nectar Straw Delayed swallow initiation-pyriform sinuses;Reduced pharyngeal peristalsis;Reduced epiglottic inversion;Reduced anterior laryngeal mobility;Reduced airway/laryngeal closure;Penetration/Aspiration during swallow;Penetration/Apiration after swallow;Trace aspiration;Pharyngeal residue - valleculae;Pharyngeal residue - pyriform Pharyngeal Material enters airway, remains ABOVE vocal cords and not ejected out Pharyngeal- Thin Teaspoon NT Pharyngeal -- Pharyngeal- Thin Cup -- Pharyngeal -- Pharyngeal- Thin Straw Delayed swallow initiation-pyriform sinuses;Reduced pharyngeal peristalsis;Reduced epiglottic inversion;Reduced anterior laryngeal mobility;Reduced airway/laryngeal closure;Penetration/Aspiration during  swallow;Penetration/Apiration after swallow;Trace aspiration;Pharyngeal residue - valleculae;Pharyngeal residue - pyriform Pharyngeal Material enters airway, passes BELOW cords and not ejected out despite cough attempt by patient Pharyngeal- Puree Delayed swallow initiation-vallecula;Reduced pharyngeal peristalsis;Reduced epiglottic inversion;Reduced anterior laryngeal mobility;Reduced airway/laryngeal closure;Pharyngeal residue - valleculae;Pharyngeal residue - pyriform Pharyngeal -- Pharyngeal- Mechanical Soft -- Pharyngeal -- Pharyngeal- Regular -- Pharyngeal -- Pharyngeal- Multi-consistency -- Pharyngeal -- Pharyngeal- Pill -- Pharyngeal -- Pharyngeal Comment --  No flowsheet data found. Blenda Mounts Laurice 11/02/2020, 12:40 PM                Scheduled Meds:  buPROPion  100 mg Per Tube BID   enoxaparin (LOVENOX) injection  40 mg Subcutaneous Daily   famotidine  20 mg Per Tube BID   FLUoxetine  40 mg Per Tube Daily   free water  200 mL Per Tube Q4H   insulin aspart  0-15 Units Subcutaneous Q4H   insulin glargine-yfgn  35 Units Subcutaneous BID   LORazepam  1 mg Intravenous TID   multivitamin with minerals  1 tablet Per Tube Daily   OLANZapine  2.5 mg Oral Daily   And   OLANZapine  5 mg Oral QHS   polyethylene glycol  17 g Per Tube Daily   rosuvastatin  20 mg Per Tube QHS   senna-docusate  1 tablet Per Tube BID   valproic acid  500 mg Per Tube TID   Continuous Infusions:  feeding supplement (GLUCERNA 1.5 CAL) 1,000 mL (11/03/20 0808)   thiamine injection 200 mg (11/03/20 0954)     LOS:  15 days   Time spent: 25 minutes.   Barnetta Chapel, MD [facility]  If 7PM-7AM, please contact night-coverage [email or web address]  11/03/2020, 12:00 PM

## 2020-11-03 NOTE — Progress Notes (Signed)
Inpatient Rehabilitation Admissions Coordinator   I await PEG tube placement prior to admit to CIR. I will follow up on Monday.  Ottie Glazier, RN, MSN Rehab Admissions Coordinator [phone] 11/03/2020 1:54 PM

## 2020-11-04 DIAGNOSIS — Z4659 Encounter for fitting and adjustment of other gastrointestinal appliance and device: Secondary | ICD-10-CM | POA: Diagnosis not present

## 2020-11-04 DIAGNOSIS — J690 Pneumonitis due to inhalation of food and vomit: Secondary | ICD-10-CM | POA: Diagnosis not present

## 2020-11-04 DIAGNOSIS — E440 Moderate protein-calorie malnutrition: Secondary | ICD-10-CM | POA: Diagnosis not present

## 2020-11-04 DIAGNOSIS — R1310 Dysphagia, unspecified: Secondary | ICD-10-CM | POA: Diagnosis not present

## 2020-11-04 LAB — GLUCOSE, CAPILLARY
Glucose-Capillary: 125 mg/dL — ABNORMAL HIGH (ref 70–99)
Glucose-Capillary: 145 mg/dL — ABNORMAL HIGH (ref 70–99)
Glucose-Capillary: 82 mg/dL (ref 70–99)
Glucose-Capillary: 109 mg/dL — ABNORMAL HIGH (ref 70–99)
Glucose-Capillary: 140 mg/dL — ABNORMAL HIGH (ref 70–99)
Glucose-Capillary: 135 mg/dL — ABNORMAL HIGH (ref 70–99)

## 2020-11-04 MED ORDER — PANCRELIPASE (LIP-PROT-AMYL) 10440-39150 UNITS PO TABS
20880.0000 [IU] | ORAL_TABLET | Freq: Once | ORAL | Status: AC
  Administered 2020-11-04: 20880 [IU]
  Filled 2020-11-04: qty 2

## 2020-11-04 MED ORDER — SODIUM BICARBONATE 650 MG PO TABS
650.0000 mg | ORAL_TABLET | Freq: Once | ORAL | Status: AC
  Administered 2020-11-04: 650 mg
  Filled 2020-11-04: qty 1

## 2020-11-04 NOTE — Progress Notes (Signed)
PROGRESS NOTE    Edward Garner  LXB:[id number] DOB: 1965-09-30 DOA: 10/19/2020 PCP: [name], MD   Brief Narrative: 55 year old with past medical history significant for smoker, OSA, hypertension, hyperlipidemia, diabetes type 2, seizure and cervical spondylosis/stenosis status post ACDF on 10/3 presenting from home with confusion.  Patient recently underwent cervical ACDF C3 C6 with Dr. [name] on 10/3 and discharged home on 10/4.  Patient presented to the ER later that day after a syncopal episode at home with negative CT head and cervical CT, requiring scalp laceration repair.  Subsequently was discharged home.  Patient returned to the ER the day of admission due to progressive lethargy and found by EMS with oxygen saturation in the 70s, he was placed on normal breather mask and received Narcan with significant improvement.  Poor oral intake due to ongoing pain and some difficulty with swallowing. Patient was admitted with multifocal pneumonia.  CTA was negative for PE.  CT cervical which showed slightly increased prevertebral swelling compared to 10/4, P diameter of the larynx the level of the epiglottis to 5 mm.  Significant Hospital events: 10/3 cervical ACDF C3-C6 with Dr. [name], discharged home 10/4 10/4 syncope at home, ER eval neg, discharged home 10/6 Admit to PCCM for acute hypoxic respiratory failure 2/2 multifocal pna, on BiPAP 10/7 On 11L [place]  11/03/2020: Patient seen alongside patient's mother.  Patient is awaiting PEG tube placement.  The plan is for patient to be discharged to CIR.  No complaints today.  11/04/2020: Patient seen alongside patient's mother, sister and patient's nurse.  Patient was noted to have problems with the NG tube (clogged).  NG tube has been flushed.  Patient is still awaiting PEG tube placement.  Otherwise, no new changes.  Assessment & Plan:   Active Problems:   Hypoxemia   Acute respiratory failure with hypoxia (HCC)   Malnutrition of  moderate degree   Delirium due to another medical condition   Polypharmacy   Dysphagia   Aspiration pneumonia (HCC)   Encounter for feeding tube placement  1-Acute Hypoxic respiratory failure in the setting of multifocal pneumonia: Concern for aspiration event with prevertebral swelling status post C-spine fusion -History of OSA on CPAP Continue with core track and n.p.o. with speech following Completed 7 days of Unasyn on 10/26/2020.  Repeat chest x-ray on 10/27/2020 shows persistent bilateral pulmonary infiltrates.  However lungs clear to auscultation on exam and now he is off of oxygen since the night of 10/29/2020 and saturating well over 92% and is comfortable.  Patient had temperature of 100.3 yesterday.  Neurosurgery repeated chest x-ray which is read by radiology as worsening infiltrates however patient has no clinical signs of pneumonia.  Lungs clear to auscultation, no leukocytosis and procalcitonin improved and<0.1.  No indication to treat with antibiotics.  We will watch closely. 11/04/2020: Resolved.    2-Dysphagia: CT with slightly increased prevertebral swelling compared to 10/4 Continue with core track and tube feeding. Speech following.  MBS completed which shows oropharyngeal phase dysphagia and moderate aspiration risk.  Remains NPO.  SLP on board.  Per neurosurgery, his surgery happened more than 2 weeks ago and this should not be the reason for his dysphagia.  Patient had another MBS done today.  His swallow has improved but he still has significant dysphagia of unknown etiology.  SLP has recommended PEG tube placement.  I have consulted IR for that.  Per SLP, patient can still discharge to CIR after PEG tube placement. 11/03/2020: Possible PEG tube placement  today.  Plan is to discharge patient to CIR. 11/04/2020: Awaiting PEG tube placement.  Hypernatremia:; Resolved.  Diabetes type 2: Still elevated.  Will need to increase Semglee from to 35 units twice  daily.  Seizure disorder: Continue with Depakote.   Bipolar: Continue with Prozac and Wellbutrin. Holding Lyrica He reporter  to nurse last night he wants to jump out of window.. -psych recommend continue with bupropion valporic acid and Prozac. -hold adderal and Vyvasne.  -Alprazolam change to ativan.  -Psych recommend schedule pain medication, his pain is very well controlled.  With as needed medications.  Patient started on Zyprexa for steroid-induced agitation on 10/25/2020 and psychiatry does not recommend continuing that at the time of discharge.  Per psychiatry recommendation, I I reduced his Ativan to 1/1/2 milligrams IV on 10/30/2020.  Further managed by psychiatry.  Hypomagnesemia: Resolved.  Hypertension: Labile and mostly low but he is asymptomatic.  Cozaar was discontinued on 10/27/2020 due to low blood pressure.  Metoprolol was also discontinued early morning of 10/31/2020 due to hypotension.  OSA: Patient uses CPAP at night.  Mother brought the machine to the hospital.  Per respiratory therapist note, patient did not need any BiPAP at night since he was resting comfortably.  Intermittent suicidal statements: As per prior documentation: "Patient continues to make suicidal statements intermittently.  He is being seen by psychiatry very closely.  Per psychiatry, his listed meds are not serious as he denies any intent or serious plan.  Psychiatry recommended sitter just for his delirium and not for suicidal prevention.  IVC was also removed several days ago".  Recent C-spine fusion by Dr. [name] 10/4: Neurosurgery following   Nutrition Problem: Moderate Malnutrition Etiology: chronic illness    Signs/Symptoms: moderate fat depletion, moderate muscle depletion    Interventions: MVI, Tube feeding  Estimated body mass index is 21.29 kg/m as calculated from the following:   Height as of this encounter: 6' (1.829 m).   Weight as of this encounter: 71.2 kg.   DVT  prophylaxis: Lovenox Code Status: Full code Family Communication: Mother at bedside Disposition Plan:  Status is: Inpatient  Remains inpatient appropriate because: Meets inpatient criteria.  Dispo: The patient is from: Home              Anticipated d/c is to: Home              Patient currently is medically stable to d/c.   Difficult to place patient No   Consultants:  Neurosurgery   Procedures:    Antimicrobials:    Subjective: Patient seen alongside patient's mother. No new complaints. No fever or chills. No chest pain.  Objective: Vitals:   11/04/20 0200 11/04/20 0757 11/04/20 1145 11/04/20 1531  BP: (!) 87/52 104/63 109/64 (!) 109/58  Pulse:  79 80 81  Resp: 16 16 15 17   Temp:  97.8 F (36.6 C) 97.6 F (36.4 C) 98.2 F (36.8 C)  TempSrc:  Oral Oral Oral  SpO2:      Weight:      Height:       No intake or output data in the 24 hours ending 11/04/20 1735  Filed Weights   10/28/20 0338 10/30/20 0400 11/02/20 0430  Weight: 70.8 kg 74.5 kg 71.2 kg    Examination:  General exam: Appears calm and comfortable  Respiratory system: Clear to auscultation. Respiratory effort normal. Cardiovascular system: S1 & S2 heard, RRR. No JVD, murmurs, rubs, gallops or clicks. No pedal edema. Gastrointestinal system: Abdomen is  nondistended, soft and nontender. No organomegaly or masses felt. Normal bowel sounds heard. Central nervous system: Alert and oriented. No focal neurological deficits. Extremities: Symmetric 5 x 5 power. Skin: No rashes, lesions or ulcers.  Psychiatry: Judgement and insight appear poor  Data Reviewed: I have personally reviewed following labs and imaging studies  CBC: Recent Labs  Lab 11/01/20 1156 11/02/20 1057  WBC 9.5 7.1  NEUTROABS 5.7 2.9  HGB 11.4* 10.7*  HCT 35.2* 31.4*  MCV 96.7 95.2  PLT 546* 571*    Basic Metabolic Panel: Recent Labs  Lab 10/30/20 0815 10/31/20 1112  NA 135 134*  K 4.1 4.1  CL 100 100  CO2 27 26   GLUCOSE 103* 271*  BUN 16 19  CREATININE 0.87 0.93  CALCIUM 8.5* 8.8*    GFR: Estimated Creatinine Clearance: 92.5 mL/min (by C-G formula based on SCr of 0.93 mg/dL). Liver Function Tests: Recent Labs  Lab 10/31/20 1112  AST 28  ALT 42  ALKPHOS 84  BILITOT 0.3  PROT 6.1*  ALBUMIN 1.9*    No results for input(s): LIPASE, AMYLASE in the last 168 hours. Recent Labs  Lab 10/31/20 1112  AMMONIA 25    Coagulation Profile: No results for input(s): INR, PROTIME in the last 168 hours.  Cardiac Enzymes: No results for input(s): CKTOTAL, CKMB, CKMBINDEX, TROPONINI in the last 168 hours. BNP (last 3 results) No results for input(s): PROBNP in the last 8760 hours. HbA1C: No results for input(s): HGBA1C in the last 72 hours. CBG: Recent Labs  Lab 11/03/20 2320 11/04/20 0349 11/04/20 0741 11/04/20 1302 11/04/20 1538  GLUCAP 116* 125* 145* 82 109*    Lipid Profile: No results for input(s): CHOL, HDL, LDLCALC, TRIG, CHOLHDL, LDLDIRECT in the last 72 hours. Thyroid Function Tests: No results for input(s): TSH, T4TOTAL, FREET4, T3FREE, THYROIDAB in the last 72 hours. Anemia Panel: No results for input(s): VITAMINB12, FOLATE, FERRITIN, TIBC, IRON, RETICCTPCT in the last 72 hours. Sepsis Labs: Recent Labs  Lab 11/02/20 1057  PROCALCITON <0.10     No results found for this or any previous visit (from the past 240 hour(s)).         Radiology Studies: CT ABDOMEN WO CONTRAST  Result Date: 11/03/2020 CLINICAL DATA:  Dysphagia.  Possible G-tube placement. EXAM: CT ABDOMEN WITHOUT CONTRAST TECHNIQUE: Multidetector CT imaging of the abdomen was performed following the standard protocol without IV contrast. COMPARISON:  FL swallow study, 11/02/2020. Chest radiograph, 11/01/2020. CTA PE 10/19/2020. CT abdomen pelvis, 10/01/2019. FINDINGS: Lower chest: Dependent bibasilar consolidative opacities, additional radiodensities at the LEFT lung base-likely aspirated. (See key  image) Hepatobiliary: Incidental prominent LEFT hepatic lobe. No focal liver abnormality is seen. No gallstones, gallbladder wall thickening, or biliary dilatation. Pancreas: Normal noncontrast appearance. No pancreatic ductal dilatation or surrounding inflammatory changes. Spleen: Normal in size without focal abnormality. Adrenals/Urinary Tract: Adrenal glands are unremarkable. Absent LEFT kidney. RIGHT renal calculi, largest measuring 0.4 cm. No hydronephrosis. Stomach/Bowel: Enteric feeding tube, with tip at the pylorus. Nonobstructed small bowel. Imaged portions of colon are nondilated. Retained contrast within colon, likely from recent administration. Vascular/Lymphatic: Aortic atherosclerosis. No enlarged abdominal lymph nodes. Other: No abdominal wall hernia or abnormality. Musculoskeletal: No acute osseous abnormality. IMPRESSION: 1. Bibasilar aspiration pneumonia, including retained contrast within the dependent LEFT lung. 2. Prominent LEFT hepatic lobe and high-riding small-bowel loops at the LEFT upper quadrant. Findings are equivocal for feasibility of percutaneous gastrostomy placement, though an attempt with adequate gastric insufflation could be warranted. Roanna Banning, MD [facility] Electronically Signed   By: Roanna Banning M.D.   On: 11/03/2020 11:33     Scheduled Meds:  buPROPion  100 mg Per Tube BID   enoxaparin (LOVENOX) injection  40 mg Subcutaneous Daily   famotidine  20 mg Per Tube BID   FLUoxetine  40 mg Per Tube Daily   free water  200 mL Per Tube Q4H   insulin aspart  0-15 Units Subcutaneous Q4H   insulin glargine-yfgn  35 Units Subcutaneous BID   LORazepam  1 mg Intravenous TID   multivitamin with minerals  1 tablet Per Tube Daily   OLANZapine  2.5 mg Oral Daily   And   OLANZapine  5 mg Oral QHS   polyethylene glycol  17 g Per Tube Daily   rosuvastatin  20 mg Per Tube QHS   senna-docusate  1 tablet Per Tube BID    valproic acid  500 mg Per Tube TID   Continuous Infusions:  feeding supplement (GLUCERNA 1.5 CAL) 1,000 mL (11/04/20 1512)   thiamine injection 200 mg (11/04/20 1505)     LOS: 16 days   Time spent: 25 minutes.   Barnetta Chapel, MD [facility]  If 7PM-7AM, please contact night-coverage [email or web address]  11/04/2020, 5:35 PM

## 2020-11-05 DIAGNOSIS — Z4659 Encounter for fitting and adjustment of other gastrointestinal appliance and device: Secondary | ICD-10-CM | POA: Diagnosis not present

## 2020-11-05 DIAGNOSIS — J9601 Acute respiratory failure with hypoxia: Secondary | ICD-10-CM | POA: Diagnosis not present

## 2020-11-05 DIAGNOSIS — R1310 Dysphagia, unspecified: Secondary | ICD-10-CM | POA: Diagnosis not present

## 2020-11-05 DIAGNOSIS — R0902 Hypoxemia: Secondary | ICD-10-CM | POA: Diagnosis not present

## 2020-11-05 LAB — GLUCOSE, CAPILLARY
Glucose-Capillary: 192 mg/dL — ABNORMAL HIGH (ref 70–99)
Glucose-Capillary: 162 mg/dL — ABNORMAL HIGH (ref 70–99)
Glucose-Capillary: 173 mg/dL — ABNORMAL HIGH (ref 70–99)
Glucose-Capillary: 130 mg/dL — ABNORMAL HIGH (ref 70–99)
Glucose-Capillary: 147 mg/dL — ABNORMAL HIGH (ref 70–99)

## 2020-11-05 MED ORDER — LORAZEPAM 0.5 MG PO TABS
0.5000 mg | ORAL_TABLET | Freq: Three times a day (TID) | ORAL | Status: DC | PRN
  Administered 2020-11-05 – 2020-11-07 (×5): 0.5 mg via ORAL
  Filled 2020-11-05 (×5): qty 1

## 2020-11-05 MED ORDER — LORAZEPAM 2 MG/ML PO CONC: 0.5000 mg | Freq: Three times a day (TID) | ORAL | Status: DC | PRN

## 2020-11-05 NOTE — Progress Notes (Signed)
PROGRESS NOTE    Edward Garner  NFA:[id number] DOB: 1965-06-26 DOA: 10/19/2020 PCP: [name], MD   Brief Narrative: Patient is a 55 year old Caucasian male with past medical history significant for cigarette use, OSA, hypertension, hyperlipidemia, diabetes type 2, seizure and cervical spondylosis/stenosis status post ACDF on 10/16/2020.  Patient was admitted from home with confusion.  Patient recently underwent cervical ACDF C3 C6 by Dr. [name] on 10/16/2020 and discharged home on 10/17/2020.  Patient presented to the ER later same day after a syncopal episode at home.  CT head and cervical spine was negative, patient was discharged back home.  Patient later presented with progressive lethargy, hypoxic respiratory failure with O2 sat in the 70s that responded to Narcan, poor oral intake due to ongoing pain and some difficulty with swallowing.  On presentation, CTA was negative for PE.  CT cervical which showed slightly increased prevertebral swelling compared to 10/17/2020.  Patient was also found to have multifocal pneumonia.,  Significant Hospital events: 10/3 cervical ACDF C3-C6 with Dr. [name], discharged home 10/4 10/4 syncope at home, ER eval neg, discharged home 10/6 Admit to PCCM for acute hypoxic respiratory failure 2/2 multifocal pna, on BiPAP 10/7 On 11L [place]  11/05/2020: Patient seen alongside patient's mother and stepfather.  Patient is awaiting PEG tube placement.  The interventional radiology team has been consulted.  CT scan of the abdomen and pelvis done prior to the PEG placement by the interventional radiology team seems to have revealed enlarged left liver lobe with the presence of small intestine in the way of PEG tube placement.  There will be need to discuss feasibility of PEG tube placement by the interventional radiology team.  If interventional radiology team is not able to place the PEG tube, there may be need to consult the surgical team i.e. if the patient's swallowing  does not continue to improve.  Patient underwent repeat speech evaluation today.  Nectar thick liquid has been recommended.  I suspect that patient swallowing may continue to improve.  The plan is for patient to be discharged to CIR after PEG tube placement.    Assessment & Plan:   Active Problems:   Hypoxemia   Acute respiratory failure with hypoxia (HCC)   Malnutrition of moderate degree   Delirium due to another medical condition   Polypharmacy   Dysphagia   Aspiration pneumonia (HCC)   Encounter for feeding tube placement  1-Acute Hypoxic respiratory failure in the setting of multifocal pneumonia: -Concern for aspiration event with prevertebral swelling status post C-spine fusion -History of OSA on CPAP -Patient completed 7 days of Unasyn on 10/26/2020. -Patient has been off of supplemental oxygen.    2-Dysphagia: -CT with slightly increased prevertebral swelling compared to 10/17/2020 -Continue with core track and tube feeding. Speech following.  Please see above documentation.    Hypernatremia:; Resolved.  Diabetes type 2: Still elevated.  Will need to increase Semglee from to 35 units twice daily.  Seizure disorder: Continue with Depakote.   Bipolar:  -Psychiatric team is directing care. -Patient is currently on Wellbutrin, Prozac, olanzapine and Depakote. -IV Ativan 1 mg 3 times daily has been changed to oral Ativan 0.5 3 times daily as needed today. -Psychiatry input is highly appreciated. -No symptoms of bipolar disorder noted today, 11/05/2020.    Hypomagnesemia: -Resolved.  Hypertension: -Labile and mostly low but he is asymptomatic. -Cozaar was discontinued on 10/27/2020 due to low blood pressure. -Metoprolol was also discontinued early morning of 10/31/2020 due to hypotension. -Minimize mind  altering medications.  OSA: -Patient uses CPAP at night.  Mother brought the machine to the hospital.  Per respiratory therapist note, patient did not need any BiPAP  at night since he was resting comfortably.  Intermittent suicidal statements:  As per prior documentation: "Patient continues to make suicidal statements intermittently.  He is being seen by psychiatry very closely.  Per psychiatry, his listed meds are not serious as he denies any intent or serious plan.  Psychiatry recommended sitter just for his delirium and not for suicidal prevention.  IVC was also removed several days ago".  Recent C-spine fusion by Dr. [name] 10/4: Neurosurgery following   Nutrition Problem: Moderate Malnutrition Etiology: chronic illness    Signs/Symptoms: moderate fat depletion, moderate muscle depletion    Interventions: MVI, Tube feeding  Estimated body mass index is 21.2 kg/m as calculated from the following:   Height as of this encounter: 6' (1.829 m).   Weight as of this encounter: 70.9 kg.   DVT prophylaxis: Lovenox Code Status: Full code Family Communication: Mother at bedside Disposition Plan:  Status is: Inpatient  Remains inpatient appropriate because: Meets inpatient criteria.  Dispo: The patient is from: Home              Anticipated d/c is to: Home              Patient currently is medically stable to d/c.   Difficult to place patient No   Consultants:  Neurosurgery   Procedures:    Antimicrobials:    Subjective: Patient seen alongside patient's mother and stepfather. No new complaints. No fever or chills. No chest pain.  Objective: Vitals:   11/05/20 1135 11/05/20 1222 11/05/20 1246 11/05/20 1540  BP: (!) 93/50 (!) 92/33 95/61 (!) 95/57  Pulse: 85   83  Resp: 16 16 19 15   Temp: 97.9 F (36.6 C)   97.7 F (36.5 C)  TempSrc: Oral   Oral  SpO2:      Weight:      Height:        Intake/Output Summary (Last 24 hours) at 11/05/2020 1710 Last data filed at 11/05/2020 1515 Gross per 24 hour  Intake --  Output 3200 ml  Net -3200 ml    Filed Weights   10/30/20 0400 11/02/20 0430 11/05/20 0500  Weight: 74.5 kg  71.2 kg 70.9 kg    Examination:  General exam: Appears calm and comfortable  Respiratory system: Clear to auscultation. Respiratory effort normal. Cardiovascular system: S1 & S2 heard, RRR. No JVD, murmurs, rubs, gallops or clicks. No pedal edema. Gastrointestinal system: Abdomen is nondistended, soft and nontender. No organomegaly or masses felt. Normal bowel sounds heard. Central nervous system: Alert and oriented. No focal neurological deficits. Extremities: Symmetric 5 x 5 power. Skin: No rashes, lesions or ulcers.  Psychiatry: Judgement and insight appear poor  Data Reviewed: I have personally reviewed following labs and imaging studies  CBC: Recent Labs  Lab 11/01/20 1156 11/02/20 1057  WBC 9.5 7.1  NEUTROABS 5.7 2.9  HGB 11.4* 10.7*  HCT 35.2* 31.4*  MCV 96.7 95.2  PLT 546* 571*    Basic Metabolic Panel: Recent Labs  Lab 10/30/20 0815 10/31/20 1112  NA 135 134*  K 4.1 4.1  CL 100 100  CO2 27 26  GLUCOSE 103* 271*  BUN 16 19  CREATININE 0.87 0.93  CALCIUM 8.5* 8.8*    GFR: Estimated Creatinine Clearance: 92.1 mL/min (by C-G formula based on SCr of 0.93 mg/dL). Liver Function Tests:  Recent Labs  Lab 10/31/20 1112  AST 28  ALT 42  ALKPHOS 84  BILITOT 0.3  PROT 6.1*  ALBUMIN 1.9*    No results for input(s): LIPASE, AMYLASE in the last 168 hours. Recent Labs  Lab 10/31/20 1112  AMMONIA 25    Coagulation Profile: No results for input(s): INR, PROTIME in the last 168 hours.  Cardiac Enzymes: No results for input(s): CKTOTAL, CKMB, CKMBINDEX, TROPONINI in the last 168 hours. BNP (last 3 results) No results for input(s): PROBNP in the last 8760 hours. HbA1C: No results for input(s): HGBA1C in the last 72 hours. CBG: Recent Labs  Lab 11/04/20 2309 11/05/20 0323 11/05/20 0758 11/05/20 1204 11/05/20 1607  GLUCAP 135* 192* 162* 173* 130*    Lipid Profile: No results for input(s): CHOL, HDL, LDLCALC, TRIG, CHOLHDL, LDLDIRECT in the last  72 hours. Thyroid Function Tests: No results for input(s): TSH, T4TOTAL, FREET4, T3FREE, THYROIDAB in the last 72 hours. Anemia Panel: No results for input(s): VITAMINB12, FOLATE, FERRITIN, TIBC, IRON, RETICCTPCT in the last 72 hours. Sepsis Labs: Recent Labs  Lab 11/02/20 1057  PROCALCITON <0.10     No results found for this or any previous visit (from the past 240 hour(s)).         Radiology Studies: No results found.   Scheduled Meds:  buPROPion  100 mg Per Tube BID   enoxaparin (LOVENOX) injection  40 mg Subcutaneous Daily   famotidine  20 mg Per Tube BID   FLUoxetine  40 mg Per Tube Daily   free water  200 mL Per Tube Q4H   insulin aspart  0-15 Units Subcutaneous Q4H   insulin glargine-yfgn  35 Units Subcutaneous BID   multivitamin with minerals  1 tablet Per Tube Daily   OLANZapine  2.5 mg Oral Daily   And   OLANZapine  5 mg Oral QHS   polyethylene glycol  17 g Per Tube Daily   rosuvastatin  20 mg Per Tube QHS   senna-docusate  1 tablet Per Tube BID   valproic acid  500 mg Per Tube TID   Continuous Infusions:  feeding supplement (GLUCERNA 1.5 CAL) 1,000 mL (11/05/20 0859)   thiamine injection 200 mg (11/05/20 0832)     LOS: 17 days   Time spent: 25 minutes.   Barnetta Chapel, MD [facility]  If 7PM-7AM, please contact night-coverage [email or web address]  11/05/2020, 5:10 PM

## 2020-11-05 NOTE — Progress Notes (Signed)
Speech Language Pathology Treatment: Dysphagia  Patient Details Name: Edward Garner MRN: [id number] DOB: 1965/10/28 Today's Date: 11/05/2020 Time: 1100-1120 SLP Time Calculation (min) (ACUTE ONLY): 20 min  Assessment / Plan / Recommendation Clinical Impression  Pt seen with mother at bedside. Pt completed oral care and SLP cued pt to consume teaspoons of nectar thick water with head tilted slightly forward and down. Pt consumed 8 oz of water with occasional slight cough, but no dramatic overt signs of intolerance. Mild aspiration events still expected based on MBS result. SLP spent time reiterating ongoing risk of aspiration and difference between being able to swallow sips of thick water safety and whole meals needed for adequate nutrition. Though pt can hopefully tolerate nectar thick liquids his swallowing is still severely impaired and would not yet be capable of full oral intake. Pt is still recommended to have PEG tube placement if it is possible (mother reports Radiology has identified some complicating factors). Pt and mother educated on process for safest swallowing of nectar thick water. Will f/u frequently.    HPI HPI: Pt is a 55 y/o male who recently underwent cervical ACDF C3-C6 on 10/3 and discharged home on 10/4.  Patient presented to ER later that day after syncopal episode at home with negative CTH and cervical CT and required scalp laceration repair.  No other complaints, normal ddimer and hemodynamics and therefore discharged home. Pt returned to tje ER again for progressive lethargy at home and was found by EMS with O2 saturations in the 70's, placed on NRB and no improvement to narcan.  Additionally reported decrease oral intake due to ongoing pain and some difficulty swallowing, CT cervical showed slightly increased prevertebral swelling compared to 10/4 and narrowed AP diameter of the larynx the level of the epiglottis to 5 mm, CTH negative for acute intracranial process, and  CTA PE was negative for PE, but showed multifocal pneumonia and thickening of esophageal wall possibly reflecting esophagitis, and hepatomegaly.      SLP Plan  Continue with current plan of care      Recommendations for follow up therapy are one component of a multi-disciplinary discharge planning process, led by the attending physician.  Recommendations may be updated based on patient status, additional functional criteria and insurance authorization.    Recommendations  Diet recommendations: Nectar-thick liquid (water) Liquids provided via: Cup;Teaspoon Medication Administration: Via alternative means Supervision: Patient able to self feed;Intermittent supervision to cue for compensatory strategies Compensations: Slow rate;Small sips/bites;Other (Comment) (tilt head slightly forward)                Follow up Recommendations: Inpatient Rehab SLP Visit Diagnosis: Dysphagia, oropharyngeal phase (R13.12) Plan: Continue with current plan of care       GO                Edward Garner, Edward Garner  11/05/2020, 1:21 PM

## 2020-11-06 DIAGNOSIS — J9601 Acute respiratory failure with hypoxia: Secondary | ICD-10-CM | POA: Diagnosis not present

## 2020-11-06 LAB — GLUCOSE, CAPILLARY
Glucose-Capillary: 120 mg/dL — ABNORMAL HIGH (ref 70–99)
Glucose-Capillary: 123 mg/dL — ABNORMAL HIGH (ref 70–99)
Glucose-Capillary: 140 mg/dL — ABNORMAL HIGH (ref 70–99)
Glucose-Capillary: 148 mg/dL — ABNORMAL HIGH (ref 70–99)
Glucose-Capillary: 154 mg/dL — ABNORMAL HIGH (ref 70–99)
Glucose-Capillary: 197 mg/dL — ABNORMAL HIGH (ref 70–99)

## 2020-11-06 MED ORDER — INSULIN GLARGINE-YFGN 100 UNIT/ML ~~LOC~~ SOLN
40.0000 [IU] | Freq: Two times a day (BID) | SUBCUTANEOUS | Status: DC
  Administered 2020-11-06 – 2020-11-07 (×2): 40 [IU] via SUBCUTANEOUS
  Filled 2020-11-06 (×5): qty 0.4

## 2020-11-06 NOTE — Progress Notes (Signed)
PROGRESS NOTE    Edward Garner  VQM:[id number] DOB: 02-Mar-1965 DOA: 10/19/2020 PCP: [name], MD   Brief Narrative: Edward Garner is a 55 year old Caucasian male with past medical history significant for cigarette use, OSA, hypertension, hyperlipidemia, diabetes type 2, seizure and cervical spondylosis/stenosis status post ACDF on 10/16/2020.  Edward Garner was admitted from home with confusion.  Edward Garner recently underwent cervical ACDF C3 C6 by Dr. [name] on 10/16/2020 and discharged home on 10/17/2020.  Edward Garner presented to the ER later same day after a syncopal episode at home.  CT head and cervical spine was negative, Edward Garner was discharged back home.  Edward Garner later presented with progressive lethargy, hypoxic respiratory failure with O2 sat in the 70s that responded to Narcan, poor oral intake due to ongoing pain and some difficulty with swallowing.  On presentation, CTA was negative for PE.  CT cervical which showed slightly increased prevertebral swelling compared to 10/17/2020.  Edward Garner was also found to have multifocal pneumonia.,  Significant Hospital events: 10/3 cervical ACDF C3-C6 with Dr. [name], discharged home 10/4 10/4 syncope at home, ER eval neg, discharged home 10/6 Admit to PCCM for acute hypoxic respiratory failure 2/2 multifocal pna, on BiPAP 10/7 On 11L [place]  11/05/2020: Edward Garner seen alongside Edward Garner's Edward Garner and stepfather.  Edward Garner is awaiting PEG tube placement.  The interventional radiology team has been consulted.  CT scan of the abdomen and pelvis done prior to the PEG placement by the interventional radiology team seems to have revealed enlarged left liver lobe with the presence of small intestine in the way of PEG tube placement.  There will be need to discuss feasibility of PEG tube placement by the interventional radiology team.  If interventional radiology team is not able to place the PEG tube, there may be need to consult the surgical team i.e. if the Edward Garner's swallowing  does not continue to improve.  Edward Garner underwent repeat speech evaluation today.  Nectar thick liquid has been recommended.  I suspect that Edward Garner swallowing may continue to improve.  The plan is for Edward Garner to be discharged to CIR after PEG tube placement.    Assessment & Plan:   Active Problems:   Hypoxemia   Acute respiratory failure with hypoxia (HCC)   Malnutrition of moderate degree   Delirium due to another medical condition   Polypharmacy   Dysphagia   Aspiration pneumonia (HCC)   Encounter for feeding tube placement  1-Acute Hypoxic respiratory failure in the setting of multifocal pneumonia: -Concern for aspiration event with prevertebral swelling status post C-spine fusion -History of OSA on CPAP -Edward Garner completed 7 days of Unasyn on 10/26/2020. -Edward Garner has been off of supplemental oxygen since 10/31/2020.  2-Dysphagia: -CT with slightly increased prevertebral swelling compared to 10/17/2020 -Continue with core track and tube feeding. Speech following.  IR consulted for PEG tube placement.  Per speech, he is not improving enough and they do not expect him to be able to swallow enough nutrition in the meantime and recommend proceeding with PEG tube.  Hypernatremia:; Resolved.  Diabetes type 2: Slightly elevated blood sugar.  Will increase Semglee to 40 units twice daily and continue SSI.  Seizure disorder: Continue with Depakote.   Bipolar:  -Psychiatric team is directing care. -Edward Garner is currently on Wellbutrin, Prozac, olanzapine and Depakote. -IV Ativan 1 mg 3 times daily has been changed to oral Ativan 0.5 3 times daily as needed today. -Psychiatry input is highly appreciated. -No symptoms of bipolar disorder noted today, 11/05/2020.    Hypomagnesemia: -Resolved.  Hypertension: -Labile and mostly low but he is asymptomatic. -Cozaar was discontinued on 10/27/2020 due to low blood pressure. -Metoprolol was also discontinued early morning of 10/31/2020 due to  hypotension. -Minimize mind altering medications.  OSA: -Edward Garner uses CPAP at night.  But refuses intermittently.  Edward Garner seen and examined.  He is alert and oriented.  Edward Garner at the bedside.  He has no complaints.  Intermittent suicidal statements:  As per prior documentation: "Edward Garner continues to make suicidal statements intermittently.  He is being seen by psychiatry very closely.  Per psychiatry, his listed meds are not serious as he denies any intent or serious plan.  Psychiatry recommended sitter just for his delirium and not for suicidal prevention.  IVC was also removed several days ago".  Recent C-spine fusion by Dr. [name] 10/4: Neurosurgery following   Nutrition Problem: Moderate Malnutrition Etiology: chronic illness    Signs/Symptoms: moderate fat depletion, moderate muscle depletion    Interventions: MVI, Tube feeding  Estimated body mass index is 20.96 kg/m as calculated from the following:   Height as of this encounter: 6' (1.829 m).   Weight as of this encounter: 70.1 kg.   DVT prophylaxis: Lovenox Code Status: Full code Family Communication: Edward Garner at bedside Disposition Plan:  Status is: Inpatient  Remains inpatient appropriate because: Meets inpatient criteria.  Dispo: The Edward Garner is from: Home              Anticipated d/c is to: Home              Edward Garner currently is medically stable to d/c.   Difficult to place Edward Garner No   Consultants:  Neurosurgery   Procedures:    Antimicrobials:    Subjective:  Edward Garner seen and examined.  He is alert and oriented.  Edward Garner at the bedside.  He has no complaints.     Objective: Vitals:   11/06/20 0200 11/06/20 0400 11/06/20 0500 11/06/20 0700  BP: 122/78 (!) 94/57 110/69 125/81  Pulse:    84  Resp: 17 16 15 18   Temp:    98.1 F (36.7 C)  TempSrc:    Oral  SpO2:    98%  Weight:   70.1 kg   Height:        Intake/Output Summary (Last 24 hours) at 11/06/2020 1040 Last data filed at 11/06/2020  0500 Gross per 24 hour  Intake --  Output 2500 ml  Net -2500 ml    Filed Weights   11/02/20 0430 11/05/20 0500 11/06/20 0500  Weight: 71.2 kg 70.9 kg 70.1 kg    Examination:  General exam: Appears calm and comfortable  Respiratory system: Clear to auscultation. Respiratory effort normal. Cardiovascular system: S1 & S2 heard, RRR. No JVD, murmurs, rubs, gallops or clicks. No pedal edema. Gastrointestinal system: Abdomen is nondistended, soft and nontender. No organomegaly or masses felt. Normal bowel sounds heard. Central nervous system: Alert and oriented. No focal neurological deficits. Extremities: Symmetric 5 x 5 power. Skin: No rashes, lesions or ulcers.  Psychiatry: Judgement and insight appear poor  Data Reviewed: I have personally reviewed following labs and imaging studies  CBC: Recent Labs  Lab 11/01/20 1156 11/02/20 1057  WBC 9.5 7.1  NEUTROABS 5.7 2.9  HGB 11.4* 10.7*  HCT 35.2* 31.4*  MCV 96.7 95.2  PLT 546* 571*    Basic Metabolic Panel: Recent Labs  Lab 10/31/20 1112  NA 134*  K 4.1  CL 100  CO2 26  GLUCOSE 271*  BUN 19  CREATININE 0.93  CALCIUM 8.8*    GFR: Estimated Creatinine Clearance: 91.1 mL/min (by C-G formula based on SCr of 0.93 mg/dL). Liver Function Tests: Recent Labs  Lab 10/31/20 1112  AST 28  ALT 42  ALKPHOS 84  BILITOT 0.3  PROT 6.1*  ALBUMIN 1.9*    No results for input(s): LIPASE, AMYLASE in the last 168 hours. Recent Labs  Lab 10/31/20 1112  AMMONIA 25    Coagulation Profile: No results for input(s): INR, PROTIME in the last 168 hours.  Cardiac Enzymes: No results for input(s): CKTOTAL, CKMB, CKMBINDEX, TROPONINI in the last 168 hours. BNP (last 3 results) No results for input(s): PROBNP in the last 8760 hours. HbA1C: No results for input(s): HGBA1C in the last 72 hours. CBG: Recent Labs  Lab 11/05/20 1607 11/05/20 2143 11/06/20 0053 11/06/20 0453 11/06/20 0817  GLUCAP 130* 147* 140* 148* 197*     Lipid Profile: No results for input(s): CHOL, HDL, LDLCALC, TRIG, CHOLHDL, LDLDIRECT in the last 72 hours. Thyroid Function Tests: No results for input(s): TSH, T4TOTAL, FREET4, T3FREE, THYROIDAB in the last 72 hours. Anemia Panel: No results for input(s): VITAMINB12, FOLATE, FERRITIN, TIBC, IRON, RETICCTPCT in the last 72 hours. Sepsis Labs: Recent Labs  Lab 11/02/20 1057  PROCALCITON <0.10     No results found for this or any previous visit (from the past 240 hour(s)).         Radiology Studies: No results found.   Scheduled Meds:  buPROPion  100 mg Per Tube BID   enoxaparin (LOVENOX) injection  40 mg Subcutaneous Daily   famotidine  20 mg Per Tube BID   FLUoxetine  40 mg Per Tube Daily   free water  200 mL Per Tube Q4H   insulin aspart  0-15 Units Subcutaneous Q4H   insulin glargine-yfgn  35 Units Subcutaneous BID   multivitamin with minerals  1 tablet Per Tube Daily   OLANZapine  2.5 mg Oral Daily   And   OLANZapine  5 mg Oral QHS   polyethylene glycol  17 g Per Tube Daily   rosuvastatin  20 mg Per Tube QHS   senna-docusate  1 tablet Per Tube BID   valproic acid  500 mg Per Tube TID   Continuous Infusions:  feeding supplement (GLUCERNA 1.5 CAL) 1,000 mL (11/05/20 0859)     LOS: 18 days   Time spent: 26 minutes.   Hughie Closs, MD [facility]  If 7PM-7AM, please contact night-coverage [email or web address]  11/06/2020, 10:40 AM
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
## 2020-11-06 NOTE — Consult Note (Signed)
Chief Complaint: Patient was seen in consultation today for percutaneous gastric tube placement Chief Complaint  Patient presents with   Respiratory Distress   Altered Mental Status   at the request of Dr Dartha Lodge  Supervising Physician: Mir, Mauri Reading  Patient Status: [facility] - In-pt  History of Present Illness: Edward Garner is a 55 y.o. male  Smoker; OSA; HTN; HLD; DM Sz disorder; schizoaffective disorder Recent anterior cervical decom/discectomy 10/19/20  Admitted from home with confusion Poor oral intake-- coretrak in place Dysphagia; malnutrition Risk of aspiration per Speech  Has been able to eat ice chips and sips--- but still high aspiration risk Need for longer term nutrition per speech path Requesting percutaneous G tube placement  Dr Milford Cage has reviewed imaging and approves attempt at percutaneous placement Scheduled for 10/25  Past Medical History:  Diagnosis Date   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HCC)    Bipolar disorder (HCC)    Complication of anesthesia    Diabetes mellitus without complication (HCC)    Hyperlipidemia    Hypertension    Kidney disease    PONV (postoperative nausea and vomiting)    Sleep apnea    Sleep apnea, unspecified 10/19/2018   Spinal stenosis     Past Surgical History:  Procedure Laterality Date   ANTERIOR CERVICAL DECOMP/DISCECTOMY FUSION N/A 10/16/2020   Procedure: ACDF - C3-C4 - C4-C5 - C5-C6;  Surgeon: Tia Alert, MD;  Location: [facility] OR;  Service: Neurosurgery;  Laterality: N/A;   TONSILLECTOMY      Allergies: Gramineae pollens  Medications: Prior to Admission medications   Medication Sig Start Date End Date Taking? Authorizing Provider  ALPRAZolam (XANAX XR) 1 MG 24 hr tablet Take 3 mg by mouth in the morning. 02/03/19  Yes [provider]  amphetamine-dextroamphetamine (ADDERALL) 20 MG tablet Take 20 mg by mouth daily in the afternoon.   Yes [provider]  buPROPion (WELLBUTRIN XL) 300 MG 24 hr tablet  Take 300 mg by mouth every morning. 01/10/19  Yes [provider]  cefUROXime (CEFTIN) 500 MG tablet Take 500 mg by mouth 2 (two) times daily. 7 day supply 10/18/20  Yes [provider]  cycloSPORINE (RESTASIS) 0.05 % ophthalmic emulsion Place 1 drop into both eyes 2 (two) times daily.   Yes [provider]  divalproex (DEPAKOTE ER) 500 MG 24 hr tablet Take 1,500 mg by mouth at bedtime.   Yes [provider]  FLUoxetine (PROZAC) 40 MG capsule Take 80 mg by mouth in the morning.   Yes [provider]  JARDIANCE 10 MG TABS tablet Take 10 mg by mouth daily. 08/09/20  Yes [provider]  lisdexamfetamine (VYVANSE) 50 MG capsule Take 50 mg by mouth in the morning.   Yes [provider]  losartan (COZAAR) 50 MG tablet Take 50 mg by mouth daily.   Yes [provider]  metFORMIN (GLUCOPHAGE) 500 MG tablet Take 2,000 mg by mouth daily with breakfast.   Yes [provider]  methocarbamol (ROBAXIN) 500 MG tablet Take 1 tablet (500 mg total) by mouth every 6 (six) hours as needed for muscle spasms. 10/17/20  Yes Meyran, Tiana Loft, NP  oxyCODONE-acetaminophen (PERCOCET) 10-325 MG tablet Take 1 tablet by mouth every 6 (six) hours as needed for pain. 10/17/20 10/17/21 Yes Meyran, Tiana Loft, NP  prazosin (MINIPRESS) 1 MG capsule Take 1 mg by mouth at bedtime as needed (nightmares). 10/02/20  Yes [provider]  pregabalin (LYRICA) 150 MG capsule Take 100 mg by  mouth in the morning.   Yes [provider]  rosuvastatin (CRESTOR) 20 MG tablet Take 20 mg by mouth daily.   Yes [provider]  tamsulosin (FLOMAX) 0.4 MG CAPS capsule Take 0.4 mg by mouth in the morning. 09/20/20  Yes [provider]  TRULICITY 3 MG/0.5ML SOPN Inject 3 mg into the skin once a week. 08/08/20  Yes [provider]  zolpidem (AMBIEN) 10 MG tablet TAKE 1 TABLET(10 MG) BY MOUTH AT BEDTIME Patient taking differently:  Take 10 mg by mouth at bedtime. 08/25/20  Yes Olalere, Adewale A, MD  glipiZIDE (GLUCOTROL XL) 5 MG 24 hr tablet Take 1 tablet (5 mg total) by mouth daily with breakfast. Patient taking differently: Take 10 mg by mouth daily with breakfast. 05/31/19   Nida, Denman George, MD  TRULICITY 1.5 MG/0.5ML SOPN ADMINISTER 1.5 MG UNDER THE SKIN 1 TIME A WEEK Patient not taking: No sig reported 07/14/19   Roma Kayser, MD     Family History  Problem Relation Age of Onset   Non-Hodgkin's lymphoma Mother    Diabetes Father    Cancer Father    Thyroid disease Father    Hypertension Father    Hyperlipidemia Father    Bipolar disorder Father     Social History   Socioeconomic History   Marital status: Single    Spouse name: Not on file   Number of children: Not on file   Years of education: Not on file   Highest education level: Not on file  Occupational History   Not on file  Tobacco Use   Smoking status: Former    Packs/day: 0.50    Years: 0.50    Pack years: 0.25    Types: Cigarettes    Quit date: 47    Years since quitting: 26.8   Smokeless tobacco: Former    Types: Snuff  Vaping Use   Vaping Use: Never used  Substance and Sexual Activity   Alcohol use: Not Currently   Drug use: Never   Sexual activity: Not on file  Other Topics Concern   Not on file  Social History Narrative   Not on file   Social Determinants of Health   Financial Resource Strain: Not on file  Food Insecurity: Not on file  Transportation Needs: Not on file  Physical Activity: Not on file  Stress: Not on file  Social Connections: Not on file    Review of Systems: A 12 point ROS discussed and pertinent positives are indicated in the HPI above.  All other systems are negative.  Review of Systems  Constitutional:  Positive for activity change. Negative for fatigue and fever.  Respiratory:  Negative for cough and shortness of breath.   Cardiovascular:  Negative for chest pain.   Neurological:  Positive for weakness.  Psychiatric/Behavioral:  Positive for behavioral problems and decreased concentration.    Vital Signs: BP 125/81 (BP Location: Left Arm)   Pulse 84   Temp 98.1 F (36.7 C) (Oral)   Resp 18   Ht 6' (1.829 m)   Wt 154 lb 8.7 oz (70.1 kg)   SpO2 98%   BMI 20.96 kg/m   Physical Exam Vitals reviewed.  HENT:     Mouth/Throat:     Mouth: Mucous membranes are moist.  Cardiovascular:     Rate and Rhythm: Normal rate and regular rhythm.     Heart sounds: Normal heart sounds.  Pulmonary:     Effort: Pulmonary effort is normal.  Breath sounds: Normal breath sounds.  Abdominal:     Palpations: Abdomen is soft.  Musculoskeletal:        General: Normal range of motion.  Skin:    General: Skin is warm.  Neurological:     Mental Status: He is alert and oriented to person, place, and time.  Psychiatric:        Behavior: Behavior normal.     Comments: Pt is able to tell me name; dob and place Consented with pt and mother at bedside    Imaging: CT ABDOMEN WO CONTRAST  Result Date: 11/03/2020 CLINICAL DATA:  Dysphagia.  Possible G-tube placement. EXAM: CT ABDOMEN WITHOUT CONTRAST TECHNIQUE: Multidetector CT imaging of the abdomen was performed following the standard protocol without IV contrast. COMPARISON:  FL swallow study, 11/02/2020. Chest radiograph, 11/01/2020. CTA PE 10/19/2020. CT abdomen pelvis, 10/01/2019. FINDINGS: Lower chest: Dependent bibasilar consolidative opacities, additional radiodensities at the LEFT lung base-likely aspirated. (See key image) Hepatobiliary: Incidental prominent LEFT hepatic lobe. No focal liver abnormality is seen. No gallstones, gallbladder wall thickening, or biliary dilatation. Pancreas: Normal noncontrast appearance. No pancreatic ductal dilatation or surrounding inflammatory changes. Spleen: Normal in size without focal abnormality. Adrenals/Urinary Tract: Adrenal glands are unremarkable. Absent LEFT kidney.  RIGHT renal calculi, largest measuring 0.4 cm. No hydronephrosis. Stomach/Bowel: Enteric feeding tube, with tip at the pylorus. Nonobstructed small bowel. Imaged portions of colon are nondilated. Retained contrast within colon, likely from recent administration. Vascular/Lymphatic: Aortic atherosclerosis. No enlarged abdominal lymph nodes. Other: No abdominal wall hernia or abnormality. Musculoskeletal: No acute osseous abnormality. IMPRESSION: 1. Bibasilar aspiration pneumonia, including retained contrast within the dependent LEFT lung. 2. Prominent LEFT hepatic lobe and high-riding small-bowel loops at the LEFT upper quadrant. Findings are equivocal for feasibility of percutaneous gastrostomy placement, though an attempt with adequate gastric insufflation could be warranted. Roanna Banning, MD [facility] Radiology Electronically Signed   By: Roanna Banning M.D.   On: 11/03/2020 11:33   DG Chest 2 View  Result Date: 11/01/2020 CLINICAL DATA:  Altered mental status and cough EXAM: CHEST - 2 VIEW COMPARISON:  Radiograph 10/27/2020 FINDINGS: Unchanged cardiomediastinal silhouette. There is a nasogastric tube which courses below the diaphragm, tip excluded by collimation. Increased bilateral airspace opacities in the mid to lower lungs. No large pleural effusion or visible pneumothorax. No acute osseous abnormality. Unchanged bilateral shoulder osteoarthritis with multiple large osteochondral joint bodies along the left shoulder. IMPRESSION: Increased bilateral airspace disease in the mid to lower lungs compatible with worsening multifocal pneumonia. Electronically Signed   By: Caprice Renshaw M.D.   On: 11/01/2020 14:00   DG Ribs Unilateral W/Chest Left  Result Date: 10/17/2020 CLINICAL DATA:  Left-sided rib pain after fall. EXAM: LEFT RIBS AND CHEST - 3+ VIEW COMPARISON:  None. FINDINGS: No fracture or other bone lesions are seen involving the ribs. There is no  evidence of pneumothorax or pleural effusion. Both lungs are clear. Heart size and mediastinal contours are within normal limits. Multiple calcified intra-articular bodies in the left shoulder and elbow. IMPRESSION: 1. No acute rib fracture. 2. No acute cardiopulmonary disease. 3. 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 Electronically Signed   By: Obie Dredge M.D.   On: 10/17/2020 18:05   DG Cervical Spine 1 View  Result Date: 11/01/2020 CLINICAL DATA:  Altered mental status and cough, neck pain EXAM: DG CERVICAL SPINE - 1 VIEW COMPARISON:  Cervical spine CT 10/17/2020 FINDINGS: There  is a feeding catheter coursing down the esophagus. Postsurgical changes of C3-6 ACDF. There is severe disc height loss at C6-C7. There is mild to moderate multilevel facet arthropathy. Hardware is intact without evidence of loosening. There is mild residual prevertebral soft tissue swelling. IMPRESSION: C3-C6 ACDF without evidence of hardware complication. Mild residual prevertebral soft tissue swelling. Unchanged severe disc height loss at C6-C7 and mild-to-moderate multilevel facet arthropathy. Electronically Signed   By: Caprice Renshaw M.D.   On: 11/01/2020 13:58   DG Cervical Spine 1 View  Result Date: 10/24/2020 CLINICAL DATA:  Status post cervical fusion EXAM: DG CERVICAL SPINE - 1 VIEW COMPARISON:  10/16/2020 intraoperative radiographs, 09/27/2020 cervical radiographs FINDINGS: The cervical spine is imaged from the skull base through the superior aspect of C6. Redemonstrated ACDF C3-C6. No acute fracture. Alignment is unremarkable. No significant prevertebral edema. Gastric tube is noted overlying the pharynx and esophagus. IMPRESSION: Status post ACDF C3-C6.  No acute abnormality. Electronically Signed   By: Wiliam Ke M.D.   On: 10/24/2020 11:48   DG Cervical Spine 1 View  Result Date: 10/16/2020 CLINICAL DATA:  Cervical fusion  EXAM: DG CERVICAL SPINE - 1 VIEW COMPARISON:  09/27/2020 FLUOROSCOPY TIME:  Radiation Exposure Index (as provided by the fluoroscopic device): 1.49 mGy If the device does not provide the exposure index: Fluoroscopy Time:  17 seconds Number of Acquired Images:  1 FINDINGS: Interbody fusion is noted at C3-4, C4-5 and C5-6 with anterior fixation at all 3 levels. No alignment abnormality is noted. IMPRESSION: Cervical fusion from C3-C6. Electronically Signed   By: Alcide Clever M.D.   On: 10/16/2020 19:29   CT HEAD WO CONTRAST ( )  Result Date: 10/19/2020 CLINICAL DATA:  Head trauma, altered mental status, recent surgery EXAM: CT HEAD WITHOUT CONTRAST TECHNIQUE: Contiguous axial images were obtained from the base of the skull through the vertex without intravenous contrast. COMPARISON:  10/17/2020. FINDINGS: Brain: No evidence of acute infarction, hemorrhage, cerebral edema, mass, mass effect, or midline shift. Ventricles and sulci are within normal limits for age. No extra-axial fluid collection. Vascular: No hyperdense vessel or unexpected calcification. Skull: Normal. Negative for fracture or focal lesion. Sinuses/Orbits: No acute finding. Other: The mastoid air cells are well aerated. IMPRESSION: No acute intracranial process. Electronically Signed   By: Wiliam Ke M.D.   On: 10/19/2020 17:15   CT HEAD WO CONTRAST ( )  Result Date: 10/17/2020 CLINICAL DATA:  Head trauma.  Recent cervical fusion yesterday. EXAM: CT HEAD WITHOUT CONTRAST CT CERVICAL SPINE WITHOUT CONTRAST TECHNIQUE: Multidetector CT imaging of the head and cervical spine was performed following the standard protocol without intravenous contrast. Multiplanar CT image reconstructions of the cervical spine were also generated. COMPARISON:  Cervical spine x-ray 10/16/2020. FINDINGS: CT HEAD FINDINGS Brain: No evidence of acute infarction, hemorrhage, hydrocephalus, extra-axial collection or mass lesion/mass effect. Vascular: No hyperdense  vessel or unexpected calcification. Skull: Normal. Negative for fracture or focal lesion. Sinuses/Orbits: No acute finding. Other: Posterior scalp laceration.  No foreign body. CT CERVICAL SPINE FINDINGS Alignment: Normal. Skull base and vertebrae: No acute fracture. No primary bone lesion or focal pathologic process. Anterior plate and vertebral body screws are seen at C2, C3, C4 and C5. Disc spaces are seen at the surgical levels. Soft tissues and spinal canal: There is some prevertebral soft tissue edema and air as well as air and edema in the right neck and upper mediastinum compatible with recent surgery. No obvious focal fluid collection identified. Central spinal canal soft tissues  within normal limits. Disc levels: There is facet arthropathy at multiple levels. Disc space osteophytes are noted at multiple levels. There is disc space narrowing it C6-C7. There is severe neural foraminal stenosis on the left at C4-C5 secondary to facet arthropathy and uncovertebral spurring. There is severe neural foraminal stenosis on the right at C3-C4 secondary to uncovertebral spurring. There is no significant central canal stenosis. Upper chest: Negative. Other: None. IMPRESSION: 1.  No acute intracranial process. 2. No acute fracture or traumatic subluxation of the cervical spine. 3. Anterior fusion C2-C5 with prevertebral swelling and air as well as edema and air in the right neck compatible with recent surgery. No fluid collection identified. Electronically Signed   By: Darliss Cheney M.D.   On: 10/17/2020 17:07   CT Soft Tissue Neck W Contrast  Result Date: 10/19/2020 CLINICAL DATA:  Trouble swallowing, recent neuro surgery EXAM: CT NECK WITH CONTRAST TECHNIQUE: Multidetector CT imaging of the neck was performed using the standard protocol following the bolus administration of intravenous contrast. CONTRAST:  71mL OMNIPAQUE IOHEXOL 350 MG/ML SOLN COMPARISON:  No prior neck CT, correlation is made with CT cervical  spine 10/17/2020 FINDINGS: Pharynx and larynx: Significant prevertebral swelling, measuring up to 1.6 cm, previously 1.4 cm when remeasured similarly. Small amounts of air are seen within the edematous soft tissues, compatible with recent surgery. This narrows the larynx and effaces the piriform recesses, reducing the AP diameter of the larynx at the level of the epiglottis to 5 mm. No focal fluid collection within the parapharyngeal, retropharyngeal, or prevertebral soft tissues. Salivary glands: No inflammation, mass, or stone. Thyroid: Normal. Lymph nodes: None enlarged or abnormal density. Vascular: Atherosclerotic calcifications at the right greater than left carotid bifurcations. Aortic atherosclerotic calcifications. Otherwise negative. Limited intracranial: Negative. Visualized orbits: Status post bilateral lens replacements. Mastoids and visualized paranasal sinuses: Minimal mucosal thickening in the right maxillary sinus. The mastoids are well aerated. Skeleton: Status post C3-C6 ACDF.  No acute osseous abnormality. Upper chest: For findings in the thorax, please see same day CT chest. Other: Small amount of air seen at the anterior right aspect of the thyroid cartilage, likely postoperative, with decreased subcutaneous air overall compared to the prior exam. IMPRESSION: 1. Slightly increased prevertebral swelling compared to 10/17/2020, with decreased soft tissue gas, consistent with recent ACDF. This narrows the AP diameter of the larynx the level of the epiglottis to 5 mm. No focal collection within the parapharyngeal, retropharyngeal, or prevertebral soft tissues. 2.  For findings in the thorax, please see same day CT chest. Electronically Signed   By: Wiliam Ke M.D.   On: 10/19/2020 17:29   CT Angio Chest PE W and/or Wo Contrast  Result Date: 10/19/2020 CLINICAL DATA:  Recent surgery, hypoxia EXAM: CT ANGIOGRAPHY CHEST WITH CONTRAST TECHNIQUE: Multidetector CT imaging of the chest was  performed using the standard protocol during bolus administration of intravenous contrast. Multiplanar CT image reconstructions and MIPs were obtained to evaluate the vascular anatomy. CONTRAST:  25mL OMNIPAQUE IOHEXOL 350 MG/ML SOLN COMPARISON:  Same day chest radiograph FINDINGS: Cardiovascular: There is adequate opacification of the pulmonary arteries to the segmental level. There is no evidence of pulmonary embolism. The heart is not enlarged. There is no pericardial effusion. There are mild coronary artery calcifications and calcified atherosclerotic plaque of the thoracic aorta. Mediastinum/Nodes: The imaged thyroid is unremarkable. There is apparent thickening of the esophageal wall diffusely, though evaluation is degraded by under distension. There is no mediastinal, hilar, or axillary lymphadenopathy. Lungs/Pleura:  The trachea and central airways are clear. There is consolidation in the lower lobes bilaterally, right worse than left. There is extensive patchy opacity throughout the remainder of the lungs, most prominent in the right upper lobe. There is no pleural effusion or pneumothorax. Upper Abdomen: The liver appears enlarged. No acute abnormality is seen in the upper abdomen. 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 There is no acute osseous abnormality or aggressive osseous lesion. Review of the MIP images confirms the above findings. IMPRESSION: 1. No evidence of pulmonary embolism. 2. Extensive opacities throughout both lungs consistent with multifocal infection. Recommend follow-up radiographs or CT in 6-8 weeks to assess resolution. 3. Apparent thickening of the esophageal wall could reflect esophagitis, though note that evaluation is degraded by under distension. 4. Synovial osteochondromatosis of the left shoulder. 5. Hepatomegaly. Electronically Signed   By: Lesia Hausen M.D.   On: 10/19/2020 17:29   CT Cervical Spine Wo Contrast  Result Date:  10/17/2020 CLINICAL DATA:  Head trauma.  Recent cervical fusion yesterday. EXAM: CT HEAD WITHOUT CONTRAST CT CERVICAL SPINE WITHOUT CONTRAST TECHNIQUE: Multidetector CT imaging of the head and cervical spine was performed following the standard protocol without intravenous contrast. Multiplanar CT image reconstructions of the cervical spine were also generated. COMPARISON:  Cervical spine x-ray 10/16/2020. FINDINGS: CT HEAD FINDINGS Brain: No evidence of acute infarction, hemorrhage, hydrocephalus, extra-axial collection or mass lesion/mass effect. Vascular: No hyperdense vessel or unexpected calcification. Skull: Normal. Negative for fracture or focal lesion. Sinuses/Orbits: No acute finding. Other: Posterior scalp laceration.  No foreign body. CT CERVICAL SPINE FINDINGS Alignment: Normal. Skull base and vertebrae: No acute fracture. No primary bone lesion or focal pathologic process. Anterior plate and vertebral body screws are seen at C2, C3, C4 and C5. Disc spaces are seen at the surgical levels. Soft tissues and spinal canal: There is some prevertebral soft tissue edema and air as well as air and edema in the right neck and upper mediastinum compatible with recent surgery. No obvious focal fluid collection identified. Central spinal canal soft tissues within normal limits. Disc levels: There is facet arthropathy at multiple levels. Disc space osteophytes are noted at multiple levels. There is disc space narrowing it C6-C7. There is severe neural foraminal stenosis on the left at C4-C5 secondary to facet arthropathy and uncovertebral spurring. There is severe neural foraminal stenosis on the right at C3-C4 secondary to uncovertebral spurring. There is no significant central canal stenosis. Upper chest: Negative. Other: None. IMPRESSION: 1.  No acute intracranial process. 2. No acute fracture or traumatic subluxation of the cervical spine. 3. Anterior fusion C2-C5 with prevertebral swelling and air as well as  edema and air in the right neck compatible with recent surgery. No fluid collection identified. Electronically Signed   By: Darliss Cheney M.D.   On: 10/17/2020 17:07   DG CHEST PORT 1 VIEW  Result Date: 10/27/2020 CLINICAL DATA:  Evaluate for pneumonia EXAM: PORTABLE CHEST 1 VIEW COMPARISON:  10/19/2020 FINDINGS: There is a feeding tube with tip well below the level of the GE junction. Stable cardiomediastinal contours. Persistent airspace opacities identified within the right mid and lower lung and retrocardiac left lung base. Bilateral glenohumeral joint osteoarthritis is noted with multiple loose bodies in the left joint space. No acute osseous findings. IMPRESSION: Persistent bilateral pulmonary opacities compatible with multifocal infection. Electronically Signed   By: Signa Kell M.D.   On: 10/27/2020 15:04   DG CHEST PORT 1 VIEW  Result Date: 10/23/2020  CLINICAL DATA:  Hypoxemia EXAM: PORTABLE CHEST 1 VIEW COMPARISON:  10/19/2020 FINDINGS: Single frontal view of the chest demonstrates a stable cardiac silhouette. Enteric catheter passes below diaphragm tip excluded by collimation. There is progressive bibasilar airspace disease. No effusion or pneumothorax. 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 IMPRESSION: 1. Progressive bibasilar airspace disease consistent with multifocal pneumonia. Electronically Signed   By: Sharlet Salina M.D.   On: 10/23/2020 17:22   DG Chest Portable 1 View  Result Date: 10/19/2020 CLINICAL DATA:  Shortness of breath. EXAM: PORTABLE CHEST 1 VIEW COMPARISON:  10/17/2020 FINDINGS: Normal cardiomediastinal contours. Interval development of multifocal bilateral airspace opacities involving the right midlung right base and retrocardiac left lung base. No signs of pleural effusion or edema. Multiple loose bodies are again noted within the left glenohumeral joint. IMPRESSION: Interval development of multifocal bilateral airspace opacities  compatible with multifocal infection. Electronically Signed   By: Signa Kell M.D.   On: 10/19/2020 14:30   DG Shoulder Left  Result Date: 10/17/2020 CLINICAL DATA:  Left shoulder pain after fall. EXAM: LEFT SHOULDER - 2+ VIEW COMPARISON:  Left shoulder x-rays dated November 04, 2019. FINDINGS: No acute fracture or dislocation. Progressive severe glenohumeral joint space narrowing, now with bone-on-bone apposition. Multiple calcified intra-articular bodies again noted. 4.5 cm lucent lesion in the subscapularis recess corresponds to a lipoma seen on prior MRI. Soft tissues are unremarkable. IMPRESSION: 1. No acute osseous abnormality. 2. 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 Electronically Signed   By: Obie Dredge M.D.   On: 10/17/2020 18:07   DG Abd Portable 1V  Result Date: 10/20/2020 CLINICAL DATA:  Evaluate feeding tube placement. EXAM: PORTABLE ABDOMEN - 1 VIEW COMPARISON:  05/25/2020 FINDINGS: Feeding tube tip is below the GE junction in the expected location of the body of stomach. Bilateral airspace opacities are identified within the imaged portions of the lungs. Most advanced within the right upper lobe. IMPRESSION: Feeding tube tip is in the body of the stomach. Electronically Signed   By: Signa Kell M.D.   On: 10/20/2020 11:43   DG Swallowing Func-Speech Pathology  Result Date: 11/02/2020 Table formatting from the original result was not included. Objective Swallowing Evaluation: Type of Study: MBS-Modified Barium Swallow Study  Patient Details Name: Edward Garner MRN: [id number] Date of Birth: [birth year] Today's Date: 11/02/2020 Time: SLP Start Time (ACUTE ONLY): 1030 -SLP Stop Time (ACUTE ONLY): 1130 SLP Time Calculation (min) (ACUTE ONLY): 60 min Past Medical History: Past Medical History: Diagnosis Date  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HCC)   Bipolar disorder (HCC)   Complication of anesthesia   Diabetes mellitus without  complication (HCC)   Hyperlipidemia   Hypertension   Kidney disease   PONV (postoperative nausea and vomiting)   Sleep apnea   Sleep apnea, unspecified 10/19/2018  Spinal stenosis  Past Surgical History: Past Surgical History: Procedure Laterality Date  ANTERIOR CERVICAL DECOMP/DISCECTOMY FUSION N/A 10/16/2020  Procedure: ACDF - C3-C4 - C4-C5 - C5-C6;  Surgeon: Tia Alert, MD;  Location: [facility] OR;  Service: Neurosurgery;  Laterality: N/A;  TONSILLECTOMY   HPI: Pt is a 55 y/o male who recently underwent cervical ACDF C3-C6 on 10/3 and discharged home on 10/4.  Patient presented to ER later that day after syncopal episode at home with negative CTH and cervical CT and required scalp laceration repair.  No other complaints, normal ddimer and hemodynamics and therefore discharged home. Pt returned to tje ER again for progressive lethargy at home and  was found by EMS with O2 saturations in the 70's, placed on NRB and no improvement to narcan.  Additionally reported decrease oral intake due to ongoing pain and some difficulty swallowing, CT cervical showed slightly increased prevertebral swelling compared to 10/4 and narrowed AP diameter of the larynx the level of the epiglottis to 5 mm, CTH negative for acute intracranial process, and CTA PE was negative for PE, but showed multifocal pneumonia and thickening of esophageal wall possibly reflecting esophagitis, and hepatomegaly.  Subjective: alert Assessment / Plan / Recommendation CHL IP CLINICAL IMPRESSIONS 11/02/2020 Clinical Impression Pt presents with marginally improved swallow function, with improvements related to current mental status, however mechanics of swallowing remain impaired with no obvious etiology. (Prevertebral edema is minimal; CT head negative for acute neuro event).  Pt demonstrates poor pharyngeal squeeze, reduced mobility of hyolaryngeal structures, incomplete epiglottic closure over larynx, and reduced traction on UES for opening. Sluggish  movements of structures lead to significant residue throughout pharynx (solids > liquids) and aspiration of thin liquids, penetration of nectars, and potential of spillage of residue from solids.  Pt has good sensation and a strong cough in response to aspiration. He swallows multiple times with single teaspoon boluses in an effort to transfer material from pharynx through UES, but these sub-swallows are minimally effective and material sits in pharynx.  Severity of dysphagia will not allow Mr. Lanese to meet his nutritional needs via oral intake.  Recommend proceeding with PEG.  Consider allowing nectar-thick liquids to allow opportunities for swallowing and to stave off further deterioration related to disuse atrophy.  Recommend f/u SLP for dysphagia treatment in inpatient rehab.  D/W with B. Boyette from CIR and pt's mother, Nicholes Calamity, who agrees with plan. SLP Visit Diagnosis Dysphagia, oropharyngeal phase (R13.12) Attention and concentration deficit following -- Frontal lobe and executive function deficit following -- Impact on safety and function Moderate aspiration risk   CHL IP TREATMENT RECOMMENDATION 11/02/2020 Treatment Recommendations Therapy as outlined in treatment plan below   Prognosis 11/02/2020 Prognosis for Safe Diet Advancement Fair Barriers to Reach Goals -- Barriers/Prognosis Comment -- CHL IP DIET RECOMMENDATION 11/02/2020 SLP Diet Recommendations Nectar thick liquid Liquid Administration via Cup Medication Administration Via alternative means Compensations -- Postural Changes --   CHL IP OTHER RECOMMENDATIONS 11/02/2020 Recommended Consults -- Oral Care Recommendations Oral care QID Other Recommendations Order thickener from pharmacy   CHL IP FOLLOW UP RECOMMENDATIONS 11/02/2020 Follow up Recommendations Inpatient Rehab   CHL IP FREQUENCY AND DURATION 11/02/2020 Speech Therapy Frequency (ACUTE ONLY) min 2x/week Treatment Duration --      CHL IP ORAL PHASE 11/02/2020 Oral Phase WFL Oral -  Pudding Teaspoon -- Oral - Pudding Cup -- Oral - Honey Teaspoon -- Oral - Honey Cup -- Oral - Nectar Teaspoon WFL Oral - Nectar Cup WFL Oral - Nectar Straw -- Oral - Thin Teaspoon WFL Oral - Thin Cup -- Oral - Thin Straw -- Oral - Puree -- Oral - Mech Soft -- Oral - Regular -- Oral - Multi-Consistency -- Oral - Pill -- Oral Phase - Comment --  CHL IP PHARYNGEAL PHASE 11/02/2020 Pharyngeal Phase Impaired Pharyngeal- Pudding Teaspoon -- Pharyngeal -- Pharyngeal- Pudding Cup -- Pharyngeal -- Pharyngeal- Honey Teaspoon Delayed swallow initiation-pyriform sinuses;Reduced pharyngeal peristalsis;Reduced epiglottic inversion;Reduced anterior laryngeal mobility;Reduced airway/laryngeal closure;Penetration/Aspiration during swallow;Penetration/Apiration after swallow;Trace aspiration;Pharyngeal residue - valleculae;Pharyngeal residue - pyriform Pharyngeal Material enters airway, CONTACTS cords and not ejected out Pharyngeal- Honey Cup -- Pharyngeal -- Pharyngeal- Nectar Teaspoon Delayed swallow initiation-pyriform sinuses;Reduced pharyngeal  peristalsis;Reduced epiglottic inversion;Reduced anterior laryngeal mobility;Reduced airway/laryngeal closure;Penetration/Aspiration during swallow;Penetration/Apiration after swallow;Trace aspiration;Pharyngeal residue - valleculae;Pharyngeal residue - pyriform Pharyngeal Material enters airway, remains ABOVE vocal cords and not ejected out Pharyngeal- Nectar Cup NT Pharyngeal -- Pharyngeal- Nectar Straw Delayed swallow initiation-pyriform sinuses;Reduced pharyngeal peristalsis;Reduced epiglottic inversion;Reduced anterior laryngeal mobility;Reduced airway/laryngeal closure;Penetration/Aspiration during swallow;Penetration/Apiration after swallow;Trace aspiration;Pharyngeal residue - valleculae;Pharyngeal residue - pyriform Pharyngeal Material enters airway, remains ABOVE vocal cords and not ejected out Pharyngeal- Thin Teaspoon NT Pharyngeal -- Pharyngeal- Thin Cup -- Pharyngeal --  Pharyngeal- Thin Straw Delayed swallow initiation-pyriform sinuses;Reduced pharyngeal peristalsis;Reduced epiglottic inversion;Reduced anterior laryngeal mobility;Reduced airway/laryngeal closure;Penetration/Aspiration during swallow;Penetration/Apiration after swallow;Trace aspiration;Pharyngeal residue - valleculae;Pharyngeal residue - pyriform Pharyngeal Material enters airway, passes BELOW cords and not ejected out despite cough attempt by patient Pharyngeal- Puree Delayed swallow initiation-vallecula;Reduced pharyngeal peristalsis;Reduced epiglottic inversion;Reduced anterior laryngeal mobility;Reduced airway/laryngeal closure;Pharyngeal residue - valleculae;Pharyngeal residue - pyriform Pharyngeal -- Pharyngeal- Mechanical Soft -- Pharyngeal -- Pharyngeal- Regular -- Pharyngeal -- Pharyngeal- Multi-consistency -- Pharyngeal -- Pharyngeal- Pill -- Pharyngeal -- Pharyngeal Comment --  No flowsheet data found. Blenda Mounts Laurice 11/02/2020, 12:40 PM              DG Swallowing Func-Speech Pathology  Result Date: 10/26/2020 Table formatting from the original result was not included. Objective Swallowing Evaluation: Type of Study: MBS-Modified Barium Swallow Study  Patient Details Name: Edward Garner MRN: [id number] Date of Birth: [birth year] Today's Date: 10/26/2020 Time: SLP Start Time (ACUTE ONLY): 1320 -SLP Stop Time (ACUTE ONLY): 1332 SLP Time Calculation (min) (ACUTE ONLY): 12 min Past Medical History: Past Medical History: Diagnosis Date  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HCC)   Bipolar disorder (HCC)   Complication of anesthesia   Diabetes mellitus without complication (HCC)   Hyperlipidemia   Hypertension   Kidney disease   PONV (postoperative nausea and vomiting)   Sleep apnea   Sleep apnea, unspecified 10/19/2018  Spinal stenosis  Past Surgical History: Past Surgical History: Procedure Laterality Date  ANTERIOR CERVICAL DECOMP/DISCECTOMY FUSION N/A 10/16/2020  Procedure: ACDF - C3-C4 - C4-C5 - C5-C6;  Surgeon:  Tia Alert, MD;  Location: [facility] OR;  Service: Neurosurgery;  Laterality: N/A;  TONSILLECTOMY   HPI: Pt is a 55 y/o male who recently underwent cervical ACDF C3-C6 on 10/3 and discharged home on 10/4.  Patient presented to ER later that day after syncopal episode at home with negative CTH and cervical CT and required scalp laceration repair.  No other complaints, normal ddimer and hemodynamics and therefore discharged home. Pt returned to tje ER again for progressive lethargy at home and was found by EMS with O2 saturations in the 70's, placed on NRB and no improvement to narcan.  Additionally reported decrease oral intake due to ongoing pain and some difficulty swallowing, CT cervical showed slightly increased prevertebral swelling compared to 10/4 and narrowed AP diameter of the larynx the level of the epiglottis to 5 mm, CTH negative for acute intracranial process, and CTA PE was negative for PE, but showed multifocal pneumonia and thickening of esophageal wall possibly reflecting esophagitis, and hepatomegaly.  No data recorded Assessment / Plan / Recommendation CHL IP CLINICAL IMPRESSIONS 10/26/2020 Clinical Impression Pt was alert, but demonstrated difficulty attending to tasks and his implementation of compensatory strategies was variable; the impact of these on his performance is considered. Pt s/p ACDF 10/3 and hardward noted C3-C-6 with the prevetebral edema as noted on CT soft tissue. He presented with oropharyngeal dysphagia characterized by reduced bolus cohesion, reduced pharyngeal constriction, a pharyngeal delay,  reduced hyolaryngeal elavation and reduced anterior laryngeal movement. Some hyolaryngeal elevation was noted but anterior laryngeal movement was reduced and epiglottic inversion was limited, at least partly, due to edema. He was able to swallow very small portions of boluses with repeated swallows, but duration of cricopharyngeal relaxation was reduced. Moderate vallecular residue and  pyriform sinus residue were  noted accross limited trials. Penetration (PAS 3, 5) was noted before deglutition and aspiration (PAS 7,8) noted during and after. Aspiration frequently resulted in coughing which was inconsistently effective propelling the aspirate superior to the vocal folds, but it was often ineffective in fully expelling penetrate/aspirate from the larynx, and a recurrence of aspiration was therefore demonstrated. With a cued effortful swallow and nectar thick liquids via cup, pt was once able to demonstrate improved laryngeal vestibule closure and increased airway protection. This could not be replicated, but this reduced the amount of laryngeal invasion and resulted in there only being laryngeal invasion after the swallow. Pt is at risk for aspiration before, during and after deglutition due to the pharyngeal delay, premature spillage, reduced airway protection, and pharyngeal residue. It is recommended that the NPO status be maitained with continued allowance of ice chips following oral care. SLP will continue to follow pt for treatment, but is anticipated that improvement in the pharyngeal edema will facilitate some increased epiglottic inversion and reduce aspiration risk. SLP Visit Diagnosis Dysphagia, oropharyngeal phase (R13.12) Attention and concentration deficit following -- Frontal lobe and executive function deficit following -- Impact on safety and function Moderate aspiration risk   CHL IP TREATMENT RECOMMENDATION 10/26/2020 Treatment Recommendations Therapy as outlined in treatment plan below   Prognosis 10/26/2020 Prognosis for Safe Diet Advancement Good Barriers to Reach Goals Severity of deficits Barriers/Prognosis Comment -- CHL IP DIET RECOMMENDATION 10/26/2020 SLP Diet Recommendations NPO;Ice chips PRN after oral care Liquid Administration via -- Medication Administration Via alternative means Compensations -- Postural Changes Seated upright at 90 degrees   CHL IP OTHER  RECOMMENDATIONS 10/26/2020 Recommended Consults -- Oral Care Recommendations Oral care QID Other Recommendations --   CHL IP FOLLOW UP RECOMMENDATIONS 10/26/2020 Follow up Recommendations None   CHL IP FREQUENCY AND DURATION 10/26/2020 Speech Therapy Frequency (ACUTE ONLY) min 2x/week Treatment Duration 2 weeks      CHL IP ORAL PHASE 10/26/2020 Oral Phase Impaired Oral - Pudding Teaspoon -- Oral - Pudding Cup -- Oral - Honey Teaspoon -- Oral - Honey Cup -- Oral - Nectar Teaspoon Decreased bolus cohesion;Premature spillage Oral - Nectar Cup Decreased bolus cohesion;Premature spillage Oral - Nectar Straw -- Oral - Thin Teaspoon Decreased bolus cohesion;Premature spillage Oral - Thin Cup -- Oral - Thin Straw -- Oral - Puree -- Oral - Mech Soft -- Oral - Regular -- Oral - Multi-Consistency -- Oral - Pill -- Oral Phase - Comment --  CHL IP PHARYNGEAL PHASE 10/26/2020 Pharyngeal Phase Impaired Pharyngeal- Pudding Teaspoon -- Pharyngeal -- Pharyngeal- Pudding Cup -- Pharyngeal -- Pharyngeal- Honey Teaspoon -- Pharyngeal -- Pharyngeal- Honey Cup -- Pharyngeal -- Pharyngeal- Nectar Teaspoon Penetration/Aspiration before swallow;Penetration/Aspiration during swallow;Penetration/Apiration after swallow;Trace aspiration;Pharyngeal residue - valleculae;Pharyngeal residue - pyriform;Pharyngeal residue - posterior pharnyx;Reduced laryngeal elevation;Reduced airway/laryngeal closure;Reduced anterior laryngeal mobility;Reduced epiglottic inversion Pharyngeal Material enters airway, remains ABOVE vocal cords and not ejected out;Material enters airway, CONTACTS cords and not ejected out;Material enters airway, passes BELOW cords and not ejected out despite cough attempt by patient Pharyngeal- Nectar Cup Penetration/Aspiration before swallow;Penetration/Aspiration during swallow;Penetration/Apiration after swallow;Trace aspiration;Pharyngeal residue - valleculae;Pharyngeal residue - pyriform;Pharyngeal residue - posterior  pharnyx;Reduced  laryngeal elevation;Reduced airway/laryngeal closure;Reduced anterior laryngeal mobility;Reduced epiglottic inversion;Moderate aspiration Pharyngeal Material enters airway, remains ABOVE vocal cords and not ejected out;Material enters airway, CONTACTS cords and not ejected out;Material enters airway, passes BELOW cords and not ejected out despite cough attempt by patient Pharyngeal- Nectar Straw -- Pharyngeal -- Pharyngeal- Thin Teaspoon Penetration/Aspiration before swallow;Penetration/Aspiration during swallow;Penetration/Apiration after swallow;Trace aspiration;Pharyngeal residue - valleculae;Pharyngeal residue - pyriform;Pharyngeal residue - posterior pharnyx;Reduced laryngeal elevation;Reduced airway/laryngeal closure;Reduced anterior laryngeal mobility;Reduced epiglottic inversion Pharyngeal Material enters airway, remains ABOVE vocal cords and not ejected out;Material enters airway, CONTACTS cords and not ejected out;Material enters airway, passes BELOW cords and not ejected out despite cough attempt by patient Pharyngeal- Thin Cup -- Pharyngeal -- Pharyngeal- Thin Straw -- Pharyngeal -- Pharyngeal- Puree -- Pharyngeal -- Pharyngeal- Mechanical Soft -- Pharyngeal -- Pharyngeal- Regular -- Pharyngeal -- Pharyngeal- Multi-consistency -- Pharyngeal -- Pharyngeal- Pill -- Pharyngeal -- Pharyngeal Comment --  Shanika I. Vear Clock, MS, CCC-SLP Acute Rehabilitation Services Office number [phone] Pager [phone] Scheryl Marten 10/26/2020, 3:36 PM              DG C-Arm 1-60 Min-No Report  Result Date: 10/16/2020 Fluoroscopy was utilized by the requesting physician.  No radiographic interpretation.   DG C-Arm 1-60 Min-No Report  Result Date: 10/16/2020 Fluoroscopy was utilized by the requesting physician.  No radiographic interpretation.    Labs:  CBC: Recent Labs    10/26/20 0239 10/27/20 0402 11/01/20 1156 11/02/20 1057  WBC 13.7* 15.3* 9.5 7.1  HGB 11.2* 10.7* 11.4*  10.7*  HCT 34.0* 32.4* 35.2* 31.4*  PLT 365 338 546* 571*    COAGS: Recent Labs    10/16/20 1112  INR 1.0    BMP: Recent Labs    10/26/20 0239 10/27/20 0402 10/30/20 0815 10/31/20 1112  NA 146* 143 135 134*  K 4.0 3.9 4.1 4.1  CL 103 103 100 100  CO2 37* 32 27 26  GLUCOSE 199* 152* 103* 271*  BUN 21* CALCIUM 9.2 9.2 8.5* 8.8*  CREATININE 0.85 0.81 0.87 0.93  GFRNONAA >60 >60 >60 >60    LIVER FUNCTION TESTS: Recent Labs    10/19/20 1421 10/24/20 0618 10/31/20 1112  BILITOT 1.0 0.4 0.3  AST 19 58* 28  ALT 14 127* 42  ALKPHOS 46 96 84  PROT 5.8* 5.8* 6.1*  ALBUMIN 2.7* 2.0* 1.9*    TUMOR MARKERS: No results for input(s): AFPTM, CEA, CA199, CHROMGRNA in the last 8760 hours.  Assessment and Plan:  Dysphagia Aspiration risk is high Poor oral intake Scheduled for percutaneous gastric tube placement Risks and benefits image guided gastrostomy tube placement was discussed with the patient and mother at bedside including, but not limited to the need for a barium enema during the procedure, bleeding, infection, peritonitis and/or damage to adjacent structures.  All of the patient's questions were answered, patient and mother are  agreeable to proceed. Consent signed and in chart.   Thank you for this interesting consult.  I greatly enjoyed meeting Cire Clute and look forward to participating in their care.  A copy of this report was sent to the requesting provider on this date.  Electronically Signed: Robet Leu, PA-C 11/06/2020, 11:23 AM   I spent a total of 40 Minutes    in face to face in clinical consultation, greater than 50% of which was counseling/coordinating care for percutaneous gastric tube placement

## 2020-11-06 NOTE — Progress Notes (Addendum)
Physical Therapy Treatment Patient Details Name: Edward Garner MRN: [id number] DOB: 1965/01/19 Today's Date: 11/06/2020   History of Present Illness Pt is a 55 y.o. male admitted 10/19/20 with AMS, lethargy, hypoxia; workup for multifocal PNA. Head CT negative for acute abnormality. Of note, recent admission 10/3-10/4/22 for C3-6 ACDF; brief ER visit 10/4 for syncope. Other PMH includes DM, HTN, HLD, OSA, seizure, schizoaffective disorder.    PT Comments    Patient progressing with mobility and activity tolerance with increased distance with ambulation and slight balance and cognitive improvements.  Still impulsive and high risk for falls and some orthostatic readings, though pt reports no symptoms.  Goals updated this session.  Patient remains appropriate for inpatient rehab at d/c.  PT to continue to follow acutely.    Recommendations for follow up therapy are one component of a multi-disciplinary discharge planning process, led by the attending physician.  Recommendations may be updated based on patient status, additional functional criteria and insurance authorization.  Follow Up Recommendations  Acute inpatient rehab (3hours/day)     Assistance Recommended at Discharge Frequent or constant Supervision/Assistance  Equipment Recommendations  Rolling walker (2 wheels)    Recommendations for Other Services       Precautions / Restrictions Precautions Precautions: Fall;Cervical Precaution Comments: monitor BP Required Braces or Orthoses: Cervical Brace Cervical Brace: Soft collar;For comfort     Mobility  Bed Mobility Overal bed mobility: Needs Assistance Bed Mobility: Supine to Sit     Supine to sit: Supervision;HOB elevated     General bed mobility comments: up and scooting forward from elevated bed prior to environmental set up, placing legs over EOB with rail still up; cues to stop and wait and slow down    Transfers Overall transfer level: Needs  assistance Equipment used: Rolling walker (2 wheels) Transfers: Sit to/from Stand Sit to Stand: Min guard           General transfer comment: assist for safety up from EOB. pt pulling up on RW    Ambulation/Gait Ambulation/Gait assistance: Min assist Gait Distance (Feet): 120 Feet Assistive device: Rolling walker (2 wheels) Gait Pattern/deviations: Step-through pattern;Decreased stride length;Drifts right/left;Narrow base of support;Ataxic     General Gait Details: mild incoordination with walker use and cues for forward progression as veering to R and L in hallway; no c/o dizziness with ambulation, but close monitoring due to low BP   Stairs             Wheelchair Mobility    Modified Rankin (Stroke Patients Only)       Balance Overall balance assessment: Needs assistance Sitting-balance support: Feet supported Sitting balance-Leahy Scale: Good Sitting balance - Comments: no LOB sitting EOB during BP measurements and fixing socks with S mild lateral leaning without assist to correct   Standing balance support: Bilateral upper extremity supported Standing balance-Leahy Scale: Poor Standing balance comment: walker and min guard for static standing                            Cognition Arousal/Alertness: Awake/alert Behavior During Therapy: Impulsive Overall Cognitive Status: Impaired/Different from baseline Area of Impairment: Safety/judgement;Attention;Following commands                   Current Attention Level: Selective Memory: Decreased short-term memory Following Commands: Follows one step commands consistently;Follows multi-step commands inconsistently;Follows multi-step commands with increased time Safety/Judgement: Decreased awareness of safety;Decreased awareness of deficits Awareness: Emergent  Exercises Other Exercises Other Exercises: sit<>stand x 5 with UE support and minguard A no device    General Comments  General comments (skin integrity, edema, etc.): BP sitting up in bed 132/67; standing 90/44; after ambulation sitting 96/64; mother in the room and supportive      Pertinent Vitals/Pain Pain Assessment: No/denies pain (reports had medications this morning)    Home Living                          Prior Function            PT Goals (current goals can now be found in the care plan section) Acute Rehab PT Goals Patient Stated Goal: to walk in a pizza joint PT Goal Formulation: With patient Time For Goal Achievement: 11/20/20 Potential to Achieve Goals: Good Progress towards PT goals: Progressing toward goals    Frequency    Min 3X/week      PT Plan Current plan remains appropriate    Co-evaluation              AM-PAC PT "6 Clicks" Mobility   Outcome Measure  Help needed turning from your back to your side while in a flat bed without using bedrails?: A Little Help needed moving from lying on your back to sitting on the side of a flat bed without using bedrails?: A Little Help needed moving to and from a bed to a chair (including a wheelchair)?: A Little Help needed standing up from a chair using your arms (e.g., wheelchair or bedside chair)?: A Little Help needed to walk in hospital room?: A Little Help needed climbing 3-5 steps with a railing? : A Lot 6 Click Score: 17    End of Session Equipment Utilized During Treatment: Gait belt Activity Tolerance: Patient tolerated treatment well Patient left: in chair;with call bell/phone within reach;with family/visitor present   PT Visit Diagnosis: Other abnormalities of gait and mobility (R26.89);History of falling (Z91.81);Muscle weakness (generalized) (M62.81);Other symptoms and signs involving the nervous system (R29.898)     Time: 1010-1034 PT Time Calculation (min) (ACUTE ONLY): 24 min  Charges:  $Gait Training: 8-22 mins $Therapeutic Activity: 8-22 mins                     Sheran Lawless, PT Acute  Rehabilitation Services Pager:[phone] Office:[phone] 11/06/2020    Edward Garner 11/06/2020, 11:00 AM

## 2020-11-06 NOTE — Progress Notes (Signed)
Inpatient Rehabilitation Admissions Coordinator   Noted plans for PEG in IR 10/25. I will follow.  Ottie Glazier, RN, MSN Rehab Admissions Coordinator [phone] 11/06/2020 4:11 PM

## 2020-11-06 NOTE — Progress Notes (Signed)
Patient ID: Edward Garner, male   DOB: 1965/08/02, 55 y.o.   MRN: [id number]    Came to talk to pt and mother regarding percutaneous G tube placement in IR  He wants to talk with MD and Speech-- about another swallow eval before he commits to G tube placement  I told them I would check back later today or tomorrow Will move ahead when they are ready-- unless Speech says no need

## 2020-11-06 NOTE — Progress Notes (Signed)
Speech Language Pathology Treatment: Dysphagia  Patient Details Name: Edward Garner MRN: [id number] DOB: 09-18-1965 Today's Date: 11/06/2020 Time: 1030-1050 SLP Time Calculation (min) (ACUTE ONLY): 20 min  Assessment / Plan / Recommendation Clinical Impression  Pt demonstrates stable appearing swallow function subjectively. He is eating ice and sips of nectar thick water quite rapidly with multiple swallows and throat clearing, consistent with prior observations. No concern at this time for increased coughing or congestion that would indicate severe aspiration. No expectation that swallow has dramatically changed from Fridays assessment. Pt would continue to benefit from stable alternate source of nutrition during recovery. Reinforced this plan with pt and mother as well as MD. Will plan to allow nectar thick liquids beyond water after PEG tube placement tomorrow.   HPI HPI: Pt is a 55 y/o male who recently underwent cervical ACDF C3-C6 on 10/3 and discharged home on 10/4.  Patient presented to ER later that day after syncopal episode at home with negative CTH and cervical CT and required scalp laceration repair.  No other complaints, normal ddimer and hemodynamics and therefore discharged home. Pt returned to tje ER again for progressive lethargy at home and was found by EMS with O2 saturations in the 70's, placed on NRB and no improvement to narcan.  Additionally reported decrease oral intake due to ongoing pain and some difficulty swallowing, CT cervical showed slightly increased prevertebral swelling compared to 10/4 and narrowed AP diameter of the larynx the level of the epiglottis to 5 mm, CTH negative for acute intracranial process, and CTA PE was negative for PE, but showed multifocal pneumonia and thickening of esophageal wall possibly reflecting esophagitis, and hepatomegaly.      SLP Plan  Continue with current plan of care      Recommendations for follow up therapy are one  component of a multi-disciplinary discharge planning process, led by the attending physician.  Recommendations may be updated based on patient status, additional functional criteria and insurance authorization.    Recommendations  Diet recommendations: Nectar-thick liquid                Follow up Recommendations: Inpatient Rehab SLP Visit Diagnosis: Dysphagia, oropharyngeal phase (R13.12) Plan: Continue with current plan of care       GO               Harlon Ditty, MA CCC-SLP  Acute Rehabilitation Services Office [phone]   Claudine Mouton  11/06/2020, 1:43 PM

## 2020-11-07 DIAGNOSIS — J9601 Acute respiratory failure with hypoxia: Secondary | ICD-10-CM | POA: Diagnosis not present

## 2020-11-07 DIAGNOSIS — R1310 Dysphagia, unspecified: Secondary | ICD-10-CM | POA: Diagnosis not present

## 2020-11-07 DIAGNOSIS — J690 Pneumonitis due to inhalation of food and vomit: Secondary | ICD-10-CM | POA: Diagnosis not present

## 2020-11-07 LAB — GLUCOSE, CAPILLARY
Glucose-Capillary: 102 mg/dL — ABNORMAL HIGH (ref 70–99)
Glucose-Capillary: 102 mg/dL — ABNORMAL HIGH (ref 70–99)
Glucose-Capillary: 108 mg/dL — ABNORMAL HIGH (ref 70–99)
Glucose-Capillary: 143 mg/dL — ABNORMAL HIGH (ref 70–99)
Glucose-Capillary: 168 mg/dL — ABNORMAL HIGH (ref 70–99)
Glucose-Capillary: 186 mg/dL — ABNORMAL HIGH (ref 70–99)
Glucose-Capillary: 90 mg/dL (ref 70–99)

## 2020-11-07 MED ORDER — ENOXAPARIN SODIUM 40 MG/0.4ML IJ SOSY
40.0000 mg | PREFILLED_SYRINGE | Freq: Every day | INTRAMUSCULAR | Status: DC
  Administered 2020-11-09 – 2020-11-10 (×2): 40 mg via SUBCUTANEOUS
  Filled 2020-11-07 (×2): qty 0.4

## 2020-11-07 MED ORDER — LORAZEPAM 0.5 MG PO TABS
0.5000 mg | ORAL_TABLET | Freq: Two times a day (BID) | ORAL | Status: DC | PRN
  Administered 2020-11-08: 0.5 mg via ORAL
  Filled 2020-11-07: qty 1

## 2020-11-07 MED ORDER — LORAZEPAM 0.5 MG PO TABS: 0.5000 mg | ORAL_TABLET | Freq: Two times a day (BID) | ORAL | Status: DC

## 2020-11-07 MED ORDER — DIPHENHYDRAMINE HCL 12.5 MG/5ML PO ELIX
25.0000 mg | ORAL_SOLUTION | Freq: Once | ORAL | Status: AC | PRN
  Administered 2020-11-07: 25 mg
  Filled 2020-11-07: qty 10

## 2020-11-07 NOTE — Progress Notes (Signed)
Attempted to bring pt down for GT placement. Tube feedings not held. Pt to be NPO with feedings on hold at midnight and we will bring pt down tomorrow for GT placement

## 2020-11-07 NOTE — Progress Notes (Signed)
Glucometer did not register pts cbg to the chart  Morning: 168 Afternoon 90 Evening, 102

## 2020-11-07 NOTE — Progress Notes (Signed)
PROGRESS NOTE    Edward Garner  BVQ:[id number] DOB: [birth year] DOA: 10/19/2020 PCP: [name], MD   Brief Narrative: Patient is a 55 year old Caucasian male with past medical history significant for cigarette use, OSA, hypertension, hyperlipidemia, diabetes type 2, seizure and cervical spondylosis/stenosis status post ACDF on 10/16/2020.  Patient was admitted from home with confusion.  Patient recently underwent cervical ACDF C3 C6 by Dr. [name] on 10/16/2020 and discharged home on 10/17/2020.  Patient presented to the ER later same day after a syncopal episode at home.  CT head and cervical spine was negative, patient was discharged back home.  Patient later presented with progressive lethargy, hypoxic respiratory failure with O2 sat in the 70s that responded to Narcan, poor oral intake due to ongoing pain and some difficulty with swallowing.  On presentation, CTA was negative for PE.  CT cervical which showed slightly increased prevertebral swelling compared to 10/17/2020.  Patient was also found to have multifocal pneumonia.,  Significant Hospital events: 10/3 cervical ACDF C3-C6 with Dr. [name], discharged home 10/4 10/4 syncope at home, ER eval neg, discharged home 10/6 Admit to PCCM for acute hypoxic respiratory failure 2/2 multifocal pna, on BiPAP 10/7 On 11L [place]  11/05/2020: Patient seen alongside patient's mother and stepfather.  Patient is awaiting PEG tube placement.  The interventional radiology team has been consulted.  CT scan of the abdomen and pelvis done prior to the PEG placement by the interventional radiology team seems to have revealed enlarged left liver lobe with the presence of small intestine in the way of PEG tube placement.  There will be need to discuss feasibility of PEG tube placement by the interventional radiology team.  If interventional radiology team is not able to place the PEG tube, there may be need to consult the surgical team i.e. if the patient's swallowing  does not continue to improve.  Patient underwent repeat speech evaluation today.  Nectar thick liquid has been recommended.  I suspect that patient swallowing may continue to improve.  The plan is for patient to be discharged to CIR after PEG tube placement.    Assessment & Plan:   Active Problems:   Hypoxemia   Acute respiratory failure with hypoxia (HCC)   Malnutrition of moderate degree   Delirium due to another medical condition   Polypharmacy   Dysphagia   Aspiration pneumonia (HCC)   Encounter for feeding tube placement  1-Acute Hypoxic respiratory failure in the setting of multifocal pneumonia: -Concern for aspiration event with prevertebral swelling status post C-spine fusion -History of OSA on CPAP -Patient completed 7 days of Unasyn on 10/26/2020. -Patient has been off of supplemental oxygen since 10/31/2020.  2-Dysphagia: -CT with slightly increased prevertebral swelling compared to 10/17/2020 -Continue with core track and tube feeding. Speech following.  IR consulted for PEG tube placement.  He is scheduled to have PEG tube today.  Hypernatremia:; Resolved.  Diabetes type 2: Slightly elevated blood sugar.  Continue Semglee to 40 units twice daily and continue SSI.  Seizure disorder: Continue with Depakote.   Bipolar:  -Psychiatric team is directing care. -Patient is currently on Wellbutrin, Prozac, olanzapine and Depakote. -IV Ativan 1 mg 3 times daily has been changed to oral Ativan 0.5 3 times daily as needed today. -Psychiatry input is highly appreciated. -No symptoms of bipolar disorder noted today, 11/05/2020.    Hypomagnesemia: -Resolved.  Hypertension: -Labile and mostly low but he is asymptomatic. -Cozaar was discontinued on 10/27/2020 due to low blood pressure. -Metoprolol was also  discontinued early morning of 10/31/2020 due to hypotension. -Minimize mind altering medications.  OSA: -Patient uses CPAP at night.  But refuses intermittently.   Patient seen and examined.  He is alert and oriented.  Mother at the bedside.  He has no complaints.  Intermittent suicidal statements:  As per prior documentation: "Patient continues to make suicidal statements intermittently.  He is being seen by psychiatry very closely.  Per psychiatry, his listed meds are not serious as he denies any intent or serious plan.  Psychiatry recommended sitter just for his delirium and not for suicidal prevention.  IVC was also removed several days ago".  Recent C-spine fusion by Dr. [name] 10/4: Neurosurgery following   Nutrition Problem: Moderate Malnutrition Etiology: chronic illness    Signs/Symptoms: moderate fat depletion, moderate muscle depletion    Interventions: MVI, Tube feeding  Estimated body mass index is 21.38 kg/m as calculated from the following:   Height as of this encounter: 6' (1.829 m).   Weight as of this encounter: 71.5 kg.   DVT prophylaxis: Lovenox Code Status: Full code Family Communication: Mother at bedside Disposition Plan:  Status is: Inpatient  Remains inpatient appropriate because: Meets inpatient criteria.  Dispo: The patient is from: Home              Anticipated d/c is to: Home              Patient currently is medically stable to d/c.   Difficult to place patient No   Consultants:  Neurosurgery   Procedures:    Antimicrobials:    Subjective:  Patient seen and examined.  He has no complaints.  Mother at the bedside.  He is ready for PEG tube today.   Objective: Vitals:   11/06/20 0700 11/06/20 1932 11/07/20 0440 11/07/20 0736  BP: 125/81 116/70  95/69  Pulse: 84 88    Resp: 18 18  17   Temp: 98.1 F (36.7 C) 97.6 F (36.4 C)  98.1 F (36.7 C)  TempSrc: Oral Oral  Axillary  SpO2: 98% 94%    Weight:   71.5 kg   Height:        Intake/Output Summary (Last 24 hours) at 11/07/2020 1050 Last data filed at 11/07/2020 0400 Gross per 24 hour  Intake 78.42 ml  Output 2200 ml  Net -2121.58  ml    Filed Weights   11/05/20 0500 11/06/20 0500 11/07/20 0440  Weight: 70.9 kg 70.1 kg 71.5 kg    Examination:  General exam: Appears calm and comfortable  Respiratory system: Clear to auscultation. Respiratory effort normal. Cardiovascular system: S1 & S2 heard, RRR. No JVD, murmurs, rubs, gallops or clicks. No pedal edema. Gastrointestinal system: Abdomen is nondistended, soft and nontender. No organomegaly or masses felt. Normal bowel sounds heard. Central nervous system: Alert and oriented. No focal neurological deficits. Extremities: Symmetric 5 x 5 power. Skin: No rashes, lesions or ulcers.  Psychiatry: Judgement and insight appear poor  Data Reviewed: I have personally reviewed following labs and imaging studies  CBC: Recent Labs  Lab 11/01/20 1156 11/02/20 1057  WBC 9.5 7.1  NEUTROABS 5.7 2.9  HGB 11.4* 10.7*  HCT 35.2* 31.4*  MCV 96.7 95.2  PLT 546* 571*    Basic Metabolic Panel: Recent Labs  Lab 10/31/20 1112  NA 134*  K 4.1  CL 100  CO2 26  GLUCOSE 271*  BUN 19  CREATININE 0.93  CALCIUM 8.8*    GFR: Estimated Creatinine Clearance: 92.9 mL/min (by C-G formula based  on SCr of 0.93 mg/dL). Liver Function Tests: Recent Labs  Lab 10/31/20 1112  AST 28  ALT 42  ALKPHOS 84  BILITOT 0.3  PROT 6.1*  ALBUMIN 1.9*    No results for input(s): LIPASE, AMYLASE in the last 168 hours. Recent Labs  Lab 10/31/20 1112  AMMONIA 25    Coagulation Profile: No results for input(s): INR, PROTIME in the last 168 hours.  Cardiac Enzymes: No results for input(s): CKTOTAL, CKMB, CKMBINDEX, TROPONINI in the last 168 hours. BNP (last 3 results) No results for input(s): PROBNP in the last 8760 hours. HbA1C: No results for input(s): HGBA1C in the last 72 hours. CBG: Recent Labs  Lab 11/06/20 1604 11/06/20 1945 11/07/20 0010 11/07/20 0426 11/07/20 0737  GLUCAP 120* 154* 143* 186* 168*    Lipid Profile: No results for input(s): CHOL, HDL, LDLCALC,  TRIG, CHOLHDL, LDLDIRECT in the last 72 hours. Thyroid Function Tests: No results for input(s): TSH, T4TOTAL, FREET4, T3FREE, THYROIDAB in the last 72 hours. Anemia Panel: No results for input(s): VITAMINB12, FOLATE, FERRITIN, TIBC, IRON, RETICCTPCT in the last 72 hours. Sepsis Labs: Recent Labs  Lab 11/02/20 1057  PROCALCITON <0.10     No results found for this or any previous visit (from the past 240 hour(s)).         Radiology Studies: No results found.   Scheduled Meds:  buPROPion  100 mg Per Tube BID   [START ON 11/09/2020] enoxaparin (LOVENOX) injection  40 mg Subcutaneous Daily   famotidine  20 mg Per Tube BID   FLUoxetine  40 mg Per Tube Daily   free water  200 mL Per Tube Q4H   insulin aspart  0-15 Units Subcutaneous Q4H   insulin glargine-yfgn  40 Units Subcutaneous BID   multivitamin with minerals  1 tablet Per Tube Daily   OLANZapine  2.5 mg Oral Daily   And   OLANZapine  5 mg Oral QHS   polyethylene glycol  17 g Per Tube Daily   rosuvastatin  20 mg Per Tube QHS   senna-docusate  1 tablet Per Tube BID   valproic acid  500 mg Per Tube TID   Continuous Infusions:  feeding supplement (GLUCERNA 1.5 CAL) 1,000 mL (11/07/20 0808)     LOS: 19 days   Time spent: 25 minutes.   Hughie Closs, MD [facility]  If 7PM-7AM, please contact night-coverage [email or web address]  11/07/2020, 10:50 AM

## 2020-11-07 NOTE — Consult Note (Signed)
[facility] Face-to-Face Psychiatry Consult   Reason for Consult:  Suicidal statements Referring Physician:  [name], MD.  Patient Identification: Edward Garner MRN:  [id number] Principal Diagnosis: <principal problem not specified> Diagnosis:  Active Problems:   Hypoxemia   Acute respiratory failure with hypoxia (HCC)   Malnutrition of moderate degree   Delirium due to another medical condition   Polypharmacy   Dysphagia   Aspiration pneumonia (HCC)   Encounter for feeding tube placement  Assessment  Edward Garner is a 56 y.o. male admitted medically for 10/19/2020 12:55 PM for aspiration pneumonia after a spinal procedure. He carries the psychiatric diagnoses of schizoaffective disorder vs bipolar disorder (per mom), ADHD and anxiety and has a past medical history of  adrenal disease, bipolar disorder, diabetes, hyperlipidemia, sleep apnea and spinal stenosis. Psychiatry was consulted for suicidal statments by [name], MD.      He does carry the diagnosis of bipolar disorder vs schizoaffective disorder , and has a history of pill seeking behavior. Current outpatient psychotropic medications include alprazolam, adderall, buproprion, depakote, prozac, prazosin, vyvanse, zolpidem and historically he has had a poor response to these medications; family has significant concern for overall decompensation over past 2-3 years and overmedication/pill-seeking behavior.    He was compliant with medications prior to admission. On initial examination, patient was unable to engage in a nuanced discussion of medications due to his delirium. Overall we are working towards minimizing deliriogenic meds. Please see plan below for detailed recommendations.    10/11: pt became hyperfocused on short acting xanax not covering anxiety (gets 3 mg ER in AM at home). Not amenable to discussion that he is getting IR TID, eventually agreeable to transition to ativan at roughly the BZD equivalent  dose. Balancing respiratory suppression with anxiety difficult in this pt on chronic BZD, stimulant, Z drug and opioids.    10/12: pt seems more delirious, many nonsensical responses to questions. Called steroids "suicide pills" and mom confirmed these have caused psychiatric destabilization in past. Pt assented and mom consented to olanzapine while on steroids. Specifically discussed risk of worsening tardive dyskinesia; feel olanzapine better agent than depakote in setting of significant malnutrition and hypolabuminemia. Did not discuss BZD today - no intention to further taper until later this week, discussion significantly agitated pt yesterday, pt not currently able to have full r/b/se discussion.    10/13: marginally improved. Continues to make suicidal statements which seem provocative in nature and not in line with pt's overall reported mood - continues to have residual delirium and occasionally pulls at lines, NG tube, etc - will continue sitter for now   10/14: continues with delirium, able to joke some today. A little less oriented. Denied SI today.    10/18- Patient continues to deny SI.  Patient continues to have trouble with orientation today he was oriented to self and hospital but did not know the city or state.  Patient also had trouble with the month.  Patient reports that he will try to better communicate with nursing staff when he is in pain rather than telling them he plans to jump out of the window when he is having pain.  Patient endorsed that he does plan to continue living and has not had SI in a few days.  Patient does continue to present with some symptoms concerning for delirium as he continues to have a difficult time remaining oriented.  We will decrease Wellbutrin dosage as it is a pro dopaminergic medication and patient is  already taking Prozac.  The goal is to decrease Pro dopaminergic medications in an attempt to relieve symptoms concerning for delirium.   10/20: pt with  improved orientation and attention compared to prior evals. Will decrease lorazepam to 1 TID. Will trial IV thiamine (prolonged AMS/poor nutrition). Understand he may get PEG tube in next few days.   10/25: Patient appears significantly improved today.  Patient did very well on Mini-Mental status exam appears alert and oriented x4.  MMSE- Able to name days of the week backwards,42-27 backwards,  able to do months of the year backwards. Patient endorses improvement in mood and denies any SI or thoughts of wanting to "jump out the window."  Patient mother was in the room and also endorses that patient appears significantly improved and she feels that his current medication regimen is appropriate.  Discussed with patient and patient's mother that once patient receives his PEG tube today we will attempt to titrate down on oral Ativan, upon review of EMR patient received a total of 1.5 mg of Ativan in the past 24 hours.  Also discussed with patient that there does not appear to be any to adjust Depakote dose due to continued low albumin.  We will continue Zyprexa as patient appears stable and well on current medication regimen.  May consider reintroducing medication for ADHD?, patient endorses that it really does help with his focus and is concerned that he may have trouble once he is required to have more focus and CIR. Discussed with patient and mother that patient delirium appears significantly improved but he is at risk for delirium again post his PEG procedure today  Plan  ## Safety and Observation Level:  - Based on my clinical evaluation, I estimate the patient to be at moderate  risk of self harm in the current setting from pulling at lines, tubes, etc - continue sitter (no longer recommending for SI, can be d/c if delirium improves)   ## Medications:  --Standing: CONTINUE:              - buproprion 100 mg BID             - valproic acid 500 mg TID             - fluoxetine 40 mg qD     -Start  Ativan 0.5 mg BID and 0.5mg  PRN, post PEG tube - start thiamine 200 mg qD     CONTINUE olanzapine 5 QHS and 2.5 daily Continue olanzapine 2.5 mg twice daily as needed, agitation   Continue to hold:              - zolpidem, home stimulants     We are generally making medication changes to reduce sedative burden in this patient (reducing xanax, stopping ambien).    ## Medical Decision Making Capacity:  Not specifically assessed   ## Further Work-up:  -- Recommend repeat CMP   ## Disposition:  --  no psychiatric contraindications to discharge at this time   ## Behavioral / Environmental:  -- Continue delirium precautions     Thank you for this consult request. Recommendations have been communicated to the primary team.   Total Time spent with patient: 20 minutes  Subjective:   Edward Garner is a 55 y.o. male patient admitted with aspiration pneumonia after a spinal procedure. He carries the psychiatric diagnoses of schizoaffective disorder vs bipolar disorder (per mom), ADHD and anxiety and has a past medical history of  adrenal disease,  bipolar disorder, diabetes, hyperlipidemia, sleep apnea and spinal stenosis.  HPI: Patient oriented to place (hospital, state, country, planet, and galaxy), month,  year, day of the week, person and situation.On assessment today patient reports his mood is "normal a little bit nervous because I have this procedure soon."  Patient is happy to report that he has been able to take some PO liquids. Patient reports that he also took a shower and combed his hair this morning. Patient denies thoughts of wanting to jump out of the window. Mom reports that the patient did endorse that when he was more delirious he was seeing "things" and he recalls this but he is no longer seeing things. Patient endorses that the things he was seeing were "silly."  No SI, HI, or AVH endorsed today.  Mom Mom reports that patient appears better and believes that the  medication adjustments have been beneficial. Mom endorses that she is very happy with his psychiatric treatment during this hospitalization.    Past Medical History:  Past Medical History:  Diagnosis Date   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HCC)    Bipolar disorder (HCC)    Complication of anesthesia    Diabetes mellitus without complication (HCC)    Hyperlipidemia    Hypertension    Kidney disease    PONV (postoperative nausea and vomiting)    Sleep apnea    Sleep apnea, unspecified 10/19/2018   Spinal stenosis     Past Surgical History:  Procedure Laterality Date   ANTERIOR CERVICAL DECOMP/DISCECTOMY FUSION N/A 10/16/2020   Procedure: ACDF - C3-C4 - C4-C5 - C5-C6;  Surgeon: Tia Alert, MD;  Location: [facility];  Service: Neurosurgery;  Laterality: N/A;   TONSILLECTOMY     Family History:  Family History  Problem Relation Age of Onset   Non-Hodgkin's lymphoma Mother    Diabetes Father    Cancer Father    Thyroid disease Father    Hypertension Father    Hyperlipidemia Father    Bipolar disorder Father    Social History:  Social History   Substance and Sexual Activity  Alcohol Use Not Currently     Social History   Substance and Sexual Activity  Drug Use Never    Social History   Socioeconomic History   Marital status: Single    Spouse name: Not on file   Number of children: Not on file   Years of education: Not on file   Highest education level: Not on file  Occupational History   Not on file  Tobacco Use   Smoking status: Former    Packs/day: 0.50    Years: 0.50    Pack years: 0.25    Types: Cigarettes    Quit date: 1996    Years since quitting: 26.8   Smokeless tobacco: Former    Types: Snuff  Vaping Use   Vaping Use: Never used  Substance and Sexual Activity   Alcohol use: Not Currently   Drug use: Never   Sexual activity: Not on file  Other Topics Concern   Not on file  Social History Narrative   Not on file   Social Determinants of Health   Financial  Resource Strain: Not on file  Food Insecurity: Not on file  Transportation Needs: Not on file  Physical Activity: Not on file  Stress: Not on file  Social Connections: Not on file   Additional Social History:    Allergies:   Allergies  Allergen Reactions   Gramineae Pollens  Labs:  Results for orders placed or performed during the hospital encounter of 10/19/20 (from the past 48 hour(s))  Glucose, capillary     Status: Abnormal   Collection Time: 11/05/20 12:04 PM  Result Value Ref Range   Glucose-Capillary 173 (H) 70 - 99 mg/dL    Comment: Glucose reference range applies only to samples taken after fasting for at least 8 hours.  Glucose, capillary     Status: Abnormal   Collection Time: 11/05/20  4:07 PM  Result Value Ref Range   Glucose-Capillary 130 (H) 70 - 99 mg/dL    Comment: Glucose reference range applies only to samples taken after fasting for at least 8 hours.  Glucose, capillary     Status: Abnormal   Collection Time: 11/05/20  9:43 PM  Result Value Ref Range   Glucose-Capillary 147 (H) 70 - 99 mg/dL    Comment: Glucose reference range applies only to samples taken after fasting for at least 8 hours.  Glucose, capillary     Status: Abnormal   Collection Time: 11/06/20 12:53 AM  Result Value Ref Range   Glucose-Capillary 140 (H) 70 - 99 mg/dL    Comment: Glucose reference range applies only to samples taken after fasting for at least 8 hours.  Glucose, capillary     Status: Abnormal   Collection Time: 11/06/20  4:53 AM  Result Value Ref Range   Glucose-Capillary 148 (H) 70 - 99 mg/dL    Comment: Glucose reference range applies only to samples taken after fasting for at least 8 hours.  Glucose, capillary     Status: Abnormal   Collection Time: 11/06/20  8:17 AM  Result Value Ref Range   Glucose-Capillary 197 (H) 70 - 99 mg/dL    Comment: Glucose reference range applies only to samples taken after fasting for at least 8 hours.  Glucose, capillary     Status:  Abnormal   Collection Time: 11/06/20 11:54 AM  Result Value Ref Range   Glucose-Capillary 123 (H) 70 - 99 mg/dL    Comment: Glucose reference range applies only to samples taken after fasting for at least 8 hours.  Glucose, capillary     Status: Abnormal   Collection Time: 11/06/20  4:04 PM  Result Value Ref Range   Glucose-Capillary 120 (H) 70 - 99 mg/dL    Comment: Glucose reference range applies only to samples taken after fasting for at least 8 hours.  Glucose, capillary     Status: Abnormal   Collection Time: 11/06/20  7:45 PM  Result Value Ref Range   Glucose-Capillary 154 (H) 70 - 99 mg/dL    Comment: Glucose reference range applies only to samples taken after fasting for at least 8 hours.  Glucose, capillary     Status: Abnormal   Collection Time: 11/07/20 12:10 AM  Result Value Ref Range   Glucose-Capillary 143 (H) 70 - 99 mg/dL    Comment: Glucose reference range applies only to samples taken after fasting for at least 8 hours.  Glucose, capillary     Status: Abnormal   Collection Time: 11/07/20  4:26 AM  Result Value Ref Range   Glucose-Capillary 186 (H) 70 - 99 mg/dL    Comment: Glucose reference range applies only to samples taken after fasting for at least 8 hours.  Glucose, capillary     Status: Abnormal   Collection Time: 11/07/20  7:37 AM  Result Value Ref Range   Glucose-Capillary 168 (H) 70 - 99 mg/dL  Comment: Glucose reference range applies only to samples taken after fasting for at least 8 hours.    Current Facility-Administered Medications  Medication Dose Route Frequency Provider Last Rate Last Admin   buPROPion [facility]) tablet 100 mg  100 mg Per Tube BID Eliseo Gum B, MD   100 mg at 11/07/20 0819   docusate (COLACE) 50 MG/5ML liquid 100 mg  100 mg Per Tube BID PRN [name], MD       Melene Muller ON 11/09/2020] enoxaparin (LOVENOX) injection 40 mg  40 mg Subcutaneous Daily Ralene Muskrat, PA-C       famotidine (PEPCID) tablet 20 mg  20 mg Per  Tube BID [name], MD   20 mg at 11/07/20 9528   feeding supplement (GLUCERNA 1.5 CAL) liquid 1,000 mL  1,000 mL Per Tube Continuous Hughie Closs, MD 65 mL/hr at 11/07/20 0808 1,000 mL at 11/07/20 0808   FLUoxetine (PROZAC) 20 MG/5ML solution 40 mg  40 mg Per Tube Daily [name], MD   40 mg at 11/07/20 0809   free water 200 mL  200 mL Per Tube Q4H Regalado, Belkys A, MD   200 mL at 11/07/20 0813   hydrALAZINE (APRESOLINE) injection 10 mg  10 mg Intravenous Q4H PRN Tobey Grim, NP       HYDROmorphone (DILAUDID) injection 0.5 mg  0.5 mg Intravenous Q4H PRN Regalado, Belkys A, MD   0.5 mg at 11/06/20 1722   insulin aspart (novoLOG) injection 0-15 Units  0-15 Units Subcutaneous Q4H Tobey Grim, NP   3 Units at 11/07/20 0434   insulin glargine-yfgn (SEMGLEE) injection 40 Units  40 Units Subcutaneous BID Hughie Closs, MD   40 Units at 11/07/20 0813   ipratropium-albuterol (DUONEB) 0.5-2.5 (3) MG/3ML nebulizer solution 3 mL  3 mL Nebulization Q6H PRN Mikey College T, MD       labetalol (NORMODYNE) injection 10 mg  10 mg Intravenous Q2H PRN Tobey Grim, NP       lip balm (CARMEX) ointment   Topical PRN Rica Mote, MD       LORazepam (ATIVAN) tablet 0.5 mg  0.5 mg Oral Q8H PRN Pham, Minh Q, RPH-CPP   0.5 mg at 11/06/20 2136   multivitamin with minerals tablet 1 tablet  1 tablet Per Tube Daily [name], MD   1 tablet at 11/07/20 0811   OLANZapine (ZYPREXA) tablet 2.5 mg  2.5 mg Per Tube BID PRN Cinderella, Margaret A   2.5 mg at 11/01/20 2350   OLANZapine (ZYPREXA) tablet 2.5 mg  2.5 mg Oral Daily Maryagnes Amos, FNP   2.5 mg at 11/07/20 4132   And   OLANZapine (ZYPREXA) tablet 5 mg  5 mg Oral QHS Maryagnes Amos, FNP   5 mg at 11/06/20 2135   ondansetron (ZOFRAN) injection 4 mg  4 mg Intravenous Q6H PRN Mikey College T, MD       oxyCODONE-acetaminophen (PERCOCET/ROXICET) 5-325 MG per tablet 1-2 tablet  1-2 tablet Per Tube Q6H PRN  Regalado, Belkys A, MD   2 tablet at 11/06/20 2135   polyethylene glycol (MIRALAX / GLYCOLAX) packet 17 g  17 g Per Tube Daily PRN [name], MD   17 g at 10/25/20 0950   polyethylene glycol (MIRALAX / GLYCOLAX) packet 17 g  17 g Per Tube Daily Regalado, Belkys A, MD   17 g at 11/07/20 0810   rosuvastatin (CRESTOR) tablet 20 mg  20 mg Per Tube  Lizabeth Leyden, MD   20 mg at 11/06/20 2136   senna-docusate (Senokot-S) tablet 1 tablet  1 tablet Per Tube BID Regalado, Belkys A, MD   1 tablet at 11/07/20 0811   valproic acid (DEPAKENE) 250 MG/5ML solution 500 mg  500 mg Per Tube TID [name], MD   500 mg at 11/07/20 0809    MSK  No writhing movements around mouth. No medication induced tremor noted on exam, mom noted to have similar mild resting tremor that is seen in patient on assessment today          Psychiatric Specialty Exam:  Presentation  General Appearance: Appropriate for Environment  Eye Contact:Fair  Speech:-- (more clear than previous)  Speech Volume:Normal  Handedness:Right   Mood and Affect  Mood:-- (good (per mom, has been frustrated with slow progress))  Affect:Constricted   Thought Process  Thought Processes:Coherent  Descriptions of Associations:Circumstantial (significantly less tangential, flight of ideas, etc than previous)  Orientation:Partial  Thought Content:-- (perseverates on controlled substances)  History of Schizophrenia/Schizoaffective disorder:No data recorded Duration of Psychotic Symptoms:No data recorded Hallucinations:No data recorded Ideas of Reference:Delusions  Suicidal Thoughts:No data recorded Homicidal Thoughts:No data recorded  Sensorium  Memory:Immediate Fair; Recent Fair  Judgment:Poor  Insight:Shallow   Executive Functions  Concentration:Fair  Attention Span:Fair  Recall:Poor  Fund of Knowledge:Fair  Language:Fair   Psychomotor Activity  Psychomotor Activity:No data  recorded  Assets  Assets:Social Support   Sleep  Sleep:No data recorded  Physical Exam: Physical Exam Pulmonary:     Effort: Pulmonary effort is normal.  Neurological:     Mental Status: He is alert and oriented to person, place, and time.   Review of Systems  Psychiatric/Behavioral:  Negative for hallucinations and suicidal ideas.   Blood pressure 95/69, pulse 88, temperature 98.1 F (36.7 C), temperature source Axillary, resp. rate 17, height 6' (1.829 m), weight 71.5 kg, SpO2 94 %. Body mass index is 21.38 kg/m.  PGY-2 Bobbye Morton, MD 11/07/2020 11:13 AM

## 2020-11-07 NOTE — Progress Notes (Signed)
Occupational Therapy Treatment Patient Details Name: Edward Garner MRN: [id number] DOB: [birth year] Today's Date: 11/07/2020   History of present illness Pt is a 55 y.o. male admitted 10/19/20 with AMS, lethargy, hypoxia; workup for multifocal PNA. Head CT negative for acute abnormality. Of note, recent admission 10/3-10/4/22 for C3-6 ACDF; brief ER visit 10/4 for syncope. Other PMH includes DM, HTN, HLD, OSA, seizure, schizoaffective disorder.   OT comments  Patient supine in bed and agreeable to OT session.  Patient engaged in mobility in room with min assist using RW, cueing for safety and pacing.  Completed transfers with min guard using RW.  Engaged in 3 step ADL trail making task with good recall of tasks, following mulitple step commands with supervision.  Min guard for LB dressing, grooming standing at sink.  Continue to recommend CIR at this time.  Will follow.    Recommendations for follow up therapy are one component of a multi-disciplinary discharge planning process, led by the attending physician.  Recommendations may be updated based on patient status, additional functional criteria and insurance authorization.    Follow Up Recommendations  Acute inpatient rehab (3hours/day)    Assistance Recommended at Discharge Frequent or constant Supervision/Assistance  Equipment Recommendations  Other (comment) (TBD)    Recommendations for Other Services      Precautions / Restrictions Precautions Precautions: Fall;Cervical Precaution Comments: monitor BP Required Braces or Orthoses: Cervical Brace Cervical Brace: Soft collar;For comfort Restrictions Weight Bearing Restrictions: No       Mobility Bed Mobility Overal bed mobility: Needs Assistance Bed Mobility: Rolling;Sidelying to Sit;Sit to Sidelying Rolling: Supervision Sidelying to sit: Supervision     Sit to sidelying: Supervision General bed mobility comments: for safety, cueing for technique     Transfers Overall transfer level: Needs assistance Equipment used: Rolling walker (2 wheels) Transfers: Sit to/from Stand Sit to Stand: Min guard           General transfer comment: for safety, cueing for hand placement     Balance Overall balance assessment: Needs assistance Sitting-balance support: Feet supported Sitting balance-Leahy Scale: Good     Standing balance support: No upper extremity supported;During functional activity;Bilateral upper extremity supported Standing balance-Leahy Scale: Poor Standing balance comment: relies on RW and external min guard support                           ADL either performed or assessed with clinical judgement   ADL Overall ADL's : Needs assistance/impaired Eating/Feeding: NPO   Grooming: Wash/dry hands;Brushing hair;Standing;Min guard Grooming Details (indicate cue type and reason): min guard for safety, cueing for problem solving             Lower Body Dressing: Min guard;Sit to/from stand Lower Body Dressing Details (indicate cue type and reason): able to don/doff socks, min guard sit to stand Toilet Transfer: Minimal assistance;Ambulation;Rolling walker (2 wheels) Toilet Transfer Details (indicate cue type and reason): min assist due to balance, safety with RW and problem solving         Functional mobility during ADLs: Minimal assistance;Rolling walker (2 wheels)       Vision       Perception     Praxis      Cognition Arousal/Alertness: Awake/alert Behavior During Therapy: Impulsive Overall Cognitive Status: Impaired/Different from baseline Area of Impairment: Safety/judgement;Problem solving;Following commands  Following Commands: Follows multi-step commands with increased time Safety/Judgement: Decreased awareness of safety;Decreased awareness of deficits   Problem Solving: Difficulty sequencing;Requires verbal cues General Comments: pt remains tangential,  follows mulitple step commands during session to complete 3 step ADL tasks.  Requires min cueing for safety and problem sovling          Exercises     Shoulder Instructions       General Comments VSS on rA, BP stable    Pertinent Vitals/ Pain       Pain Assessment: 0-10 Pain Score: 6  Pain Location: neck Pain Descriptors / Indicators: Discomfort Pain Intervention(s): Monitored during session;Limited activity within patient's tolerance;Repositioned;Patient requesting pain meds-RN notified  Home Living                                          Prior Functioning/Environment              Frequency  Min 2X/week        Progress Toward Goals  OT Goals(current goals can now be found in the care plan section)  Progress towards OT goals: Progressing toward goals  Acute Rehab OT Goals OT Goal Formulation: With patient Time For Goal Achievement: 11/21/20 Potential to Achieve Goals: Good  Plan Discharge plan remains appropriate;Frequency remains appropriate    Co-evaluation                 AM-PAC OT "6 Clicks" Daily Activity     Outcome Measure   Help from another person eating meals?: Total Help from another person taking care of personal grooming?: A Little Help from another person toileting, which includes using toliet, bedpan, or urinal?: A Little Help from another person bathing (including washing, rinsing, drying)?: A Little Help from another person to put on and taking off regular upper body clothing?: A Little Help from another person to put on and taking off regular lower body clothing?: A Little 6 Click Score: 16    End of Session Equipment Utilized During Treatment: Rolling walker (2 wheels);Gait belt;Cervical collar  OT Visit Diagnosis: Unsteadiness on feet (R26.81);Other abnormalities of gait and mobility (R26.89);Muscle weakness (generalized) (M62.81);Other symptoms and signs involving cognitive function   Activity Tolerance  Patient tolerated treatment well   Patient Left in bed;with call bell/phone within reach;with bed alarm set;with family/visitor present   Nurse Communication Mobility status        Time: 1030-1048 OT Time Calculation (min): 18 min  Charges: OT General Charges $OT Visit: 1 Visit OT Treatments $Self Care/Home Management : 8-22 mins  Barry Brunner, OT Acute Rehabilitation Services Pager [phone] Office [phone]   Chancy Milroy 11/07/2020, 10:56 AM

## 2020-11-08 ENCOUNTER — Inpatient Hospital Stay (HOSPITAL_COMMUNITY): Payer: Medicare Other

## 2020-11-08 DIAGNOSIS — E440 Moderate protein-calorie malnutrition: Secondary | ICD-10-CM | POA: Diagnosis not present

## 2020-11-08 DIAGNOSIS — J9601 Acute respiratory failure with hypoxia: Secondary | ICD-10-CM | POA: Diagnosis not present

## 2020-11-08 DIAGNOSIS — F05 Delirium due to known physiological condition: Secondary | ICD-10-CM | POA: Diagnosis not present

## 2020-11-08 HISTORY — PX: IR GASTROSTOMY TUBE MOD SED: IMG625

## 2020-11-08 LAB — GLUCOSE, CAPILLARY
Glucose-Capillary: 64 mg/dL — ABNORMAL LOW (ref 70–99)
Glucose-Capillary: 120 mg/dL — ABNORMAL HIGH (ref 70–99)
Glucose-Capillary: 176 mg/dL — ABNORMAL HIGH (ref 70–99)
Glucose-Capillary: 101 mg/dL — ABNORMAL HIGH (ref 70–99)
Glucose-Capillary: 115 mg/dL — ABNORMAL HIGH (ref 70–99)
Glucose-Capillary: 117 mg/dL — ABNORMAL HIGH (ref 70–99)
Glucose-Capillary: 152 mg/dL — ABNORMAL HIGH (ref 70–99)

## 2020-11-08 MED ORDER — CEFAZOLIN SODIUM-DEXTROSE 2-4 GM/100ML-% IV SOLN
INTRAVENOUS | Status: AC | PRN
  Administered 2020-11-08: 2 g via INTRAVENOUS

## 2020-11-08 MED ORDER — GLUCAGON HCL RDNA (DIAGNOSTIC) 1 MG IJ SOLR
INTRAMUSCULAR | Status: AC
  Filled 2020-11-08: qty 1

## 2020-11-08 MED ORDER — MIDAZOLAM HCL 2 MG/2ML IJ SOLN
INTRAMUSCULAR | Status: AC | PRN
  Administered 2020-11-08 (×2): .5 mg via INTRAVENOUS
  Administered 2020-11-08: 1 mg via INTRAVENOUS

## 2020-11-08 MED ORDER — HYDROCODONE-ACETAMINOPHEN 5-325 MG PO TABS: 1.0000 | ORAL_TABLET | ORAL | Status: DC | PRN

## 2020-11-08 MED ORDER — LIDOCAINE HCL 1 % IJ SOLN
INTRAMUSCULAR | Status: AC
  Filled 2020-11-08: qty 20

## 2020-11-08 MED ORDER — FENTANYL CITRATE (PF) 100 MCG/2ML IJ SOLN
INTRAMUSCULAR | Status: AC | PRN
  Administered 2020-11-08 (×3): 25 ug via INTRAVENOUS

## 2020-11-08 MED ORDER — GLUCAGON HCL (RDNA) 1 MG IJ SOLR
INTRAMUSCULAR | Status: AC | PRN
  Administered 2020-11-08: .5 mg via INTRAVENOUS

## 2020-11-08 MED ORDER — HYDROMORPHONE HCL 1 MG/ML IJ SOLN
1.0000 mg | INTRAMUSCULAR | Status: DC | PRN
  Administered 2020-11-08 – 2020-11-10 (×11): 1 mg via INTRAVENOUS
  Filled 2020-11-08 (×11): qty 1

## 2020-11-08 MED ORDER — ONDANSETRON HCL 4 MG/2ML IJ SOLN
4.0000 mg | INTRAMUSCULAR | Status: DC | PRN
  Administered 2020-11-08: 4 mg via INTRAVENOUS
  Filled 2020-11-08: qty 2

## 2020-11-08 MED ORDER — CEFAZOLIN SODIUM-DEXTROSE 2-4 GM/100ML-% IV SOLN
INTRAVENOUS | Status: AC
  Filled 2020-11-08: qty 100

## 2020-11-08 MED ORDER — INSULIN GLARGINE-YFGN 100 UNIT/ML ~~LOC~~ SOLN
20.0000 [IU] | Freq: Two times a day (BID) | SUBCUTANEOUS | Status: DC
  Administered 2020-11-09 – 2020-11-10 (×3): 20 [IU] via SUBCUTANEOUS
  Filled 2020-11-08 (×5): qty 0.2

## 2020-11-08 MED ORDER — DEXTROSE 50 % IV SOLN
12.5000 g | Freq: Once | INTRAVENOUS | Status: AC
  Administered 2020-11-08: 12.5 g via INTRAVENOUS
  Filled 2020-11-08: qty 50

## 2020-11-08 MED ORDER — LORAZEPAM 2 MG/ML IJ SOLN
0.5000 mg | Freq: Two times a day (BID) | INTRAMUSCULAR | Status: DC
  Administered 2020-11-08 – 2020-11-10 (×4): 0.5 mg via INTRAVENOUS
  Filled 2020-11-08 (×4): qty 1

## 2020-11-08 MED ORDER — MIDAZOLAM HCL 2 MG/2ML IJ SOLN
INTRAMUSCULAR | Status: AC
  Filled 2020-11-08: qty 2

## 2020-11-08 MED ORDER — IOHEXOL 300 MG/ML  SOLN
100.0000 mL | Freq: Once | INTRAMUSCULAR | Status: AC | PRN
  Administered 2020-11-08: 10 mL

## 2020-11-08 MED ORDER — FENTANYL CITRATE (PF) 100 MCG/2ML IJ SOLN
INTRAMUSCULAR | Status: AC
  Filled 2020-11-08: qty 2

## 2020-11-08 MED ORDER — LIDOCAINE HCL (PF) 1 % IJ SOLN
INTRAMUSCULAR | Status: AC | PRN
  Administered 2020-11-08: 5 mL

## 2020-11-08 MED ORDER — LORAZEPAM 2 MG/ML IJ SOLN
0.5000 mg | Freq: Two times a day (BID) | INTRAMUSCULAR | Status: DC | PRN
  Administered 2020-11-08: 0.5 mg via INTRAVENOUS
  Filled 2020-11-08: qty 1

## 2020-11-08 NOTE — Progress Notes (Signed)
PROGRESS NOTE    Edward Garner  PNT:[id number] DOB: 1965-05-08 DOA: 10/19/2020 PCP: Edward James, MD   Brief Narrative: Patient is a 55 year old Caucasian male with past medical history significant for cigarette use, OSA, hypertension, hyperlipidemia, diabetes type 2, seizure and cervical spondylosis/stenosis status post ACDF on 10/16/2020.  Patient was admitted from home with confusion.  Patient recently underwent cervical ACDF C3 C6 by Dr. [name] on 10/16/2020 and discharged home on 10/17/2020.  Patient presented to the ER later same day after a syncopal episode at home.  CT head and cervical spine was negative, patient was discharged back home.  Patient later presented with progressive lethargy, hypoxic respiratory failure with O2 sat in the 70s that responded to Narcan, poor oral intake due to ongoing pain and some difficulty with swallowing.  On presentation, CTA was negative for PE.  CT cervical which showed slightly increased prevertebral swelling compared to 10/17/2020.  Patient was also found to have multifocal pneumonia.,  Significant Hospital events: 10/3 cervical ACDF C3-C6 with Dr. [name], discharged home 10/4 10/4 syncope at home, ER eval neg, discharged home 10/6 Admit to PCCM for acute hypoxic respiratory failure 2/2 multifocal pna, on BiPAP 10/7 On 11L [place]  11/05/2020: Patient seen alongside patient's mother and stepfather.  Patient is awaiting PEG tube placement.  The interventional radiology team has been consulted.  CT scan of the abdomen and pelvis done prior to the PEG placement by the interventional radiology team seems to have revealed enlarged left liver lobe with the presence of small intestine in the way of PEG tube placement.  There will be need to discuss feasibility of PEG tube placement by the interventional radiology team.  If interventional radiology team is not able to place the PEG tube, there may be need to consult the surgical team i.e. if the patient's swallowing  does not continue to improve.  Patient underwent repeat speech evaluation today.  Nectar thick liquid has been recommended.  I suspect that patient swallowing may continue to improve.  The plan is for patient to be discharged to CIR after PEG tube placement.    Assessment & Plan:   Active Problems:   Hypoxemia   Acute respiratory failure with hypoxia (HCC)   Malnutrition of moderate degree   Delirium due to another medical condition   Polypharmacy   Dysphagia   Aspiration pneumonia (HCC)   Encounter for feeding tube placement  1-Acute Hypoxic respiratory failure in the setting of multifocal pneumonia: -Concern for aspiration event with prevertebral swelling status post C-spine fusion -History of OSA on CPAP -Patient completed 7 days of Unasyn on 10/26/2020. -Patient has been off of supplemental oxygen since 10/31/2020.  2-Dysphagia: -CT with slightly increased prevertebral swelling compared to 10/17/2020 -Continue with core track and tube feeding. Speech following.  IR consulted for PEG tube placement.  He underwent PEG tube placement on 11/08/2020.  Remains NPO.  Hypernatremia:; Resolved.  Diabetes type 2: Hypoglycemic episode this morning, due to being NPO.  We will reduce his MED from 40 units twice daily to 20 units twice daily, holding this morning dose.  Will resume tonight.  Seizure disorder: Continue with Depakote.   Bipolar:  -Psychiatric team is directing care. -Patient is currently on Wellbutrin, Prozac, olanzapine and Depakote. -IV Ativan 0.5 mg twice daily scheduled and 0.5 mg twice daily as needed for anxiety. -Psychiatry input is highly appreciated. -No symptoms of bipolar disorder noted today, has improved significantly.  Hypomagnesemia: -Resolved.  Hypertension: -Labile and mostly low but he  is asymptomatic. -Cozaar was discontinued on 10/27/2020 due to low blood pressure. -Metoprolol was also discontinued early morning of 10/31/2020 due to  hypotension. -Minimize mind altering medications.  OSA: -Patient uses CPAP at night.  But refuses intermittently.  Patient seen and examined.  He is alert and oriented.  Mother at the bedside.  He has no complaints.  Intermittent suicidal statements:  As per prior documentation: "Patient continues to make suicidal statements intermittently.  He is being seen by psychiatry very closely.  Per psychiatry, his listed meds are not serious as he denies any intent or serious plan.  Psychiatry recommended sitter just for his delirium and not for suicidal prevention.  IVC was also removed several days ago".  Recent C-spine fusion by Dr. [name] 10/4: Neurosurgery following   Nutrition Problem: Moderate Malnutrition Etiology: chronic illness    Signs/Symptoms: moderate fat depletion, moderate muscle depletion    Interventions: MVI, Tube feeding  Estimated body mass index is 21.5 kg/m as calculated from the following:   Height as of this encounter: 6' (1.829 m).   Weight as of this encounter: 71.9 kg.   DVT prophylaxis: Lovenox Code Status: Full code Family Communication: Mother at bedside Disposition Plan:  Status is: Inpatient  Remains inpatient appropriate because: Meets inpatient criteria.  Dispo: The patient is from: Home              Anticipated d/c is to: Home              Patient currently is medically stable to d/c.   Difficult to place patient No   Consultants:  Neurosurgery   Procedures:    Antimicrobials:    Subjective:  Patient seen and examined.  He has no complaints.   Objective: Vitals:   11/08/20 0820 11/08/20 0835 11/08/20 0840 11/08/20 0900  BP: (!) 122/91 (!) 152/90 (!) 154/84 111/80  Pulse: 99 91 96 84  Resp: (!) 21 15 12 14   Temp:      TempSrc:      SpO2: 99% 97% 97% 95%  Weight:      Height:        Intake/Output Summary (Last 24 hours) at 11/08/2020 1055 Last data filed at 11/08/2020 0500 Gross per 24 hour  Intake --  Output 701 ml   Net -701 ml    Filed Weights   11/06/20 0500 11/07/20 0440 11/08/20 0336  Weight: 70.1 kg 71.5 kg 71.9 kg    Examination:  General exam: Appears calm and comfortable  Respiratory system: Clear to auscultation. Respiratory effort normal. Cardiovascular system: S1 & S2 heard, RRR. No JVD, murmurs, rubs, gallops or clicks. No pedal edema. Gastrointestinal system: Abdomen is nondistended, soft and nontender, PEG tube in place.. No organomegaly or masses felt. Normal bowel sounds heard. Central nervous system: Alert and oriented. No focal neurological deficits. Extremities: Symmetric 5 x 5 power. Skin: No rashes, lesions or ulcers.  Psychiatry: Judgement and insight appear poor  Data Reviewed: I have personally reviewed following labs and imaging studies  CBC: Recent Labs  Lab 11/01/20 1156 11/02/20 1057  WBC 9.5 7.1  NEUTROABS 5.7 2.9  HGB 11.4* 10.7*  HCT 35.2* 31.4*  MCV 96.7 95.2  PLT 546* 571*    Basic Metabolic Panel: No results for input(s): NA, K, CL, CO2, GLUCOSE, BUN, CREATININE, CALCIUM, MG, PHOS in the last 168 hours.  GFR: Estimated Creatinine Clearance: 93.4 mL/min (by C-G formula based on SCr of 0.93 mg/dL). Liver Function Tests: No results for input(s): AST, ALT,  ALKPHOS, BILITOT, PROT, ALBUMIN in the last 168 hours.  No results for input(s): LIPASE, AMYLASE in the last 168 hours. No results for input(s): AMMONIA in the last 168 hours.  Coagulation Profile: No results for input(s): INR, PROTIME in the last 168 hours.  Cardiac Enzymes: No results for input(s): CKTOTAL, CKMB, CKMBINDEX, TROPONINI in the last 168 hours. BNP (last 3 results) No results for input(s): PROBNP in the last 8760 hours. HbA1C: No results for input(s): HGBA1C in the last 72 hours. CBG: Recent Labs  Lab 11/07/20 1643 11/07/20 1933 11/07/20 2309 11/08/20 0329 11/08/20 0455  GLUCAP 102* 108* 102* 64* 120*    Lipid Profile: No results for input(s): CHOL, HDL, LDLCALC,  TRIG, CHOLHDL, LDLDIRECT in the last 72 hours. Thyroid Function Tests: No results for input(s): TSH, T4TOTAL, FREET4, T3FREE, THYROIDAB in the last 72 hours. Anemia Panel: No results for input(s): VITAMINB12, FOLATE, FERRITIN, TIBC, IRON, RETICCTPCT in the last 72 hours. Sepsis Labs: Recent Labs  Lab 11/02/20 1057  PROCALCITON <0.10     No results found for this or any previous visit (from the past 240 hour(s)).         Radiology Studies: No results found.   Scheduled Meds:  buPROPion  100 mg Per Tube BID   [START ON 11/09/2020] enoxaparin (LOVENOX) injection  40 mg Subcutaneous Daily   famotidine  20 mg Per Tube BID   FLUoxetine  40 mg Per Tube Daily   free water  200 mL Per Tube Q4H   insulin aspart  0-15 Units Subcutaneous Q4H   insulin glargine-yfgn  20 Units Subcutaneous BID   lidocaine       LORazepam  0.5 mg Oral BID   multivitamin with minerals  1 tablet Per Tube Daily   OLANZapine  2.5 mg Oral Daily   And   OLANZapine  5 mg Oral QHS   polyethylene glycol  17 g Per Tube Daily   rosuvastatin  20 mg Per Tube QHS   senna-docusate  1 tablet Per Tube BID   valproic acid  500 mg Per Tube TID   Continuous Infusions:  ceFAZolin     feeding supplement (GLUCERNA 1.5 CAL) 1,000 mL (11/07/20 0808)     LOS: 20 days   Time spent: 26 minutes.   Hughie Closs, MD [facility]  If 7PM-7AM, please contact night-coverage [email or web address]  11/08/2020, 10:55 AM

## 2020-11-08 NOTE — Progress Notes (Signed)
Inpatient Rehabilitation Admissions Coordinator   Noted received PEG today. I await tube feeding tolerance before proceeding with possible Cir admit.  Ottie Glazier, RN, MSN Rehab Admissions Coordinator [phone] 11/08/2020 1:04 PM

## 2020-11-08 NOTE — Progress Notes (Signed)
Physical Therapy Treatment Patient Details Name: Edward Garner MRN: [id number] DOB: 1965/01/26 Today's Date: 11/08/2020   History of Present Illness Pt is a 55 y.o. male admitted 10/19/20 with AMS, lethargy, hypoxia; workup for multifocal PNA. Head CT negative for acute abnormality. Of note, recent admission 10/3-10/4/22 for C3-6 ACDF; brief ER visit 10/4 for syncope. Other PMH includes DM, HTN, HLD, OSA, seizure, schizoaffective disorder.  Had PEG placed 11/08/20 due to ongoing dysphagia.    PT Comments    Patient with decreased mobility this session due to PEG placed this am.  Needing supplemental O2 as well throughout session dropping to 87% prior to mobility on RA.  Patient endorsing dizziness this session as well, but BP stable, states had Ativan and pain meds recently.  Still with poor safety and deficit awareness  wanting to eat despite known dysphagia.  Patient will benefit from inpatient rehab prior to d/c home.  PT to follow acutely.   Recommendations for follow up therapy are one component of a multi-disciplinary discharge planning process, led by the attending physician.  Recommendations may be updated based on patient status, additional functional criteria and insurance authorization.  Follow Up Recommendations  Acute inpatient rehab (3hours/day)     Assistance Recommended at Discharge Frequent or constant Supervision/Assistance  Equipment Recommendations  Rolling walker (2 wheels)    Recommendations for Other Services       Precautions / Restrictions Precautions Precautions: Fall Precaution Comments: monitor BP; PEG Required Braces or Orthoses: Cervical Brace Cervical Brace: Soft collar;For comfort     Mobility  Bed Mobility Overal bed mobility: Needs Assistance Bed Mobility: Rolling;Sidelying to Sit;Sit to Sidelying Rolling: Supervision Sidelying to sit: Min guard     Sit to sidelying: Supervision General bed mobility comments: for safety, cueing for  technique; assist for lines and due to PEG placed this am; returned to supine with uncontrolled descent and educated need for sidelying due to PEG and spinal surgery    Transfers Overall transfer level: Needs assistance Equipment used: Rolling walker (2 wheels) Transfers: Sit to/from Stand Sit to Stand: Min assist;Mod assist           General transfer comment: assist for lifting from bed at lowest position    Ambulation/Gait Ambulation/Gait assistance: Min assist Gait Distance (Feet): 70 Feet Assistive device: Rolling walker (2 wheels) Gait Pattern/deviations: Step-through pattern;Decreased stride length;Narrow base of support     General Gait Details: slow pace, assist for balance and safety with O2 needed during mobility dropping to 80's on RA prior to attempted ambulation   Stairs             Wheelchair Mobility    Modified Rankin (Stroke Patients Only)       Balance Overall balance assessment: Needs assistance   Sitting balance-Leahy Scale: Fair Sitting balance - Comments: sat EOB several minutes while PT obtained O2 and extra gown, no LOB, no UE support, but no dynamic balance activities due to recent PEG tube placement   Standing balance support: Bilateral upper extremity supported Standing balance-Leahy Scale: Poor Standing balance comment: relies on RW and external min guard support                            Cognition Arousal/Alertness: Awake/alert Behavior During Therapy: Impulsive Overall Cognitive Status: Impaired/Different from baseline Area of Impairment: Safety/judgement;Problem solving;Following commands                   Current Attention  Level: Selective   Following Commands: Follows multi-step commands with increased time Safety/Judgement: Decreased awareness of safety;Decreased awareness of deficits   Problem Solving: Difficulty sequencing;Requires verbal cues          Exercises      General Comments  General comments (skin integrity, edema, etc.): on 2L O2 SpO2 89-90% with mobility, HR stable, BP 97/65 after ambulation      Pertinent Vitals/Pain Faces Pain Scale: Hurts even more Pain Location: abdomen with mobility following PEG this am Pain Descriptors / Indicators: Grimacing;Guarding;Sore Pain Intervention(s): Limited activity within patient's tolerance;Repositioned    Home Living                          Prior Function            PT Goals (current goals can now be found in the care plan section) Progress towards PT goals: Progressing toward goals    Frequency    Min 3X/week      PT Plan Current plan remains appropriate    Co-evaluation              AM-PAC PT "6 Clicks" Mobility   Outcome Measure  Help needed turning from your back to your side while in a flat bed without using bedrails?: A Little Help needed moving from lying on your back to sitting on the side of a flat bed without using bedrails?: A Little Help needed moving to and from a bed to a chair (including a wheelchair)?: A Little Help needed standing up from a chair using your arms (e.g., wheelchair or bedside chair)?: A Little Help needed to walk in hospital room?: A Little Help needed climbing 3-5 steps with a railing? : A Lot 6 Click Score: 17    End of Session Equipment Utilized During Treatment: Gait belt Activity Tolerance: Patient tolerated treatment well Patient left: with call bell/phone within reach;in bed;with family/visitor present   PT Visit Diagnosis: Other abnormalities of gait and mobility (R26.89);History of falling (Z91.81);Muscle weakness (generalized) (M62.81);Other symptoms and signs involving the nervous system (R29.898)     Time: 7858-8502 PT Time Calculation (min) (ACUTE ONLY): 28 min  Charges:  $Gait Training: 8-22 mins $Therapeutic Activity: 8-22 mins                     Sheran Lawless, PT Acute Rehabilitation  Services Pager:[phone] Office:[phone] 11/08/2020    Edward Garner 11/08/2020, 4:53 PM

## 2020-11-08 NOTE — Procedures (Signed)
  Procedure: Perc gastrostomy tube placement 61f EBL:   minimal Complications:  none immediate  See full dictation in [employer].  Thora Lance MD Main # [phone] Pager  [phone] Mobile [phone]

## 2020-11-08 NOTE — Progress Notes (Signed)
Inpatient Diabetes Program Recommendations  AACE/ADA: New Consensus Statement on Inpatient Glycemic Control (2015)  Target Ranges:  Prepandial:   less than 140 mg/dL      Peak postprandial:   less than 180 mg/dL (1-2 hours)      Critically ill patients:  140 - 180 mg/dL   Lab Results  Component Value Date   GLUCAP 120 (H) 11/08/2020   HGBA1C 8.0 (H) 10/16/2020    Review of Glycemic Control Results for Edward Garner, Edward Garner (MRN [id number]) as of 11/08/2020 10:48  Ref. Range 11/07/2020 07:37 11/07/2020 12:04 11/07/2020 16:43 11/07/2020 19:33 11/07/2020 23:09 11/08/2020 03:29 11/08/2020 04:55 11/08/2020 08:50  Glucose-Capillary Latest Ref Range: 70 - 99 mg/dL 536 (H) 90 144 (H) 315 (H) 102 (H) 64 (L) 120 (H)    Diabetes history: DM 2 Outpatient Diabetes medications:  Glucotrol 10 mg with breakfast, Jardiance 10 mg daily, Metformin 2000 mg daily, Trulicity 3 mg once a week Current orders for Inpatient glycemic control:  Novolog moderate q 4 hours Glucerna 1.5 cal- 65 ml/hr Semglee 40 units bid  Inpatient Diabetes Program Recommendations:   Consider reducing Semglee to 25 units bid- may need to add hold orders as well for low blood sugars.  It does not look like patient received PM dose of Semglee on 11/07/20.   Thanks,  Beryl Meager, RN, BC-ADM Inpatient Diabetes Coordinator Pager [phone]  (8a-5p)

## 2020-11-09 DIAGNOSIS — J9601 Acute respiratory failure with hypoxia: Secondary | ICD-10-CM | POA: Diagnosis not present

## 2020-11-09 DIAGNOSIS — F05 Delirium due to known physiological condition: Secondary | ICD-10-CM | POA: Diagnosis not present

## 2020-11-09 DIAGNOSIS — Z4659 Encounter for fitting and adjustment of other gastrointestinal appliance and device: Secondary | ICD-10-CM | POA: Diagnosis not present

## 2020-11-09 LAB — GLUCOSE, CAPILLARY
Glucose-Capillary: 108 mg/dL — ABNORMAL HIGH (ref 70–99)
Glucose-Capillary: 123 mg/dL — ABNORMAL HIGH (ref 70–99)
Glucose-Capillary: 147 mg/dL — ABNORMAL HIGH (ref 70–99)
Glucose-Capillary: 157 mg/dL — ABNORMAL HIGH (ref 70–99)
Glucose-Capillary: 175 mg/dL — ABNORMAL HIGH (ref 70–99)
Glucose-Capillary: 78 mg/dL (ref 70–99)
Glucose-Capillary: 92 mg/dL (ref 70–99)

## 2020-11-09 MED ORDER — TAMSULOSIN HCL 0.4 MG PO CAPS
0.4000 mg | ORAL_CAPSULE | Freq: Every day | ORAL | Status: DC
  Administered 2020-11-09 – 2020-11-10 (×2): 0.4 mg via ORAL
  Filled 2020-11-09 (×2): qty 1

## 2020-11-09 MED ORDER — PROSOURCE PLUS PO LIQD
30.0000 mL | Freq: Every day | ORAL | Status: DC
  Administered 2020-11-10: 30 mL via ORAL
  Filled 2020-11-09: qty 30

## 2020-11-09 MED ORDER — FREE WATER
185.0000 mL | Freq: Three times a day (TID) | Status: DC
  Administered 2020-11-10: 185 mL

## 2020-11-09 MED ORDER — GLUCERNA 1.2 CAL PO LIQD
474.0000 mL | Freq: Three times a day (TID) | ORAL | Status: DC
  Administered 2020-11-10 (×2): 474 mL
  Filled 2020-11-09 (×4): qty 474

## 2020-11-09 NOTE — Progress Notes (Signed)
PROGRESS NOTE    Edward Garner  VOJ:[id number] DOB: Jun 06, 1965 DOA: 10/19/2020 PCP: [name], MD   Brief Narrative: Patient is a 55 year old Caucasian male with past medical history significant for cigarette use, OSA, hypertension, hyperlipidemia, diabetes type 2, seizure and cervical spondylosis/stenosis status post ACDF on 10/16/2020.  Patient was admitted from home with confusion.  Patient recently underwent cervical ACDF C3 C6 by Dr. [name] on 10/16/2020 and discharged home on 10/17/2020.  Patient presented to the ER later same day after a syncopal episode at home.  CT head and cervical spine was negative, patient was discharged back home.  Patient later presented with progressive lethargy, hypoxic respiratory failure with O2 sat in the 70s that responded to Narcan, poor oral intake due to ongoing pain and some difficulty with swallowing.  On presentation, CTA was negative for PE.  CT cervical which showed slightly increased prevertebral swelling compared to 10/17/2020.  Patient was also found to have multifocal pneumonia.,  Significant Hospital events: 10/3 cervical ACDF C3-C6 with Dr. [name], discharged home 10/4 10/4 syncope at home, ER eval neg, discharged home 10/6 Admit to PCCM for acute hypoxic respiratory failure 2/2 multifocal pna, on BiPAP 10/7 On 11L [place]  11/05/2020: Patient seen alongside patient's mother and stepfather.  Patient is awaiting PEG tube placement.  The interventional radiology team has been consulted.  CT scan of the abdomen and pelvis done prior to the PEG placement by the interventional radiology team seems to have revealed enlarged left liver lobe with the presence of small intestine in the way of PEG tube placement.  There will be need to discuss feasibility of PEG tube placement by the interventional radiology team.  If interventional radiology team is not able to place the PEG tube, there may be need to consult the surgical team i.e. if the patient's swallowing  does not continue to improve.  Patient underwent repeat speech evaluation today.  Nectar thick liquid has been recommended.  I suspect that patient swallowing may continue to improve.  The plan is for patient to be discharged to CIR after PEG tube placement.    Assessment & Plan:   Active Problems:   Hypoxemia   Acute respiratory failure with hypoxia (HCC)   Malnutrition of moderate degree   Delirium due to another medical condition   Polypharmacy   Dysphagia   Aspiration pneumonia (HCC)   Encounter for feeding tube placement  1-Acute Hypoxic respiratory failure in the setting of multifocal pneumonia: -Concern for aspiration event with prevertebral swelling status post C-spine fusion -History of OSA on CPAP -Patient completed 7 days of Unasyn on 10/26/2020. -Patient has been off of supplemental oxygen since 10/31/2020.  2-Dysphagia: -CT with slightly increased prevertebral swelling compared to 10/17/2020 -Continue with core track and tube feeding. Speech following.  IR consulted for PEG tube placement.  He underwent PEG tube placement on 11/08/2020.  PEG tube feedings have been started today.  Hypernatremia:; Resolved.  Diabetes type 2: Blood sugar very well controlled.  Continue Semglee 20 units twice daily and SSI.  Seizure disorder: Continue with Depakote.   Bipolar:  -Psychiatric team is directing care. -Patient is currently on Wellbutrin, Prozac, olanzapine and Depakote. -IV Ativan 0.5 mg twice daily scheduled and 0.5 mg twice daily as needed for anxiety. -Psychiatry input is highly appreciated. -No symptoms of bipolar disorder noted today, has improved significantly.  Hypomagnesemia: -Resolved.  Hypertension: -Labile and mostly low but he is asymptomatic. -Cozaar was discontinued on 10/27/2020 due to low blood pressure. -Metoprolol  was also discontinued early morning of 10/31/2020 due to hypotension. -Minimize mind altering medications.  OSA: -Patient uses CPAP  at night.  But refuses intermittently.  Patient seen and examined.  He is alert and oriented.  Mother at the bedside.  He has no complaints.  Intermittent suicidal statements:  As per prior documentation: "Patient continues to make suicidal statements intermittently.  He is being seen by psychiatry very closely.  Per psychiatry, his listed meds are not serious as he denies any intent or serious plan.  Psychiatry recommended sitter just for his delirium and not for suicidal prevention.  IVC was also removed several days ago".  Recent C-spine fusion by Dr. [name] 10/4: Neurosurgery following   Nutrition Problem: Moderate Malnutrition Etiology: chronic illness    Signs/Symptoms: moderate fat depletion, moderate muscle depletion    Interventions: Prostat, Tube feeding  Estimated body mass index is 21.14 kg/m as calculated from the following:   Height as of this encounter: 6' (1.829 m).   Weight as of this encounter: 70.7 kg.   DVT prophylaxis: Lovenox Code Status: Full code Family Communication: Mother at bedside Disposition Plan:  Status is: Inpatient  Remains inpatient appropriate because: Meets inpatient criteria.  Dispo: The patient is from: Home              Anticipated d/c is to: Home              Patient currently is medically stable to d/c.   Difficult to place patient No   Consultants:  Neurosurgery   Procedures:    Antimicrobials:    Subjective: Patient seen and examined.  Mother at the bedside.  Patient is fully alert and oriented and much improved.  Has no complaints.   Objective: Vitals:   11/09/20 0013 11/09/20 0404 11/09/20 0800 11/09/20 1120  BP: (!) 96/56 (!) 108/54 (!) 120/94 115/72  Pulse:  93 89 73  Resp:   17 13  Temp: 97.8 F (36.6 C) 97.6 F (36.4 C) 97.9 F (36.6 C) 97.7 F (36.5 C)  TempSrc: Oral Oral Oral Oral  SpO2:   98% 96%  Weight:  70.7 kg    Height:        Intake/Output Summary (Last 24 hours) at 11/09/2020 1258 Last  data filed at 11/09/2020 0431 Gross per 24 hour  Intake 640 ml  Output 850 ml  Net -210 ml    Filed Weights   11/07/20 0440 11/08/20 0336 11/09/20 0404  Weight: 71.5 kg 71.9 kg 70.7 kg    Examination:  General exam: Appears calm and comfortable  Respiratory system: Clear to auscultation. Respiratory effort normal. Cardiovascular system: S1 & S2 heard, RRR. No JVD, murmurs, rubs, gallops or clicks. No pedal edema. Gastrointestinal system: Abdomen is nondistended, soft and nontender. No organomegaly or masses felt. Normal bowel sounds heard.  PEG tube in place. Central nervous system: Alert and oriented. No focal neurological deficits. Extremities: Symmetric 5 x 5 power. Skin: No rashes, lesions or ulcers.  Psychiatry: Judgement and insight appear poor  Data Reviewed: I have personally reviewed following labs and imaging studies  CBC: No results for input(s): WBC, NEUTROABS, HGB, HCT, MCV, PLT in the last 168 hours.  Basic Metabolic Panel: No results for input(s): NA, K, CL, CO2, GLUCOSE, BUN, CREATININE, CALCIUM, MG, PHOS in the last 168 hours.  GFR: Estimated Creatinine Clearance: 91.9 mL/min (by C-G formula based on SCr of 0.93 mg/dL). Liver Function Tests: No results for input(s): AST, ALT, ALKPHOS, BILITOT, PROT, ALBUMIN in  the last 168 hours.  No results for input(s): LIPASE, AMYLASE in the last 168 hours. No results for input(s): AMMONIA in the last 168 hours.  Coagulation Profile: No results for input(s): INR, PROTIME in the last 168 hours.  Cardiac Enzymes: No results for input(s): CKTOTAL, CKMB, CKMBINDEX, TROPONINI in the last 168 hours. BNP (last 3 results) No results for input(s): PROBNP in the last 8760 hours. HbA1C: No results for input(s): HGBA1C in the last 72 hours. CBG: Recent Labs  Lab 11/08/20 2328 11/09/20 0008 11/09/20 0409 11/09/20 0754 11/09/20 1122  GLUCAP 152* 147* 78 123* 92    Lipid Profile: No results for input(s): CHOL, HDL,  LDLCALC, TRIG, CHOLHDL, LDLDIRECT in the last 72 hours. Thyroid Function Tests: No results for input(s): TSH, T4TOTAL, FREET4, T3FREE, THYROIDAB in the last 72 hours. Anemia Panel: No results for input(s): VITAMINB12, FOLATE, FERRITIN, TIBC, IRON, RETICCTPCT in the last 72 hours. Sepsis Labs: No results for input(s): PROCALCITON, LATICACIDVEN in the last 168 hours.   No results found for this or any previous visit (from the past 240 hour(s)).         Radiology Studies: IR GASTROSTOMY TUBE MOD SED  Result Date: 11/08/2020 CLINICAL DATA:  Dysphagia, needs enteral feeding support EXAM: PERC PLACEMENT GASTROSTOMY FLUOROSCOPY TIME:  2 minute 42 seconds; 8 mGy TECHNIQUE: The procedure, risks, benefits, and alternatives were explained to the patient. Questions regarding the procedure were encouraged and answered. The patient understands and consents to the procedure. As antibiotic prophylaxis, cefazolin 2 g was ordered pre-procedure and administered intravenously within one hour of incision. A safe percutaneous approach was confirmed on recent CT. A 5 French angiographic catheter was placed as orogastric tube. The upper abdomen was prepped with Betadine, draped in usual sterile fashion, and infiltrated locally with 1% lidocaine. Intravenous Fentanyl and Versed 2mg  were administered as conscious sedation during continuous monitoring of the patient's level of consciousness and physiological / cardiorespiratory status by the radiology RN, with a total moderate sedation time of 11 minutes. 0.5 mg glucagon given IV.Stomach was insufflated using air through the orogastric tube. An 58 French sheath needle was advanced percutaneously into the gastric lumen under fluoroscopy. Gas could be aspirated and a small contrast injection confirmed intraluminal spread. The sheath was exchanged over a guidewire for a 9 15 vascular sheath, through which the snare device was advanced and used to snare a guidewire  passed through the orogastric tube. This was withdrawn, and the snare attached to the 20 French pull-through gastrostomy tube, which was advanced antegrade, positioned with the internal bumper securing the anterior gastric wall to the anterior abdominal wall. Small contrast injection confirms appropriate positioning. The external bumper was applied and the catheter was flushed. COMPLICATIONS: COMPLICATIONS none IMPRESSION: 1. Technically successful 20 French pull-through gastrostomy placement under fluoroscopy. Electronically Signed   By: [place] M.D.   On: 11/08/2020 12:38     Scheduled Meds:  [START ON 11/10/2020] (feeding supplement) PROSource Plus  30 mL Oral Daily   buPROPion  100 mg Per Tube BID   enoxaparin (LOVENOX) injection  40 mg Subcutaneous Daily   famotidine  20 mg Per Tube BID   [START ON 11/10/2020] feeding supplement (GLUCERNA 1.2 CAL)  474 mL Per Tube TID WC & HS   FLUoxetine  40 mg Per Tube Daily   [START ON 11/10/2020] free water  185 mL Per Tube Q8H   free water  200 mL Per Tube Q4H   insulin aspart  0-15 Units  Subcutaneous Q4H   insulin glargine-yfgn  20 Units Subcutaneous BID   LORazepam  0.5 mg Intravenous BID   multivitamin with minerals  1 tablet Per Tube Daily   OLANZapine  2.5 mg Oral Daily   And   OLANZapine  5 mg Oral QHS   polyethylene glycol  17 g Per Tube Daily   rosuvastatin  20 mg Per Tube QHS   senna-docusate  1 tablet Per Tube BID   valproic acid  500 mg Per Tube TID   Continuous Infusions:  feeding supplement (GLUCERNA 1.5 CAL) 1,000 mL (11/07/20 0808)     LOS: 21 days   Time spent: 25 minutes.   Hughie Closs, MD [facility]  If 7PM-7AM, please contact night-coverage [email or web address]  11/09/2020, 12:58 PM

## 2020-11-09 NOTE — Progress Notes (Signed)
Gastrostomy tube placed yesterday; site check today. Skin insertion site is clean, dry and without erythema or drainage. Tube easily flushes/aspirates. Ok to use tube for tube feedings and medications.   Please call IR with any questions.   Alwyn Ren, [place] [phone] 11/09/2020, 12:04 PM

## 2020-11-09 NOTE — Progress Notes (Addendum)
Nutrition Follow-up  DOCUMENTATION CODES:  Non-severe (moderate) malnutrition in context of chronic illness  INTERVENTION:  Continue TF via PEG: Glucerna 1.5 @ 42m/hr (15641md) Free water flushes 200 ml every 4 hours Provides 2340 kcal, 128 grams protein, 1184 ml free water (238426motal free water with flushes)  Beginning tomorrow, 10/27, transition to bolus feeding via PEG: -2 cartons (474m63mlucerna 1.2 QID, flush with 30ml34me water before and after each bolus (240ml 28m water from boluses) -45ml P88murce TF daily -Additional 185ml fr67mater TID Bolus TF regimen will provide 2320 kcals, 124 grams protein, 1536ml fre75mter (2331ml tota41mee water with flushes)   Will reach out to Diabetes Coordinator to let them know of change in TF regimen as pt more appropriate for meal coverage with bolus TF  NUTRITION DIAGNOSIS:  Moderate Malnutrition related to chronic illness as evidenced by moderate fat depletion, moderate muscle depletion. Ongoing  GOAL:  Patient will meet greater than or equal to 90% of their needs Met with TF  MONITOR:  TF tolerance, Weight trends, Skin  REASON FOR ASSESSMENT:  Consult Enteral/tube feeding initiation and management  ASSESSMENT:  55 y.o. ma74 presented to the ED with lethargy and O2 saturations in the 70's. Pt received a cervical ACDF C3-C6 on 10/3 and d/c on 10/4, pt returned to the ED after passing out and was d/c that day. Pt then had a televisit with PCP for pneumonia and was placed on an antibiotic. PMH of T2DM and HTN. Pt admitted with hypoxia respiratory failure due to pneumonia.  10/03 - cervical ACDF C3-C6  10/04 - discharged home; syncope at home w/ ER visit and later d/c  10/06 - admit to PCCM for acute hypoxic respiratory failure 2/2 multifocal PNA, on BiPAP 10/07 - Cortrak placed (tip gastric per xray), pt on 11L [place] 10/14 - repeat CXR shows persistent bilateral pulmonary infiltrates 10/26 - PEG placement  Pt continues to  be NPO and had been receiving TF via Cortrak. SLP following. Radiology met with pt/family to discuss placement of PEG and decision was made to proceed with placement. Pt underwent placement yesterday, 10/26. Will begin TF via PEG per MD orders. Discussed with RN. Plan to resume Current TF orders -- Glucerna 1.5 @ 65ml/hr wi64m00ml Q4H --21may and transition pt to bolus TF tomorrow in anticipation for pt's discharge to CIR. RN made aware of plan and without questions at this time.  UOP: 850ml x24 hou52m/O: -9596ml since ad74m Edema: non-pitting edema to BLE per RN assessment  Admission weight 73.3 kg Current weight 70.7 kg  Medications: Pepcid, Novolog SSI Q4H, 20 units BID Semglee, MVI with minerals, Miralax, Senokot-S Labs reviewed. CBGs: 78-152 x24 hours  Diet Order:   Diet Order             Diet NPO time specified Except for: Ice Chips  Diet effective now                  EDUCATION NEEDS:  No education needs have been identified at this time  Skin:  Skin Assessment: Skin Integrity Issues: Skin Integrity Issues:: Incisions Incisions: Neck  Last BM:  10/25  Height:  Ht Readings from Last 1 Encounters:  10/19/20 6' (1.829 m)   Weight:  Wt Readings from Last 1 Encounters:  11/09/20 70.7 kg   Ideal Body Weight:  80.9 kg  BMI:  Body mass index is 21.14 kg/m.  Estimated Nutritional Needs:  Kcal:  2250-2450 Protein:  115-130 grams  Fluid:  >/= 2.3 L    Larkin Ina, MS, RD, LDN (she/her/hers) RD pager number and weekend/on-call pager number located in [place].

## 2020-11-09 NOTE — Progress Notes (Signed)
PT home cpap set up for PT use when ready.

## 2020-11-10 ENCOUNTER — Other Ambulatory Visit: Payer: Self-pay

## 2020-11-10 ENCOUNTER — Encounter (HOSPITAL_COMMUNITY): Payer: Self-pay | Admitting: Physical Medicine & Rehabilitation

## 2020-11-10 ENCOUNTER — Inpatient Hospital Stay (HOSPITAL_COMMUNITY)
Admission: RE | Admit: 2020-11-10 | Discharge: 2020-11-18 | DRG: 947 | Disposition: A | Discharge status: Home Health | Payer: Medicare Other | Source: Intra-hospital | Attending: Physical Medicine & Rehabilitation | Admitting: Physical Medicine & Rehabilitation

## 2020-11-10 DIAGNOSIS — R5381 Other malaise: Secondary | ICD-10-CM | POA: Diagnosis present

## 2020-11-10 DIAGNOSIS — R1310 Dysphagia, unspecified: Secondary | ICD-10-CM | POA: Diagnosis not present

## 2020-11-10 DIAGNOSIS — Z23 Encounter for immunization: Secondary | ICD-10-CM

## 2020-11-10 DIAGNOSIS — Z83438 Family history of other disorder of lipoprotein metabolism and other lipidemia: Secondary | ICD-10-CM | POA: Diagnosis not present

## 2020-11-10 DIAGNOSIS — T43595A Adverse effect of other antipsychotics and neuroleptics, initial encounter: Secondary | ICD-10-CM | POA: Diagnosis not present

## 2020-11-10 DIAGNOSIS — Y92239 Unspecified place in hospital as the place of occurrence of the external cause: Secondary | ICD-10-CM | POA: Diagnosis not present

## 2020-11-10 DIAGNOSIS — Z8349 Family history of other endocrine, nutritional and metabolic diseases: Secondary | ICD-10-CM

## 2020-11-10 DIAGNOSIS — F259 Schizoaffective disorder, unspecified: Secondary | ICD-10-CM | POA: Diagnosis present

## 2020-11-10 DIAGNOSIS — F411 Generalized anxiety disorder: Secondary | ICD-10-CM

## 2020-11-10 DIAGNOSIS — Z818 Family history of other mental and behavioral disorders: Secondary | ICD-10-CM

## 2020-11-10 DIAGNOSIS — Z79899 Other long term (current) drug therapy: Secondary | ICD-10-CM

## 2020-11-10 DIAGNOSIS — F3131 Bipolar disorder, current episode depressed, mild: Secondary | ICD-10-CM | POA: Diagnosis present

## 2020-11-10 DIAGNOSIS — Z931 Gastrostomy status: Secondary | ICD-10-CM | POA: Diagnosis not present

## 2020-11-10 DIAGNOSIS — J9601 Acute respiratory failure with hypoxia: Secondary | ICD-10-CM | POA: Diagnosis present

## 2020-11-10 DIAGNOSIS — Z87891 Personal history of nicotine dependence: Secondary | ICD-10-CM

## 2020-11-10 DIAGNOSIS — G4733 Obstructive sleep apnea (adult) (pediatric): Secondary | ICD-10-CM | POA: Diagnosis present

## 2020-11-10 DIAGNOSIS — E785 Hyperlipidemia, unspecified: Secondary | ICD-10-CM | POA: Diagnosis present

## 2020-11-10 DIAGNOSIS — Z807 Family history of other malignant neoplasms of lymphoid, hematopoietic and related tissues: Secondary | ICD-10-CM

## 2020-11-10 DIAGNOSIS — J189 Pneumonia, unspecified organism: Secondary | ICD-10-CM | POA: Diagnosis present

## 2020-11-10 DIAGNOSIS — E1165 Type 2 diabetes mellitus with hyperglycemia: Secondary | ICD-10-CM | POA: Diagnosis not present

## 2020-11-10 DIAGNOSIS — F3189 Other bipolar disorder: Secondary | ICD-10-CM

## 2020-11-10 DIAGNOSIS — F909 Attention-deficit hyperactivity disorder, unspecified type: Secondary | ICD-10-CM | POA: Diagnosis present

## 2020-11-10 DIAGNOSIS — Z981 Arthrodesis status: Secondary | ICD-10-CM | POA: Diagnosis not present

## 2020-11-10 DIAGNOSIS — Z7984 Long term (current) use of oral hypoglycemic drugs: Secondary | ICD-10-CM

## 2020-11-10 DIAGNOSIS — R339 Retention of urine, unspecified: Secondary | ICD-10-CM | POA: Diagnosis present

## 2020-11-10 DIAGNOSIS — J690 Pneumonitis due to inhalation of food and vomit: Secondary | ICD-10-CM | POA: Diagnosis not present

## 2020-11-10 DIAGNOSIS — Z8249 Family history of ischemic heart disease and other diseases of the circulatory system: Secondary | ICD-10-CM | POA: Diagnosis not present

## 2020-11-10 DIAGNOSIS — Z833 Family history of diabetes mellitus: Secondary | ICD-10-CM

## 2020-11-10 DIAGNOSIS — I10 Essential (primary) hypertension: Secondary | ICD-10-CM | POA: Diagnosis present

## 2020-11-10 DIAGNOSIS — M542 Cervicalgia: Secondary | ICD-10-CM | POA: Diagnosis present

## 2020-11-10 DIAGNOSIS — R1312 Dysphagia, oropharyngeal phase: Secondary | ICD-10-CM | POA: Diagnosis present

## 2020-11-10 DIAGNOSIS — Z4659 Encounter for fitting and adjustment of other gastrointestinal appliance and device: Secondary | ICD-10-CM | POA: Diagnosis not present

## 2020-11-10 LAB — GLUCOSE, CAPILLARY
Glucose-Capillary: 152 mg/dL — ABNORMAL HIGH (ref 70–99)
Glucose-Capillary: 197 mg/dL — ABNORMAL HIGH (ref 70–99)
Glucose-Capillary: 189 mg/dL — ABNORMAL HIGH (ref 70–99)
Glucose-Capillary: 73 mg/dL (ref 70–99)
Glucose-Capillary: 142 mg/dL — ABNORMAL HIGH (ref 70–99)

## 2020-11-10 LAB — CBC
HCT: 35.6 % — ABNORMAL LOW (ref 39.0–52.0)
Hemoglobin: 11.9 g/dL — ABNORMAL LOW (ref 13.0–17.0)
MCH: 32.7 pg (ref 26.0–34.0)
MCHC: 33.4 g/dL (ref 30.0–36.0)
MCV: 97.8 fL (ref 80.0–100.0)
Platelets: 395 10*3/uL (ref 150–400)
RBC: 3.64 MIL/uL — ABNORMAL LOW (ref 4.22–5.81)
RDW: 12.8 % (ref 11.5–15.5)
WBC: 7 10*3/uL (ref 4.0–10.5)
nRBC: 0 % (ref 0.0–0.2)

## 2020-11-10 LAB — CREATININE, SERUM
Creatinine, Ser: 0.7 mg/dL (ref 0.61–1.24)
GFR, Estimated: >60 mL/min (ref >60)

## 2020-11-10 MED ORDER — OLANZAPINE 2.5 MG PO TABS
2.5000 mg | ORAL_TABLET | Freq: Every day | ORAL | Status: DC
  Administered 2020-11-11: 2.5 mg
  Filled 2020-11-10: qty 1

## 2020-11-10 MED ORDER — ROSUVASTATIN CALCIUM 20 MG PO TABS
20.0000 mg | ORAL_TABLET | Freq: Every day | ORAL | Status: DC
  Administered 2020-11-10 – 2020-11-17 (×8): 20 mg
  Filled 2020-11-10 (×8): qty 1

## 2020-11-10 MED ORDER — HYDROCODONE-ACETAMINOPHEN 5-325 MG PO TABS
1.0000 | ORAL_TABLET | ORAL | Status: DC | PRN
  Administered 2020-11-10 – 2020-11-18 (×36): 2
  Filled 2020-11-10 (×36): qty 2

## 2020-11-10 MED ORDER — ENOXAPARIN SODIUM 40 MG/0.4ML IJ SOSY: 40.0000 mg | PREFILLED_SYRINGE | INTRAMUSCULAR | Status: DC

## 2020-11-10 MED ORDER — LIP MEDEX EX OINT
TOPICAL_OINTMENT | CUTANEOUS | Status: DC | PRN
  Filled 2020-11-10: qty 7

## 2020-11-10 MED ORDER — THIAMINE HCL 100 MG PO TABS
200.0000 mg | ORAL_TABLET | Freq: Every day | ORAL | Status: DC
  Administered 2020-11-11 – 2020-11-18 (×8): 200 mg
  Filled 2020-11-10 (×8): qty 2

## 2020-11-10 MED ORDER — OLANZAPINE 5 MG PO TABS: 5.0000 mg | ORAL_TABLET | Freq: Every day | ORAL | 0 refills | Status: DC

## 2020-11-10 MED ORDER — POLYETHYLENE GLYCOL 3350 17 G PO PACK
17.0000 g | PACK | Freq: Every day | ORAL | Status: DC | PRN
Start: 2020-11-10 — End: 2020-11-18

## 2020-11-10 MED ORDER — SENNOSIDES-DOCUSATE SODIUM 8.6-50 MG PO TABS
1.0000 | ORAL_TABLET | Freq: Two times a day (BID) | ORAL | Status: DC
  Administered 2020-11-10 – 2020-11-18 (×16): 1
  Filled 2020-11-10 (×16): qty 1

## 2020-11-10 MED ORDER — OXYCODONE-ACETAMINOPHEN 5-325 MG PO TABS
1.0000 | ORAL_TABLET | Freq: Four times a day (QID) | ORAL | Status: DC | PRN
Start: 2020-11-10 — End: 2020-11-10

## 2020-11-10 MED ORDER — INSULIN ASPART 100 UNIT/ML IJ SOLN
3.0000 [IU] | Freq: Four times a day (QID) | INTRAMUSCULAR | Status: DC
  Administered 2020-11-10 – 2020-11-18 (×27): 3 [IU] via SUBCUTANEOUS

## 2020-11-10 MED ORDER — LORAZEPAM 0.5 MG PO TABS: 0.5000 mg | ORAL_TABLET | Freq: Two times a day (BID) | ORAL | 0 refills | Status: DC

## 2020-11-10 MED ORDER — LORAZEPAM 0.5 MG PO TABS
0.5000 mg | ORAL_TABLET | Freq: Two times a day (BID) | ORAL | Status: DC | PRN
  Administered 2020-11-11 – 2020-11-15 (×10): 0.5 mg via ORAL
  Filled 2020-11-10 (×12): qty 1

## 2020-11-10 MED ORDER — VALPROIC ACID 250 MG/5ML PO SOLN: 500.0000 mg | Freq: Three times a day (TID) | ORAL | 0 refills | Status: DC

## 2020-11-10 MED ORDER — PROSOURCE TF PO LIQD
45.0000 mL | Freq: Every day | ORAL | Status: DC
  Administered 2020-11-10 – 2020-11-18 (×9): 45 mL
  Filled 2020-11-10 (×9): qty 45

## 2020-11-10 MED ORDER — POLYETHYLENE GLYCOL 3350 17 G PO PACK
17.0000 g | PACK | Freq: Every day | ORAL | Status: DC
  Administered 2020-11-11 – 2020-11-18 (×8): 17 g
  Filled 2020-11-10 (×7): qty 1

## 2020-11-10 MED ORDER — DOCUSATE SODIUM 50 MG/5ML PO LIQD: 100.0000 mg | Freq: Two times a day (BID) | ORAL | Status: DC | PRN

## 2020-11-10 MED ORDER — FLUOXETINE HCL 20 MG/5ML PO SOLN: 40.0000 mg | Freq: Every day | ORAL | 3 refills | Status: DC

## 2020-11-10 MED ORDER — INSULIN ASPART 100 UNIT/ML IJ SOLN
3.0000 [IU] | Freq: Four times a day (QID) | INTRAMUSCULAR | Status: DC
  Administered 2020-11-10: 3 [IU] via SUBCUTANEOUS

## 2020-11-10 MED ORDER — ADULT MULTIVITAMIN W/MINERALS CH
1.0000 | ORAL_TABLET | Freq: Every day | ORAL | Status: DC
  Administered 2020-11-11 – 2020-11-18 (×8): 1
  Filled 2020-11-10 (×8): qty 1

## 2020-11-10 MED ORDER — OLANZAPINE 2.5 MG PO TABS: 2.5000 mg | ORAL_TABLET | Freq: Every day | ORAL | 0 refills | Status: DC

## 2020-11-10 MED ORDER — VALPROIC ACID 250 MG/5ML PO SOLN
500.0000 mg | Freq: Three times a day (TID) | ORAL | Status: DC
  Administered 2020-11-10 – 2020-11-18 (×24): 500 mg
  Filled 2020-11-10 (×24): qty 10

## 2020-11-10 MED ORDER — OLANZAPINE 5 MG PO TABS
5.0000 mg | ORAL_TABLET | Freq: Every day | ORAL | Status: DC
  Administered 2020-11-10: 5 mg
  Filled 2020-11-10 (×2): qty 1

## 2020-11-10 MED ORDER — TAMSULOSIN HCL 0.4 MG PO CAPS: 0.4000 mg | ORAL_CAPSULE | Freq: Every day | ORAL | Status: DC

## 2020-11-10 MED ORDER — GLUCERNA 1.2 CAL PO LIQD
474.0000 mL | Freq: Three times a day (TID) | ORAL | Status: DC
  Administered 2020-11-10 – 2020-11-18 (×31): 474 mL
  Filled 2020-11-10 (×2): qty 1000
  Filled 2020-11-10: qty 474
  Filled 2020-11-10 (×3): qty 1000
  Filled 2020-11-10: qty 474
  Filled 2020-11-10 (×2): qty 1000
  Filled 2020-11-10 (×2): qty 474
  Filled 2020-11-10: qty 1000
  Filled 2020-11-10 (×3): qty 474
  Filled 2020-11-10 (×4): qty 1000
  Filled 2020-11-10 (×2): qty 474
  Filled 2020-11-10 (×4): qty 1000
  Filled 2020-11-10 (×5): qty 474
  Filled 2020-11-10 (×2): qty 1000
  Filled 2020-11-10 (×4): qty 474
  Filled 2020-11-10: qty 1000
  Filled 2020-11-10: qty 474

## 2020-11-10 MED ORDER — INSULIN ASPART 100 UNIT/ML IJ SOLN: 3.0000 [IU] | Freq: Four times a day (QID) | INTRAMUSCULAR | 0 refills | Status: DC

## 2020-11-10 MED ORDER — INSULIN ASPART 100 UNIT/ML IJ SOLN
0.0000 [IU] | Freq: Four times a day (QID) | INTRAMUSCULAR | Status: DC
  Administered 2020-11-10: 2 [IU] via SUBCUTANEOUS
  Administered 2020-11-11: 3 [IU] via SUBCUTANEOUS
  Administered 2020-11-11: 2 [IU] via SUBCUTANEOUS
  Administered 2020-11-12: 3 [IU] via SUBCUTANEOUS
  Administered 2020-11-12: 5 [IU] via SUBCUTANEOUS
  Administered 2020-11-12: 2 [IU] via SUBCUTANEOUS
  Administered 2020-11-13: 5 [IU] via SUBCUTANEOUS
  Administered 2020-11-13 (×2): 3 [IU] via SUBCUTANEOUS
  Administered 2020-11-14: 8 [IU] via SUBCUTANEOUS
  Administered 2020-11-14: 3 [IU] via SUBCUTANEOUS
  Administered 2020-11-14: 2 [IU] via SUBCUTANEOUS
  Administered 2020-11-15: 5 [IU] via SUBCUTANEOUS
  Administered 2020-11-15: 3 [IU] via SUBCUTANEOUS
  Administered 2020-11-15: 2 [IU] via SUBCUTANEOUS
  Administered 2020-11-16: 5 [IU] via SUBCUTANEOUS
  Administered 2020-11-16: 3 [IU] via SUBCUTANEOUS
  Administered 2020-11-16: 8 [IU] via SUBCUTANEOUS
  Administered 2020-11-17: 2 [IU] via SUBCUTANEOUS
  Administered 2020-11-17: 8 [IU] via SUBCUTANEOUS
  Administered 2020-11-17: 5 [IU] via SUBCUTANEOUS
  Administered 2020-11-18: 8 [IU] via SUBCUTANEOUS

## 2020-11-10 MED ORDER — LORAZEPAM 0.5 MG PO TABS: 0.5000 mg | ORAL_TABLET | Freq: Two times a day (BID) | ORAL | 0 refills | Status: DC | PRN

## 2020-11-10 MED ORDER — BUPROPION HCL 100 MG PO TABS: 100.0000 mg | ORAL_TABLET | Freq: Two times a day (BID) | ORAL | 0 refills | Status: DC

## 2020-11-10 MED ORDER — INSULIN GLARGINE-YFGN 100 UNIT/ML ~~LOC~~ SOLN
10.0000 [IU] | Freq: Two times a day (BID) | SUBCUTANEOUS | Status: DC
  Administered 2020-11-10 – 2020-11-18 (×16): 10 [IU] via SUBCUTANEOUS
  Filled 2020-11-10 (×17): qty 0.1

## 2020-11-10 MED ORDER — FLUOXETINE HCL 20 MG/5ML PO SOLN
40.0000 mg | Freq: Every day | ORAL | Status: DC
  Administered 2020-11-11 – 2020-11-17 (×7): 40 mg
  Filled 2020-11-10 (×8): qty 10

## 2020-11-10 MED ORDER — INSULIN GLARGINE-YFGN 100 UNIT/ML ~~LOC~~ SOLN
10.0000 [IU] | Freq: Two times a day (BID) | SUBCUTANEOUS | Status: DC
  Filled 2020-11-10: qty 0.1

## 2020-11-10 MED ORDER — ENOXAPARIN SODIUM 40 MG/0.4ML IJ SOSY
40.0000 mg | PREFILLED_SYRINGE | INTRAMUSCULAR | Status: DC
  Administered 2020-11-11 – 2020-11-18 (×8): 40 mg via SUBCUTANEOUS
  Filled 2020-11-10 (×8): qty 0.4

## 2020-11-10 MED ORDER — IPRATROPIUM-ALBUTEROL 0.5-2.5 (3) MG/3ML IN SOLN: 3.0000 mL | Freq: Four times a day (QID) | RESPIRATORY_TRACT | Status: DC | PRN

## 2020-11-10 MED ORDER — FREE WATER
185.0000 mL | Freq: Three times a day (TID) | Status: DC
  Administered 2020-11-10 – 2020-11-18 (×23): 185 mL

## 2020-11-10 MED ORDER — FAMOTIDINE 20 MG PO TABS
20.0000 mg | ORAL_TABLET | Freq: Two times a day (BID) | ORAL | Status: DC
  Administered 2020-11-10 – 2020-11-18 (×16): 20 mg
  Filled 2020-11-10 (×17): qty 1

## 2020-11-10 MED ORDER — INSULIN ASPART 100 UNIT/ML IJ SOLN
0.0000 [IU] | Freq: Four times a day (QID) | INTRAMUSCULAR | Status: DC
  Administered 2020-11-10: 3 [IU] via SUBCUTANEOUS

## 2020-11-10 MED ORDER — BUPROPION HCL 100 MG PO TABS
100.0000 mg | ORAL_TABLET | Freq: Two times a day (BID) | ORAL | Status: DC
  Administered 2020-11-10 – 2020-11-11 (×2): 100 mg
  Filled 2020-11-10 (×2): qty 1

## 2020-11-10 MED ORDER — INFLUENZA VAC SPLIT QUAD 0.5 ML IM SUSY
0.5000 mL | PREFILLED_SYRINGE | INTRAMUSCULAR | Status: AC
  Administered 2020-11-11: 0.5 mL via INTRAMUSCULAR
  Filled 2020-11-10: qty 0.5

## 2020-11-10 MED ORDER — OLANZAPINE 2.5 MG PO TABS
2.5000 mg | ORAL_TABLET | Freq: Two times a day (BID) | ORAL | Status: DC | PRN
  Filled 2020-11-10: qty 1
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
## 2020-11-10 NOTE — Progress Notes (Signed)
Patient arrived to unit with mother. Oriented to unit and verbalized agreement to call for needs and not get out of bed without assistance.

## 2020-11-10 NOTE — Progress Notes (Signed)
Inpatient Diabetes Program Recommendations  AACE/ADA: New Consensus Statement on Inpatient Glycemic Control (2015)  Target Ranges:  Prepandial:   less than 140 mg/dL      Peak postprandial:   less than 180 mg/dL (1-2 hours)      Critically ill patients:  140 - 180 mg/dL   Lab Results  Component Value Date   GLUCAP 197 (H) 11/10/2020   HGBA1C 8.0 (H) 10/16/2020    Review of Glycemic Control Results for Edward Garner, Edward Garner (MRN [id number]) as of 11/10/2020 10:37  Ref. Range 11/09/2020 15:41 11/09/2020 20:00 11/09/2020 23:23 11/10/2020 03:38 11/10/2020 07:44  Glucose-Capillary Latest Ref Range: 70 - 99 mg/dL 749 (H) 449 (H) 675 (H) 152 (H) 197 (H)   Diabetes history: DM 2 Outpatient Diabetes medications: Jardiance 10 mg daily, Metformin 2000 mg with breakfast, Trulicity 3 mg weekly,  Current orders for Inpatient glycemic control:  Glucerna 474 ml 4 times a day (0800, 1200, 1600, 2000) Semglee 20 units bid  Novolog moderate q 4 hours  Inpatient Diabetes Program Recommendations:    May consider reducing Semglee to 10 units bid since patient not on continuous tube feeds.  Also consider changing Novolog correction to 0800, 1200, 1600, and 2000 -prior to tube feed boluses.  May need tube feed coverage added with boluses as well.  Will follow.   Thanks,  Beryl Meager, RN, BC-ADM Inpatient Diabetes Coordinator Pager [phone]  (8a-5p)

## 2020-11-10 NOTE — Progress Notes (Signed)
Occupational Therapy Treatment Patient Details Name: Edward Garner MRN: [id number] DOB: 03/18/1965 Today's Date: 11/10/2020   History of present illness Pt is a 55 y.o. male admitted 10/19/20 with AMS, lethargy, hypoxia; workup for multifocal PNA. Head CT negative for acute abnormality. Of note, recent admission 10/3-10/4/22 for C3-6 ACDF; brief ER visit 10/4 for syncope. Had PEG placed 11/08/20 due to ongoing dysphagia. Other PMH includes DM, HTN, HLD, OSA, seizure, schizoaffective disorder.   OT comments  Patient engaged in ADLs today with up to min guard assist for LB dressing and min assist for transfers/mobility in room using RW.  Continues to require cueing for safety, problem solving and following multiple step commands when distracting environment.  He is very eager to dc to CIR.  VSS on RA today, SpO2 92-95%. Limited by pain at PEG tube site.  Will follow.    Recommendations for follow up therapy are one component of a multi-disciplinary discharge planning process, led by the attending physician.  Recommendations may be updated based on patient status, additional functional criteria and insurance authorization.    Follow Up Recommendations  Acute inpatient rehab (3hours/day)    Assistance Recommended at Discharge Frequent or constant Supervision/Assistance  Equipment Recommendations  Other (comment) (defer to next venue)    Recommendations for Other Services      Precautions / Restrictions Precautions Precautions: Fall Precaution Comments: monitor BP; PEG Required Braces or Orthoses: Cervical Brace Cervical Brace: Soft collar;For comfort Restrictions Weight Bearing Restrictions: No       Mobility Bed Mobility Overal bed mobility: Needs Assistance Bed Mobility: Rolling;Sidelying to Sit;Sit to Sidelying Rolling: Supervision Sidelying to sit: Supervision     Sit to sidelying: Supervision General bed mobility comments: supervision for safety, assist for line mgmt     Transfers Overall transfer level: Needs assistance Equipment used: Rolling walker (2 wheels) Transfers: Sit to/from Stand Sit to Stand: Min assist           General transfer comment: min assist to power up and steady     Balance Overall balance assessment: Needs assistance Sitting-balance support: Feet supported Sitting balance-Leahy Scale: Good     Standing balance support: Bilateral upper extremity supported;No upper extremity supported;During functional activity Standing balance-Leahy Scale: Poor Standing balance comment: relies on RW and external min guard support                           ADL either performed or assessed with clinical judgement   ADL Overall ADL's : Needs assistance/impaired     Grooming: Wash/dry hands;Wash/dry face;Min guard;Standing               Lower Body Dressing: Min guard;Sit to/from stand Lower Body Dressing Details (indicate cue type and reason): able to don/doff socks, min guard sit to stand Toilet Transfer: Minimal assistance;Ambulation;Rolling walker (2 wheels) Toilet Transfer Details (indicate cue type and reason): simulated in room         Functional mobility during ADLs: Minimal assistance;Rolling walker (2 wheels);Cueing for safety       Vision       Perception     Praxis      Cognition Arousal/Alertness: Awake/alert Behavior During Therapy: WFL for tasks assessed/performed Overall Cognitive Status: Impaired/Different from baseline Area of Impairment: Safety/judgement;Problem solving;Following commands;Attention;Awareness                   Current Attention Level: Selective   Following Commands: Follows multi-step commands inconsistently Safety/Judgement: Decreased  awareness of deficits;Decreased awareness of safety Awareness: Emergent Problem Solving: Slow processing;Difficulty sequencing;Requires verbal cues General Comments: pt remains with difficulty following mulitple step commands  in distracting enviornment, poor safety awareness          Exercises     Shoulder Instructions       General Comments pt on 2L upon entry, removed O2 and maintained 92-95% SpO2 on RA    Pertinent Vitals/ Pain       Pain Assessment: Faces Faces Pain Scale: Hurts even more Pain Location: abodmen at peg site Pain Descriptors / Indicators: Guarding;Sore Pain Intervention(s): Limited activity within patient's tolerance;Monitored during session;Patient requesting pain meds-RN notified;Repositioned  Home Living     Available Help at Discharge:  (Mom, stepsister and hired assist after d/c)               Bathroom Shower/Tub: Research scientist (medical): Yes How Accessible: Accessible via walker        Lives With: Alone    Prior Functioning/Environment              Frequency  Min 2X/week        Progress Toward Goals  OT Goals(current goals can now be found in the care plan section)  Progress towards OT goals: Progressing toward goals  Acute Rehab OT Goals OT Goal Formulation: With patient  Plan Discharge plan remains appropriate;Frequency remains appropriate    Co-evaluation                 AM-PAC OT "6 Clicks" Daily Activity     Outcome Measure   Help from another person eating meals?: Total Help from another person taking care of personal grooming?: A Little Help from another person toileting, which includes using toliet, bedpan, or urinal?: A Little Help from another person bathing (including washing, rinsing, drying)?: A Little Help from another person to put on and taking off regular upper body clothing?: A Little Help from another person to put on and taking off regular lower body clothing?: A Little 6 Click Score: 16    End of Session Equipment Utilized During Treatment: Rolling walker (2 wheels)  OT Visit Diagnosis: Unsteadiness on feet (R26.81);Other abnormalities of gait and mobility (R26.89);Muscle weakness  (generalized) (M62.81);Other symptoms and signs involving cognitive function   Activity Tolerance Patient tolerated treatment well   Patient Left in bed;with call bell/phone within reach;with bed alarm set   Nurse Communication Mobility status        Time: 1610-9604 OT Time Calculation (min): 28 min  Charges: OT General Charges $OT Visit: 1 Visit OT Treatments $Self Care/Home Management : 23-37 mins  Barry Brunner, OT Acute Rehabilitation Services Pager [phone] Office [phone]   Chancy Milroy 11/10/2020, 10:04 AM

## 2020-11-10 NOTE — Progress Notes (Signed)
Per patient and family, patient had been retaining urine when using condom cath. Emptying when has chance to use toilet. Scanned for 460, toileted, residual only 18. Removed condom catheter and encouraging toileting instead. Patient agreeable.
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
## 2020-11-10 NOTE — Progress Notes (Signed)
Inpatient Rehabilitation Admissions Coordinator   Inpatient acute rehab/CIR bed is available to admit to today. I met with patient at bedside and he is aware and I spoke with his Mom by phone and she is also in agreement. I will alert acute team and TOC to make the arrangements to admit today.  Danne Baxter, RN, MSN Rehab Admissions Coordinator [phone] 11/10/2020 10:00 AM
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
## 2020-11-10 NOTE — Progress Notes (Signed)
Initial Nutrition Assessment  DOCUMENTATION CODES:   Not applicable  INTERVENTION:  Continue bolus tube feeds using Glucerna 1.2 cal formula via PEG at volumes of 474 ml (2 ARC/cartons) given QID.   Provide 45 ml Prosource TF once daily per tube.  Provide free water flushes of 185 ml TID per tube.   Tube feeding regimen provides 2315 kcal, 125 grams of protein, and 2091 ml free water.  NUTRITION DIAGNOSIS:   Inadequate oral intake related to inability to eat as evidenced by NPO status.  GOAL:   Patient will meet greater than or equal to 90% of their needs  MONITOR:   TF tolerance, Labs, Weight trends, I & O's, Skin  REASON FOR ASSESSMENT:   Consult Enteral/tube feeding initiation and management  ASSESSMENT:   55 year old right-handed male with history of tobacco use/OSA, hypertension, hyperlipidemia, diabetes type 2, bipolar/schizoaffective disorder as well as recent C3-6 ACDF 10/16/2020. With noted findings on CT scan of prevertebral swelling compared to  scan 10/17/2020 a nasogastric tube was placed for nutritional support for dysphagia. Underwent PEG tube placement 11/08/2020. Pt with Debility/syncope/altered mental status secondary to acute hypoxic respiratory failure in the setting of multifocal pneumonia with history of OSA. Therapy evaluations completed due to patient decreased functional mobility altered mental status was admitted for a comprehensive rehab program.  Pt continues on NPO with tube feeding via PEG. Pt transitioned to bolus feeds yesterday for anticipation of CIR admission. Plans to continue with current bolus tube feeding regimen. Noted pt with a 8% weight loss over the past month. RD to continue to closely monitor weight trends. Unable to complete Nutrition-Focused physical exam at this time.   Labs and medications reviewed.   Diet Order:   Diet Order             Diet NPO time specified Except for: Ice Chips  Diet effective now                    EDUCATION NEEDS:   Not appropriate for education at this time  Skin:  Skin Assessment: Skin Integrity Issues: Skin Integrity Issues:: Incisions Incisions: neck  Last BM:  10/25  Height:   Ht Readings from Last 1 Encounters:  11/10/20 6' (1.829 m)    Weight:   Wt Readings from Last 1 Encounters:  11/10/20 71.1 kg    Ideal Body Weight:  80.9 kg  BMI:  Body mass index is 21.26 kg/m.  Estimated Nutritional Needs:   Kcal:  2257-5051  Protein:  115-130 grams  Fluid:  >/= 2.3 L/day  Roslyn Smiling, MS, RD, LDN RD pager number/after hours weekend pager number on Amion.
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
## 2020-11-10 NOTE — Discharge Summary (Signed)
Physician Discharge Summary  Edward Garner ZOX:[id number] DOB: 08-11-1965 DOA: 10/19/2020  PCP: [name], MD  Admit date: 10/19/2020 Discharge date: 11/10/2020 30 Day Unplanned Readmission Risk Score    Flowsheet Row ED to Hosp-Admission (Current) from 10/19/2020 in [facility]  30 Day Unplanned Readmission Risk Score (%) 25.94 Filed at 11/10/2020 1201       This score is the patient's risk of an unplanned readmission within 30 days of being discharged (0 -100%). The score is based on dignosis, age, lab data, medications, orders, and past utilization.   Low:  0-14.9   Medium: 15-21.9   High: 22-29.9   Extreme: 30 and above          Admitted From: Home Disposition: CIR  Recommendations for Outpatient Follow-up:  Follow up with PCP in 1-2 weeks Please obtain BMP/CBC in one week Please follow up with your PCP on the following pending results: Unresulted Labs (From admission, onward)     Start     Ordered   Signed and Held  CBC  (enoxaparin (LOVENOX)    CrCl >/= 30 ml/min)  Once,   R       Comments: Baseline for enoxaparin therapy IF NOT ALREADY DRAWN.  Notify MD if PLT < 100 K.   Question:  Specimen collection method  Answer:  Lab=Lab collect   Signed and Held   Signed and Held  Creatinine, serum  (enoxaparin (LOVENOX)    CrCl >/= 30 ml/min)  Once,   R       Comments: Baseline for enoxaparin therapy IF NOT ALREADY DRAWN.   Question:  Specimen collection method  Answer:  Lab=Lab collect   Signed and Held   Signed and Held  Creatinine, serum  (enoxaparin (LOVENOX)    CrCl >/= 30 ml/min)  Weekly,   R     Comments: while on enoxaparin therapy   Question:  Specimen collection method  Answer:  Lab=Lab collect   Signed and Held   Signed and Held  Comprehensive metabolic panel  Tomorrow morning,   R       Question:  Specimen collection method  Answer:  Lab=Lab collect   Signed and Held   Signed and Held  CBC WITH DIFFERENTIAL  Once,   R        Question:  Specimen collection method  Answer:  Lab=Lab collect   Signed and Held              Home Health: None Equipment/Devices: None  Discharge Condition: Stable CODE STATUS: Full code Diet recommendation: Through PEG tube  Subjective: Seen and examined.  No complaints.  Brief/Interim Summary: Patient is a 55 year old Caucasian male with past medical history significant for cigarette use, OSA, hypertension, hyperlipidemia, diabetes type 2, seizure and cervical spondylosis/stenosis status post ACDF on 10/16/2020.  Patient was admitted from home with confusion.  Patient recently underwent cervical ACDF C3 C6 by Dr. [name] on 10/16/2020 and discharged home on 10/17/2020.  Patient presented to the ER later same day after a syncopal episode at home.  CT head and cervical spine was negative, patient was discharged back home.  Patient later presented with progressive lethargy, hypoxic respiratory failure with O2 sat in the 70s that responded to Narcan, poor oral intake due to ongoing pain and some difficulty with swallowing.  On presentation, CTA was negative for PE.  CT cervical which showed slightly increased prevertebral swelling compared to 10/17/2020.  Patient was also found to have multifocal  pneumonia.,   Significant Hospital events: 10/3 cervical ACDF C3-C6 with Dr. [name], discharged home 10/4 10/4 syncope at home, ER eval neg, discharged home 10/6 Admit to PCCM for acute hypoxic respiratory failure 2/2 multifocal pna, on BiPAP 10/7 On 11L [place]   1-Acute Hypoxic respiratory failure in the setting of multifocal pneumonia: -Concern for aspiration event with prevertebral swelling status post C-spine fusion -History of OSA on CPAP -Patient completed 7 days of Unasyn on 10/26/2020. -Patient has been off of supplemental oxygen since 10/31/2020.   2-Dysphagia: -CT with slightly increased prevertebral swelling compared to 10/17/2020 -Continue with core track and tube fee is speech followed,  due to no improvement, MBS was done twice, then speech recommended PEG tube.  Ding. Speech following.  IR consulted for PEG tube placement.  He underwent PEG tube placement on 11/08/2020.  PEG tube feedings were started 11/09/2020 which patient is tolerating well and will continue at CIR.   Hypernatremia:; Resolved.   Diabetes type 2: Blood sugar very well controlled.  Will be discharged on the regimen as below.   Seizure disorder: Continue with Depakote.    Bipolar:  Patient was very closely followed by psychiatry team.  Please refer to medications that are in this discharge summary, they are per recommendations from psychiatry.   Psychiatry recommended that they should be consulted again once patient arrives at [facility], please consult psychiatry.  Hypomagnesemia: -Resolved.   Hypertension: -Labile and mostly low but he is asymptomatic. -Cozaar was discontinued on 10/27/2020 due to low blood pressure. -Metoprolol was also discontinued early morning of 10/31/2020 due to hypotension.   OSA: -Patient uses CPAP at night.  But refuses intermittently.     Intermittent suicidal statements:  As per prior documentation: "Patient continued to make suicidal statements intermittently.  He is being seen by psychiatry very closely.  He was under IVC at 1 point in time which was discontinued several days ago.  Psychiatry opined that his statements are just a statements and he does not have intention to hurt himself.  Discharge Diagnoses:  Active Problems:   Hypoxemia   Acute respiratory failure with hypoxia (HCC)   Malnutrition of moderate degree   Delirium due to another medical condition   Polypharmacy   Dysphagia   Aspiration pneumonia (HCC)   Encounter for feeding tube placement    Discharge Instructions   Allergies as of 11/10/2020       Reactions   Gramineae Pollens         Medication List     STOP taking these medications    ALPRAZolam 1 MG 24 hr tablet Commonly known  as: XANAX XR   amphetamine-dextroamphetamine 20 MG tablet Commonly known as: ADDERALL   buPROPion 300 MG 24 hr tablet Commonly known as: WELLBUTRIN XL Replaced by: buPROPion 100 MG tablet   cefUROXime 500 MG tablet Commonly known as: CEFTIN   divalproex 500 MG 24 hr tablet Commonly known as: DEPAKOTE ER   FLUoxetine 40 MG capsule Commonly known as: PROZAC Replaced by: FLUoxetine 20 MG/5ML solution   lisdexamfetamine 50 MG capsule Commonly known as: VYVANSE   losartan 50 MG tablet Commonly known as: COZAAR   prazosin 1 MG capsule Commonly known as: MINIPRESS   zolpidem 10 MG tablet Commonly known as: AMBIEN       TAKE these medications    buPROPion 100 MG tablet Commonly known as: WELLBUTRIN Place 1 tablet (100 mg total) into feeding tube 2 (two) times daily. Replaces: buPROPion 300 MG 24 hr tablet  cycloSPORINE 0.05 % ophthalmic emulsion Commonly known as: RESTASIS Place 1 drop into both eyes 2 (two) times daily.   FLUoxetine 20 MG/5ML solution Commonly known as: PROZAC Place 10 mLs (40 mg total) into feeding tube daily. Start taking on: November 11, 2020 Replaces: FLUoxetine 40 MG capsule   glipiZIDE 5 MG 24 hr tablet Commonly known as: GLUCOTROL XL Take 1 tablet (5 mg total) by mouth daily with breakfast. What changed: how much to take   insulin aspart 100 UNIT/ML injection Commonly known as: novoLOG Inject 3 Units into the skin 4 (four) times daily.   Jardiance 10 MG Tabs tablet Generic drug: empagliflozin Take 10 mg by mouth daily.   LORazepam 0.5 MG tablet Commonly known as: Ativan Take 1 tablet (0.5 mg total) by mouth 2 (two) times daily as needed for anxiety.   LORazepam 0.5 MG tablet Commonly known as: Ativan Take 1 tablet (0.5 mg total) by mouth 2 (two) times daily.   metFORMIN 500 MG tablet Commonly known as: GLUCOPHAGE Take 2,000 mg by mouth daily with breakfast.   methocarbamol 500 MG tablet Commonly known as: ROBAXIN Take 1  tablet (500 mg total) by mouth every 6 (six) hours as needed for muscle spasms.   OLANZapine 5 MG tablet Commonly known as: ZYPREXA Take 1 tablet (5 mg total) by mouth at bedtime.   OLANZapine 2.5 MG tablet Commonly known as: ZYPREXA Take 1 tablet (2.5 mg total) by mouth daily. Start taking on: November 11, 2020   oxyCODONE-acetaminophen 10-325 MG tablet Commonly known as: Percocet Take 1 tablet by mouth every 6 (six) hours as needed for pain.   pregabalin 150 MG capsule Commonly known as: LYRICA Take 100 mg by mouth in the morning.   rosuvastatin 20 MG tablet Commonly known as: CRESTOR Take 20 mg by mouth daily.   tamsulosin 0.4 MG Caps capsule Commonly known as: FLOMAX Take 0.4 mg by mouth in the morning.   Trulicity 3 MG/0.5ML Sopn Generic drug: Dulaglutide Inject 3 mg into the skin once a week. What changed: Another medication with the same name was removed. Continue taking this medication, and follow the directions you see here.   valproic acid 250 MG/5ML solution Commonly known as: DEPAKENE Place 10 mLs (500 mg total) into feeding tube 3 (three) times daily.        Follow-up Information     [name], MD Follow up in 1 week(s).   Specialty: Family Medicine Contact information: [phone] [place] [address] [phone]                Allergies  Allergen Reactions   Gramineae Pollens     Consultations: Psychiatry, IR and neurosurgery   Procedures/Studies: CT ABDOMEN WO CONTRAST  Result Date: 11/03/2020 CLINICAL DATA:  Dysphagia.  Possible G-tube placement. EXAM: CT ABDOMEN WITHOUT CONTRAST TECHNIQUE: Multidetector CT imaging of the abdomen was performed following the standard protocol without IV contrast. COMPARISON:  FL swallow study, 11/02/2020. Chest radiograph, 11/01/2020. CTA PE 10/19/2020. CT abdomen pelvis, 10/01/2019. FINDINGS: Lower chest: Dependent bibasilar consolidative opacities, additional radiodensities at  the LEFT lung base-likely aspirated. (See key image) Hepatobiliary: Incidental prominent LEFT hepatic lobe. No focal liver abnormality is seen. No gallstones, gallbladder wall thickening, or biliary dilatation. Pancreas: Normal noncontrast appearance. No pancreatic ductal dilatation or surrounding inflammatory changes. Spleen: Normal in size without focal abnormality. Adrenals/Urinary Tract: Adrenal glands are unremarkable. Absent LEFT kidney. RIGHT renal calculi, largest measuring 0.4 cm. No hydronephrosis. Stomach/Bowel: Enteric feeding  tube, with tip at the pylorus. Nonobstructed small bowel. Imaged portions of colon are nondilated. Retained contrast within colon, likely from recent administration. Vascular/Lymphatic: Aortic atherosclerosis. No enlarged abdominal lymph nodes. Other: No abdominal wall hernia or abnormality. Musculoskeletal: No acute osseous abnormality. IMPRESSION: 1. Bibasilar aspiration pneumonia, including retained contrast within the dependent LEFT lung. 2. Prominent LEFT hepatic lobe and high-riding small-bowel loops at the LEFT upper quadrant. Findings are equivocal for feasibility of percutaneous gastrostomy placement, though an attempt with adequate gastric insufflation could be warranted. Roanna Banning, MD [facility] Electronically Signed   By: Roanna Banning M.D.   On: 11/03/2020 11:33   DG Chest 2 View  Result Date: 11/01/2020 CLINICAL DATA:  Altered mental status and cough EXAM: CHEST - 2 VIEW COMPARISON:  Radiograph 10/27/2020 FINDINGS: Unchanged cardiomediastinal silhouette. There is a nasogastric tube which courses below the diaphragm, tip excluded by collimation. Increased bilateral airspace opacities in the mid to lower lungs. No large pleural effusion or visible pneumothorax. No acute osseous abnormality. Unchanged bilateral shoulder osteoarthritis with multiple large osteochondral joint bodies along the left shoulder.  IMPRESSION: Increased bilateral airspace disease in the mid to lower lungs compatible with worsening multifocal pneumonia. Electronically Signed   By: Caprice Renshaw M.D.   On: 11/01/2020 14:00   DG Ribs Unilateral W/Chest Left  Result Date: 10/17/2020 CLINICAL DATA:  Left-sided rib pain after fall. EXAM: LEFT RIBS AND CHEST - 3+ VIEW COMPARISON:  None. FINDINGS: No fracture or other bone lesions are seen involving the ribs. There is no evidence of pneumothorax or pleural effusion. Both lungs are clear. Heart size and mediastinal contours are within normal limits. Multiple calcified intra-articular bodies in the left shoulder and elbow. IMPRESSION: 1. No acute rib fracture. 2. No acute cardiopulmonary disease. 3. 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 Electronically Signed   By: Obie Dredge M.D.   On: 10/17/2020 18:05   DG Cervical Spine 1 View  Result Date: 11/01/2020 CLINICAL DATA:  Altered mental status and cough, neck pain EXAM: DG CERVICAL SPINE - 1 VIEW COMPARISON:  Cervical spine CT 10/17/2020 FINDINGS: There is a feeding catheter coursing down the esophagus. Postsurgical changes of C3-6 ACDF. There is severe disc height loss at C6-C7. There is mild to moderate multilevel facet arthropathy. Hardware is intact without evidence of loosening. There is mild residual prevertebral soft tissue swelling. IMPRESSION: C3-C6 ACDF without evidence of hardware complication. Mild residual prevertebral soft tissue swelling. Unchanged severe disc height loss at C6-C7 and mild-to-moderate multilevel facet arthropathy. Electronically Signed   By: Caprice Renshaw M.D.   On: 11/01/2020 13:58   DG Cervical Spine 1 View  Result Date: 10/24/2020 CLINICAL DATA:  Status post cervical fusion EXAM: DG CERVICAL SPINE - 1 VIEW COMPARISON:  10/16/2020 intraoperative radiographs, 09/27/2020 cervical radiographs FINDINGS: The cervical spine is  imaged from the skull base through the superior aspect of C6. Redemonstrated ACDF C3-C6. No acute fracture. Alignment is unremarkable. No significant prevertebral edema. Gastric tube is noted overlying the pharynx and esophagus. IMPRESSION: Status post ACDF C3-C6.  No acute abnormality. Electronically Signed   By: Wiliam Ke M.D.   On: 10/24/2020 11:48   DG Cervical Spine 1 View  Result Date: 10/16/2020 CLINICAL DATA:  Cervical fusion EXAM: DG CERVICAL SPINE - 1 VIEW COMPARISON:  09/27/2020 FLUOROSCOPY TIME:  Radiation Exposure Index (as provided by the fluoroscopic device): 1.49 mGy If the device does not provide the  exposure index: Fluoroscopy Time:  17 seconds Number of Acquired Images:  1 FINDINGS: Interbody fusion is noted at C3-4, C4-5 and C5-6 with anterior fixation at all 3 levels. No alignment abnormality is noted. IMPRESSION: Cervical fusion from C3-C6. Electronically Signed   By: Alcide Clever M.D.   On: 10/16/2020 19:29   CT HEAD WO CONTRAST ( )  Result Date: 10/19/2020 CLINICAL DATA:  Head trauma, altered mental status, recent surgery EXAM: CT HEAD WITHOUT CONTRAST TECHNIQUE: Contiguous axial images were obtained from the base of the skull through the vertex without intravenous contrast. COMPARISON:  10/17/2020. FINDINGS: Brain: No evidence of acute infarction, hemorrhage, cerebral edema, mass, mass effect, or midline shift. Ventricles and sulci are within normal limits for age. No extra-axial fluid collection. Vascular: No hyperdense vessel or unexpected calcification. Skull: Normal. Negative for fracture or focal lesion. Sinuses/Orbits: No acute finding. Other: The mastoid air cells are well aerated. IMPRESSION: No acute intracranial process. Electronically Signed   By: Wiliam Ke M.D.   On: 10/19/2020 17:15   CT HEAD WO CONTRAST ( )  Result Date: 10/17/2020 CLINICAL DATA:  Head trauma.  Recent cervical fusion yesterday. EXAM: CT HEAD WITHOUT CONTRAST CT CERVICAL SPINE WITHOUT  CONTRAST TECHNIQUE: Multidetector CT imaging of the head and cervical spine was performed following the standard protocol without intravenous contrast. Multiplanar CT image reconstructions of the cervical spine were also generated. COMPARISON:  Cervical spine x-ray 10/16/2020. FINDINGS: CT HEAD FINDINGS Brain: No evidence of acute infarction, hemorrhage, hydrocephalus, extra-axial collection or mass lesion/mass effect. Vascular: No hyperdense vessel or unexpected calcification. Skull: Normal. Negative for fracture or focal lesion. Sinuses/Orbits: No acute finding. Other: Posterior scalp laceration.  No foreign body. CT CERVICAL SPINE FINDINGS Alignment: Normal. Skull base and vertebrae: No acute fracture. No primary bone lesion or focal pathologic process. Anterior plate and vertebral body screws are seen at C2, C3, C4 and C5. Disc spaces are seen at the surgical levels. Soft tissues and spinal canal: There is some prevertebral soft tissue edema and air as well as air and edema in the right neck and upper mediastinum compatible with recent surgery. No obvious focal fluid collection identified. Central spinal canal soft tissues within normal limits. Disc levels: There is facet arthropathy at multiple levels. Disc space osteophytes are noted at multiple levels. There is disc space narrowing it C6-C7. There is severe neural foraminal stenosis on the left at C4-C5 secondary to facet arthropathy and uncovertebral spurring. There is severe neural foraminal stenosis on the right at C3-C4 secondary to uncovertebral spurring. There is no significant central canal stenosis. Upper chest: Negative. Other: None. IMPRESSION: 1.  No acute intracranial process. 2. No acute fracture or traumatic subluxation of the cervical spine. 3. Anterior fusion C2-C5 with prevertebral swelling and air as well as edema and air in the right neck compatible with recent surgery. No fluid collection identified. Electronically Signed   By: Darliss Cheney M.D.   On: 10/17/2020 17:07   CT Soft Tissue Neck W Contrast  Result Date: 10/19/2020 CLINICAL DATA:  Trouble swallowing, recent neuro surgery EXAM: CT NECK WITH CONTRAST TECHNIQUE: Multidetector CT imaging of the neck was performed using the standard protocol following the bolus administration of intravenous contrast. CONTRAST:  75mL OMNIPAQUE IOHEXOL 350 MG/ML SOLN COMPARISON:  No prior neck CT, correlation is made with CT cervical spine 10/17/2020 FINDINGS: Pharynx and larynx: Significant prevertebral swelling, measuring up to 1.6 cm, previously 1.4 cm when remeasured similarly. Small amounts of air are seen within the edematous  soft tissues, compatible with recent surgery. This narrows the larynx and effaces the piriform recesses, reducing the AP diameter of the larynx at the level of the epiglottis to 5 mm. No focal fluid collection within the parapharyngeal, retropharyngeal, or prevertebral soft tissues. Salivary glands: No inflammation, mass, or stone. Thyroid: Normal. Lymph nodes: None enlarged or abnormal density. Vascular: Atherosclerotic calcifications at the right greater than left carotid bifurcations. Aortic atherosclerotic calcifications. Otherwise negative. Limited intracranial: Negative. Visualized orbits: Status post bilateral lens replacements. Mastoids and visualized paranasal sinuses: Minimal mucosal thickening in the right maxillary sinus. The mastoids are well aerated. Skeleton: Status post C3-C6 ACDF.  No acute osseous abnormality. Upper chest: For findings in the thorax, please see same day CT chest. Other: Small amount of air seen at the anterior right aspect of the thyroid cartilage, likely postoperative, with decreased subcutaneous air overall compared to the prior exam. IMPRESSION: 1. Slightly increased prevertebral swelling compared to 10/17/2020, with decreased soft tissue gas, consistent with recent ACDF. This narrows the AP diameter of the larynx the level of the  epiglottis to 5 mm. No focal collection within the parapharyngeal, retropharyngeal, or prevertebral soft tissues. 2.  For findings in the thorax, please see same day CT chest. Electronically Signed   By: Wiliam Ke M.D.   On: 10/19/2020 17:29   CT Angio Chest PE W and/or Wo Contrast  Result Date: 10/19/2020 CLINICAL DATA:  Recent surgery, hypoxia EXAM: CT ANGIOGRAPHY CHEST WITH CONTRAST TECHNIQUE: Multidetector CT imaging of the chest was performed using the standard protocol during bolus administration of intravenous contrast. Multiplanar CT image reconstructions and MIPs were obtained to evaluate the vascular anatomy. CONTRAST:  60mL OMNIPAQUE IOHEXOL 350 MG/ML SOLN COMPARISON:  Same day chest radiograph FINDINGS: Cardiovascular: There is adequate opacification of the pulmonary arteries to the segmental level. There is no evidence of pulmonary embolism. The heart is not enlarged. There is no pericardial effusion. There are mild coronary artery calcifications and calcified atherosclerotic plaque of the thoracic aorta. Mediastinum/Nodes: The imaged thyroid is unremarkable. There is apparent thickening of the esophageal wall diffusely, though evaluation is degraded by under distension. There is no mediastinal, hilar, or axillary lymphadenopathy. Lungs/Pleura: The trachea and central airways are clear. There is consolidation in the lower lobes bilaterally, right worse than left. There is extensive patchy opacity throughout the remainder of the lungs, most prominent in the right upper lobe. There is no pleural effusion or pneumothorax. Upper Abdomen: The liver appears enlarged. No acute abnormality is seen in the upper abdomen. 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 There is no acute osseous abnormality or aggressive osseous lesion. Review of the MIP images confirms the above findings. IMPRESSION: 1. No evidence of pulmonary embolism. 2. Extensive opacities throughout both  lungs consistent with multifocal infection. Recommend follow-up radiographs or CT in 6-8 weeks to assess resolution. 3. Apparent thickening of the esophageal wall could reflect esophagitis, though note that evaluation is degraded by under distension. 4. Synovial osteochondromatosis of the left shoulder. 5. Hepatomegaly. Electronically Signed   By: Lesia Hausen M.D.   On: 10/19/2020 17:29   CT Cervical Spine Wo Contrast  Result Date: 10/17/2020 CLINICAL DATA:  Head trauma.  Recent cervical fusion yesterday. EXAM: CT HEAD WITHOUT CONTRAST CT CERVICAL SPINE WITHOUT CONTRAST TECHNIQUE: Multidetector CT imaging of the head and cervical spine was performed following the standard protocol without intravenous contrast. Multiplanar CT image reconstructions of the cervical spine were also generated. COMPARISON:  Cervical spine x-ray 10/16/2020. FINDINGS:  CT HEAD FINDINGS Brain: No evidence of acute infarction, hemorrhage, hydrocephalus, extra-axial collection or mass lesion/mass effect. Vascular: No hyperdense vessel or unexpected calcification. Skull: Normal. Negative for fracture or focal lesion. Sinuses/Orbits: No acute finding. Other: Posterior scalp laceration.  No foreign body. CT CERVICAL SPINE FINDINGS Alignment: Normal. Skull base and vertebrae: No acute fracture. No primary bone lesion or focal pathologic process. Anterior plate and vertebral body screws are seen at C2, C3, C4 and C5. Disc spaces are seen at the surgical levels. Soft tissues and spinal canal: There is some prevertebral soft tissue edema and air as well as air and edema in the right neck and upper mediastinum compatible with recent surgery. No obvious focal fluid collection identified. Central spinal canal soft tissues within normal limits. Disc levels: There is facet arthropathy at multiple levels. Disc space osteophytes are noted at multiple levels. There is disc space narrowing it C6-C7. There is severe neural foraminal stenosis on the left at  C4-C5 secondary to facet arthropathy and uncovertebral spurring. There is severe neural foraminal stenosis on the right at C3-C4 secondary to uncovertebral spurring. There is no significant central canal stenosis. Upper chest: Negative. Other: None. IMPRESSION: 1.  No acute intracranial process. 2. No acute fracture or traumatic subluxation of the cervical spine. 3. Anterior fusion C2-C5 with prevertebral swelling and air as well as edema and air in the right neck compatible with recent surgery. No fluid collection identified. Electronically Signed   By: Darliss Cheney M.D.   On: 10/17/2020 17:07   IR GASTROSTOMY TUBE MOD SED  Result Date: 11/08/2020 CLINICAL DATA:  Dysphagia, needs enteral feeding support EXAM: PERC PLACEMENT GASTROSTOMY FLUOROSCOPY TIME:  2 minute 42 seconds; 8 mGy TECHNIQUE: The procedure, risks, benefits, and alternatives were explained to the patient. Questions regarding the procedure were encouraged and answered. The patient understands and consents to the procedure. As antibiotic prophylaxis, cefazolin 2 g was ordered pre-procedure and administered intravenously within one hour of incision. A safe percutaneous approach was confirmed on recent CT. A 5 French angiographic catheter was placed as orogastric tube. The upper abdomen was prepped with Betadine, draped in usual sterile fashion, and infiltrated locally with 1% lidocaine. Intravenous Fentanyl and Versed 2mg  were administered as conscious sedation during continuous monitoring of the patient's level of consciousness and physiological / cardiorespiratory status by the radiology RN, with a total moderate sedation time of 11 minutes. 0.5 mg glucagon given IV.Stomach was insufflated using air through the orogastric tube. An 36 French sheath needle was advanced percutaneously into the gastric lumen under fluoroscopy. Gas could be aspirated and a small contrast injection confirmed intraluminal spread. The sheath was exchanged over a  guidewire for a 9 [place] vascular sheath, through which the snare device was advanced and used to snare a guidewire passed through the orogastric tube. This was withdrawn, and the snare attached to the 20 French pull-through gastrostomy tube, which was advanced antegrade, positioned with the internal bumper securing the anterior gastric wall to the anterior abdominal wall. Small contrast injection confirms appropriate positioning. The external bumper was applied and the catheter was flushed. COMPLICATIONS: COMPLICATIONS none IMPRESSION: 1. Technically successful 20 French pull-through gastrostomy placement under fluoroscopy. Electronically Signed   By: Corlis Leak M.D.   On: 11/08/2020 12:38   DG CHEST PORT 1 VIEW  Result Date: 10/27/2020 CLINICAL DATA:  Evaluate for pneumonia EXAM: PORTABLE CHEST 1 VIEW COMPARISON:  10/19/2020 FINDINGS: There is a feeding tube with tip well below the level of the  GE junction. Stable cardiomediastinal contours. Persistent airspace opacities identified within the right mid and lower lung and retrocardiac left lung base. Bilateral glenohumeral joint osteoarthritis is noted with multiple loose bodies in the left joint space. No acute osseous findings. IMPRESSION: Persistent bilateral pulmonary opacities compatible with multifocal infection. Electronically Signed   By: Signa Kell M.D.   On: 10/27/2020 15:04   DG CHEST PORT 1 VIEW  Result Date: 10/23/2020 CLINICAL DATA:  Hypoxemia EXAM: PORTABLE CHEST 1 VIEW COMPARISON:  10/19/2020 FINDINGS: Single frontal view of the chest demonstrates a stable cardiac silhouette. Enteric catheter passes below diaphragm tip excluded by collimation. There is progressive bibasilar airspace disease. No effusion or pneumothorax. 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 IMPRESSION: 1. Progressive bibasilar airspace disease consistent with multifocal pneumonia. Electronically Signed   By: Sharlet Salina M.D.   On:  10/23/2020 17:22   DG Chest Portable 1 View  Result Date: 10/19/2020 CLINICAL DATA:  Shortness of breath. EXAM: PORTABLE CHEST 1 VIEW COMPARISON:  10/17/2020 FINDINGS: Normal cardiomediastinal contours. Interval development of multifocal bilateral airspace opacities involving the right midlung right base and retrocardiac left lung base. No signs of pleural effusion or edema. Multiple loose bodies are again noted within the left glenohumeral joint. IMPRESSION: Interval development of multifocal bilateral airspace opacities compatible with multifocal infection. Electronically Signed   By: Signa Kell M.D.   On: 10/19/2020 14:30   DG Shoulder Left  Result Date: 10/17/2020 CLINICAL DATA:  Left shoulder pain after fall. EXAM: LEFT SHOULDER - 2+ VIEW COMPARISON:  Left shoulder x-rays dated November 04, 2019. FINDINGS: No acute fracture or dislocation. Progressive severe glenohumeral joint space narrowing, now with bone-on-bone apposition. Multiple calcified intra-articular bodies again noted. 4.5 cm lucent lesion in the subscapularis recess corresponds to a lipoma seen on prior MRI. Soft tissues are unremarkable. IMPRESSION: 1. No acute osseous abnormality. 2. 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 Electronically Signed   By: Obie Dredge M.D.   On: 10/17/2020 18:07   DG Abd Portable 1V  Result Date: 10/20/2020 CLINICAL DATA:  Evaluate feeding tube placement. EXAM: PORTABLE ABDOMEN - 1 VIEW COMPARISON:  05/25/2020 FINDINGS: Feeding tube tip is below the GE junction in the expected location of the body of stomach. Bilateral airspace opacities are identified within the imaged portions of the lungs. Most advanced within the right upper lobe. IMPRESSION: Feeding tube tip is in the body of the stomach. Electronically Signed   By: Signa Kell M.D.   On: 10/20/2020 11:43   DG Swallowing Func-Speech Pathology  Result Date: 11/02/2020 Table  formatting from the original result was not included. Objective Swallowing Evaluation: Type of Study: MBS-Modified Barium Swallow Study  Patient Details Name: Edward Garner Sites MRN: [id number] Date of Birth: [birth year] Today's Date: 11/02/2020 Time: SLP Start Time (ACUTE ONLY): 1030 -SLP Stop Time (ACUTE ONLY): 1130 SLP Time Calculation (min) (ACUTE ONLY): 60 min Past Medical History: Past Medical History: Diagnosis Date  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HCC)   Bipolar disorder (HCC)   Complication of anesthesia   Diabetes mellitus without complication (HCC)   Hyperlipidemia   Hypertension   Kidney disease   PONV (postoperative nausea and vomiting)   Sleep apnea   Sleep apnea, unspecified 10/19/2018  Spinal stenosis  Past Surgical History: Past Surgical History: Procedure Laterality Date  ANTERIOR CERVICAL DECOMP/DISCECTOMY FUSION N/A 10/16/2020  Procedure: ACDF - C3-C4 - C4-C5 - C5-C6;  Surgeon: Tia Alert, MD;  Location: [facility];  Service: Neurosurgery;  Laterality: N/A;  TONSILLECTOMY   HPI: Pt is a 55 y/o male who recently underwent cervical ACDF C3-C6 on 10/3 and discharged home on 10/4.  Patient presented to ER later that day after syncopal episode at home with negative CTH and cervical CT and required scalp laceration repair.  No other complaints, normal ddimer and hemodynamics and therefore discharged home. Pt returned to tje ER again for progressive lethargy at home and was found by EMS with O2 saturations in the 70's, placed on NRB and no improvement to narcan.  Additionally reported decrease oral intake due to ongoing pain and some difficulty swallowing, CT cervical showed slightly increased prevertebral swelling compared to 10/4 and narrowed AP diameter of the larynx the level of the epiglottis to 5 mm, CTH negative for acute intracranial process, and CTA PE was negative for PE, but showed multifocal pneumonia and thickening of esophageal wall possibly reflecting esophagitis, and hepatomegaly.  Subjective: alert  Assessment / Plan / Recommendation CHL IP CLINICAL IMPRESSIONS 11/02/2020 Clinical Impression Pt presents with marginally improved swallow function, with improvements related to current mental status, however mechanics of swallowing remain impaired with no obvious etiology. (Prevertebral edema is minimal; CT head negative for acute neuro event).  Pt demonstrates poor pharyngeal squeeze, reduced mobility of hyolaryngeal structures, incomplete epiglottic closure over larynx, and reduced traction on UES for opening. Sluggish movements of structures lead to significant residue throughout pharynx (solids > liquids) and aspiration of thin liquids, penetration of nectars, and potential of spillage of residue from solids.  Pt has good sensation and a strong cough in response to aspiration. He swallows multiple times with single teaspoon boluses in an effort to transfer material from pharynx through UES, but these sub-swallows are minimally effective and material sits in pharynx.  Severity of dysphagia will not allow Mr. Ayoub to meet his nutritional needs via oral intake.  Recommend proceeding with PEG.  Consider allowing nectar-thick liquids to allow opportunities for swallowing and to stave off further deterioration related to disuse atrophy.  Recommend f/u SLP for dysphagia treatment in inpatient rehab.  D/W with B. Boyette from CIR and pt's mother, Nicholes Calamity, who agrees with plan. SLP Visit Diagnosis Dysphagia, oropharyngeal phase (R13.12) Attention and concentration deficit following -- Frontal lobe and executive function deficit following -- Impact on safety and function Moderate aspiration risk   CHL IP TREATMENT RECOMMENDATION 11/02/2020 Treatment Recommendations Therapy as outlined in treatment plan below   Prognosis 11/02/2020 Prognosis for Safe Diet Advancement Fair Barriers to Reach Goals -- Barriers/Prognosis Comment -- CHL IP DIET RECOMMENDATION 11/02/2020 SLP Diet Recommendations Nectar thick liquid  Liquid Administration via Cup Medication Administration Via alternative means Compensations -- Postural Changes --   CHL IP OTHER RECOMMENDATIONS 11/02/2020 Recommended Consults -- Oral Care Recommendations Oral care QID Other Recommendations Order thickener from pharmacy   CHL IP FOLLOW UP RECOMMENDATIONS 11/02/2020 Follow up Recommendations Inpatient Rehab   CHL IP FREQUENCY AND DURATION 11/02/2020 Speech Therapy Frequency (ACUTE ONLY) min 2x/week Treatment Duration --      CHL IP ORAL PHASE 11/02/2020 Oral Phase WFL Oral - Pudding Teaspoon -- Oral - Pudding Cup -- Oral - Honey Teaspoon -- Oral - Honey Cup -- Oral - Nectar Teaspoon WFL Oral - Nectar Cup WFL Oral - Nectar Straw -- Oral - Thin Teaspoon WFL Oral - Thin Cup -- Oral - Thin Straw -- Oral - Puree -- Oral - Mech Soft -- Oral - Regular -- Oral - Multi-Consistency -- Oral - Pill -- Oral Phase -  Comment --  CHL IP PHARYNGEAL PHASE 11/02/2020 Pharyngeal Phase Impaired Pharyngeal- Pudding Teaspoon -- Pharyngeal -- Pharyngeal- Pudding Cup -- Pharyngeal -- Pharyngeal- Honey Teaspoon Delayed swallow initiation-pyriform sinuses;Reduced pharyngeal peristalsis;Reduced epiglottic inversion;Reduced anterior laryngeal mobility;Reduced airway/laryngeal closure;Penetration/Aspiration during swallow;Penetration/Apiration after swallow;Trace aspiration;Pharyngeal residue - valleculae;Pharyngeal residue - pyriform Pharyngeal Material enters airway, CONTACTS cords and not ejected out Pharyngeal- Honey Cup -- Pharyngeal -- Pharyngeal- Nectar Teaspoon Delayed swallow initiation-pyriform sinuses;Reduced pharyngeal peristalsis;Reduced epiglottic inversion;Reduced anterior laryngeal mobility;Reduced airway/laryngeal closure;Penetration/Aspiration during swallow;Penetration/Apiration after swallow;Trace aspiration;Pharyngeal residue - valleculae;Pharyngeal residue - pyriform Pharyngeal Material enters airway, remains ABOVE vocal cords and not ejected out Pharyngeal- Nectar Cup NT  Pharyngeal -- Pharyngeal- Nectar Straw Delayed swallow initiation-pyriform sinuses;Reduced pharyngeal peristalsis;Reduced epiglottic inversion;Reduced anterior laryngeal mobility;Reduced airway/laryngeal closure;Penetration/Aspiration during swallow;Penetration/Apiration after swallow;Trace aspiration;Pharyngeal residue - valleculae;Pharyngeal residue - pyriform Pharyngeal Material enters airway, remains ABOVE vocal cords and not ejected out Pharyngeal- Thin Teaspoon NT Pharyngeal -- Pharyngeal- Thin Cup -- Pharyngeal -- Pharyngeal- Thin Straw Delayed swallow initiation-pyriform sinuses;Reduced pharyngeal peristalsis;Reduced epiglottic inversion;Reduced anterior laryngeal mobility;Reduced airway/laryngeal closure;Penetration/Aspiration during swallow;Penetration/Apiration after swallow;Trace aspiration;Pharyngeal residue - valleculae;Pharyngeal residue - pyriform Pharyngeal Material enters airway, passes BELOW cords and not ejected out despite cough attempt by patient Pharyngeal- Puree Delayed swallow initiation-vallecula;Reduced pharyngeal peristalsis;Reduced epiglottic inversion;Reduced anterior laryngeal mobility;Reduced airway/laryngeal closure;Pharyngeal residue - valleculae;Pharyngeal residue - pyriform Pharyngeal -- Pharyngeal- Mechanical Soft -- Pharyngeal -- Pharyngeal- Regular -- Pharyngeal -- Pharyngeal- Multi-consistency -- Pharyngeal -- Pharyngeal- Pill -- Pharyngeal -- Pharyngeal Comment --  No flowsheet data found. Blenda Mounts Laurice 11/02/2020, 12:40 PM              DG Swallowing Func-Speech Pathology  Result Date: 10/26/2020 Table formatting from the original result was not included. Objective Swallowing Evaluation: Type of Study: MBS-Modified Barium Swallow Study  Patient Details Name: Edward Garner MRN: [id number] Date of Birth: 10-31-1965 Today's Date: 10/26/2020 Time: SLP Start Time (ACUTE ONLY): 1320 -SLP Stop Time (ACUTE ONLY): 1332 SLP Time Calculation (min) (ACUTE ONLY): 12 min  Past Medical History: Past Medical History: Diagnosis Date  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HCC)   Bipolar disorder (HCC)   Complication of anesthesia   Diabetes mellitus without complication (HCC)   Hyperlipidemia   Hypertension   Kidney disease   PONV (postoperative nausea and vomiting)   Sleep apnea   Sleep apnea, unspecified 10/19/2018  Spinal stenosis  Past Surgical History: Past Surgical History: Procedure Laterality Date  ANTERIOR CERVICAL DECOMP/DISCECTOMY FUSION N/A 10/16/2020  Procedure: ACDF - C3-C4 - C4-C5 - C5-C6;  Surgeon: Tia Alert, MD;  Location: [facility] OR;  Service: Neurosurgery;  Laterality: N/A;  TONSILLECTOMY   HPI: Pt is a 55 y/o male who recently underwent cervical ACDF C3-C6 on 10/3 and discharged home on 10/4.  Patient presented to ER later that day after syncopal episode at home with negative CTH and cervical CT and required scalp laceration repair.  No other complaints, normal ddimer and hemodynamics and therefore discharged home. Pt returned to tje ER again for progressive lethargy at home and was found by EMS with O2 saturations in the 70's, placed on NRB and no improvement to narcan.  Additionally reported decrease oral intake due to ongoing pain and some difficulty swallowing, CT cervical showed slightly increased prevertebral swelling compared to 10/4 and narrowed AP diameter of the larynx the level of the epiglottis to 5 mm, CTH negative for acute intracranial process, and CTA PE was negative for PE, but showed multifocal pneumonia and thickening of esophageal wall possibly reflecting esophagitis, and hepatomegaly.  No  data recorded Assessment / Plan / Recommendation CHL IP CLINICAL IMPRESSIONS 10/26/2020 Clinical Impression Pt was alert, but demonstrated difficulty attending to tasks and his implementation of compensatory strategies was variable; the impact of these on his performance is considered. Pt s/p ACDF 10/3 and hardward noted C3-C-6 with the prevetebral edema as noted on CT soft tissue. He  presented with oropharyngeal dysphagia characterized by reduced bolus cohesion, reduced pharyngeal constriction, a pharyngeal delay, reduced hyolaryngeal elavation and reduced anterior laryngeal movement. Some hyolaryngeal elevation was noted but anterior laryngeal movement was reduced and epiglottic inversion was limited, at least partly, due to edema. He was able to swallow very small portions of boluses with repeated swallows, but duration of cricopharyngeal relaxation was reduced. Moderate vallecular residue and pyriform sinus residue were  noted accross limited trials. Penetration (PAS 3, 5) was noted before deglutition and aspiration (PAS 7,8) noted during and after. Aspiration frequently resulted in coughing which was inconsistently effective propelling the aspirate superior to the vocal folds, but it was often ineffective in fully expelling penetrate/aspirate from the larynx, and a recurrence of aspiration was therefore demonstrated. With a cued effortful swallow and nectar thick liquids via cup, pt was once able to demonstrate improved laryngeal vestibule closure and increased airway protection. This could not be replicated, but this reduced the amount of laryngeal invasion and resulted in there only being laryngeal invasion after the swallow. Pt is at risk for aspiration before, during and after deglutition due to the pharyngeal delay, premature spillage, reduced airway protection, and pharyngeal residue. It is recommended that the NPO status be maitained with continued allowance of ice chips following oral care. SLP will continue to follow pt for treatment, but is anticipated that improvement in the pharyngeal edema will facilitate some increased epiglottic inversion and reduce aspiration risk. SLP Visit Diagnosis Dysphagia, oropharyngeal phase (R13.12) Attention and concentration deficit following -- Frontal lobe and executive function deficit following -- Impact on safety and function Moderate  aspiration risk   CHL IP TREATMENT RECOMMENDATION 10/26/2020 Treatment Recommendations Therapy as outlined in treatment plan below   Prognosis 10/26/2020 Prognosis for Safe Diet Advancement Good Barriers to Reach Goals Severity of deficits Barriers/Prognosis Comment -- CHL IP DIET RECOMMENDATION 10/26/2020 SLP Diet Recommendations NPO;Ice chips PRN after oral care Liquid Administration via -- Medication Administration Via alternative means Compensations -- Postural Changes Seated upright at 90 degrees   CHL IP OTHER RECOMMENDATIONS 10/26/2020 Recommended Consults -- Oral Care Recommendations Oral care QID Other Recommendations --   CHL IP FOLLOW UP RECOMMENDATIONS 10/26/2020 Follow up Recommendations None   CHL IP FREQUENCY AND DURATION 10/26/2020 Speech Therapy Frequency (ACUTE ONLY) min 2x/week Treatment Duration 2 weeks      CHL IP ORAL PHASE 10/26/2020 Oral Phase Impaired Oral - Pudding Teaspoon -- Oral - Pudding Cup -- Oral - Honey Teaspoon -- Oral - Honey Cup -- Oral - Nectar Teaspoon Decreased bolus cohesion;Premature spillage Oral - Nectar Cup Decreased bolus cohesion;Premature spillage Oral - Nectar Straw -- Oral - Thin Teaspoon Decreased bolus cohesion;Premature spillage Oral - Thin Cup -- Oral - Thin Straw -- Oral - Puree -- Oral - Mech Soft -- Oral - Regular -- Oral - Multi-Consistency -- Oral - Pill -- Oral Phase - Comment --  CHL IP PHARYNGEAL PHASE 10/26/2020 Pharyngeal Phase Impaired Pharyngeal- Pudding Teaspoon -- Pharyngeal -- Pharyngeal- Pudding Cup -- Pharyngeal -- Pharyngeal- Honey Teaspoon -- Pharyngeal -- Pharyngeal- Honey Cup -- Pharyngeal -- Pharyngeal- Nectar Teaspoon Penetration/Aspiration before swallow;Penetration/Aspiration during swallow;Penetration/Apiration after swallow;Trace aspiration;Pharyngeal residue -  valleculae;Pharyngeal residue - pyriform;Pharyngeal residue - posterior pharnyx;Reduced laryngeal elevation;Reduced airway/laryngeal closure;Reduced anterior laryngeal  mobility;Reduced epiglottic inversion Pharyngeal Material enters airway, remains ABOVE vocal cords and not ejected out;Material enters airway, CONTACTS cords and not ejected out;Material enters airway, passes BELOW cords and not ejected out despite cough attempt by patient Pharyngeal- Nectar Cup Penetration/Aspiration before swallow;Penetration/Aspiration during swallow;Penetration/Apiration after swallow;Trace aspiration;Pharyngeal residue - valleculae;Pharyngeal residue - pyriform;Pharyngeal residue - posterior pharnyx;Reduced laryngeal elevation;Reduced airway/laryngeal closure;Reduced anterior laryngeal mobility;Reduced epiglottic inversion;Moderate aspiration Pharyngeal Material enters airway, remains ABOVE vocal cords and not ejected out;Material enters airway, CONTACTS cords and not ejected out;Material enters airway, passes BELOW cords and not ejected out despite cough attempt by patient Pharyngeal- Nectar Straw -- Pharyngeal -- Pharyngeal- Thin Teaspoon Penetration/Aspiration before swallow;Penetration/Aspiration during swallow;Penetration/Apiration after swallow;Trace aspiration;Pharyngeal residue - valleculae;Pharyngeal residue - pyriform;Pharyngeal residue - posterior pharnyx;Reduced laryngeal elevation;Reduced airway/laryngeal closure;Reduced anterior laryngeal mobility;Reduced epiglottic inversion Pharyngeal Material enters airway, remains ABOVE vocal cords and not ejected out;Material enters airway, CONTACTS cords and not ejected out;Material enters airway, passes BELOW cords and not ejected out despite cough attempt by patient Pharyngeal- Thin Cup -- Pharyngeal -- Pharyngeal- Thin Straw -- Pharyngeal -- Pharyngeal- Puree -- Pharyngeal -- Pharyngeal- Mechanical Soft -- Pharyngeal -- Pharyngeal- Regular -- Pharyngeal -- Pharyngeal- Multi-consistency -- Pharyngeal -- Pharyngeal- Pill -- Pharyngeal -- Pharyngeal Comment --  Shanika I. Vear Clock, MS, CCC-SLP Acute Rehabilitation Services Office number  [phone] Pager [phone] Scheryl Marten 10/26/2020, 3:36 PM              DG C-Arm 1-60 Min-No Report  Result Date: 10/16/2020 Fluoroscopy was utilized by the requesting physician.  No radiographic interpretation.   DG C-Arm 1-60 Min-No Report  Result Date: 10/16/2020 Fluoroscopy was utilized by the requesting physician.  No radiographic interpretation.     Discharge Exam: Vitals:   11/10/20 0745 11/10/20 1231  BP: 119/79 (!) 97/55  Pulse:    Resp:  11  Temp: 97.7 F (36.5 C) (!) 97.5 F (36.4 C)  SpO2: 97% 97%   Vitals:   11/09/20 2326 11/10/20 0337 11/10/20 0745 11/10/20 1231  BP: (!) 125/51 (!) 123/91 119/79 (!) 97/55  Pulse: 88 88    Resp: 17   11  Temp: 97.7 F (36.5 C) 97.9 F (36.6 C) 97.7 F (36.5 C) (!) 97.5 F (36.4 C)  TempSrc: Oral Oral Oral Oral  SpO2: 97% 93% 97% 97%  Weight:      Height:        General: Pt is alert, awake, not in acute distress Cardiovascular: RRR, S1/S2 +, no rubs, no gallops Respiratory: CTA bilaterally, no wheezing, no rhonchi Abdominal: Soft, NT, ND, bowel sounds +, PEG tube in place. Extremities: no edema, no cyanosis    The results of significant diagnostics from this hospitalization (including imaging, microbiology, ancillary and laboratory) are listed below for reference.     Microbiology: No results found for this or any previous visit (from the past 240 hour(s)).   Labs: BNP (last 3 results) Recent Labs    10/19/20 1347  BNP 99.4   Basic Metabolic Panel: No results for input(s): NA, K, CL, CO2, GLUCOSE, BUN, CREATININE, CALCIUM, MG, PHOS in the last 168 hours. Liver Function Tests: No results for input(s): AST, ALT, ALKPHOS, BILITOT, PROT, ALBUMIN in the last 168 hours. No results for input(s): LIPASE, AMYLASE in the last 168 hours. No results for input(s): AMMONIA in the last 168 hours. CBC: No results for input(s): WBC, NEUTROABS, HGB, HCT, MCV, PLT in the last  168 hours. Cardiac Enzymes: No  results for input(s): CKTOTAL, CKMB, CKMBINDEX, TROPONINI in the last 168 hours. BNP: Invalid input(s): POCBNP CBG: Recent Labs  Lab 11/09/20 2000 11/09/20 2323 11/10/20 0338 11/10/20 0744 11/10/20 1232  GLUCAP 157* 175* 152* 197* 189*   D-Dimer No results for input(s): DDIMER in the last 72 hours. Hgb A1c No results for input(s): HGBA1C in the last 72 hours. Lipid Profile No results for input(s): CHOL, HDL, LDLCALC, TRIG, CHOLHDL, LDLDIRECT in the last 72 hours. Thyroid function studies No results for input(s): TSH, T4TOTAL, T3FREE, THYROIDAB in the last 72 hours.  Invalid input(s): FREET3 Anemia work up No results for input(s): VITAMINB12, FOLATE, FERRITIN, TIBC, IRON, RETICCTPCT in the last 72 hours. Urinalysis    Component Value Date/Time   COLORURINE YELLOW 11/01/2020 1840   APPEARANCEUR HAZY (A) 11/01/2020 1840   LABSPEC 1.018 11/01/2020 1840   PHURINE 7.0 11/01/2020 1840   GLUCOSEU 50 (A) 11/01/2020 1840   HGBUR NEGATIVE 11/01/2020 1840   BILIRUBINUR NEGATIVE 11/01/2020 1840   KETONESUR NEGATIVE 11/01/2020 1840   PROTEINUR NEGATIVE 11/01/2020 1840   NITRITE NEGATIVE 11/01/2020 1840   LEUKOCYTESUR NEGATIVE 11/01/2020 1840   Sepsis Labs Invalid input(s): PROCALCITONIN,  WBC,  LACTICIDVEN Microbiology No results found for this or any previous visit (from the past 240 hour(s)).   Time coordinating discharge: Over 30 minutes  SIGNED:   Hughie Closs, MD  [facility] 11/10/2020, 12:43 PM  If 7PM-7AM, please contact night-coverage [email or web address]

## 2020-11-10 NOTE — Progress Notes (Signed)
Inpatient Rehabilitation Admission Medication Review by a Pharmacist  A complete drug regimen review was completed for this patient to identify any potential clinically significant medication issues.  High Risk Drug Classes Is patient taking? Indication by Medication  Antipsychotic Yes Olanzapine for bipolar disorder vs schizoaffective disorder  Anticoagulant Yes Enoxaparin for VTE prophylaxis  Antibiotic No   Opioid Yes Norco prn for pain  Antiplatelet No   Hypoglycemics/insulin Yes Insulin glargine and aspart for glucose control  Vasoactive Medication No   Chemotherapy No   Other Yes Bupropion, fluoxetine and valproic acid for mood stabilization Rosuvastatin: cholesterol Famotidine: GERD MVI: supplement Docusate, miralax and Senokot: laxatives     Type of Medication Issue Identified Description of Issue Recommendation(s)  Drug Interaction(s) (clinically significant)     Duplicate Therapy  Norco and Percocet both ordered prn for moderate pain. Percocet order has been discontinued.  Allergy     No Medication Administration End Date     Incorrect Dose     Additional Drug Therapy Needed  Insulin glargine, sliding scale insulin and tube feeding coverage not continued. Orders had been changed since prior transfer orders pended. Psychiatry had recommended Thiamine 200 mg daily, which had not been ordered. Continue lower dose of Insulin glargine, which was to begin 10/28 pm. Continue SSI and tube feeding coverage. Begin Thiamine 200 mg per tube daily on 11/11/20.  Significant med changes from prior encounter (inform family/care partners about these prior to discharge). Bupropion XL changed to non-sustained-release formulation per tube. Depakote ER changed to liquid form tube tube. Losartan and Metoprolol discontinued due to hypotension. Also off prior Prazosin.  Holding Zolpidem, Adderall and Vyvanse per Pyschiatry. MD/PA to discuss current meds with patient/family.   Other   Lorazepam 0.5 mg BID not continued.   Dc summary mentions plan to resume  Empagliflozin, glipizide XL, metformin, Pregabalin, Restasis, Trulicity, Percocet-10 prn, methocarbamol prn Holding Lorazepam for now per D. Angiulli. PA-C  Continue to hold oral hypoglycemics. Off pregabalin and to remain on hold. Trulicity not stocked at [facility] > resume after discharge. Percocet changed to Norco prn. May order methocarbamol if necessary, as well as Restasis.     Clinically significant medication issues were identified that warrant physician communication and completion of prescribed/recommended actions by midnight of the next day:  Yes  Name of provider notified for urgent issues identified:  Deatra Ina, PA-C  Provider Method of Notification: phone call  Pharmacist comments: Insulin resumed, Thiamine to begin.  Flomax cannot be given per tube, so holding for now per discussion with D. Angiulli, PA-C.  Time spent performing this drug regimen review (minutes):  [address]   Nicolette Bang [place], [place] 11/10/2020 4:03 PM

## 2020-11-11 DIAGNOSIS — F411 Generalized anxiety disorder: Secondary | ICD-10-CM

## 2020-11-11 DIAGNOSIS — F3131 Bipolar disorder, current episode depressed, mild: Secondary | ICD-10-CM

## 2020-11-11 DIAGNOSIS — F3189 Other bipolar disorder: Secondary | ICD-10-CM

## 2020-11-11 DIAGNOSIS — R5381 Other malaise: Principal | ICD-10-CM

## 2020-11-11 LAB — GLUCOSE, CAPILLARY
Glucose-Capillary: 113 mg/dL — ABNORMAL HIGH (ref 70–99)
Glucose-Capillary: 115 mg/dL — ABNORMAL HIGH (ref 70–99)
Glucose-Capillary: 124 mg/dL — ABNORMAL HIGH (ref 70–99)
Glucose-Capillary: 167 mg/dL — ABNORMAL HIGH (ref 70–99)
Glucose-Capillary: 184 mg/dL — ABNORMAL HIGH (ref 70–99)

## 2020-11-11 MED ORDER — HALOPERIDOL LACTATE 2 MG/ML PO CONC
2.0000 mg | Freq: Every day | ORAL | Status: DC
  Administered 2020-11-12 – 2020-11-15 (×4): 2 mg
  Filled 2020-11-11 (×5): qty 1

## 2020-11-11 MED ORDER — HALOPERIDOL LACTATE 2 MG/ML PO CONC
2.0000 mg | Freq: Two times a day (BID) | ORAL | Status: DC | PRN
  Filled 2020-11-11: qty 1

## 2020-11-11 NOTE — Plan of Care (Signed)
  Problem: RH Swallowing Goal: LTG Patient will consume least restrictive diet using compensatory strategies with assistance (SLP) Description: LTG:  Patient will consume least restrictive diet using compensatory strategies with assistance (SLP) Flowsheets (Taken 11/11/2020 1614) LTG: Pt Patient will consume least restrictive diet using compensatory strategies with assistance of (SLP): Supervision Goal: LTG Patient will participate in dysphagia therapy to increase swallow function with assistance (SLP) Description: LTG:  Patient will participate in dysphagia therapy to increase swallow function with assistance (SLP) Flowsheets (Taken 11/11/2020 1614) LTG: Pt will participate in dysphagia therapy to increase swallow function with assistance of (SLP): Supervision Goal: LTG Pt will demonstrate functional change in swallow as evidenced by bedside/clinical objective assessment (SLP) Description: LTG: Patient will demonstrate functional change in swallow as evidenced by bedside/clinical objective assessment (SLP) Flowsheets (Taken 11/11/2020 1614) LTG: Patient will demonstrate functional change in swallow as evidenced by bedside/clinical objective assessment: Oropharyngeal swallow
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
## 2020-11-11 NOTE — Progress Notes (Signed)
PROGRESS NOTE   Subjective/Complaints:  Pt reports wants to eat- asking if able to- explained not yet- have to speak with SLP, but I go on their recommendations, and work together as a team on this.   Per OT_ no Abd binder yet to cover PEG. Is ordered.  ROS:  Pt denies SOB, abd pain, CP, N/V/C/D, and vision changes   Objective:   No results found. Recent Labs    11/10/20 1555  WBC 7.0  HGB 11.9*  HCT 35.6*  PLT 395   Recent Labs    11/10/20 1555  CREATININE 0.70    Intake/Output Summary (Last 24 hours) at 11/11/2020 1054 Last data filed at 11/10/2020 1846 Gross per 24 hour  Intake 0 ml  Output 18 ml  Net -18 ml        Physical Exam: Vital Signs Blood pressure 111/64, pulse 80, temperature 98.2 F (36.8 C), resp. rate 17, height 6' (1.829 m), weight 71.4 kg, SpO2 95 %.   General: awake, alert, appropriate, extremely pale- pt says is normal; NAD HENT: conjugate gaze; slightly pale conjunctiva, but not severe; oropharynx moist- wearing soft collar CV: regular rate; no JVD Pulmonary: CTA B/L; no W/R/R- good air movement GI: soft, NT, ND, (+)BS; (+) PEG in place Psychiatric: appropriate; ancious Neurological: Ox3 Ext: no edema Skin: intact  Assessment/Plan: 1. Functional deficits which require 3+ hours per day of interdisciplinary therapy in a comprehensive inpatient rehab setting. Physiatrist is providing close team supervision and 24 hour management of active medical problems listed below. Physiatrist and rehab team continue to assess barriers to discharge/monitor patient progress toward functional and medical goals  Care Tool:  Bathing              Bathing assist       Upper Body Dressing/Undressing Upper body dressing   What is the patient wearing?: Hospital gown only    Upper body assist Assist Level: Moderate Assistance - Patient 50 - 74%    Lower Body Dressing/Undressing Lower  body dressing            Lower body assist       Toileting Toileting    Toileting assist Assist for toileting: Moderate Assistance - Patient 50 - 74%     Transfers Chair/bed transfer  Transfers assist           Locomotion Ambulation   Ambulation assist              Walk 10 feet activity   Assist           Walk 50 feet activity   Assist           Walk 150 feet activity   Assist           Walk 10 feet on uneven surface  activity   Assist           Wheelchair     Assist               Wheelchair 50 feet with 2 turns activity    Assist            Wheelchair 150 feet activity  Assist          Blood pressure 111/64, pulse 80, temperature 98.2 F (36.8 C), resp. rate 17, height 6' (1.829 m), weight 71.4 kg, SpO2 95 %.  Medical Problem List and Plan: 1.  Debility/syncope/altered mental status secondary to acute hypoxic respiratory failure in the setting of multifocal pneumonia with history of OSA.  Antibiotic course of Unasyn completed.             -patient may shower but PEG site must be covered             -ELOS/Goals: 10-14 days             first day of evaluations- Continue CIR- PT, OT and SLP  2.  Antithrombotics: -DVT/anticoagulation:  Pharmaceutical: Lovenox.  CT angiogram of chest negative for pulmonary emboli             -antiplatelet therapy: N/A 3. Pain Management: Oxycodone as needed. D/c IV dilaudid.   10/29- denied pain this AM- con't regimen 4. Mood/bipolar/schizoaffective disorder: Prozac 40 mg daily, Wellbutrin 100 mg twice daily, valproic acid 500 mg 3 times daily, Ativan 0.5 mg every 8 hours as needed             -antipsychotic agents: Zyprexa 2.5 mg daily/5 mg nightly/2.5 mg twice daily as needed.  Will place order for psychiatry consult so psych may continue to follow  10/29- restarted Ativan 0- his home dose 5. Neuropsych: This patient is not capable of making decisions on his  own behalf. 6. Skin/Wound Care: Routine skin checks 7. Fluids/Electrolytes/Nutrition: Routine in and outs with follow-up chemistries 8.  Diabetes mellitus.  Hemoglobin A1c 8.0.  Presently on Semglee 20 units twice daily.  Patient on Jardiance 10 mg daily, Glucophage 2000 mg daily, Glucotrol 5 mg daily prior to admission as well as Trulicity 3 mg weekly.  Resume as needed  10/29- BG's slightly labile- low to mid 100s- con't regime and monitor for trend 9.  Dysphagia.  Currently with nasogastric tube feeds transitioning to PEG tube per interventional radiology completed 11/08/2020.  Follow-up speech therapy  10/29- is NPO except ice chips= per SLP 10.  Hypertension.  Cozaar 50 mg and Lopressor currently on hold due to BP being quite soft. Hyperlipidemia.  Crestor 11. Urinary retention: PVRs. Restart flomax once orthostasis improves. Appreciate nursing encouraging toileting.     LOS: 1 days A FACE TO FACE EVALUATION WAS PERFORMED  Edward Garner 11/11/2020, 10:54 AM

## 2020-11-11 NOTE — Consult Note (Signed)
[facility] Face-to-Face Psychiatry Consult   Reason for Consult:  anxiety and Bipolar  Referring Physician:  Faith Rogue, MD Patient Identification: Edward Garner MRN:  [id number] Principal Diagnosis: Debility Diagnosis:  Principal Problem:   Debility Active Problems:   Other bipolar disorder (HCC)   Generalized anxiety disorder   Mild bipolar I disorder, most recent episode depressed (HCC)   Total Time spent with patient: 1 hour  Subjective:   Edward Garner is a 55 y.o. male patient admitted with confusion.  HPI:  55 year old Caucasian male with past medical history significant for Anxiety, Bipolar depression, ADHD, cigarette use, OSA, hypertension, hyperlipidemia, diabetes type 2, seizure and cervical spondylosis/stenosis status post ACDF on 10/16/2020. Patient interviewed in the presence of his mother for which he gave permission. He states that he recently underwent cervical ACDF C3 C6 by Dr. [name] on 10/16/2020 and discharged home on 10/17/2020. But was admitted back to the hospital due to disorientation and confusion. Today, he is alert, awake and oriented to time,place, person and situation. He reports increased anxiety, excessive sleep, psychomotor retardation, low energy level but denies mood swings, psychosis, delusions and self harming thoughts. Patient and his mother is requesting for medication adjustment. He reports that his blood sugar has been out of control since he started taking Olanzapine and wants it discontinued. He states that he was receiving Adderall and Vyvanse from his outpatient psychiatrist but he is willing to talk to his outside provider about an adjustment due to increased anxiety.Patient denies alcohol and illicit drug use.  Past Psychiatric History: as above  Risk to Self:  denies Risk to Others:  denies Prior Inpatient Therapy:   Prior Outpatient Therapy:    Past Medical History:  Past Medical History:  Diagnosis Date   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HCC)     Bipolar disorder (HCC)    Complication of anesthesia    Diabetes mellitus without complication (HCC)    Hyperlipidemia    Hypertension    Kidney disease    PONV (postoperative nausea and vomiting)    Sleep apnea    Sleep apnea, unspecified 10/19/2018   Spinal stenosis     Past Surgical History:  Procedure Laterality Date   ANTERIOR CERVICAL DECOMP/DISCECTOMY FUSION N/A 10/16/2020   Procedure: ACDF - C3-C4 - C4-C5 - C5-C6;  Surgeon: Tia Alert, MD;  Location: [facility] OR;  Service: Neurosurgery;  Laterality: N/A;   IR GASTROSTOMY TUBE MOD SED  11/08/2020   TONSILLECTOMY     Family History:  Family History  Problem Relation Age of Onset   Non-Hodgkin's lymphoma Mother    Diabetes Father    Cancer Father    Thyroid disease Father    Hypertension Father    Hyperlipidemia Father    Bipolar disorder Father    Family Psychiatric  History:   Social History:  Social History   Substance and Sexual Activity  Alcohol Use Not Currently     Social History   Substance and Sexual Activity  Drug Use Never    Social History   Socioeconomic History   Marital status: Single    Spouse name: Not on file   Number of children: Not on file   Years of education: Not on file   Highest education level: Not on file  Occupational History   Not on file  Tobacco Use   Smoking status: Former    Packs/day: 0.50    Years: 0.50    Pack years: 0.25    Types: Cigarettes  Quit date: 30    Years since quitting: 26.8   Smokeless tobacco: Former    Types: Snuff  Vaping Use   Vaping Use: Never used  Substance and Sexual Activity   Alcohol use: Not Currently   Drug use: Never   Sexual activity: Not on file  Other Topics Concern   Not on file  Social History Narrative   Not on file   Social Determinants of Health   Financial Resource Strain: Not on file  Food Insecurity: Not on file  Transportation Needs: Not on file  Physical Activity: Not on file  Stress: Not on file  Social  Connections: Not on file   Additional Social History:    Allergies:   Allergies  Allergen Reactions   Gramineae Pollens     Labs:  Results for orders placed or performed during the hospital encounter of 11/10/20 (from the past 48 hour(s))  CBC     Status: Abnormal   Collection Time: 11/10/20  3:55 PM  Result Value Ref Range   WBC 7.0 4.0 - 10.5 K/uL   RBC 3.64 (L) 4.22 - 5.81 MIL/uL   Hemoglobin 11.9 (L) 13.0 - 17.0 g/dL   HCT 16.1 (L) 09.6 - 04.5 %   MCV 97.8 80.0 - 100.0 fL   MCH 32.7 26.0 - 34.0 pg   MCHC 33.4 30.0 - 36.0 g/dL   RDW 40.9 81.1 - 91.4 %   Platelets 395 150 - 400 K/uL   nRBC 0.0 0.0 - 0.2 %    Comment: Performed at [facility], [address], [place], [place] [address]  Creatinine, serum     Status: None   Collection Time: 11/10/20  3:55 PM  Result Value Ref Range   Creatinine, Ser 0.70 0.61 - 1.24 mg/dL   GFR, Estimated >62 >13 mL/min    Comment: (NOTE) Calculated using the CKD-EPI Creatinine Equation (2021) Performed at [facility], [address] [place], [place] [address]   Glucose, capillary     Status: None   Collection Time: 11/10/20  4:10 PM  Result Value Ref Range   Glucose-Capillary 73 70 - 99 mg/dL    Comment: Glucose reference range applies only to samples taken after fasting for at least 8 hours.  Glucose, capillary     Status: Abnormal   Collection Time: 11/10/20  9:45 PM  Result Value Ref Range   Glucose-Capillary 142 (H) 70 - 99 mg/dL    Comment: Glucose reference range applies only to samples taken after fasting for at least 8 hours.  Glucose, capillary     Status: Abnormal   Collection Time: 11/11/20 12:14 AM  Result Value Ref Range   Glucose-Capillary 184 (H) 70 - 99 mg/dL    Comment: Glucose reference range applies only to samples taken after fasting for at least 8 hours.  Glucose, capillary     Status: Abnormal   Collection Time: 11/11/20  4:12 AM  Result Value Ref Range   Glucose-Capillary 115 (H) 70 - 99  mg/dL    Comment: Glucose reference range applies only to samples taken after fasting for at least 8 hours.  Glucose, capillary     Status: Abnormal   Collection Time: 11/11/20 12:06 PM  Result Value Ref Range   Glucose-Capillary 167 (H) 70 - 99 mg/dL    Comment: Glucose reference range applies only to samples taken after fasting for at least 8 hours.    Current Facility-Administered Medications  Medication Dose Route Frequency  Provider Last Rate Last Admin   docusate (COLACE) 50 MG/5ML liquid 100 mg  100 mg Per Tube BID PRN Angiulli, Mcarthur Rossetti, PA-C       enoxaparin (LOVENOX) injection 40 mg  40 mg Subcutaneous Q24H AngiulliMcarthur Rossetti, PA-C   40 mg at 11/11/20 0753   famotidine (PEPCID) tablet 20 mg  20 mg Per Tube BID Charlton Amor, PA-C   20 mg at 11/11/20 0753   feeding supplement (GLUCERNA 1.2 CAL) liquid 474 mL  474 mL Per Tube TID WC & HS Charlton Amor, PA-C   474 mL at 11/11/20 1229   feeding supplement (PROSource TF) liquid 45 mL  45 mL Per Tube Daily Ranelle Oyster, MD   45 mL at 11/11/20 0753   FLUoxetine (PROZAC) 20 MG/5ML solution 40 mg  40 mg Per Tube Daily Charlton Amor, PA-C   40 mg at 11/11/20 8333   free water 185 mL  185 mL Per Tube Q8H AngiulliMcarthur Rossetti, PA-C   185 mL at 11/11/20 0522   haloperidol (HALDOL) 2 MG/ML solution 2 mg  2 mg Per Tube BID PRN Remberto Lienhard, MD       haloperidol (HALDOL) 2 MG/ML solution 2 mg  2 mg Per Tube QHS Navie Lamoreaux, MD       HYDROcodone-acetaminophen (NORCO/VICODIN) 5-325 MG per tablet 1-2 tablet  1-2 tablet Per Tube Q4H PRN Charlton Amor, PA-C   2 tablet at 11/11/20 8329   insulin aspart (novoLOG) injection 0-15 Units  0-15 Units Subcutaneous QID Charlton Amor, PA-C   3 Units at 11/11/20 1228   insulin aspart (novoLOG) injection 3 Units  3 Units Subcutaneous QID Charlton Amor, PA-C   3 Units at 11/11/20 1229   insulin glargine-yfgn (SEMGLEE) injection 10 Units  10 Units Subcutaneous BID Charlton Amor, PA-C   10 Units at 11/11/20 1916   ipratropium-albuterol (DUONEB) 0.5-2.5 (3) MG/3ML nebulizer solution 3 mL  3 mL Nebulization Q6H PRN Angiulli, Mcarthur Rossetti, PA-C       lip balm (CARMEX) ointment   Topical PRN Charlton Amor, PA-C       LORazepam (ATIVAN) tablet 0.5 mg  0.5 mg Oral BID PRN Lovorn, Aundra Millet, MD   0.5 mg at 11/11/20 0703   multivitamin with minerals tablet 1 tablet  1 tablet Per Tube Daily AngiulliMcarthur Rossetti, PA-C   1 tablet at 11/11/20 0751   polyethylene glycol (MIRALAX / GLYCOLAX) packet 17 g  17 g Per Tube Daily PRN Angiulli, Mcarthur Rossetti, PA-C       polyethylene glycol (MIRALAX / GLYCOLAX) packet 17 g  17 g Per Tube Daily Charlton Amor, PA-C   17 g at 11/11/20 0754   rosuvastatin (CRESTOR) tablet 20 mg  20 mg Per Tube QHS AngiulliMcarthur Rossetti, PA-C   20 mg at 11/10/20 2124   senna-docusate (Senokot-S) tablet 1 tablet  1 tablet Per Tube BID Charlton Amor, PA-C   1 tablet at 11/11/20 6060   thiamine tablet 200 mg  200 mg Per Tube Daily Charlton Amor, PA-C   200 mg at 11/11/20 0459   valproic acid (DEPAKENE) 250 MG/5ML solution 500 mg  500 mg Per Tube TID Charlton Amor, PA-C   500 mg at 11/11/20 9774    Musculoskeletal: Strength & Muscle Tone:  unable to stand Gait & Station: unable to stand Patient leans: N/A     Psychiatric Specialty Exam:  Presentation  General  Appearance: Appropriate for Environment  Eye Contact:Good  Speech:Clear and Coherent; Slow  Speech Volume:Decreased  Handedness:Right   Mood and Affect  Mood:Euthymic  Affect:Appropriate   Thought Process  Thought Processes:Coherent; Linear  Descriptions of Associations:Intact  Orientation:Full (Time, Place and Person)  Thought Content:Logical  History of Schizophrenia/Schizoaffective disorder:No data recorded Duration of Psychotic Symptoms:No data recorded Hallucinations:Hallucinations: None  Ideas of Reference:None  Suicidal Thoughts:Suicidal Thoughts:  No  Homicidal Thoughts:Homicidal Thoughts: No   Sensorium  Memory:Immediate Good; Recent Good; Remote Good  Judgment:Fair  Insight:Fair   Executive Functions  Concentration:Fair  Attention Span:Fair  Recall:Fair  Fund of Knowledge:Good  Language:Good   Psychomotor Activity  Psychomotor Activity:Psychomotor Activity: Decreased; Psychomotor Retardation   Assets  Assets:Communication Skills; Desire for Improvement; Social Support   Sleep  Sleep:Sleep: Fair   Physical Exam: Physical Exam ROS Blood pressure 111/64, pulse 80, temperature 98.2 F (36.8 C), resp. rate 17, height 6' (1.829 m), weight 71.4 kg, SpO2 95 %. Body mass index is 21.35 kg/m.  Treatment Plan Summary: 55 year old male with history of mental disorder and cervical spondylosis/stenosis status post ACDF. Patient was admitted due to disorientation/confusion but reporting increased anxiety, excessive sleep and uncontrolled blood sugar since he started taking Olanzapine.Today, he denies psychosis, delusions,self harming thoughts and requesting for medication adjustment.   Recommendations: -Discontinue Olanzapine due to uncontrolled sugar. -Discontinue Wellbutrin due to increased anxiety. -Continue Prozac for anxiety/depression and Depakene for Bipolar -Add Haloperidol prn for agitation, Haloperidol 2 mg at bedtime for mood -Refer patient to his outpatient psychiatrist for follow up care upon discharge   Disposition: No evidence of imminent risk to self or others at present.   Patient does not meet criteria for psychiatric inpatient admission. Supportive therapy provided about ongoing stressors. Psychiatric service signing off. Re-consult as needed  Thedore Mins, MD 11/11/2020 1:31 PM
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
## 2020-11-11 NOTE — Plan of Care (Signed)
  Problem: RH Balance Goal: LTG Patient will maintain dynamic standing with ADLs (OT) Description: LTG:  Patient will maintain dynamic standing balance with assist during activities of daily living (OT)  Flowsheets (Taken 11/11/2020 1233) LTG: Pt will maintain dynamic standing balance during ADLs with: Supervision/Verbal cueing   Problem: Sit to Stand Goal: LTG:  Patient will perform sit to stand in prep for activites of daily living with assistance level (OT) Description: LTG:  Patient will perform sit to stand in prep for activites of daily living with assistance level (OT) Flowsheets (Taken 11/11/2020 1233) LTG: PT will perform sit to stand in prep for activites of daily living with assistance level: Supervision/Verbal cueing   Problem: RH Bathing Goal: LTG Patient will bathe all body parts with assist levels (OT) Description: LTG: Patient will bathe all body parts with assist levels (OT) Flowsheets (Taken 11/11/2020 1233) LTG: Pt will perform bathing with assistance level/cueing: Supervision/Verbal cueing   Problem: RH Dressing Goal: LTG Patient will perform upper body dressing (OT) Description: LTG Patient will perform upper body dressing with assist, with/without cues (OT). Flowsheets (Taken 11/11/2020 1233) LTG: Pt will perform upper body dressing with assistance level of: Set up assist Goal: LTG Patient will perform lower body dressing w/assist (OT) Description: LTG: Patient will perform lower body dressing with assist, with/without cues in positioning using equipment (OT) Flowsheets (Taken 11/11/2020 1233) LTG: Pt will perform lower body dressing with assistance level of: (excluding Teds)  Supervision/Verbal cueing  Other (Comment)   Problem: RH Toileting Goal: LTG Patient will perform toileting task (3/3 steps) with assistance level (OT) Description: LTG: Patient will perform toileting task (3/3 steps) with assistance level (OT)  Flowsheets (Taken 11/11/2020 1233) LTG: Pt  will perform toileting task (3/3 steps) with assistance level: Supervision/Verbal cueing   Problem: RH Toilet Transfers Goal: LTG Patient will perform toilet transfers w/assist (OT) Description: LTG: Patient will perform toilet transfers with assist, with/without cues using equipment (OT) Flowsheets (Taken 11/11/2020 1233) LTG: Pt will perform toilet transfers with assistance level of: Supervision/Verbal cueing   Problem: RH Tub/Shower Transfers Goal: LTG Patient will perform tub/shower transfers w/assist (OT) Description: LTG: Patient will perform tub/shower transfers with assist, with/without cues using equipment (OT) Flowsheets (Taken 11/11/2020 1233) LTG: Pt will perform tub/shower stall transfers with assistance level of: Supervision/Verbal cueing

## 2020-11-12 LAB — GLUCOSE, CAPILLARY
Glucose-Capillary: 120 mg/dL — ABNORMAL HIGH (ref 70–99)
Glucose-Capillary: 125 mg/dL — ABNORMAL HIGH (ref 70–99)
Glucose-Capillary: 136 mg/dL — ABNORMAL HIGH (ref 70–99)
Glucose-Capillary: 183 mg/dL — ABNORMAL HIGH (ref 70–99)
Glucose-Capillary: 190 mg/dL — ABNORMAL HIGH (ref 70–99)
Glucose-Capillary: 203 mg/dL — ABNORMAL HIGH (ref 70–99)

## 2020-11-12 NOTE — Discharge Instructions (Addendum)
Inpatient Rehab Discharge Instructions  Edward Garner Discharge date and time: No discharge date for patient encounter.   Activities/Precautions/ Functional Status: Activity: As tolerated Diet: Glucerna 474 mL 3 times daily daily with meals and bedtime per tube/Free water 185 mL every 8 hours by tube   Wound Care: Routine skin checks Functional status:  ___ No restrictions     ___ Walk up steps independently ___ 24/7 supervision/assistance   ___ Walk up steps with assistance ___ Intermittent supervision/assistance  ___ Bathe/dress independently ___ Walk with walker     _x__ Bathe/dress with assistance ___ Walk Independently    ___ Shower independently ___ Walk with assistance    ___ Shower with assistance ___ No alcohol     ___ Return to work/school ________  Special Instructions: No driving smoking or alcohol  Abdominal binder to protect PEG tube   COMMUNITY REFERRALS UPON DISCHARGE:    Home Health:   PT  SP  RN                 Agency:[facility] Phone: [phone]    Medical Equipment/Items Ordered:GLUCERNA 1.2 ALONG WITH PROSOURCE                                                 Agency/Supplier:[facility]  [phone]   My questions have been answered and I understand these instructions. I will adhere to these goals and the provided educational materials after my discharge from the hospital.  Patient/Caregiver Signature _______________________________ Date __________  Clinician Signature _______________________________________ Date __________  Please bring this form and your medication list with you to all your follow-up doctor's appointments.

## 2020-11-12 NOTE — Progress Notes (Signed)
Pt has home CPAP.  

## 2020-11-13 DIAGNOSIS — R1312 Dysphagia, oropharyngeal phase: Secondary | ICD-10-CM

## 2020-11-13 DIAGNOSIS — F411 Generalized anxiety disorder: Secondary | ICD-10-CM

## 2020-11-13 DIAGNOSIS — I10 Essential (primary) hypertension: Secondary | ICD-10-CM

## 2020-11-13 DIAGNOSIS — F3131 Bipolar disorder, current episode depressed, mild: Secondary | ICD-10-CM

## 2020-11-13 LAB — COMPREHENSIVE METABOLIC PANEL
ALT: 20 U/L (ref 0–44)
AST: 32 U/L (ref 15–41)
Albumin: 2.5 g/dL — ABNORMAL LOW (ref 3.5–5.0)
Alkaline Phosphatase: 80 U/L (ref 38–126)
Anion gap: 6 (ref 5–15)
BUN: 17 mg/dL (ref 6–20)
CO2: 31 mmol/L (ref 22–32)
Calcium: 9.5 mg/dL (ref 8.9–10.3)
Chloride: 101 mmol/L (ref 98–111)
Creatinine, Ser: 0.88 mg/dL (ref 0.61–1.24)
GFR, Estimated: >60 mL/min (ref >60)
Glucose, Bld: 130 mg/dL — ABNORMAL HIGH (ref 70–99)
Potassium: 4.8 mmol/L (ref 3.5–5.1)
Sodium: 138 mmol/L (ref 135–145)
Total Bilirubin: 0.8 mg/dL (ref 0.3–1.2)
Total Protein: 6.5 g/dL (ref 6.5–8.1)

## 2020-11-13 LAB — CBC WITH DIFFERENTIAL/PLATELET
Abs Immature Granulocytes: 0.01 10*3/uL (ref 0.00–0.07)
Basophils Absolute: 0 10*3/uL (ref 0.0–0.1)
Basophils Relative: 1 %
Eosinophils Absolute: 0.2 10*3/uL (ref 0.0–0.5)
Eosinophils Relative: 3 %
HCT: 38 % — ABNORMAL LOW (ref 39.0–52.0)
Hemoglobin: 12.7 g/dL — ABNORMAL LOW (ref 13.0–17.0)
Immature Granulocytes: 0 %
Lymphocytes Relative: 47 %
Lymphs Abs: 2.8 10*3/uL (ref 0.7–4.0)
MCH: 32.2 pg (ref 26.0–34.0)
MCHC: 33.4 g/dL (ref 30.0–36.0)
MCV: 96.2 fL (ref 80.0–100.0)
Monocytes Absolute: 0.7 10*3/uL (ref 0.1–1.0)
Monocytes Relative: 12 %
Neutro Abs: 2.2 10*3/uL (ref 1.7–7.7)
Neutrophils Relative %: 37 %
Platelets: 297 10*3/uL (ref 150–400)
RBC: 3.95 MIL/uL — ABNORMAL LOW (ref 4.22–5.81)
RDW: 12.9 % (ref 11.5–15.5)
WBC: 5.9 10*3/uL (ref 4.0–10.5)
nRBC: 0 % (ref 0.0–0.2)

## 2020-11-13 LAB — GLUCOSE, CAPILLARY
Glucose-Capillary: 108 mg/dL — ABNORMAL HIGH (ref 70–99)
Glucose-Capillary: 132 mg/dL — ABNORMAL HIGH (ref 70–99)
Glucose-Capillary: 183 mg/dL — ABNORMAL HIGH (ref 70–99)
Glucose-Capillary: 207 mg/dL — ABNORMAL HIGH (ref 70–99)
Glucose-Capillary: 74 mg/dL (ref 70–99)

## 2020-11-13 MED ORDER — TIZANIDINE HCL 2 MG PO TABS
2.0000 mg | ORAL_TABLET | Freq: Every day | ORAL | Status: DC
  Administered 2020-11-13 – 2020-11-15 (×3): 2 mg via ORAL
  Filled 2020-11-13 (×3): qty 1

## 2020-11-13 NOTE — Progress Notes (Signed)
Inpatient Rehabilitation Center Individual Statement of Services  Patient Name:  Edward Garner  Date:  11/13/2020  Welcome to the Inpatient Rehabilitation Center.  Our goal is to provide you with an individualized program based on your diagnosis and situation, designed to meet your specific needs.  With this comprehensive rehabilitation program, you will be expected to participate in at least 3 hours of rehabilitation therapies Monday-Friday, with modified therapy programming on the weekends.  Your rehabilitation program will include the following services:  Physical Therapy (PT), Occupational Therapy (OT), Speech Therapy (ST), 24 hour per day rehabilitation nursing, Therapeutic Recreaction (TR), Care Coordinator, Rehabilitation Medicine, Nutrition Services, and Pharmacy Services  Weekly team conferences will be held on Wednesday to discuss your progress.  Your Inpatient Rehabilitation Care Coordinator will talk with you frequently to get your input and to update you on team discussions.  Team conferences with you and your family in attendance may also be held.  Expected length of stay: 7-10 days  Overall anticipated outcome: supervision with cues  Depending on your progress and recovery, your program may change. Your Inpatient Rehabilitation Care Coordinator will coordinate services and will keep you informed of any changes. Your Inpatient Rehabilitation Care Coordinator's name and contact numbers are listed  below.  The following services may also be recommended but are not provided by the Inpatient Rehabilitation Center:  Driving Evaluations Home Health Rehabiltiation Services Outpatient Rehabilitation Services    Arrangements will be made to provide these services after discharge if needed.  Arrangements include referral to agencies that provide these services.  Your insurance has been verified to be:  medicare & medicaid Your primary doctor is:  [name]  Pertinent information  will be shared with your doctor and your insurance company.  Inpatient Rehabilitation Care Coordinator:  Dossie Der, [place] [phone] or Luna Glasgow  Information discussed with and copy given to patient by: [name], 11/13/2020, 10:05 AM

## 2020-11-13 NOTE — Progress Notes (Signed)
Inpatient Rehabilitation  Patient information reviewed and entered into eRehab system by Marlena Barbato Jemmie Ledgerwood, OTR/L.   Information including medical coding, functional ability and quality indicators will be reviewed and updated through discharge.    

## 2020-11-13 NOTE — Progress Notes (Signed)
PROGRESS NOTE   Subjective/Complaints:  Had a lot of cervical discomfort last night. Thinks meds were weaned to fast. Anxious to eat. PEG uncomfortable  ROS: Patient denies fever, rash, sore throat, blurred vision, nausea, vomiting, diarrhea, cough, shortness of breath or chest pain,  headache, or mood change.    Objective:   No results found. Recent Labs    11/10/20 1555 11/13/20 0554  WBC 7.0 5.9  HGB 11.9* 12.7*  HCT 35.6* 38.0*  PLT 395 297   Recent Labs    11/10/20 1555 11/13/20 0554  NA  --  138  K  --  4.8  CL  --  101  CO2  --  31  GLUCOSE  --  130*  BUN  --  17  CREATININE 0.70 0.88  CALCIUM  --  9.5    Intake/Output Summary (Last 24 hours) at 11/13/2020 1243 Last data filed at 11/13/2020 0926 Gross per 24 hour  Intake 659 ml  Output --  Net 659 ml        Physical Exam: Vital Signs Blood pressure 126/83, pulse 79, temperature 97.9 F (36.6 C), resp. rate 16, height 6' (1.829 m), weight 71.2 kg, SpO2 93 %.   Constitutional: No distress . Vital signs reviewed. HEENT: NCAT, EOMI, oral membranes moist Neck: supple, surgical scar noted. Posterior neck tender, no spasms appreciated Cardiovascular: RRR without murmur. No JVD    Respiratory/Chest: CTA Bilaterally without wheezes or rales. Normal effort    GI/Abdomen: BS +, LUQ tender at PEG site. A little tight Ext: no clubbing, cyanosis, or edema Psych: pleasant and cooperative, sl anxious Neurological: Ox3, moves all 4's. Fair insight and awareness Skin: post-op wound CDI  Assessment/Plan: 1. Functional deficits which require 3+ hours per day of interdisciplinary therapy in a comprehensive inpatient rehab setting. Physiatrist is providing close team supervision and 24 hour management of active medical problems listed below. Physiatrist and rehab team continue to assess barriers to discharge/monitor patient progress toward functional and  medical goals  Care Tool:  Bathing    Body parts bathed by patient: Right arm, Left arm, Chest, Abdomen, Front perineal area, Buttocks, Right upper leg, Left upper leg, Right lower leg, Left lower leg, Face         Bathing assist Assist Level: Supervision/Verbal cueing     Upper Body Dressing/Undressing Upper body dressing   What is the patient wearing?: Pull over shirt    Upper body assist Assist Level: Independent    Lower Body Dressing/Undressing Lower body dressing      What is the patient wearing?: Pants, Underwear/pull up     Lower body assist Assist for lower body dressing: Supervision/Verbal cueing     Toileting Toileting    Toileting assist Assist for toileting: Supervision/Verbal cueing     Transfers Chair/bed transfer  Transfers assist     Chair/bed transfer assist level: Total Assistance - Patient < 25%     Locomotion Ambulation   Ambulation assist      Assist level: Contact Guard/Touching assist Assistive device: Walker-rolling Max distance: 150   Walk 10 feet activity   Assist     Assist level: Contact Guard/Touching assist Assistive device:  Walker-rolling   Walk 50 feet activity   Assist    Assist level: Contact Guard/Touching assist Assistive device: Walker-rolling    Walk 150 feet activity   Assist    Assist level: Contact Guard/Touching assist Assistive device: Walker-rolling    Walk 10 feet on uneven surface  activity   Assist Walk 10 feet on uneven surfaces activity did not occur: Safety/medical concerns         Wheelchair     Assist Is the patient using a wheelchair?: No             Wheelchair 50 feet with 2 turns activity    Assist            Wheelchair 150 feet activity     Assist          Blood pressure 126/83, pulse 79, temperature 97.9 F (36.6 C), resp. rate 16, height 6' (1.829 m), weight 71.2 kg, SpO2 93 %.  Medical Problem List and Plan: 1.   Debility/syncope/altered mental status secondary to acute hypoxic respiratory failure in the setting of multifocal pneumonia with history of OSA.  Antibiotic course of Unasyn completed.             -patient may shower but PEG site must be covered             -ELOS/Goals: 10-14 days             -Continue CIR therapies including PT, OT, and SLP   2.  Antithrombotics: -DVT/anticoagulation:  Pharmaceutical: Lovenox.  CT angiogram of chest negative for pulmonary emboli             -antiplatelet therapy: N/A 3. Pain Management: hydrocodone as needed.    10/31 add hs tizanidine for sleep, cervicalgia/spasm 4. Mood/bipolar/schizoaffective disorder: Prozac 40 mg daily, (Wellbutrin 100 mg twice daily) valproic acid 500 mg 3 times daily, Ativan 0.5 mg every 8 hours as needed             -antipsychotic agents: HS and prn haldol  -zyprexa started to hyperglycemia  10/29- restarted Ativan at- his home dose 5. Neuropsych: This patient is not capable of making decisions on his own behalf. 6. Skin/Wound Care: Routine skin checks 7. Fluids/Electrolytes/Nutrition: Routine in and outs with follow-up chemistries 8.  Diabetes mellitus.  Hemoglobin A1c 8.0.  Presently on Semglee 20 units twice daily.  Patient on Jardiance 10 mg daily, Glucophage 2000 mg daily, Glucotrol 5 mg daily prior to admission as well as Trulicity 3 mg weekly.  Resume as needed  10/31 cbg's improving off zyprexa---observe for now 9.  Dysphagia.  Currently with nasogastric tube feeds transitioning to PEG tube per interventional radiology completed 11/08/2020.  Follow-up speech therapy  10/31- is NPO except ice chips= per SLP 10.  Hypertension.  Cozaar 50 mg and Lopressor currently on hold due to BP being quite soft.--controlled Hyperlipidemia.  Crestor 11. Urinary retention: scheduled toileting, continent 12. OSA on CPAP. Mother concerned about intermittent apneic spells--   -sats and VSS  LOS: 3 days A FACE TO FACE EVALUATION WAS  PERFORMED  Ranelle Oyster 11/13/2020, 12:43 PM

## 2020-11-13 NOTE — Progress Notes (Signed)
Pt able to place himself on his home CPAP machine w/out difficulty.

## 2020-11-13 NOTE — Progress Notes (Signed)
Patient ID: Edward Garner, male   DOB: April 17, 1965, 55 y.o.   MRN: [id number] Met with the patient to introduce self and role of the nurse care manager. Discussed current situation and secondary risks including DM (A1C 8.0), HTN, HLD and medications. Patient is hopeful to eat; discussed PEG care and meds/tube feeds per PEG.  Patient reports neck pain and constipation addressed. Was taking protein supplement PTA and able to manage DM.Continue to follow along to discharge and address educational needs to facilitate smooth transition to discharge home with mother. Margarito Liner

## 2020-11-13 NOTE — Progress Notes (Addendum)
Physical Therapy Session Note  Patient Details  Name: Edward Garner MRN: [id number] Date of Birth: [birth year]  Today's Date: 11/13/2020 PT Individual Time: 1300-1340; 6962-9528  PT Individual Time Calculation (min): 40 min and 38 min  Short Term Goals: Week 1:  PT Short Term Goal 1 (Week 1): =LTGs d/t ELOS  Skilled Therapeutic Interventions/Progress Updates:  Session 1 Pt received supine in bed asleep, easy to arouse. Reported 3/10 pain in peg tube and was premedicated. Offered positional changes and distraction throughout session for pain relief. Pt performed supine <>sit mod I and sit <>stand w/S* and no AD. Pt ambulated >150' w/CGA and no AD. Pt demonstrated toe out gait bilaterally, decreased step clearance bilaterally and lateral gait deviations (R > L). Pt ascended/descended 12 6" steps w/BUE support on rails, step-through pattern and S*. Pt ambulated 50' to mat w/CGA and performed the following at [facility] for improved UE strength:  -Bicep curls w/6# DBS, 3x14 per side  -Banded pull aparts w/blue tubing, emphasis on 3s isometric hold, 3x9  -Banded rows w/blue tubing, 3x15 per side   Sit <>stand from mat and pt ambulated >150' back to room w/CGA and no AD, demonstrated the same gait impairments listed above. Min verbal cues provided for toe out correction, but added distraction resulted in increased imbalance due to difficulty w/ selective attention so therapist halted concurrent cues. Pt requested to void and ambulated to toilet, voided continently and donned/doffed pants w/distant S* per pt request. Pt performed hand hygiene at sink w/S* and performed bed mobility mod I. Pt was left supine in bed, nursing notified to administer pain meds, all needs in reach.   Session 2 Pt received supine in bed, denied pain and was agreeable to PT. Emphasis of session on improved cardiovascular endurance and dynamic standing balance. Pt performed bed mobility mod I and sit <>stands throughout session  w/S*. Pt ambulated 100' to day room w/CGA and no AD, noted lateral gait deviations, wide base of support and bilateral toe out. Pt performed 15 minutes on NuStep level 5 for improved UE/LE coordination, cardiovascular endurance and dynamic warmup per pt request.   Ladder drills for improved LE coordination, dynamic balance and selective attention: -Pt performed mix of fwd, backward and lateral drills with 90% accuracy regarding leading foot without cues. Min visual and verbal cues throughout for correct sequencing. Pt demonstrated significant difficulty with balance when leading w/LLE compared to RLE.   Pt ambulated 100' back to room w/CGA and demonstrated the same impairments as listed above. Pt reported need to "wee wee" upon entering room and performed toilet transfer w/S* and voided continently. Pt ambulated to sink, washed hands and walked to bed w/S*. Pt performed sit <>supine mod I and was left supine in bed, all needs in reach.   Therapy Documentation Precautions:  Precautions Precautions: Fall Precaution Booklet Issued: No Precaution Comments: PEG Required Braces or Orthoses: Cervical Brace Cervical Brace: Soft collar, For comfort Restrictions Weight Bearing Restrictions: No   Therapy/Group: Individual Therapy Jill Alexanders Davian Hanshaw, PT, DPT  11/13/2020, 7:47 AM

## 2020-11-13 NOTE — Progress Notes (Signed)
Occupational Therapy Session Note  Patient Details  Name: Jaquaveon Bilal MRN: [id number] Date of Birth: 02/08/1965  Today's Date: 11/13/2020 OT Individual Time: 1100-1200 OT Individual Time Calculation (min): 60 min    Short Term Goals: Week 1:  OT Short Term Goal 1 (Week 1): STGs=LTGs due to ELOS  Skilled Therapeutic Interventions/Progress Updates:    Pt received in bed and we discussed all of the medical complications he has had to deal with "I almost wish I never had this surgery in the first place!"  Reminded pt that he is improving well and hopefully his swallowing will also progress quickly.   Pt requested to shower.  He was able to get out of bed, ambulate to dresser to pick out his clothing, walk to bathroom, toileted on regular toilet all with Supervision only.  When undressing to shower he tried to step out of his pants and had 1 minor LOB, and before I could even cue him he said "oh I really should be sitting to take these off over my feet". He then did so. He got into shower (IV site covered) and stood for part of the shower.  He dried off in sitting and then dressed.  The G tube gauze sponge replaced. Pt is supervision overall with no AD. I talked with pt that he really could be independent with his self care skills and since he is staying with his mom for the time being she can help with more of the homemaking tasks and be there to S him as he steps in and over tub wall when he showers.  Pt feels he is much stronger and has no symptoms of syncope. Pt opted to rest in bed with all needs met. Bed alarm set.   Therapy Documentation Precautions:  Precautions Precautions: Fall Precaution Booklet Issued: No Precaution Comments: PEG Required Braces or Orthoses: Cervical Brace Cervical Brace: Soft collar, For comfort Restrictions Weight Bearing Restrictions: No    Pain: Pain Assessment Pain Scale: 0-10 Pain Score: 2  Pain Type: Surgical pain Pain Location: Abdomen Pain  Intervention(s): Medication (See eMAR) ADL: ADL Eating: NPO Grooming: Setup Where Assessed-Grooming: Edge of bed Upper Body Bathing: Setup Where Assessed-Upper Body Bathing: Edge of bed Lower Body Bathing: Contact guard Where Assessed-Lower Body Bathing: Edge of bed Upper Body Dressing: Setup Where Assessed-Upper Body Dressing: Edge of bed Lower Body Dressing: Minimal assistance Where Assessed-Lower Body Dressing: Edge of bed Toileting: Contact guard Where Assessed-Toileting: Control and instrumentation engineer Transfer: Therapist, music Method: Ambulating (RW) Tub/Shower Transfer: Not assessed   Therapy/Group: Individual Therapy  Carlos 11/13/2020, 8:30 AM

## 2020-11-13 NOTE — Progress Notes (Signed)
Speech Language Pathology Daily Session Note  Patient Details  Name: Jameek Bruntz MRN: [id number] Date of Birth: [birth year]  Today's Date: 11/13/2020 SLP Individual Time: 3428-7681 SLP Individual Time Calculation (min): 29 min  Short Term Goals: Week 1: SLP Short Term Goal 1 (Week 1): STG=LTG due to ELOS  Skilled Therapeutic Interventions: Pt seen for skilled ST with focus on swallowing goals, pt's mother present throughout. Pt not wearing collar upon entry, placed independently. Pt continues to demonstrate decreased insight into current swallow impairments, benefits from ongoing education and training. SLP facilitating trials of NTL via cup providing min A cues to reduce rate of intake and bolus size. Pt demonstrates impulsive intake at times with poor self-monitoring. Pt reports this is patient's baseline behavior. Pt tolerating ~8oz NTL with 1 episode delayed cough following final swallow which patient tilted head back to drain cup. Pt demonstrate very audible swallow and 2-3 swallows per bolus across trials. Will benefit from [facility] later this week to further assess anatomy and physiology of swallow. Pt provided more oral care supplies and reinforced free water protocol for ice chips. Pt left in bed with alarm set and all needs within reach. Cont ST POC.  Pain Pain Assessment Pain Scale: 0-10 Pain Score: 0-No pain Pain Type: Acute pain Pain Location: Neck Pain Descriptors / Indicators: Aching Pain Frequency: Intermittent Pain Onset: Gradual Pain Intervention(s): Medication (See eMAR)  Therapy/Group: Individual Therapy  Tacey Ruiz 11/13/2020, 11:14 AM

## 2020-11-13 NOTE — IPOC Note (Signed)
Overall Plan of Care [facility]) Patient Details Name: Edward Garner MRN: [id number] DOB: Jan 22, 1965  Admitting Diagnosis: Debility  Hospital Problems: Principal Problem:   Debility Active Problems:   Other bipolar disorder (HCC)   Generalized anxiety disorder   Mild bipolar I disorder, most recent episode depressed (HCC)     Functional Problem List: Nursing Bladder, Bowel, Endurance, Pain, Nutrition, Medication Management, Safety, Skin Integrity  PT Balance, Safety, Sensory, Endurance, Motor, Nutrition, Pain  OT Balance, Endurance, Motor, Nutrition, Pain, Safety, Skin Integrity  SLP Nutrition  TR         Basic ADL's: OT Grooming, Bathing, Dressing, Toileting     Advanced  ADL's: OT Simple Meal Preparation     Transfers: PT Bed Mobility, Bed to Chair, Car, Occupational psychologist, Research scientist (life sciences): PT Ambulation, Stairs     Additional Impairments: OT None  SLP Swallowing, Social Cognition   Attention  TR      Anticipated Outcomes Item Anticipated Outcome  Self Feeding No goal  Swallowing  sup A   Basic self-care  Marketing executive Transfers Supervision  Bowel/Bladder  manage bowel w mod I and bladder with mod I assist  Transfers  supervision  Locomotion  supervision  Communication     Cognition     Pain  at or below level 4 w prn meds  Safety/Judgment  maintain w cues/reminders   Therapy Plan: PT Intensity: Minimum of 1-2 x/day ,45 to 90 minutes PT Frequency: 5 out of 7 days PT Duration Estimated Length of Stay: 7-10 days OT Intensity: Minimum of 1-2 x/day, 45 to 90 minutes OT Frequency: 5 out of 7 days OT Duration/Estimated Length of Stay: 7-10 days SLP Intensity: Minumum of 1-2 x/day, 30 to 90 minutes SLP Frequency: 3 to 5 out of 7 days SLP Duration/Estimated Length of Stay: ~7-10 days   Due to the current state of emergency, patients may not be receiving their 3-hours of Medicare-mandated  therapy.   Team Interventions: Nursing Interventions Disease Management/Prevention, Bladder Management, Medication Management, Discharge Planning, Pain Management, Bowel Management, Patient/Family Education, Skin Care/Wound Management  PT interventions Cognitive remediation/compensation, Ambulation/gait training, DME/adaptive equipment instruction, Discharge planning, Functional mobility training, Pain management, Psychosocial support, Splinting/orthotics, Therapeutic Activities, UE/LE Strength taining/ROM, Visual/perceptual remediation/compensation, Warden/ranger, Community reintegration, Disease management/prevention, Development worker, international aid stimulation, Neuromuscular re-education, Patient/family education, Skin care/wound management, Stair training, Therapeutic Exercise, UE/LE Coordination activities, Wheelchair propulsion/positioning  OT Interventions Warden/ranger, Fish farm manager, Patient/family education, Therapeutic Activities, Therapeutic Exercise, Psychosocial support, Community reintegration, Functional mobility training, Self Care/advanced ADL retraining, UE/LE Strength taining/ROM, UE/LE Coordination activities, Skin care/wound managment, Discharge planning, Disease mangement/prevention, Pain management  SLP Interventions Dysphagia/aspiration precaution training, Patient/family education, Cueing hierarchy  TR Interventions    SW/CM Interventions Psychosocial Support, Discharge Planning, Patient/Family Education   Barriers to Discharge MD  Medical stability  Nursing Home environment access/layout, Lack of/limited family support, Nutrition means, Neurogenic Bowel & Bladder apt w elevator solo vs home w mom x physical assist  PT Incontinence, Wound Care, Lack of/limited family support, Nutrition means    OT Nutrition means PEG  SLP      SW       Team Discharge Planning: Destination: PT-Home ,OT- Home , SLP-Home Projected Follow-up:  PT-Outpatient PT, OT-  Home health OT, SLP-Home Health SLP, Outpatient SLP Projected Equipment Needs: PT-Rolling walker with 5" wheels, OT- To be determined, SLP-To be determined Equipment Details: PT- , OT-  Patient/family involved in discharge  planning: PT- Patient,  OT-Patient, SLP-Patient  MD ELOS: 7-10 days Medical Rehab Prognosis:  Excellent Assessment: The patient has been admitted for CIR therapies with the diagnosis of debility after multiple medical issues. The team will be addressing functional mobility, strength, stamina, balance, safety, adaptive techniques and equipment, self-care, bowel and bladder mgt, patient and caregiver education, NMR, swallowing, community reentry. Goals have been set at supervision with most functional activities. Pt remains NPO at present, but I think there is potential for swallowing recovery.   Due to the current state of emergency, patients may not be receiving their 3 hours per day of Medicare-mandated therapy.    Ranelle Oyster, MD, FAAPMR     See Team Conference Notes for weekly updates to the plan of care
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
## 2020-11-14 LAB — GLUCOSE, CAPILLARY
Glucose-Capillary: 132 mg/dL — ABNORMAL HIGH (ref 70–99)
Glucose-Capillary: 138 mg/dL — ABNORMAL HIGH (ref 70–99)
Glucose-Capillary: 148 mg/dL — ABNORMAL HIGH (ref 70–99)
Glucose-Capillary: 215 mg/dL — ABNORMAL HIGH (ref 70–99)
Glucose-Capillary: 234 mg/dL — ABNORMAL HIGH (ref 70–99)
Glucose-Capillary: 89 mg/dL (ref 70–99)

## 2020-11-14 MED ORDER — LISDEXAMFETAMINE DIMESYLATE 50 MG PO CAPS
50.0000 mg | ORAL_CAPSULE | Freq: Every day | ORAL | Status: DC
  Filled 2020-11-14: qty 1

## 2020-11-14 NOTE — Patient Care Conference (Signed)
Inpatient RehabilitationTeam Conference and Plan of Care Update Date: 11/14/2020   Time: 12:05 PM    Patient Name: Edward Garner      Medical Record Number: [id number]  Date of Birth: March 06, 1965 Sex: Male         Room/Bed: 4W18C/4W18C-01 Payor Info: Payor: MEDICARE / Plan: MEDICARE PART A AND B / Product Type: *No Product type* /    Admit Date/Time:  11/10/2020  2:52 PM  Primary Diagnosis:  Debility  Hospital Problems: Principal Problem:   Debility Active Problems:   Other bipolar disorder (HCC)   Generalized anxiety disorder   Mild bipolar I disorder, most recent episode depressed [facility])    Expected Discharge Date: Expected Discharge Date: 11/18/20  Team Members Present: Physician leading conference: Dr. Faith Rogue Social Worker Present: Dossie Der, LCSW Nurse Present: Kennyth Arnold, RN PT Present: Ernestene Kiel, PT OT Present: Primitivo Gauze, OT SLP Present: Eilene Ghazi, SLP PPS Coordinator present : Fae Pippin, SLP     Current Status/Progress Goal Weekly Team Focus  Bowel/Bladder   Continent of bowel and bladder. per report LBM 10/31  Remain continent  Assess Qshift and prn   Swallow/Nutrition/ Hydration   NPO, mod A  sup A for swallow safety  anticipate MBS this week   ADL's   supervision  independent  ADL training, functional mobility, balance, pt education   Mobility   Mod I bed mobility, S* transfers, gait without AD >200'  S*  Dual tasking, selective attention, dynamic balance   Communication             Safety/Cognition/ Behavioral Observations  min A  sup A  compensatory memory aids, intellectual/emergent awareness of swallowing deficits and precautions   Pain   C/O pain on neck, 7/10  Pain <3/10  Assess Qshift and pRN   Skin   Peg site on left side of abdomen. Closed incision to right side of neck.  Maintain skin integrity.  Assess Qshift and PRN     Discharge Planning:  Going home with Mom who can provide supervision, will need  education on PEG tube if goes home with and feedings. Pain issues limit him   Team Discussion: Syncopal episodes at home. PE/pneumonia. Recent ACDF. Pain and behavior management. Psych saw last week. Vyvanse to start tomorrow. Hope to advance diet. Shoulder pain reported, has PRN's. Receiving bolus tube feeds. CPAP at HS.  Patient on target to meet rehab goals: yes, at supervision level. No mobility concerns. Gait deviation is baseline. Still NPO, receiving nectar thick, and ice chip trials. Does have peg for nutritional support.   *See Care Plan and progress notes for long and short-term goals.   Revisions to Treatment Plan:  Schedule MBS for Thursday. Vyvanse starting tomorrow   Teaching Needs: Family education, peg tube maintenance, medication administration, bolus tube feed administration, diabetes education  Current Barriers to Discharge: Decreased caregiver support, Lack of/limited family support, Behavior, and Nutritional means  Possible Resolutions to Barriers: Family education Pain management Peg tube education     Medical Summary Current Status: recent acdf, readmitted with syncope, PE/pneumonia. pain control, significant dysphagia. hx bipolar/schizoaffective disorder  Barriers to Discharge: Medical stability   Possible Resolutions to [employer] Focus: daily assessment of nutrition, pain, patient data, adjustments to care plan as indicated   Continued Need for Acute Rehabilitation Level of Care: The patient requires daily medical management by a physician with specialized training in physical medicine and rehabilitation for the following reasons: Direction of a multidisciplinary physical rehabilitation  program to maximize functional independence : Yes Medical management of patient stability for increased activity during participation in an intensive rehabilitation regime.: Yes Analysis of laboratory values and/or radiology reports with any subsequent need for  medication adjustment and/or medical intervention. : Yes   I attest that I was present, lead the team conference, and concur with the assessment and plan of the team.   Tennis Must 11/14/2020, 2:19 PM

## 2020-11-14 NOTE — Progress Notes (Signed)
Occupational Therapy Session Note  Patient Details  Name: Edward Garner MRN: [id number] Date of Birth: Sep 02, 1965  Today's Date: 11/14/2020 OT Individual Time: 2890-2284 OT Individual Time Calculation (min): 30 min    Short Term Goals: Week 1:  OT Short Term Goal 1 (Week 1): STGs=LTGs due to ELOS  Skilled Therapeutic Interventions/Progress Updates:    OT treatment session focused on dynamic balance and UB there-ex. Pt ambulated to dayroom without AD and min A. Pt with 1 overt LOB requiring Mod A to correct due to pt turning too fast and not paying attention to where his body was in space. Pt stood on foam block for balance challenge and completed 3 sets of 10 bicep curl and standing row. 3 sets of 5 mini squats while standing on foam and min A for balance. Pt ambulated back to room in similar fashion and returned to bed with supervision. Pt left semi-reclined in bed with mother present, call bell in reach, and needs met.   Therapy Documentation Precautions:  Precautions Precautions: Fall Precaution Booklet Issued: No Precaution Comments: PEG Required Braces or Orthoses: Cervical Brace Cervical Brace: Soft collar, For comfort Restrictions Weight Bearing Restrictions: No  Pain: Pain Assessment Pain Scale: 0-10 Pain Score: 0-No pain   Therapy/Group: Individual Therapy  Valma Cava 11/14/2020, 11:33 AM

## 2020-11-14 NOTE — Progress Notes (Signed)
Orthopedic Tech Progress Note Patient Details:  Edward Garner [birth year] [id number]  Ortho Devices Type of Ortho Device: Abdominal binder Ortho Device/Splint Location: abdominal binder, replacement binder delivered Ortho Device/Splint Interventions: Ordered   Post Interventions Patient Tolerated: Other (comment) (passed to therapy staff/dropped off in room)  Dropped off replacement abdominal binder in room to therapy staff.  Therapy staff to apply binder.   Thanks,  Corinna Capra, PT, DPT  Acute Rehabilitation Ortho Tech Supervisor [phone] pager #[phone] office     Lurena Joiner B Donathan Buller 11/14/2020, 9:05 AM

## 2020-11-14 NOTE — Progress Notes (Signed)
Speech Language Pathology Daily Session Note  Patient Details  Name: Edward Garner MRN: [id number] Date of Birth: 04/15/1965  Today's Date: 11/14/2020 SLP Individual Time: 0930-1000 SLP Individual Time Calculation (min): 30 min  Short Term Goals: Week 1: SLP Short Term Goal 1 (Week 1): STG=LTG due to ELOS  Skilled Therapeutic Interventions: Pt seen for skilled ST with focus on swallowing goals. Pt in bed having completed oral care already, SLP prompting pt to wear collar and HOB raised to 90 degrees for PO trials. Pt requesting NTL coffee, requiring min A cues to recall swallow strategies and mod A cues to utilize consistently during intake. Pt did demonstrate slight increase in frequency of s/s aspiration this date, mild weak throat clear following ~50% of trials. Swallow remains quite audible, pt required 2-3 swallows per sip throughout. Educated pt on upcoming MBS and continued trials until then, pt verbalizing understanding however insight into current swallow deficits remains impaired. Pt left in bed with alarm set and all needs within reach. Cont ST POC.   Pain Pain Assessment Pain Scale: 0-10 Pain Score: 0-No pain  Therapy/Group: Individual Therapy  Tacey Ruiz 11/14/2020, 10:00 AM

## 2020-11-14 NOTE — Progress Notes (Signed)
Occupational Therapy Session Note  Patient Details  Name: Edward Garner MRN: [id number] Date of Birth: 09/13/1965  Today's Date: 11/14/2020 OT Individual Time: 9278-0044 OT Individual Time Calculation (min): 30 min    Short Term Goals: Week 1:  OT Short Term Goal 1 (Week 1): STGs=LTGs due to ELOS  Skilled Therapeutic Interventions/Progress Updates:   Pt received in bed agreeable to therapy.   Pt declined a shower but agreeable to working on dynamic balance exercises.    Pt able to get out of bed, transfer to arm chair, ambulate without AD to bathroom, toilet all with S.   Dynamic balance exercises: -arm chair pushups then progressed to sit to stand with no UE support, focusing on eccentric control into sitting -standing at sink with heel raises, toe raises, single leg hip abduction and knee flexion for hamstring curls  Seated arm circles for shoulders strength.  Pt often talked about his bowflex machine he likes to use but encouraged him to NOT use it at home until he has had more time to heal from his ACDF.  Pt ambulated to recliner to rest until next therapy session.  Chair alarm on and all needs met.   Therapy Documentation Precautions:  Precautions Precautions: Fall Precaution Booklet Issued: No Precaution Comments: PEG Required Braces or Orthoses: Cervical Brace Cervical Brace: Soft collar, For comfort Restrictions Weight Bearing Restrictions: No   Pain: Pain Assessment Pain Scale: 0-10 Pain Score: 0-No pain ADL: ADL Eating: NPO Grooming: Setup Where Assessed-Grooming: Standing at sink Upper Body Bathing: Setup Where Assessed-Upper Body Bathing: Shower Lower Body Bathing: Supervision/safety Where Assessed-Lower Body Bathing: Shower Upper Body Dressing: Independent Where Assessed-Upper Body Dressing: Edge of bed Lower Body Dressing: Supervision/safety Where Assessed-Lower Body Dressing: Edge of bed Toileting: Supervision/safety Where  Assessed-Toileting: Glass blower/designer: Close supervision Toilet Transfer Method:  (no RW) Tub/Shower Transfer: Not assessed Social research officer, government: Close supervision Social research officer, government Method: Heritage manager: Shower seat with back   Therapy/Group: Individual Therapy  [place] 11/14/2020, 11:49 AM

## 2020-11-14 NOTE — Progress Notes (Signed)
PROGRESS NOTE   Subjective/Complaints:  Slept better last night. Feels that tizanidine was helpful. Asked if he could resume home vyvanse  ROS: Patient denies fever, rash, sore throat, blurred vision, nausea, vomiting, diarrhea, cough, shortness of breath or chest pain,   headache, or mood change.    Objective:   No results found. Recent Labs    11/13/20 0554  WBC 5.9  HGB 12.7*  HCT 38.0*  PLT 297   Recent Labs    11/13/20 0554  NA 138  K 4.8  CL 101  CO2 31  GLUCOSE 130*  BUN 17  CREATININE 0.88  CALCIUM 9.5    Intake/Output Summary (Last 24 hours) at 11/14/2020 1143 Last data filed at 11/13/2020 1700 Gross per 24 hour  Intake 0 ml  Output --  Net 0 ml        Physical Exam: Vital Signs Blood pressure 126/83, pulse 85, temperature 97.8 F (36.6 C), temperature source Oral, resp. rate 16, height 6' (1.829 m), weight 69.6 kg, SpO2 94 %.   Constitutional: No distress . Vital signs reviewed. HEENT: NCAT, EOMI, oral membranes moist Neck: supple Cardiovascular: RRR without murmur. No JVD    Respiratory/Chest: CTA Bilaterally without wheezes or rales. Normal effort    GI/Abdomen: BS +, non-tender, non-distended, PEG site clean Ext: no clubbing, cyanosis, or edema Psych: pleasant and cooperative, sl distracted Neurological: Ox3, moves all 4's. Fair insight and awareness Skin: post-op wound CDI Musc: neck with good rom, no spasms appreciated   Assessment/Plan: 1. Functional deficits which require 3+ hours per day of interdisciplinary therapy in a comprehensive inpatient rehab setting. Physiatrist is providing close team supervision and 24 hour management of active medical problems listed below. Physiatrist and rehab team continue to assess barriers to discharge/monitor patient progress toward functional and medical goals  Care Tool:  Bathing    Body parts bathed by patient: Right arm, Left arm,  Chest, Abdomen, Front perineal area, Buttocks, Right upper leg, Left upper leg, Right lower leg, Left lower leg, Face         Bathing assist Assist Level: Supervision/Verbal cueing     Upper Body Dressing/Undressing Upper body dressing   What is the patient wearing?: Pull over shirt    Upper body assist Assist Level: Independent    Lower Body Dressing/Undressing Lower body dressing      What is the patient wearing?: Pants, Underwear/pull up     Lower body assist Assist for lower body dressing: Supervision/Verbal cueing     Toileting Toileting    Toileting assist Assist for toileting: Supervision/Verbal cueing     Transfers Chair/bed transfer  Transfers assist     Chair/bed transfer assist level: Supervision/Verbal cueing     Locomotion Ambulation   Ambulation assist      Assist level: Contact Guard/Touching assist Assistive device: Walker-rolling Max distance: >150'   Walk 10 feet activity   Assist     Assist level: Contact Guard/Touching assist Assistive device: Walker-rolling   Walk 50 feet activity   Assist    Assist level: Contact Guard/Touching assist Assistive device: Walker-rolling    Walk 150 feet activity   Assist    Assist level:  Contact Guard/Touching assist Assistive device: Walker-rolling    Walk 10 feet on uneven surface  activity   Assist Walk 10 feet on uneven surfaces activity did not occur: Safety/medical concerns         Wheelchair     Assist Is the patient using a wheelchair?: No             Wheelchair 50 feet with 2 turns activity    Assist            Wheelchair 150 feet activity     Assist          Blood pressure 126/83, pulse 85, temperature 97.8 F (36.6 C), temperature source Oral, resp. rate 16, height 6' (1.829 m), weight 69.6 kg, SpO2 94 %.  Medical Problem List and Plan: 1.  Debility/syncope/altered mental status secondary to acute hypoxic respiratory failure in  the setting of multifocal pneumonia with history of OSA.  Antibiotic course of Unasyn completed.             -patient may shower but PEG site must be covered             -ELOS/Goals: 10-14 days            -Continue CIR therapies including PT, OT, and SLP. Interdisciplinary team conference today to discuss goals, barriers to discharge, and dc planning.     2.  Antithrombotics: -DVT/anticoagulation:  Pharmaceutical: Lovenox.  CT angiogram of chest negative for pulmonary emboli             -antiplatelet therapy: N/A 3. Pain Management: hydrocodone as needed.    11/1  added hs tizanidine for sleep, cervicalgia/spasm with improvement 4. Mood/bipolar/schizoaffective disorder: Prozac 40 mg daily, (Wellbutrin 100 mg twice daily) valproic acid 500 mg 3 times daily, Ativan 0.5 mg every 8 hours as needed             -antipsychotic agents: HS and prn haldol  -zyprexa started to hyperglycemia  10/29- restarted Ativan at- his home dose  11/1 pt requested to resume his home vyvanse---resumed 50mg  starting tomorrow. 5. Neuropsych: This patient is not capable of making decisions on his own behalf. 6. Skin/Wound Care: Routine skin checks 7. Fluids/Electrolytes/Nutrition: Routine in and outs with follow-up chemistries 8.  Diabetes mellitus.  Hemoglobin A1c 8.0.  Presently on Semglee 20 units twice daily.  Patient on Jardiance 10 mg daily, Glucophage 2000 mg daily, Glucotrol 5 mg daily prior to admission as well as Trulicity 3 mg weekly.  Resume as needed  10/31 cbg's improving off zyprexa---observe for now 9.  Dysphagia.  Currently with nasogastric tube feeds transitioning to PEG tube per interventional radiology completed 11/08/2020.  Follow-up speech therapy  10/31- is NPO except ice chips= adv per SLP 10.  Hypertension.  Cozaar 50 mg and Lopressor currently on hold due to BP being quite soft.--controlled 11/1 Hyperlipidemia.  Crestor 11. Urinary retention: scheduled toileting, continent 12. OSA on CPAP.  Mother concerned about intermittent apneic spells--   -continue to monitor sats and VSS  LOS: 4 days A FACE TO FACE EVALUATION WAS PERFORMED  13/1 11/14/2020, 11:43 AM

## 2020-11-14 NOTE — Plan of Care (Signed)
  Problem: RH Memory Goal: LTG Patient will use memory compensatory aids to (SLP) Description: LTG:  Patient will use memory compensatory aids to recall biographical/new, daily complex information with cues (SLP) Flowsheets (Taken 11/14/2020 1047) LTG: Patient will use memory compensatory aids to (SLP): Supervision   Problem: RH Awareness Goal: LTG: Patient will demonstrate awareness during functional activites type of (SLP) Description: LTG: Patient will demonstrate awareness during functional activites type of (SLP) Flowsheets (Taken 11/14/2020 1048) Patient will demonstrate during cognitive/linguistic activities awareness type of:  Intellectual  Emergent LTG: Patient will demonstrate awareness during cognitive/linguistic activities with assistance of (SLP): Supervision

## 2020-11-14 NOTE — Progress Notes (Signed)
Patient ID: Edward Garner, male   DOB: 05-27-1965, 55 y.o.   MRN: [id number]  Met with pt and spoke with Mom via telephone to discuss team conference goals of independent level and the main issue is his swallowing. Will have MBS on Thursday 11/3, this is the soonest can have this. Target discharge date is 11/5, even if on tube feeds can be educated on along with Mom. Mom is aware of this and will change her weekend plans to be available for son. Pt feels he an eat but aware needs to have MBS first before starts eating. Will work with on discharge needs.

## 2020-11-14 NOTE — Progress Notes (Signed)
Occupational Therapy Session Note  Patient Details  Name: Kailen Hinkle MRN: [id number] Date of Birth: [birth year]  Today's Date: 11/14/2020 OT Individual Time: 1320-1415 OT Individual Time Calculation (min): 55 min    Short Term Goals: Week 1:  OT Short Term Goal 1 (Week 1): STGs=LTGs due to ELOS  Skilled Therapeutic Interventions/Progress Updates:    Patient in bed, alert and agrees to starting therapy session early.  He denies pain.  Able to move to edge of bed with CS.  Max A to donn soft cervical collar and abdominal binder.   He requests to work on balance, coordination and endurance this session.  Ambulation to/from bed, arm chair, mat table, toilet and walking on unit without AD CS/CGA - no LOB.   Completed standing dynamic ball bounce/catch, side stepping with ball activity, high knees with CGA (rest breaks between activities).   Standing dynamic reach and reaction time activity with BITS system dots (size 5, 3 minutes) - trial 1:  1.32 sec, 87%, trial 2:  1.14 sec, 93%. Seated ergometer x 8 minutes, seated core exercises without difficulty.   Toileting completed with supervision. He returned to bed at close of session, bed alarm set, call bell in hand, provided ice pack to left shoulder as old rotator cuff injury causing soreness.    Therapy Documentation Precautions:  Precautions Precautions: Fall Precaution Booklet Issued: No Precaution Comments: PEG Required Braces or Orthoses: Cervical Brace Cervical Brace: Soft collar, For comfort Restrictions Weight Bearing Restrictions: No  Therapy/Group: Individual Therapy  Barrie Lyme 11/14/2020, 7:50 AM

## 2020-11-14 NOTE — Progress Notes (Signed)
Physical Therapy Session Note  Patient Details  Name: Edward Garner MRN: [id number] Date of Birth: [birth year]  Today's Date: 11/14/2020 PT Individual Time: 0800-0900 PT Individual Time Calculation (min): 60 min  and Today's Date: 11/14/2020 PT Missed Time: 15 Minutes Missed Time Reason: Nursing care  Short Term Goals: Week 1:  PT Short Term Goal 1 (Week 1): =LTGs d/t ELOS  Skilled Therapeutic Interventions/Progress Updates:  Pt received supine in bed, NT present to complete toileting. Pt reported 6/10 pain in cervical spine, requested pain meds prior to starting therapy, nursing notified. Offered positional changes and distraction for pain relied throughout session. Encouraged pt to participate without meds due to full therapy schedule, pt agreed. Donned Ted hose w/total A, noted no abdominal binder in room, nursing notified. Pt performed bed mobility w/mod I and sit <>stands throughout session w/S*. Pt ambulated 100' w/CGA and no AD, reported feeling like his knees were "pointing the wrong way" and that he "was walking like a drunk monkey". Provided min verbal cues to minimize bilateral toe out and point knees forward, pt unable to follow cues. Provided tactile cues for proper BLE alignment and muscle activation which further confused pt.   Pt completed FGA to assess fall risk and dynamic balance impairments w/CGA and no AD. Pt received a 13/30 on FGA (details below). Lengthy conversation regarding results interpretation and balance deficits. Pt verbalized understanding that his balance is impaired and reported he has had balance deficits his entire life. Educated pt on ways he can minimize risk of falls while walking and rearrange his home environment to minimize trip hazards, pt verbalized understanding.   Pt performed 5 minutes on NuStep level 7 per pt request for cardiovascular conditioning and UE/LE coordination. Pt' nurse available to pain med distribution and feeding, so pt ambulated  100' back to room w/CGA, noted continued gait impairments as listed above. Pt performed bed mobility mod I and was left supine in bed w/nursing. Missed 15 minutes of skilled PT due to nursing care.   Therapy Documentation Precautions:  Precautions Precautions: Fall Precaution Booklet Issued: No Precaution Comments: PEG Required Braces or Orthoses: Cervical Brace Cervical Brace: Soft collar, For comfort Restrictions Weight Bearing Restrictions: No  Balance: Standardized Balance Assessment Standardized Balance Assessment: Functional Gait Assessment Functional Gait  Assessment Gait assessed : Yes Gait Level Surface: Walks 20 ft in less than 7 sec but greater than 5.5 sec, uses assistive device, slower speed, mild gait deviations, or deviates 6-10 in outside of the 12 in walkway width. Change in Gait Speed: Able to change speed, demonstrates mild gait deviations, deviates 6-10 in outside of the 12 in walkway width, or no gait deviations, unable to achieve a major change in velocity, or uses a change in velocity, or uses an assistive device. Gait with Horizontal Head Turns: Performs head turns with moderate changes in gait velocity, slows down, deviates 10-15 in outside 12 in walkway width but recovers, can continue to walk. Gait with Vertical Head Turns: Performs task with slight change in gait velocity (eg, minor disruption to smooth gait path), deviates 6 - 10 in outside 12 in walkway width or uses assistive device Gait and Pivot Turn: Pivot turns safely in greater than 3 sec and stops with no loss of balance, or pivot turns safely within 3 sec and stops with mild imbalance, requires small steps to catch balance. Step Over Obstacle: Is able to step over one shoe box (4.5 in total height) without changing gait speed. No evidence of  imbalance. Gait with Narrow Base of Support: Ambulates 4-7 steps. Gait with Eyes Closed: Cannot walk 20 ft without assistance, severe gait deviations or imbalance,  deviates greater than 15 in outside 12 in walkway width or will not attempt task. Ambulating Backwards: Cannot walk 20 ft without assistance, severe gait deviations or imbalance, deviates greater than 15 in outside 12 in walkway width or will not attempt task. Steps: Alternating feet, must use rail. Total Score: 13 FGA comment:: No AD   Therapy/Group: Individual Therapy Edward Garner, PT, DPT  11/14/2020, 9:11 AM

## 2020-11-15 LAB — GLUCOSE, CAPILLARY
Glucose-Capillary: 120 mg/dL — ABNORMAL HIGH (ref 70–99)
Glucose-Capillary: 142 mg/dL — ABNORMAL HIGH (ref 70–99)
Glucose-Capillary: 147 mg/dL — ABNORMAL HIGH (ref 70–99)
Glucose-Capillary: 166 mg/dL — ABNORMAL HIGH (ref 70–99)
Glucose-Capillary: 246 mg/dL — ABNORMAL HIGH (ref 70–99)
Glucose-Capillary: 76 mg/dL (ref 70–99)

## 2020-11-15 NOTE — Progress Notes (Signed)
Occupational Therapy Session Note  Patient Details  Name: Edward Garner MRN: [id number] Date of Birth: [birth year]  Today's Date: 11/15/2020 OT Individual Time: 1300-1345 OT Individual Time Calculation (min): 45 min    Short Term Goals: Week 1:  OT Short Term Goal 1 (Week 1): STGs=LTGs due to ELOS  Skilled Therapeutic Interventions/Progress Updates:  Pt greeted supine in bed agreeable to OT intervention. Session focus on various therapeutic activities focused on higher level balance tasks and functional mobility in ADL apt. Pt completed functional mobility from room to gym with no AD and supervision. Pt completed higher level dynamic balance task with pt standing on airex cushion with pt able to reach out of Bos to retrieve bean bags and toss to target with supervision assist. Graded task up and had pt stand on balance board with BUE support on mat table to construct figure on peg board with supervision for balance, pt needed at least on UE supported on mat to complete task. Pt additionally able to ambulate around gym to collect ADL items from various surface heights to facilitate improved balance during IADLS, pt completed task with supervision. Education provided on fall prevention strategies when collecting items out of BOS with pt verbalizing understanding. Pt additionally completed functional mobility to ADL apt where pt completed functional transfers to flat bed, recliner and couch with supervision. Pt ambulated back to room with supervision where pts dad was present. Answered questions related to pts current level of assist and target goal level of independent- supervision.  pt left supine in bed with all needs within reach.                       Therapy Documentation Precautions:  Precautions Precautions: Fall Precaution Booklet Issued: No Precaution Comments: PEG Required Braces or Orthoses: Cervical Brace Cervical Brace: Soft collar, For comfort Restrictions Weight Bearing  Restrictions: No  Pain: pt reports no pain during session     Therapy/Group: Individual Therapy  Barron Schmid 11/15/2020, 3:40 PM

## 2020-11-15 NOTE — Progress Notes (Signed)
Physical Therapy Session Note  Patient Details  Name: Edward Garner MRN: [id number] Date of Birth: July 24, 1965  Today's Date: 11/15/2020 PT Individual Time: 1130-1200 PT Individual Time Calculation (min): 30 min   Short Term Goals: Week 1:  PT Short Term Goal 1 (Week 1): =LTGs d/t ELOS  Skilled Therapeutic Interventions/Progress Updates: Pt presents supine in bed and agreeable to therapy.  Pt transferstositting w/ mod I and then sit to stand w/supervision. Pt dons abd binder independently.  Pt amb > 150' w/o AD to small gym w/ supervision.  Pt states LEs are much stronger.  Pt performed standing toetaps to 6" platform w/ CGA and cueing for speed and safety.  Pt performed step-ups w/ knee lift alternating LEs and sidestep onto 6" platform from both directions.  Pt amb back to room and mod I for sit to supine.  Bed alarm on and all needs in reach.     Therapy Documentation Precautions:  Precautions Precautions: Fall Precaution Booklet Issued: No Precaution Comments: PEG Required Braces or Orthoses: Cervical Brace Cervical Brace: Soft collar, For comfort Restrictions Weight Bearing Restrictions: No General:   Vital Signs:   Pain:0/10 Pain Assessment Pain Scale: 0-10 Pain Score: 0-No pain     Therapy/Group: Individual Therapy  [name] 11/15/2020, 12:18 PM

## 2020-11-15 NOTE — Progress Notes (Signed)
Physical Therapy Session Note  Patient Details  Name: Edward Garner MRN: [id number] Date of Birth: 08-10-1965  Today's Date: 11/15/2020 PT Individual Time: 1015-1035 PT Individual Time Calculation (min): 20 min   Short Term Goals: Week 1:  PT Short Term Goal 1 (Week 1): =LTGs d/t ELOS  Skilled Therapeutic Interventions/Progress Updates: Pt presented in bed agreeable to therapy. Pt denies pain during session. Nsg was out of room upon PTA's arrival but had walked out to obtain additional meds, therefore pt missed 30 min of skilled PT due to med management. Once nsg was completed providing meds pt performed bed mobility with supervision and reqested to use bathroom. Pt ambulated to bathroom with close supervision and performed toileting 3/3 with supervision. Pt then donned soft collar with set up and PTA donned abdominal binder. Pt ambulated to day room without AD and CGA. Pt participated in NuStep L7 x 15 min cardiovascular endurance and general strengthening. Pt able to maintain ~40 SPM throughout time. Once completed pt ambulated back to room in same manner and returned to bed with supervision. Pt left in bed at end of session with bed alarm on, call bell within reach and needs met.      Therapy Documentation Precautions:  Precautions Precautions: Fall Precaution Booklet Issued: No Precaution Comments: PEG Required Braces or Orthoses: Cervical Brace Cervical Brace: Soft collar, For comfort Restrictions Weight Bearing Restrictions: No General: PT Amount of Missed Time (min): 30 Minutes PT Missed Treatment Reason: Nursing care Vital Signs: Therapy Vitals Temp: 97.8 F (36.6 C) Pulse Rate: 80 Resp: 18 BP: 121/74 Patient Position (if appropriate): Lying Oxygen Therapy SpO2: 98 % O2 Device: Room Air Pain: Pain Assessment Pain Score: 6  Mobility:   Locomotion :    Trunk/Postural Assessment :    Balance:   Exercises:   Other Treatments:      Therapy/Group:  Individual Therapy  Lamari Youngers 11/15/2020, 4:24 PM

## 2020-11-15 NOTE — Progress Notes (Signed)
Speech Language Pathology Daily Session Note  Patient Details  Name: Edward Garner MRN: [id number] Date of Birth: 1965/09/20  Today's Date: 11/15/2020 SLP Individual Time: 3536-1443 SLP Individual Time Calculation (min): 45 min  Short Term Goals: Week 1: SLP Short Term Goal 1 (Week 1): STG=LTG due to ELOS  Skilled Therapeutic Interventions: Pt seen for skilled ST with focus on swallowing and cognitive goals. Pt demonstrating increased understanding, carryover and utilization of swallow strategies, requiring Supervision A to utilize with trials this date. Pt tolerating 8 oz NTL via cup with no overt s/s aspiration, continues to require 2-3 swallows per bolus. SLP facilitating trials of magic cup with set up A for 1/2 tsp bolus size, again pt with no overt s/s aspiration across any trials. Pt educated on upcoming MBS 11/3 at 9am and discharge home 11/5. Pt states he's "going to cheat for the test by icing my neck to reduce any swelling". Will adjust goals of tx, POC and education/training following results of instrumental assessment tomorrow. Pt left in bed with alarm set and all needs within reach. Cont ST POC.   Pain Pain Assessment Pain Scale: 0-10 Pain Score: 0-No pain  Therapy/Group: Individual Therapy  Tacey Ruiz 11/15/2020, 9:20 AM

## 2020-11-15 NOTE — Progress Notes (Signed)
Removed PEG tube site from wound list. Placed in skin assessment as entry/exit site for tube placement.

## 2020-11-15 NOTE — Progress Notes (Signed)
Occupational Therapy Session Note  Patient Details  Name: Edward Garner MRN: [id number] Date of Birth: October 20, 1965  Today's Date: 11/15/2020 OT Individual Time: 2820-8138 OT Individual Time Calculation (min): 40 min    Short Term Goals: Week 1:  OT Short Term Goal 1 (Week 1): STGs=LTGs due to ELOS  Skilled Therapeutic Interventions/Progress Updates:    Pt received in bed requesting a shower. Covered his IV site on arm. Pt able to ambulate around room to gather his clothing, walk to bathroom with S. Toileted independently and then stepped to shower. Sat on bench for part of shower but could also stand.  Pt then dried off and dressed from edge of bench and then sat on EOB to put on socks and shoes.  Pt stood at sink to complete oral care and brush hair.   Pt then worked on dynamic balance exercise of standing at sink with heel raises, knee raises.  Decreased hip stability with knee raises as he shifts onto standing leg.  Stand to squats 10x with legs against back of bed.   Pt resting in bed with all needs met. Bed alarm set.   Therapy Documentation Precautions:  Precautions Precautions: Fall Precaution Booklet Issued: No Precaution Comments: PEG Required Braces or Orthoses: Cervical Brace Cervical Brace: Soft collar, For comfort Restrictions Weight Bearing Restrictions: No       Pain: Pt called RN for pain meds due to abd pain from G tube site ADL: ADL Eating: NPO Grooming: Independent Where Assessed-Grooming: Standing at sink Upper Body Bathing: Independent Where Assessed-Upper Body Bathing: Shower Lower Body Bathing: Supervision/safety Where Assessed-Lower Body Bathing: Shower Upper Body Dressing: Independent Where Assessed-Upper Body Dressing: Edge of bed Lower Body Dressing: Supervision/safety Where Assessed-Lower Body Dressing: Edge of bed Toileting: Independent Where Assessed-Toileting: Glass blower/designer: Close supervision Toilet Transfer Method:  (no  RW) Tub/Shower Transfer: Not assessed Social research officer, government: Close supervision Social research officer, government Method: Heritage manager: Shower seat with back   Therapy/Group: Individual Therapy  [place] 11/15/2020, 12:36 PM

## 2020-11-16 ENCOUNTER — Inpatient Hospital Stay (HOSPITAL_COMMUNITY): Payer: Medicare Other

## 2020-11-16 LAB — GLUCOSE, CAPILLARY
Glucose-Capillary: 159 mg/dL — ABNORMAL HIGH (ref 70–99)
Glucose-Capillary: 221 mg/dL — ABNORMAL HIGH (ref 70–99)
Glucose-Capillary: 120 mg/dL — ABNORMAL HIGH (ref 70–99)
Glucose-Capillary: 270 mg/dL — ABNORMAL HIGH (ref 70–99)
Glucose-Capillary: 307 mg/dL — ABNORMAL HIGH (ref 70–99)

## 2020-11-16 MED ORDER — TIZANIDINE HCL 2 MG PO TABS
2.0000 mg | ORAL_TABLET | Freq: Every day | ORAL | Status: DC
  Administered 2020-11-16 – 2020-11-17 (×2): 2 mg
  Filled 2020-11-16 (×2): qty 1

## 2020-11-16 MED ORDER — LORAZEPAM 0.5 MG PO TABS
0.5000 mg | ORAL_TABLET | Freq: Two times a day (BID) | ORAL | Status: DC | PRN
  Administered 2020-11-16 – 2020-11-17 (×3): 0.5 mg
  Filled 2020-11-16 (×5): qty 1

## 2020-11-16 MED ORDER — HALOPERIDOL LACTATE 2 MG/ML PO CONC
1.0000 mg | Freq: Every day | ORAL | Status: DC
  Administered 2020-11-16 – 2020-11-17 (×2): 1 mg
  Filled 2020-11-16 (×2): qty 0.5

## 2020-11-16 MED ORDER — LISDEXAMFETAMINE DIMESYLATE 30 MG PO CAPS
30.0000 mg | ORAL_CAPSULE | Freq: Every day | ORAL | Status: DC
  Administered 2020-11-16 – 2020-11-18 (×3): 30 mg via ORAL
  Filled 2020-11-16 (×3): qty 1

## 2020-11-16 MED ORDER — HALOPERIDOL LACTATE 2 MG/ML PO CONC
1.0000 mg | Freq: Two times a day (BID) | ORAL | Status: DC | PRN
  Filled 2020-11-16: qty 0.5

## 2020-11-16 MED ORDER — LISDEXAMFETAMINE DIMESYLATE 20 MG PO CAPS
20.0000 mg | ORAL_CAPSULE | Freq: Every day | ORAL | Status: DC
  Administered 2020-11-16 – 2020-11-18 (×3): 20 mg via ORAL
  Filled 2020-11-16 (×3): qty 1

## 2020-11-16 NOTE — Progress Notes (Addendum)
Inpatient Rehabilitation Discharge Medication Review by a Pharmacist  A complete drug regimen review was completed for this patient to identify any potential clinically significant medication issues.  High Risk Drug Classes Is patient taking? Indication by Medication  Antipsychotic Yes Haloperidol, fluoxetine, lisdexamfetamine, valproic acid for mood disorder  Anticoagulant No   Antibiotic No   Opioid Yes Vicodin for pain  Antiplatelet No   Hypoglycemics/insulin Yes Semglee for DM  Vasoactive Medication No   Chemotherapy No   Other No Lorazepam prn anxiety, Zanaflex for sleep/muscle spasms     Type of Medication Issue Identified Description of Issue Recommendation(s)  Drug Interaction(s) (clinically significant)     Duplicate Therapy     Allergy     No Medication Administration End Date     Incorrect Dose     Additional Drug Therapy Needed     Significant med changes from prior encounter (inform family/care partners about these prior to discharge).    Other       Clinically significant medication issues were identified that warrant physician communication and completion of prescribed/recommended actions by midnight of the next day:  No  Pharmacist comments: New insulin start - po DM meds stopped/on Trulicity prior to admission, on Flomax PTA-no per tube formulation available  Time spent performing this drug regimen review (minutes):  20 minutes   Edward Garner 11/16/2020 9:16 AM

## 2020-11-16 NOTE — Progress Notes (Signed)
Modified Barium Swallow Progress Note  Patient Details  Name: Edward Garner MRN: [id number] Date of Birth: Jun 12, 1965  Today's Date: 11/16/2020  Modified Barium Swallow completed.  Full report located under Chart Review in the Imaging Section.  Brief recommendations include the following:  Clinical Impression  Patient presents with mild improvement in swallow function as compared to previous two MBS's (10/13, 10/20) with most significant improvement being improved airway protection. He exhibited only one instance of trace aspiration (PAS 8)during the swallow with thin liquids and one instance of penetration without clearance of trace amount of nectar thick liquids (PAS 3). He was able to clear penetrate with cued throat clear and reswallow. Patient continues to exhibit limited laryngeal elevation and hyolaryngeal movement during swallow. With puree solids and nectar thick liquids, swallow was initiated at level of vallecular sinus and with thin liquids, swallow was initiated at level of pyriform sinus. After initial swallow of puree solids, patient had mild-moderate amount of bolus residuals in vallecular and pyriform sinus. With subsequent swallows, small amount of bolus transited through UES and into esophagus each time. Amount of pharyngeal residuals decreased but not eliminated with thin liquid consistency barium. SLP is recommending to start patient on Clear liquids (thin consistency) diet, continue meds via PEG and anticipate will require majority of nutrition still via PEG as well. Patient has been instructed to sit upright during PO's, swallow 2-3 extra times each sip, take small sips, clear throat and reswallow intermittently and to consume small amounts overall during each PO intake event.   Swallow Evaluation Recommendations       SLP Diet Recommendations: Thin liquid;Other (Comment) (clear liquids/thin only)   Liquid Administration via: Cup;Straw   Medication Administration:  Via alternative means   Supervision: Patient able to self feed;Intermittent supervision to cue for compensatory strategies   Compensations: Slow rate;Small sips/bites;Minimize environmental distractions;Clear throat intermittently;Multiple dry swallows after each bite/sip   Postural Changes: Seated upright at 90 degrees   Oral Care Recommendations: Oral care BID       Angela Nevin, MA, CCC-SLP Speech Therapy

## 2020-11-16 NOTE — Discharge Summary (Signed)
Physician Discharge Summary  Patient ID: Edward Garner MRN: [id number] DOB/AGE: 53-Apr-1967 53 y.o.  Admit date: 11/10/2020 Discharge date: 11/18/2020  Discharge Diagnoses:  Principal Problem:   Debility Active Problems:   Other bipolar disorder (HCC)   Generalized anxiety disorder   Mild bipolar I disorder, most recent episode depressed (HCC) DVT prophylaxis Diabetes mellitus Dysphagia Hypertension Hyperlipidemia OSA/tobacco use  Discharged Condition: Stable  Significant Diagnostic Studies: CT ABDOMEN WO CONTRAST  Result Date: 11/03/2020 CLINICAL DATA:  Dysphagia.  Possible G-tube placement. EXAM: CT ABDOMEN WITHOUT CONTRAST TECHNIQUE: Multidetector CT imaging of the abdomen was performed following the standard protocol without IV contrast. COMPARISON:  FL swallow study, 11/02/2020. Chest radiograph, 11/01/2020. CTA PE 10/19/2020. CT abdomen pelvis, 10/01/2019. FINDINGS: Lower chest: Dependent bibasilar consolidative opacities, additional radiodensities at the LEFT lung base-likely aspirated. (See key image) Hepatobiliary: Incidental prominent LEFT hepatic lobe. No focal liver abnormality is seen. No gallstones, gallbladder wall thickening, or biliary dilatation. Pancreas: Normal noncontrast appearance. No pancreatic ductal dilatation or surrounding inflammatory changes. Spleen: Normal in size without focal abnormality. Adrenals/Urinary Tract: Adrenal glands are unremarkable. Absent LEFT kidney. RIGHT renal calculi, largest measuring 0.4 cm. No hydronephrosis. Stomach/Bowel: Enteric feeding tube, with tip at the pylorus. Nonobstructed small bowel. Imaged portions of colon are nondilated. Retained contrast within colon, likely from recent administration. Vascular/Lymphatic: Aortic atherosclerosis. No enlarged abdominal lymph nodes. Other: No abdominal wall hernia or abnormality. Musculoskeletal: No acute osseous abnormality. IMPRESSION: 1. Bibasilar aspiration pneumonia, including  retained contrast within the dependent LEFT lung. 2. Prominent LEFT hepatic lobe and high-riding small-bowel loops at the LEFT upper quadrant. Findings are equivocal for feasibility of percutaneous gastrostomy placement, though an attempt with adequate gastric insufflation could be warranted. Roanna Banning, MD [facility] Electronically Signed   By: Roanna Banning M.D.   On: 11/03/2020 11:33   DG Chest 2 View  Result Date: 11/01/2020 CLINICAL DATA:  Altered mental status and cough EXAM: CHEST - 2 VIEW COMPARISON:  Radiograph 10/27/2020 FINDINGS: Unchanged cardiomediastinal silhouette. There is a nasogastric tube which courses below the diaphragm, tip excluded by collimation. Increased bilateral airspace opacities in the mid to lower lungs. No large pleural effusion or visible pneumothorax. No acute osseous abnormality. Unchanged bilateral shoulder osteoarthritis with multiple large osteochondral joint bodies along the left shoulder. IMPRESSION: Increased bilateral airspace disease in the mid to lower lungs compatible with worsening multifocal pneumonia. Electronically Signed   By: Caprice Renshaw M.D.   On: 11/01/2020 14:00   DG Cervical Spine 1 View  Result Date: 11/01/2020 CLINICAL DATA:  Altered mental status and cough, neck pain EXAM: DG CERVICAL SPINE - 1 VIEW COMPARISON:  Cervical spine CT 10/17/2020 FINDINGS: There is a feeding catheter coursing down the esophagus. Postsurgical changes of C3-6 ACDF. There is severe disc height loss at C6-C7. There is mild to moderate multilevel facet arthropathy. Hardware is intact without evidence of loosening. There is mild residual prevertebral soft tissue swelling. IMPRESSION: C3-C6 ACDF without evidence of hardware complication. Mild residual prevertebral soft tissue swelling. Unchanged severe disc height loss at C6-C7 and mild-to-moderate multilevel facet arthropathy. Electronically Signed   By: Caprice Renshaw  M.D.   On: 11/01/2020 13:58   DG Cervical Spine 1 View  Result Date: 10/24/2020 CLINICAL DATA:  Status post cervical fusion EXAM: DG CERVICAL SPINE - 1 VIEW COMPARISON:  10/16/2020 intraoperative radiographs, 09/27/2020 cervical radiographs FINDINGS: The cervical spine is imaged from the skull base through the superior aspect of C6. Redemonstrated ACDF C3-C6.  No acute fracture. Alignment is unremarkable. No significant prevertebral edema. Gastric tube is noted overlying the pharynx and esophagus. IMPRESSION: Status post ACDF C3-C6.  No acute abnormality. Electronically Signed   By: Wiliam Ke M.D.   On: 10/24/2020 11:48   CT HEAD WO CONTRAST ( )  Result Date: 10/19/2020 CLINICAL DATA:  Head trauma, altered mental status, recent surgery EXAM: CT HEAD WITHOUT CONTRAST TECHNIQUE: Contiguous axial images were obtained from the base of the skull through the vertex without intravenous contrast. COMPARISON:  10/17/2020. FINDINGS: Brain: No evidence of acute infarction, hemorrhage, cerebral edema, mass, mass effect, or midline shift. Ventricles and sulci are within normal limits for age. No extra-axial fluid collection. Vascular: No hyperdense vessel or unexpected calcification. Skull: Normal. Negative for fracture or focal lesion. Sinuses/Orbits: No acute finding. Other: The mastoid air cells are well aerated. IMPRESSION: No acute intracranial process. Electronically Signed   By: Wiliam Ke M.D.   On: 10/19/2020 17:15   CT Soft Tissue Neck W Contrast  Result Date: 10/19/2020 CLINICAL DATA:  Trouble swallowing, recent neuro surgery EXAM: CT NECK WITH CONTRAST TECHNIQUE: Multidetector CT imaging of the neck was performed using the standard protocol following the bolus administration of intravenous contrast. CONTRAST:  75mL OMNIPAQUE IOHEXOL 350 MG/ML SOLN COMPARISON:  No prior neck CT, correlation is made with CT cervical spine 10/17/2020 FINDINGS: Pharynx and larynx: Significant prevertebral swelling,  measuring up to 1.6 cm, previously 1.4 cm when remeasured similarly. Small amounts of air are seen within the edematous soft tissues, compatible with recent surgery. This narrows the larynx and effaces the piriform recesses, reducing the AP diameter of the larynx at the level of the epiglottis to 5 mm. No focal fluid collection within the parapharyngeal, retropharyngeal, or prevertebral soft tissues. Salivary glands: No inflammation, mass, or stone. Thyroid: Normal. Lymph nodes: None enlarged or abnormal density. Vascular: Atherosclerotic calcifications at the right greater than left carotid bifurcations. Aortic atherosclerotic calcifications. Otherwise negative. Limited intracranial: Negative. Visualized orbits: Status post bilateral lens replacements. Mastoids and visualized paranasal sinuses: Minimal mucosal thickening in the right maxillary sinus. The mastoids are well aerated. Skeleton: Status post C3-C6 ACDF.  No acute osseous abnormality. Upper chest: For findings in the thorax, please see same day CT chest. Other: Small amount of air seen at the anterior right aspect of the thyroid cartilage, likely postoperative, with decreased subcutaneous air overall compared to the prior exam. IMPRESSION: 1. Slightly increased prevertebral swelling compared to 10/17/2020, with decreased soft tissue gas, consistent with recent ACDF. This narrows the AP diameter of the larynx the level of the epiglottis to 5 mm. No focal collection within the parapharyngeal, retropharyngeal, or prevertebral soft tissues. 2.  For findings in the thorax, please see same day CT chest. Electronically Signed   By: Wiliam Ke M.D.   On: 10/19/2020 17:29   CT Angio Chest PE W and/or Wo Contrast  Result Date: 10/19/2020 CLINICAL DATA:  Recent surgery, hypoxia EXAM: CT ANGIOGRAPHY CHEST WITH CONTRAST TECHNIQUE: Multidetector CT imaging of the chest was performed using the standard protocol during bolus administration of intravenous contrast.  Multiplanar CT image reconstructions and MIPs were obtained to evaluate the vascular anatomy. CONTRAST:  75mL OMNIPAQUE IOHEXOL 350 MG/ML SOLN COMPARISON:  Same day chest radiograph FINDINGS: Cardiovascular: There is adequate opacification of the pulmonary arteries to the segmental level. There is no evidence of pulmonary embolism. The heart is not enlarged. There is no pericardial effusion. There are mild coronary artery calcifications and calcified atherosclerotic plaque of the  thoracic aorta. Mediastinum/Nodes: The imaged thyroid is unremarkable. There is apparent thickening of the esophageal wall diffusely, though evaluation is degraded by under distension. There is no mediastinal, hilar, or axillary lymphadenopathy. Lungs/Pleura: The trachea and central airways are clear. There is consolidation in the lower lobes bilaterally, right worse than left. There is extensive patchy opacity throughout the remainder of the lungs, most prominent in the right upper lobe. There is no pleural effusion or pneumothorax. Upper Abdomen: The liver appears enlarged. No acute abnormality is seen in the upper abdomen. 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 There is no acute osseous abnormality or aggressive osseous lesion. Review of the MIP images confirms the above findings. IMPRESSION: 1. No evidence of pulmonary embolism. 2. Extensive opacities throughout both lungs consistent with multifocal infection. Recommend follow-up radiographs or CT in 6-8 weeks to assess resolution. 3. Apparent thickening of the esophageal wall could reflect esophagitis, though note that evaluation is degraded by under distension. 4. Synovial osteochondromatosis of the left shoulder. 5. Hepatomegaly. Electronically Signed   By: Lesia Hausen M.D.   On: 10/19/2020 17:29   IR GASTROSTOMY TUBE MOD SED  Result Date: 11/08/2020 CLINICAL DATA:  Dysphagia, needs enteral feeding support EXAM: PERC PLACEMENT GASTROSTOMY  FLUOROSCOPY TIME:  2 minute 42 seconds; 8 mGy TECHNIQUE: The procedure, risks, benefits, and alternatives were explained to the patient. Questions regarding the procedure were encouraged and answered. The patient understands and consents to the procedure. As antibiotic prophylaxis, cefazolin 2 g was ordered pre-procedure and administered intravenously within one hour of incision. A safe percutaneous approach was confirmed on recent CT. A 5 French angiographic catheter was placed as orogastric tube. The upper abdomen was prepped with Betadine, draped in usual sterile fashion, and infiltrated locally with 1% lidocaine. Intravenous Fentanyl and Versed 2mg  were administered as conscious sedation during continuous monitoring of the patient's level of consciousness and physiological / cardiorespiratory status by the radiology RN, with a total moderate sedation time of 11 minutes. 0.5 mg glucagon given IV.Stomach was insufflated using air through the orogastric tube. An 48 French sheath needle was advanced percutaneously into the gastric lumen under fluoroscopy. Gas could be aspirated and a small contrast injection confirmed intraluminal spread. The sheath was exchanged over a guidewire for a 9 [place] vascular sheath, through which the snare device was advanced and used to snare a guidewire passed through the orogastric tube. This was withdrawn, and the snare attached to the 20 French pull-through gastrostomy tube, which was advanced antegrade, positioned with the internal bumper securing the anterior gastric wall to the anterior abdominal wall. Small contrast injection confirms appropriate positioning. The external bumper was applied and the catheter was flushed. COMPLICATIONS: COMPLICATIONS none IMPRESSION: 1. Technically successful 20 French pull-through gastrostomy placement under fluoroscopy. Electronically Signed   By: Corlis Leak M.D.   On: 11/08/2020 12:38   DG CHEST PORT 1 VIEW  Result Date:  10/27/2020 CLINICAL DATA:  Evaluate for pneumonia EXAM: PORTABLE CHEST 1 VIEW COMPARISON:  10/19/2020 FINDINGS: There is a feeding tube with tip well below the level of the GE junction. Stable cardiomediastinal contours. Persistent airspace opacities identified within the right mid and lower lung and retrocardiac left lung base. Bilateral glenohumeral joint osteoarthritis is noted with multiple loose bodies in the left joint space. No acute osseous findings. IMPRESSION: Persistent bilateral pulmonary opacities compatible with multifocal infection. Electronically Signed   By: Signa Kell M.D.   On: 10/27/2020 15:04   DG CHEST PORT 1 VIEW  Result Date: 10/23/2020 CLINICAL DATA:  Hypoxemia EXAM: PORTABLE CHEST 1 VIEW COMPARISON:  10/19/2020 FINDINGS: Single frontal view of the chest demonstrates a stable cardiac silhouette. Enteric catheter passes below diaphragm tip excluded by collimation. There is progressive bibasilar airspace disease. No effusion or pneumothorax. 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 IMPRESSION: 1. Progressive bibasilar airspace disease consistent with multifocal pneumonia. Electronically Signed   By: Sharlet Salina M.D.   On: 10/23/2020 17:22   DG Chest Portable 1 View  Result Date: 10/19/2020 CLINICAL DATA:  Shortness of breath. EXAM: PORTABLE CHEST 1 VIEW COMPARISON:  10/17/2020 FINDINGS: Normal cardiomediastinal contours. Interval development of multifocal bilateral airspace opacities involving the right midlung right base and retrocardiac left lung base. No signs of pleural effusion or edema. Multiple loose bodies are again noted within the left glenohumeral joint. IMPRESSION: Interval development of multifocal bilateral airspace opacities compatible with multifocal infection. Electronically Signed   By: Signa Kell M.D.   On: 10/19/2020 14:30   DG Abd Portable 1V  Result Date: 10/20/2020 CLINICAL DATA:  Evaluate feeding tube placement.  EXAM: PORTABLE ABDOMEN - 1 VIEW COMPARISON:  05/25/2020 FINDINGS: Feeding tube tip is below the GE junction in the expected location of the body of stomach. Bilateral airspace opacities are identified within the imaged portions of the lungs. Most advanced within the right upper lobe. IMPRESSION: Feeding tube tip is in the body of the stomach. Electronically Signed   By: Signa Kell M.D.   On: 10/20/2020 11:43   DG Swallowing Func-Speech Pathology  Result Date: 11/16/2020 Table formatting from the original result was not included. Objective Swallowing Evaluation: Type of Study: MBS-Modified Barium Swallow Study  Patient Details Name: Edward Garner MRN: [id number] Date of Birth: 06-10-1965 Today's Date: 11/16/2020 Time: SLP Start Time (ACUTE ONLY): 1030 -SLP Stop Time (ACUTE ONLY): 1050 SLP Time Calculation (min) (ACUTE ONLY): 20 min Past Medical History: Past Medical History: Diagnosis Date  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HCC)   Bipolar disorder (HCC)   Complication of anesthesia   Diabetes mellitus without complication (HCC)   Hyperlipidemia   Hypertension   Kidney disease   PONV (postoperative nausea and vomiting)   Sleep apnea   Sleep apnea, unspecified 10/19/2018  Spinal stenosis  Past Surgical History: Past Surgical History: Procedure Laterality Date  ANTERIOR CERVICAL DECOMP/DISCECTOMY FUSION N/A 10/16/2020  Procedure: ACDF - C3-C4 - C4-C5 - C5-C6;  Surgeon: Tia Alert, MD;  [facility]: [facility];  Service: Neurosurgery;  Laterality: N/A;  IR GASTROSTOMY TUBE MOD SED  11/08/2020  TONSILLECTOMY   HPI: Pt is a 55 y/o male who recently underwent cervical ACDF C3-C6 on 10/3 and discharged home on 10/4.  Patient presented to ER later that day after syncopal episode at home with negative CTH and cervical CT and required scalp laceration repair.  No other complaints, normal ddimer and hemodynamics and therefore discharged home. Pt returned to tje ER again for progressive lethargy at home and was found by EMS with O2  saturations in the 70's, placed on NRB and no improvement to narcan.  Additionally reported decrease oral intake due to ongoing pain and some difficulty swallowing, CT cervical showed slightly increased prevertebral swelling compared to 10/4 and narrowed AP diameter of the larynx the level of the epiglottis to 5 mm, CTH negative for acute intracranial process, and CTA PE was negative for PE, but showed multifocal pneumonia and thickening of esophageal wall possibly reflecting esophagitis, and hepatomegaly.  Subjective: alert Assessment / Plan / Recommendation CHL  IP CLINICAL IMPRESSIONS 11/16/2020 Clinical Impression Patient presents with mild improvement in swallow function as compared to previous two MBS's (10/13, 10/20) with most significant improvement being improved airway protection. He exhibited only one instance of trace aspiration (PAS 8)during the swallow with thin liquids and one instance of penetration without clearance of trace amount of nectar thick liquids (PAS 3). He was able to clear penetrate with cued throat clear and reswallow. Patient continues to exhibit limited laryngeal elevation and hyolaryngeal movement during swallow. With puree solids and nectar thick liquids, swallow was initiated at level of vallecular sinus and with thin liquids, swallow was initiated at level of pyriform sinus. After initial swallow of puree solids, patient had mild-moderate amount of bolus residuals in vallecular and pyriform sinus. With subsequent swallows, small amount of bolus transited through UES and into esophagus each time. Amount of pharyngeal residuals decreased but not eliminated with thin liquid consistency barium. SLP is recommending to start patient on Clear liquids (thin consistency) diet, continue meds via PEG and anticipate will require majority of nutrition still via PEG as well. Patient has been instructed to sit upright during PO's, swallow 2-3 extra times each sip, take small sips, clear throat and  reswallow intermittently and to consume small amounts overall during each PO intake event. SLP Visit Diagnosis Dysphagia, pharyngeal phase (R13.13) Attention and concentration deficit following -- Frontal lobe and executive function deficit following -- Impact on safety and function Moderate aspiration risk   CHL IP TREATMENT RECOMMENDATION 11/02/2020 Treatment Recommendations Therapy as outlined in treatment plan below   Prognosis 11/16/2020 Prognosis for Safe Diet Advancement Fair Barriers to Reach Goals Severity of deficits Barriers/Prognosis Comment -- CHL IP DIET RECOMMENDATION 11/16/2020 SLP Diet Recommendations Thin liquid;Other (Comment) Liquid Administration via Cup;Straw Medication Administration Via alternative means Compensations Slow rate;Small sips/bites;Minimize environmental distractions;Clear throat intermittently;Multiple dry swallows after each bite/sip Postural Changes Seated upright at 90 degrees   CHL IP OTHER RECOMMENDATIONS 11/16/2020 Recommended Consults -- Oral Care Recommendations Oral care BID Other Recommendations --   CHL IP FOLLOW UP RECOMMENDATIONS 11/06/2020 Follow up Recommendations Inpatient Rehab   CHL IP FREQUENCY AND DURATION 11/02/2020 Speech Therapy Frequency (ACUTE ONLY) min 2x/week Treatment Duration --      CHL IP ORAL PHASE 11/16/2020 Oral Phase WFL Oral - Pudding Teaspoon -- Oral - Pudding Cup -- Oral - Honey Teaspoon -- Oral - Honey Cup -- Oral - Nectar Teaspoon NT Oral - Nectar Cup WFL Oral - Nectar Straw -- Oral - Thin Teaspoon WFL Oral - Thin Cup -- Oral - Thin Straw -- Oral - Puree -- Oral - Mech Soft -- Oral - Regular -- Oral - Multi-Consistency -- Oral - Pill -- Oral Phase - Comment --  CHL IP PHARYNGEAL PHASE 11/16/2020 Pharyngeal Phase Impaired Pharyngeal- Pudding Teaspoon -- Pharyngeal -- Pharyngeal- Pudding Cup -- Pharyngeal -- Pharyngeal- Honey Teaspoon NT Pharyngeal -- Pharyngeal- Honey Cup -- Pharyngeal -- Pharyngeal- Nectar Teaspoon Delayed swallow  initiation-vallecula;Delayed swallow initiation-pyriform sinuses;Reduced anterior laryngeal mobility;Reduced laryngeal elevation;Pharyngeal residue - valleculae;Pharyngeal residue - pyriform Pharyngeal Material does not enter airway Pharyngeal- Nectar Cup Delayed swallow initiation-pyriform sinuses;Delayed swallow initiation-vallecula;Reduced anterior laryngeal mobility;Reduced epiglottic inversion;Reduced laryngeal elevation;Reduced airway/laryngeal closure;Penetration/Aspiration during swallow;Penetration/Apiration after swallow;Pharyngeal residue - valleculae;Pharyngeal residue - pyriform Pharyngeal Material enters airway, remains ABOVE vocal cords then ejected out;Material enters airway, remains ABOVE vocal cords and not ejected out Pharyngeal- Nectar Straw NT Pharyngeal -- Pharyngeal- Thin Teaspoon Delayed swallow initiation-pyriform sinuses;Delayed swallow initiation-vallecula;Reduced laryngeal elevation;Reduced anterior laryngeal mobility;Pharyngeal residue - pyriform;Pharyngeal residue - valleculae  Pharyngeal Material does not enter airway Pharyngeal- Thin Cup Reduced anterior laryngeal mobility;Delayed swallow initiation-pyriform sinuses;Delayed swallow initiation-vallecula;Reduced laryngeal elevation;Penetration/Aspiration during swallow;Pharyngeal residue - valleculae;Pharyngeal residue - pyriform;Trace aspiration Pharyngeal Material enters airway, passes BELOW cords without attempt by patient to eject out (silent aspiration) Pharyngeal- Thin Straw NT Pharyngeal -- Pharyngeal- Puree Delayed swallow initiation-vallecula;Reduced anterior laryngeal mobility;Reduced laryngeal elevation;Pharyngeal residue - valleculae;Pharyngeal residue - pyriform Pharyngeal -- Pharyngeal- Mechanical Soft -- Pharyngeal -- Pharyngeal- Regular -- Pharyngeal -- Pharyngeal- Multi-consistency -- Pharyngeal -- Pharyngeal- Pill -- Pharyngeal -- Pharyngeal Comment --  No flowsheet data found. Angela Nevin, MA, CCC-SLP Speech  Therapy              DG Swallowing Func-Speech Pathology  Result Date: 11/02/2020 Table formatting from the original result was not included. Objective Swallowing Evaluation: Type of Study: MBS-Modified Barium Swallow Study  Patient Details Name: Edward Garner MRN: [id number] Date of Birth: 1965-08-30 Today's Date: 11/02/2020 Time: SLP Start Time (ACUTE ONLY): 1030 -SLP Stop Time (ACUTE ONLY): 1130 SLP Time Calculation (min) (ACUTE ONLY): 60 min Past Medical History: Past Medical History: Diagnosis Date  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HCC)   Bipolar disorder (HCC)   Complication of anesthesia   Diabetes mellitus without complication (HCC)   Hyperlipidemia   Hypertension   Kidney disease   PONV (postoperative nausea and vomiting)   Sleep apnea   Sleep apnea, unspecified 10/19/2018  Spinal stenosis  Past Surgical History: Past Surgical History: Procedure Laterality Date  ANTERIOR CERVICAL DECOMP/DISCECTOMY FUSION N/A 10/16/2020  Procedure: ACDF - C3-C4 - C4-C5 - C5-C6;  Surgeon: Tia Alert, MD;  Location: [facility] OR;  Service: Neurosurgery;  Laterality: N/A;  TONSILLECTOMY   HPI: Pt is a 55 y/o male who recently underwent cervical ACDF C3-C6 on 10/3 and discharged home on 10/4.  Patient presented to ER later that day after syncopal episode at home with negative CTH and cervical CT and required scalp laceration repair.  No other complaints, normal ddimer and hemodynamics and therefore discharged home. Pt returned to tje ER again for progressive lethargy at home and was found by EMS with O2 saturations in the 70's, placed on NRB and no improvement to narcan.  Additionally reported decrease oral intake due to ongoing pain and some difficulty swallowing, CT cervical showed slightly increased prevertebral swelling compared to 10/4 and narrowed AP diameter of the larynx the level of the epiglottis to 5 mm, CTH negative for acute intracranial process, and CTA PE was negative for PE, but showed multifocal pneumonia and thickening of  esophageal wall possibly reflecting esophagitis, and hepatomegaly.  Subjective: alert Assessment / Plan / Recommendation CHL IP CLINICAL IMPRESSIONS 11/02/2020 Clinical Impression Pt presents with marginally improved swallow function, with improvements related to current mental status, however mechanics of swallowing remain impaired with no obvious etiology. (Prevertebral edema is minimal; CT head negative for acute neuro event).  Pt demonstrates poor pharyngeal squeeze, reduced mobility of hyolaryngeal structures, incomplete epiglottic closure over larynx, and reduced traction on UES for opening. Sluggish movements of structures lead to significant residue throughout pharynx (solids > liquids) and aspiration of thin liquids, penetration of nectars, and potential of spillage of residue from solids.  Pt has good sensation and a strong cough in response to aspiration. He swallows multiple times with single teaspoon boluses in an effort to transfer material from pharynx through UES, but these sub-swallows are minimally effective and material sits in pharynx.  Severity of dysphagia will not allow Edward Garner to meet his nutritional needs via  oral intake.  Recommend proceeding with PEG.  Consider allowing nectar-thick liquids to allow opportunities for swallowing and to stave off further deterioration related to disuse atrophy.  Recommend f/u SLP for dysphagia treatment in inpatient rehab.  D/W with B. Boyette from [facility] and pt's mother, Nicholes Calamity, who agrees with plan. SLP Visit Diagnosis Dysphagia, oropharyngeal phase (R13.12) Attention and concentration deficit following -- Frontal lobe and executive function deficit following -- Impact on safety and function Moderate aspiration risk   CHL IP TREATMENT RECOMMENDATION 11/02/2020 Treatment Recommendations Therapy as outlined in treatment plan below   Prognosis 11/02/2020 Prognosis for Safe Diet Advancement Fair Barriers to Reach Goals -- Barriers/Prognosis Comment --  CHL IP DIET RECOMMENDATION 11/02/2020 SLP Diet Recommendations Nectar thick liquid Liquid Administration via Cup Medication Administration Via alternative means Compensations -- Postural Changes --   CHL IP OTHER RECOMMENDATIONS 11/02/2020 Recommended Consults -- Oral Care Recommendations Oral care QID Other Recommendations Order thickener from pharmacy   CHL IP FOLLOW UP RECOMMENDATIONS 11/02/2020 Follow up Recommendations Inpatient Rehab   CHL IP FREQUENCY AND DURATION 11/02/2020 Speech Therapy Frequency (ACUTE ONLY) min 2x/week Treatment Duration --      CHL IP ORAL PHASE 11/02/2020 Oral Phase WFL Oral - Pudding Teaspoon -- Oral - Pudding Cup -- Oral - Honey Teaspoon -- Oral - Honey Cup -- Oral - Nectar Teaspoon WFL Oral - Nectar Cup WFL Oral - Nectar Straw -- Oral - Thin Teaspoon WFL Oral - Thin Cup -- Oral - Thin Straw -- Oral - Puree -- Oral - Mech Soft -- Oral - Regular -- Oral - Multi-Consistency -- Oral - Pill -- Oral Phase - Comment --  CHL IP PHARYNGEAL PHASE 11/02/2020 Pharyngeal Phase Impaired Pharyngeal- Pudding Teaspoon -- Pharyngeal -- Pharyngeal- Pudding Cup -- Pharyngeal -- Pharyngeal- Honey Teaspoon Delayed swallow initiation-pyriform sinuses;Reduced pharyngeal peristalsis;Reduced epiglottic inversion;Reduced anterior laryngeal mobility;Reduced airway/laryngeal closure;Penetration/Aspiration during swallow;Penetration/Apiration after swallow;Trace aspiration;Pharyngeal residue - valleculae;Pharyngeal residue - pyriform Pharyngeal Material enters airway, CONTACTS cords and not ejected out Pharyngeal- Honey Cup -- Pharyngeal -- Pharyngeal- Nectar Teaspoon Delayed swallow initiation-pyriform sinuses;Reduced pharyngeal peristalsis;Reduced epiglottic inversion;Reduced anterior laryngeal mobility;Reduced airway/laryngeal closure;Penetration/Aspiration during swallow;Penetration/Apiration after swallow;Trace aspiration;Pharyngeal residue - valleculae;Pharyngeal residue - pyriform Pharyngeal Material  enters airway, remains ABOVE vocal cords and not ejected out Pharyngeal- Nectar Cup NT Pharyngeal -- Pharyngeal- Nectar Straw Delayed swallow initiation-pyriform sinuses;Reduced pharyngeal peristalsis;Reduced epiglottic inversion;Reduced anterior laryngeal mobility;Reduced airway/laryngeal closure;Penetration/Aspiration during swallow;Penetration/Apiration after swallow;Trace aspiration;Pharyngeal residue - valleculae;Pharyngeal residue - pyriform Pharyngeal Material enters airway, remains ABOVE vocal cords and not ejected out Pharyngeal- Thin Teaspoon NT Pharyngeal -- Pharyngeal- Thin Cup -- Pharyngeal -- Pharyngeal- Thin Straw Delayed swallow initiation-pyriform sinuses;Reduced pharyngeal peristalsis;Reduced epiglottic inversion;Reduced anterior laryngeal mobility;Reduced airway/laryngeal closure;Penetration/Aspiration during swallow;Penetration/Apiration after swallow;Trace aspiration;Pharyngeal residue - valleculae;Pharyngeal residue - pyriform Pharyngeal Material enters airway, passes BELOW cords and not ejected out despite cough attempt by patient Pharyngeal- Puree Delayed swallow initiation-vallecula;Reduced pharyngeal peristalsis;Reduced epiglottic inversion;Reduced anterior laryngeal mobility;Reduced airway/laryngeal closure;Pharyngeal residue - valleculae;Pharyngeal residue - pyriform Pharyngeal -- Pharyngeal- Mechanical Soft -- Pharyngeal -- Pharyngeal- Regular -- Pharyngeal -- Pharyngeal- Multi-consistency -- Pharyngeal -- Pharyngeal- Pill -- Pharyngeal -- Pharyngeal Comment --  No flowsheet data found. Blenda Mounts Laurice 11/02/2020, 12:40 PM              DG Swallowing Func-Speech Pathology  Result Date: 10/26/2020 Table formatting from the original result was not included. Objective Swallowing Evaluation: Type of Study: MBS-Modified Barium Swallow Study  Patient Details Name: Edward Garner MRN: [id number] Date of Birth: [birth year] Today's Date:  10/26/2020 Time: SLP Start Time (ACUTE ONLY):  1320 -SLP Stop Time (ACUTE ONLY): 1332 SLP Time Calculation (min) (ACUTE ONLY): 12 min Past Medical History: Past Medical History: Diagnosis Date  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HCC)   Bipolar disorder (HCC)   Complication of anesthesia   Diabetes mellitus without complication (HCC)   Hyperlipidemia   Hypertension   Kidney disease   PONV (postoperative nausea and vomiting)   Sleep apnea   Sleep apnea, unspecified 10/19/2018  Spinal stenosis  Past Surgical History: Past Surgical History: Procedure Laterality Date  ANTERIOR CERVICAL DECOMP/DISCECTOMY FUSION N/A 10/16/2020  Procedure: ACDF - C3-C4 - C4-C5 - C5-C6;  Surgeon: Tia Alert, MD;  Location: [facility] OR;  Service: Neurosurgery;  Laterality: N/A;  TONSILLECTOMY   HPI: Pt is a 55 y/o male who recently underwent cervical ACDF C3-C6 on 10/3 and discharged home on 10/4.  Patient presented to ER later that day after syncopal episode at home with negative CTH and cervical CT and required scalp laceration repair.  No other complaints, normal ddimer and hemodynamics and therefore discharged home. Pt returned to tje ER again for progressive lethargy at home and was found by EMS with O2 saturations in the 70's, placed on NRB and no improvement to narcan.  Additionally reported decrease oral intake due to ongoing pain and some difficulty swallowing, CT cervical showed slightly increased prevertebral swelling compared to 10/4 and narrowed AP diameter of the larynx the level of the epiglottis to 5 mm, CTH negative for acute intracranial process, and CTA PE was negative for PE, but showed multifocal pneumonia and thickening of esophageal wall possibly reflecting esophagitis, and hepatomegaly.  No data recorded Assessment / Plan / Recommendation CHL IP CLINICAL IMPRESSIONS 10/26/2020 Clinical Impression Pt was alert, but demonstrated difficulty attending to tasks and his implementation of compensatory strategies was variable; the impact of these on his performance is considered. Pt s/p ACDF  10/3 and hardward noted C3-C-6 with the prevetebral edema as noted on CT soft tissue. He presented with oropharyngeal dysphagia characterized by reduced bolus cohesion, reduced pharyngeal constriction, a pharyngeal delay, reduced hyolaryngeal elavation and reduced anterior laryngeal movement. Some hyolaryngeal elevation was noted but anterior laryngeal movement was reduced and epiglottic inversion was limited, at least partly, due to edema. He was able to swallow very small portions of boluses with repeated swallows, but duration of cricopharyngeal relaxation was reduced. Moderate vallecular residue and pyriform sinus residue were  noted accross limited trials. Penetration (PAS 3, 5) was noted before deglutition and aspiration (PAS 7,8) noted during and after. Aspiration frequently resulted in coughing which was inconsistently effective propelling the aspirate superior to the vocal folds, but it was often ineffective in fully expelling penetrate/aspirate from the larynx, and a recurrence of aspiration was therefore demonstrated. With a cued effortful swallow and nectar thick liquids via cup, pt was once able to demonstrate improved laryngeal vestibule closure and increased airway protection. This could not be replicated, but this reduced the amount of laryngeal invasion and resulted in there only being laryngeal invasion after the swallow. Pt is at risk for aspiration before, during and after deglutition due to the pharyngeal delay, premature spillage, reduced airway protection, and pharyngeal residue. It is recommended that the NPO status be maitained with continued allowance of ice chips following oral care. SLP will continue to follow pt for treatment, but is anticipated that improvement in the pharyngeal edema will facilitate some increased epiglottic inversion and reduce aspiration risk. SLP Visit Diagnosis Dysphagia, oropharyngeal phase (R13.12) Attention and  concentration deficit following -- Frontal lobe and  executive function deficit following -- Impact on safety and function Moderate aspiration risk   CHL IP TREATMENT RECOMMENDATION 10/26/2020 Treatment Recommendations Therapy as outlined in treatment plan below   Prognosis 10/26/2020 Prognosis for Safe Diet Advancement Good Barriers to Reach Goals Severity of deficits Barriers/Prognosis Comment -- CHL IP DIET RECOMMENDATION 10/26/2020 SLP Diet Recommendations NPO;Ice chips PRN after oral care Liquid Administration via -- Medication Administration Via alternative means Compensations -- Postural Changes Seated upright at 90 degrees   CHL IP OTHER RECOMMENDATIONS 10/26/2020 Recommended Consults -- Oral Care Recommendations Oral care QID Other Recommendations --   CHL IP FOLLOW UP RECOMMENDATIONS 10/26/2020 Follow up Recommendations None   CHL IP FREQUENCY AND DURATION 10/26/2020 Speech Therapy Frequency (ACUTE ONLY) min 2x/week Treatment Duration 2 weeks      CHL IP ORAL PHASE 10/26/2020 Oral Phase Impaired Oral - Pudding Teaspoon -- Oral - Pudding Cup -- Oral - Honey Teaspoon -- Oral - Honey Cup -- Oral - Nectar Teaspoon Decreased bolus cohesion;Premature spillage Oral - Nectar Cup Decreased bolus cohesion;Premature spillage Oral - Nectar Straw -- Oral - Thin Teaspoon Decreased bolus cohesion;Premature spillage Oral - Thin Cup -- Oral - Thin Straw -- Oral - Puree -- Oral - Mech Soft -- Oral - Regular -- Oral - Multi-Consistency -- Oral - Pill -- Oral Phase - Comment --  CHL IP PHARYNGEAL PHASE 10/26/2020 Pharyngeal Phase Impaired Pharyngeal- Pudding Teaspoon -- Pharyngeal -- Pharyngeal- Pudding Cup -- Pharyngeal -- Pharyngeal- Honey Teaspoon -- Pharyngeal -- Pharyngeal- Honey Cup -- Pharyngeal -- Pharyngeal- Nectar Teaspoon Penetration/Aspiration before swallow;Penetration/Aspiration during swallow;Penetration/Apiration after swallow;Trace aspiration;Pharyngeal residue - valleculae;Pharyngeal residue - pyriform;Pharyngeal residue - posterior pharnyx;Reduced laryngeal  elevation;Reduced airway/laryngeal closure;Reduced anterior laryngeal mobility;Reduced epiglottic inversion Pharyngeal Material enters airway, remains ABOVE vocal cords and not ejected out;Material enters airway, CONTACTS cords and not ejected out;Material enters airway, passes BELOW cords and not ejected out despite cough attempt by patient Pharyngeal- Nectar Cup Penetration/Aspiration before swallow;Penetration/Aspiration during swallow;Penetration/Apiration after swallow;Trace aspiration;Pharyngeal residue - valleculae;Pharyngeal residue - pyriform;Pharyngeal residue - posterior pharnyx;Reduced laryngeal elevation;Reduced airway/laryngeal closure;Reduced anterior laryngeal mobility;Reduced epiglottic inversion;Moderate aspiration Pharyngeal Material enters airway, remains ABOVE vocal cords and not ejected out;Material enters airway, CONTACTS cords and not ejected out;Material enters airway, passes BELOW cords and not ejected out despite cough attempt by patient Pharyngeal- Nectar Straw -- Pharyngeal -- Pharyngeal- Thin Teaspoon Penetration/Aspiration before swallow;Penetration/Aspiration during swallow;Penetration/Apiration after swallow;Trace aspiration;Pharyngeal residue - valleculae;Pharyngeal residue - pyriform;Pharyngeal residue - posterior pharnyx;Reduced laryngeal elevation;Reduced airway/laryngeal closure;Reduced anterior laryngeal mobility;Reduced epiglottic inversion Pharyngeal Material enters airway, remains ABOVE vocal cords and not ejected out;Material enters airway, CONTACTS cords and not ejected out;Material enters airway, passes BELOW cords and not ejected out despite cough attempt by patient Pharyngeal- Thin Cup -- Pharyngeal -- Pharyngeal- Thin Straw -- Pharyngeal -- Pharyngeal- Puree -- Pharyngeal -- Pharyngeal- Mechanical Soft -- Pharyngeal -- Pharyngeal- Regular -- Pharyngeal -- Pharyngeal- Multi-consistency -- Pharyngeal -- Pharyngeal- Pill -- Pharyngeal -- Pharyngeal Comment --  Shanika I.  Vear Clock, MS, CCC-SLP Acute Rehabilitation Services Office number [phone] Pager [phone] Scheryl Marten 10/26/2020, 3:36 PM               Labs:  Basic Metabolic Panel: Recent Labs  Lab 11/10/20 1555 11/13/20 0554  NA  --  138  K  --  4.8  CL  --  101  CO2  --  31  GLUCOSE  --  130*  BUN  --  17  CREATININE 0.70 0.88  CALCIUM  --  9.5    CBC: Recent Labs  Lab 11/10/20 1555 11/13/20 0554  WBC 7.0 5.9  NEUTROABS  --  2.2  HGB 11.9* 12.7*  HCT 35.6* 38.0*  MCV 97.8 96.2  PLT 395 297    CBG: Recent Labs  Lab 11/16/20 0642 11/16/20 1120 11/16/20 1634 11/16/20 2018 11/16/20 2103  GLUCAP 159* 221* 120* 270* 307*   Family history.  Mother with non-Hodgkin's lymphoma.  Father with diabetes thyroid disease hypertension hyperlipidemia bipolar disorder.  Denies any colon cancer esophageal cancer or rectal cancer  Brief HPI:   Edward Garner is a 55 y.o. right-handed male with history of tobacco use, OSA hypertension hyperlipidemia diabetes mellitus bipolar disorder with schizoaffective disorder and recent ACDF 10/16/2020.  Patient lives alone.  Had been staying with his mother after recent ACDF.  Independent prior to admission.  Presented 10/19/2020 after recent C3-6 ACDF 10/16/2020 per Dr. [name] for cervical spondylosis/stenosis/myelopathy.  He was discharged home 10/17/2020 independent with ADLs.  Patient initially presented back same day to the ER after syncopal episode at home.  CT cervical spine negative.  He was discharged back home.  Presented 10/19/2020 after syncopal episode altered mental status and respiratory distress.  Family also reported decrease in oral intake after recent surgery.  EMS reports O2 saturation in the 70s on room air.  Placed on nonrebreather mask saturations rebounded to the low 90s.  Patient did receive Narcan.  Cranial CT scan negative for acute changes.  CT angiogram of the chest showed no evidence of pulmonary emboli however there  was extensive opacities throughout both lungs consistent with multifocal infection.  CT soft tissue of the neck showed slightly increased prevertebral swelling compared to 10/17/2020 with decreased soft tissue gas consistent with recent ACDF.  No focal collection within the parapharyngeal retropharyngeal or prevertebral soft tissues.  Admission chemistries BNP 99.4 urine culture no growth troponin negative lactic acid 1.3.  He was placed on broad-spectrum antibiotics for multifocal pneumonia did complete a 7-day course of Unasyn.  He was weaned from supplemental oxygen.  With noted findings on CT scan of prevertebral swelling compared to scan 10/17/2020 a nasogastric tube was placed for nutritional support for dysphagia as well as evaluation for possible PEG tube placement with CT of abdomen pelvis completed revealing enlarged left liver lobe with presence of small intestine in the way of PEG tube placement reviewed by interventional radiology and underwent PEG tube placement 10/19/2020.  Hospital course psychiatry services consulted for patient's bipolar disorder he was noted to be compliant with medications at home maintained on delirium precautions.  Subcutaneous Lovenox for DVT prophylaxis.  Therapy evaluations completed due to patient's decreased functional mobility was admitted for a comprehensive rehab program.   Hospital Course: Edward Garner was admitted to rehab 11/10/2020 for inpatient therapies to consist of PT, ST and OT at least three hours five days a week. Past admission physiatrist, therapy team and rehab RN have worked together to provide customized collaborative inpatient rehab.  Pertain to patient's debility syncope and altered mental status secondary to acute hypoxic respiratory failure in the setting of multifocal pneumonia and history of OSA.  He completed course of Unasyn.  He remains on subcutaneous Lovenox for DVT prophylaxis no bleeding episodes.  In regards to patient's recent ACDF  he would follow-up with neurosurgery.  PEG tube placed 11/08/2020 per interventional radiology patient remained NPO.  Bipolar disorder with schizoaffective disorder follow-up psychiatry services maintained on Prozac as well as Haldol  with Vyvanse scheduled 50 mg daily as well as valproic acid 500 mg 3 times daily and Ativan as needed.  His Zyprexa had been discontinued due to hyperglycemia.  He was attending full therapies.  Pain managed with use of Zanaflex scheduled nightly as well as hydrocodone as needed.  Blood sugars controlled hemoglobin A1c 8.0 patient currently on insulin as he had been on oral agents prior to admission he would stay with insulin at this time due to simplicity of care while PEG tube was in place.  Blood pressure remained soft Cozaar and Lopressor on hold.  Crestor ongoing for hyperlipidemia.   Blood pressures were monitored on TID basis and soft and monitored  Diabetes has been monitored with ac/hs CBG checks and SSI was use prn for tighter BS control.    Rehab course: During patient's stay in rehab weekly team conferences were held to monitor patient's progress, set goals and discuss barriers to discharge. At admission, patient required minimal assist 70 feet rolling walker minimal assist stand pivot transfers  Physical exam.  Blood pressure 109/54 pulse 94 temperature 97.9 respirations 18 oxygen saturation 92% room air Constitutional.  No acute distress HEENT Head.  Normocephalic and atraumatic Eyes.  Pupils round and reactive to light no discharge.nystagmus Cardiac regular rate rhythm any extra sounds or murmur heard Abdomen.  Soft nontender positive bowel sounds PEG tube in place Respiratory effort normal no respiratory distress without wheeze Neurologic.  Alert makes eye contact with examiner.  Provides name and age.  He did have some initial difficulty with hospital course tangential at times and verbose.  4+/5 strength throughout  He/She  has had improvement in  activity tolerance, balance, postural control as well as ability to compensate for deficits. He/She has had improvement in functional use RUE/LUE  and RLE/LLE as well as improvement in awareness.  Patient transfers sit to stand modified independent.  Donned his abdominal binder independently ambulates 150 feet without assistive device.  Performs standing toe taps to a 6 inch platform with contact-guard.  Patient able to ambulate in his room to gather his belongings for ADLs toileting independent hygiene.  Full family teaching completed plan discharged to home       Disposition: Discharged to home   Diet: N.p.o.  Glucerna 474 mL 3 times daily with meals and bedtime per tube  Free water 185 mL every 8 hours by tube  Special Instructions: No driving smoking or alcohol  Abdominal binder to protect PEG tube  Continue CPAP  Medications at discharge 1.  Colace 100 mg twice daily as needed tube 2.  Pepcid 20 mg twice daily tube 3.  Prozac 40 mg daily tube 4.  Haldol 2 mg nightly tube 5.  Hydrocodone 1 to 2 tablets every 4 hours as needed tube 6.  Semglee 10 units twice daily 7.  Vyvanse 50 mg daily tube 8.  Ativan 0.5 mg twice daily as needed tube 9.  Multivitamin daily by tube 10.  MiraLAX daily by tube 11.  Crestor 20 mg nightly tube 12.  Thiamine 200 mg daily by tube 13.  Zanaflex 2 mg nightly tube 14.  Valproic acid 500 mg 3 times daily tube 15.  Flomax 0.4 mg daily   30-35 minutes were spent completing discharge summary and discharge planning     Follow-up Information     Ranelle Oyster, MD Follow up.   Specialty: Physical Medicine and Rehabilitation Why: No formal follow-up needed Contact information: [address]  [place] [address] [phone]         Oley Balm, MD Follow up.   Specialties: Interventional Radiology, Radiology Why: Call for appointment Contact information: [address] [place]  [address] [phone]         Tia Alert, MD Follow up.   Specialty: Neurosurgery Why: Call for appointment Contact information: [address] [place] [address] [phone]                 Signed: Mcarthur Rossetti Manveer Gomes 11/17/2020, 5:19 AM

## 2020-11-16 NOTE — Progress Notes (Addendum)
Patient ID: Edward Garner, male   DOB: 03-18-1965, 55 y.o.   MRN: [id number]  Met with pt to see if he passed his swallow he reports he is on a clear liquid diet, but his lunch was wonderful. So much better than just getting fed in his stomach tube. Discussed with him follow up and he wanted worker to check with his Mom since she would be the one who would need to transport back and forth. Contacted Mom to discuss follow up and tube feedings. She feels it would work best for home health and is agreeable with ordering Glucerna from [facility] to be delivered to her home. Have made referral to [facility] for Glucerna to be delivered to home and will look for home health agency who can begin at least by Monday. Mom aware when here tomorrow will need to be educated on tube feedings along with pt. She is fine with this.  1:51 pm [facility] has accepted the referral for home health services.

## 2020-11-16 NOTE — Progress Notes (Signed)
Physical Therapy Session Note  Patient Details  Name: Edward Garner MRN: [id number] Date of Birth: 11-16-1965  Today's Date: 11/16/2020 PT Individual Time: 0865-7846; 9629-5284 PT Individual Time Calculation (min): 24 min and 57 min  Short Term Goals: Week 1:  PT Short Term Goal 1 (Week 1): =LTGs d/t ELOS  Skilled Therapeutic Interventions/Progress Updates:  Session 1 Pt received seated in recliner in room, reported pain as 1/10 in cervical spine and was premedicated. Offered distraction and light exercise for pain relief. Emphasis of session on dynamic balance for reduced fall risk. Pt performed transfers mod I throughout session and ambulated >200' w/S* and no AD. Noted slight path deviations to R side, bilateral toe out and wide BOS. In main gym, performed alt. Toe taps to 6" step w/ipsilateral trunk rotation and ball slam w/min A for minor retropulsion correction, 2x12 reps per side. Noted pt lost balance when turning to R > L side, but was able to self-correct balance well majority of time. Pt then practiced heel-to-toe walking on green line x1 in main hallway w/min A, pt frequently lost balance to R side and used wall to correct balance. Pt ambulated >150' back to room w/S* and was left seated in recliner in room, all needs in reach, safety alarm on.   Session 2  Pt received supine in bed, denied pain and was agreeable to PT. Emphasis of session on gait training on uneven surfaces and prepare for DC. Pt performed bed mobility mod I w/bedrail, donned abdominal binder independently and performed sit <>stands throughout session w/S*. Pt ambulated >500' from room to reflection fountain w/S*, noted significant R path deviations, especially when turning head to R side or talking while walking, no LOB noted. Provided min verbal cues for pt to focus on his gait, pt verbalized understanding. Pt ambulated 250', 300' and 130' on uneven surfaces, incline/declines and in grass w/S* for dynamic balance  and community reintegration. Pt ascended/descended 12 steps using L handrail, step-through pattern and S*. No verbal cues provided. Pt hyperverbal throughout session but in elevated mood due to being outside for the first time since surgery. Pt ambulated >500' back to room and voided continently in bathroom w/S*. Pt performed bed mobility mod I and was left supine in bed, all needs in reach.   Therapy Documentation Precautions:  Precautions Precautions: Fall Precaution Booklet Issued: No Precaution Comments: PEG Required Braces or Orthoses: Cervical Brace Cervical Brace: Soft collar, For comfort Restrictions Weight Bearing Restrictions: No   Therapy/Group: Individual Therapy Jill Alexanders Nashiya Disbrow, PT, DPT  11/16/2020, 7:45 AM

## 2020-11-16 NOTE — Progress Notes (Signed)
Occupational Therapy Session Note  Patient Details  Name: Edward Garner MRN: [id number] Date of Birth: Aug 21, 1965  Today's Date: 11/16/2020 OT Individual Time: 1025-1120 OT Individual Time Calculation (min): 55 min    Short Term Goals: Week 1:  OT Short Term Goal 1 (Week 1): STGs=LTGs due to ELOS  Skilled Therapeutic Interventions/Progress Updates:    Pt received in bed with his mother present. Updated his mom on his progress and that he is at a fairly independent level so when they go home, he could be alone for short periods of time.  Pt did want to shower so his mom left.  Pt ambulated around room to gather clothing and supplies, used the toilet, undressed and got in the shower.  He tends to move quickly and makes fast turns so cued him to pace himself and move a little more slowly. He recalled the need to sit down to doff and don pants over feet.  After showering he completed dressing and oral care standing at the sink. Worked on cleaning up the room with picking up clothing and putting it in laundry, picking up towels off the floor, etc. To increase stability with reaching to floor had pt practice holding on to the wall with one hand.  Pt able to do these tasks without A.   He does need several seated rest breaks due to constant abdominal pain.  Pt asking questions about stretching his neck, advised him to not do any PROM or actual stretches until he sees the surgeon for his f/u visit. Discussed preparing for discharge and pt feels ready, his main concern is eating but he is happy that he can now have clear liquids. Pt resting in recliner with chair alarm on and all needs met.    Therapy Documentation Precautions:  Precautions Precautions: Fall Precaution Booklet Issued: No Precaution Comments: PEG Required Braces or Orthoses: Cervical Brace Cervical Brace: Soft collar, For comfort Restrictions Weight Bearing Restrictions: No   Pain:   ADL: ADL Eating: NPO Grooming:  Independent Where Assessed-Grooming: Standing at sink Upper Body Bathing: Independent Where Assessed-Upper Body Bathing: Shower Lower Body Bathing: Supervision/safety Where Assessed-Lower Body Bathing: Shower Upper Body Dressing: Independent Where Assessed-Upper Body Dressing: Edge of bed Lower Body Dressing: Supervision/safety Where Assessed-Lower Body Dressing: Edge of bed Toileting: Independent Where Assessed-Toileting: Glass blower/designer: Close supervision Toilet Transfer Method:  (no RW) Tub/Shower Transfer: Not assessed Social research officer, government: Close supervision Social research officer, government Method: Heritage manager: Shower seat with back  Therapy/Group: Individual Therapy  [place] 11/16/2020, 9:03 AM

## 2020-11-16 NOTE — Progress Notes (Addendum)
PROGRESS NOTE   Subjective/Complaints:  Had a reasonable night. Moving well with therapy. Saw him this morning before he went down for MBS.  ROS: Patient denies fever, rash, sore throat, blurred vision, nausea, vomiting, diarrhea, cough, shortness of breath or chest pain,   headache, or mood change.    Objective:   No results found. No results for input(s): WBC, HGB, HCT, PLT in the last 72 hours.  No results for input(s): NA, K, CL, CO2, GLUCOSE, BUN, CREATININE, CALCIUM in the last 72 hours.  No intake or output data in the 24 hours ending 11/16/20 1039       Physical Exam: Vital Signs Blood pressure 134/73, pulse 80, temperature 98.3 F (36.8 C), resp. rate 16, height 6' (1.829 m), weight 70.6 kg, SpO2 95 %.   Constitutional: No distress . Vital signs reviewed. HEENT: NCAT, EOMI, oral membranes moist  Neck: supple Cardiovascular: RRR without murmur. No JVD    Respiratory/Chest: CTA Bilaterally without wheezes or rales. Normal effort    GI/Abdomen: BS +, non-tender, non-distended, PEG Ext: no clubbing, cyanosis, or edema Psych: pleasant and cooperative  Neurological: Ox3, moves all 4's. Fair insight and awareness. Functional memory and language.  Distal sensory loss in LE's.  Skin: post-op neck wound CDI Musc: neck with good rom, no spasms appreciated   Assessment/Plan: 1. Functional deficits which require 3+ hours per day of interdisciplinary therapy in a comprehensive inpatient rehab setting. Physiatrist is providing close team supervision and 24 hour management of active medical problems listed below. Physiatrist and rehab team continue to assess barriers to discharge/monitor patient progress toward functional and medical goals  Care Tool:  Bathing    Body parts bathed by patient: Right arm, Left arm, Chest, Abdomen, Front perineal area, Buttocks, Right upper leg, Left upper leg, Right lower leg, Left  lower leg, Face         Bathing assist Assist Level: Supervision/Verbal cueing     Upper Body Dressing/Undressing Upper body dressing   What is the patient wearing?: Pull over shirt, Orthosis    Upper body assist Assist Level: Independent    Lower Body Dressing/Undressing Lower body dressing      What is the patient wearing?: Pants, Underwear/pull up     Lower body assist Assist for lower body dressing: Supervision/Verbal cueing     Toileting Toileting    Toileting assist Assist for toileting: Independent     Transfers Chair/bed transfer  Transfers assist     Chair/bed transfer assist level: Supervision/Verbal cueing     Locomotion Ambulation   Ambulation assist      Assist level: Supervision/Verbal cueing Assistive device: No Device Max distance: >150'   Walk 10 feet activity   Assist     Assist level: Supervision/Verbal cueing Assistive device: No Device   Walk 50 feet activity   Assist    Assist level: Supervision/Verbal cueing Assistive device: No Device    Walk 150 feet activity   Assist    Assist level: Supervision/Verbal cueing Assistive device: No Device    Walk 10 feet on uneven surface  activity   Assist Walk 10 feet on uneven surfaces activity did not occur: Safety/medical concerns  Wheelchair     Assist Is the patient using a wheelchair?: No             Wheelchair 50 feet with 2 turns activity    Assist            Wheelchair 150 feet activity     Assist          Blood pressure 134/73, pulse 80, temperature 98.3 F (36.8 C), resp. rate 16, height 6' (1.829 m), weight 70.6 kg, SpO2 95 %.  Medical Problem List and Plan: 1.  Debility/syncope/altered mental status secondary to acute hypoxic respiratory failure in the setting of multifocal pneumonia with history of OSA.  Antibiotic course of Unasyn completed.             -patient may shower but PEG site must be covered              -ELOS/Goals: 10-14 days            -Continue CIR therapies including PT, OT, and SLP   2.  Antithrombotics: -DVT/anticoagulation:  Pharmaceutical: Lovenox.  CT angiogram of chest negative for pulmonary emboli             -antiplatelet therapy: N/A 3. Pain Management: hydrocodone as needed.    11/1  added hs tizanidine for sleep, cervicalgia/spasm with improvement 4. Mood/bipolar/schizoaffective disorder: Prozac 40 mg daily, (Wellbutrin 100 mg twice daily) valproic acid 500 mg 3 times daily, Ativan 0.5 mg every 8 hours as needed             -antipsychotic agents: HS and prn haldol  -zyprexa stopped due to hyperglycemia  10/29- restarted Ativan at- his home dose  11/3 pt requested to resume his home vyvanse---resume 50mg      -pharm has 20/30mg  doses---combine for 50mg  5. Neuropsych: This patient is not capable of making decisions on his own behalf. 6. Skin/Wound Care: Routine skin checks 7. Fluids/Electrolytes/Nutrition: Routine in and outs with follow-up chemistries 8.  Diabetes mellitus.  Hemoglobin A1c 8.0.  Presently on Semglee 20 units twice daily.  Patient on Jardiance 10 mg daily, Glucophage 2000 mg daily, Glucotrol 5 mg daily prior to admission as well as Trulicity 3 mg weekly.  Resume as needed  10/31 cbg's improving off zyprexa---observe for now 9.  Dysphagia.  Currently with nasogastric tube feeds transitioning to PEG tube per interventional radiology completed 11/08/2020.  Follow-up speech therapy  11/3 pt cleared for clear thin liquids only   -will need to continue to take meds thru PEG and receive TF thru PEG to guarantee he's nourished enough 10.  Hypertension.  Cozaar 50 mg and Lopressor currently on hold due to BP being quite soft.--controlled 11/1 Hyperlipidemia.  Crestor 11. Urinary retention: scheduled toileting, continent 12. OSA on CPAP. Mother concerned about intermittent apneic spells--   -continue to monitor sats and VSS  LOS: 6 days A FACE TO FACE EVALUATION  WAS PERFORMED  13/3 11/16/2020, 10:39 AM

## 2020-11-17 ENCOUNTER — Other Ambulatory Visit (HOSPITAL_COMMUNITY): Payer: Self-pay

## 2020-11-17 LAB — GLUCOSE, CAPILLARY
Glucose-Capillary: 145 mg/dL — ABNORMAL HIGH (ref 70–99)
Glucose-Capillary: 219 mg/dL — ABNORMAL HIGH (ref 70–99)
Glucose-Capillary: 137 mg/dL — ABNORMAL HIGH (ref 70–99)
Glucose-Capillary: 105 mg/dL — ABNORMAL HIGH (ref 70–99)
Glucose-Capillary: 257 mg/dL — ABNORMAL HIGH (ref 70–99)

## 2020-11-17 MED ORDER — TIZANIDINE HCL 2 MG PO TABS
2.0000 mg | ORAL_TABLET | Freq: Every day | ORAL | 0 refills | Status: DC
  Filled 2020-11-17: qty 30, 30d supply, fill #0

## 2020-11-17 MED ORDER — LANTUS SOLOSTAR 100 UNIT/ML ~~LOC~~ SOPN
10.0000 [IU] | PEN_INJECTOR | Freq: Two times a day (BID) | SUBCUTANEOUS | 11 refills | Status: AC
  Filled 2020-11-17: qty 15, 75d supply, fill #0

## 2020-11-17 MED ORDER — GLUCERNA 1.2 CAL PO LIQD
474.0000 mL | Freq: Three times a day (TID) | ORAL | 0 refills | Status: AC
  Filled 2020-11-17: qty 237, 1d supply, fill #0

## 2020-11-17 MED ORDER — INSULIN PEN NEEDLE 32G X 4 MM MISC
0 refills | Status: AC
  Filled 2020-11-17: qty 100, 25d supply, fill #0

## 2020-11-17 MED ORDER — LORAZEPAM 0.5 MG PO TABS
0.5000 mg | ORAL_TABLET | Freq: Two times a day (BID) | ORAL | 0 refills | Status: AC | PRN
  Filled 2020-11-17: qty 30, 15d supply, fill #0

## 2020-11-17 MED ORDER — HYDROCODONE-ACETAMINOPHEN 5-325 MG PO TABS
1.0000 | ORAL_TABLET | ORAL | 0 refills | Status: AC | PRN
  Filled 2020-11-17: qty 30, 4d supply, fill #0

## 2020-11-17 MED ORDER — CYCLOSPORINE 0.05 % OP EMUL
1.0000 [drp] | Freq: Two times a day (BID) | OPHTHALMIC | 0 refills | Status: AC
  Filled 2020-11-17: qty 60, 30d supply, fill #0

## 2020-11-17 MED ORDER — THIAMINE HCL 100 MG PO TABS
200.0000 mg | ORAL_TABLET | Freq: Every day | ORAL | 0 refills | Status: DC
  Filled 2020-11-17: qty 60, 30d supply, fill #0

## 2020-11-17 MED ORDER — LISDEXAMFETAMINE DIMESYLATE 30 MG PO CAPS: 30.0000 mg | ORAL_CAPSULE | Freq: Every day | ORAL | 0 refills | Status: AC

## 2020-11-17 MED ORDER — ROSUVASTATIN CALCIUM 20 MG PO TABS
20.0000 mg | ORAL_TABLET | Freq: Every day | ORAL | 0 refills | Status: AC
  Filled 2020-11-17: qty 30, 30d supply, fill #0

## 2020-11-17 MED ORDER — TAMSULOSIN HCL 0.4 MG PO CAPS
0.4000 mg | ORAL_CAPSULE | Freq: Every morning | ORAL | 0 refills | Status: AC
  Filled 2020-11-17: qty 30, 30d supply, fill #0

## 2020-11-17 MED ORDER — ADULT MULTIVITAMIN W/MINERALS CH: 1.0000 | ORAL_TABLET | Freq: Every day | ORAL | Status: AC

## 2020-11-17 MED ORDER — VALPROIC ACID 250 MG/5ML PO SOLN
500.0000 mg | Freq: Three times a day (TID) | ORAL | 0 refills | Status: AC
  Filled 2020-11-17: qty 600, 20d supply, fill #0

## 2020-11-17 MED ORDER — HALOPERIDOL LACTATE 2 MG/ML PO CONC
1.0000 mg | Freq: Every day | ORAL | 0 refills | Status: DC
  Filled 2020-11-17: qty 15, 30d supply, fill #0

## 2020-11-17 MED ORDER — FAMOTIDINE 20 MG PO TABS
20.0000 mg | ORAL_TABLET | Freq: Two times a day (BID) | ORAL | 0 refills | Status: AC
  Filled 2020-11-17: qty 60, 30d supply, fill #0

## 2020-11-17 MED ORDER — LISDEXAMFETAMINE DIMESYLATE 20 MG PO CAPS: 20.0000 mg | ORAL_CAPSULE | Freq: Every day | ORAL | 0 refills | Status: AC

## 2020-11-17 MED ORDER — FLUOXETINE HCL 20 MG/5ML PO SOLN
40.0000 mg | Freq: Every day | ORAL | 3 refills | Status: AC
  Filled 2020-11-17: qty 120, 12d supply, fill #0

## 2020-11-17 MED ORDER — FREE WATER: 185.0000 mL | Freq: Three times a day (TID) | Status: AC

## 2020-11-17 MED ORDER — POLYETHYLENE GLYCOL 3350 17 G PO PACK: 17.0000 g | PACK | Freq: Every day | ORAL | 0 refills | Status: AC

## 2020-11-17 NOTE — Progress Notes (Signed)
Inpatient Rehabilitation Care Coordinator Discharge Note DC SAT 11/5  Patient Details  Name: Edward Garner MRN: [id number] Date of Birth: [birth year]   Discharge location: HOME WITH MOM AND STEP-DAD WHO CAN BE THERE AND ASSIST IF NEEDED. PT HOPES TO GO BACK TO HIS HOME WHEN ABLE  Length of Stay:  8 DAYS  Discharge activity level: MOD/I LEVEL  Home/community participation: ACTIVE  Patient response RX:[id number] Literacy - How often do you need to have someone help you when you read instructions, pamphlets, or other written material from your doctor or pharmacy?: Never  Patient response PY:[id number] Isolation - How often do you feel lonely or isolated from those around you?: Often  Services provided included: MD, RD, PT, OT, SLP, RN, CM, Pharmacy, SW  Financial Services:  Financial Services Utilized: Medicare    Choices offered to/list presented to: PT AND MOM  Follow-up services arranged:  Home Health, DME, Patient/Family has no preference for HH/DME agencies Home Health Agency: [facility]-PT, OT, RN    DME : [facility]- PEG FEEDS    Patient response to transportation need: Is the patient able to respond to transportation needs?: Yes In the past 12 months, has lack of transportation kept you from medical appointments or from getting medications?: No In the past 12 months, has lack of transportation kept you from meetings, work, or from getting things needed for daily living?: No    Comments (or additional information):MOM WAS HERE FOR EDUCATION REGARDING PEG FEEDINGS AND PT ALSO EDUCATED. BOTH FEEL COMFORTABLE WITH AND READY FOR DC HOME.  Patient/Family verbalized understanding of follow-up arrangements:  Yes  Individual responsible for coordination of the follow-up plan: [name]  [phone]  Confirmed correct DME delivered: [name] 11/17/2020    [name], [name]

## 2020-11-17 NOTE — Progress Notes (Signed)
Speech Language Pathology Discharge Summary  Patient Details  Name: Edward Garner MRN: [id number] Date of Birth: September 11, 1965  Today's Date: 11/17/2020 SLP Individual Time: 1000-1045 SLP Individual Time Calculation (min): 45 min   Skilled Therapeutic Interventions:  Patient seen for skilled ST session focused on dysphagia goals and education for planned discharge to mother's home tomorrow. Patient reported that he has not had any coughing or other difficulties with swallowing and demonstrated how pushing his shoulders forward seems to help his swallow. SLP did not observe any overt s/s aspiration or penetration with a couple sips of water. Patient's breakfast tray in room and appeared to be 85% consumed. SLP provided patient with screenshot images from MBS films and described his particular dysphagia. Patient did report he feels liquids in throat when consuming and SLP showed him the vallecular and pyriform sinus residuals that were present in MBS. Education completed and patient appropriate for discharge at this time.     Patient has met 5 of 5 long term goals.  Patient to discharge at overall Supervision;Modified Independent level.  Reasons goals not met: N/A   Clinical Impression/Discharge Summary: Patient made good progress and met all 5 LTG's in areas of dysphagia and cognition. Patient had repeat MBS on 11/3 which showed mild improvement overall in swallow function and he was able to be upgraded from NPO to clear liquids (thin). Patient continues with pharyngeal retention of solid and liquid boluses but when 11/3 MBS compared to previous two, patient demonstrated significant improvement in airway protection. He has been educated and did demonstrate recall and understanding of swallow precautions of swallowing 2-3 extra times with each sip of liquids. He and his mother were also both educated regarding continued need for PEG feedings secondary to swallow impairment preventing him from being  able to consume enough PO's throughout the day to meet his nutritional/caloric needs. SLP is recommending HH vs OP SLP upon discharge to continue with dysphagia treatment.  Care Partner:  Caregiver Able to Provide Assistance: Yes  Type of Caregiver Assistance: Cognitive;Physical  Recommendation:  Home Health SLP;Outpatient SLP  Rationale for SLP Follow Up: Maximize swallowing safety;Reduce caregiver burden   Equipment: N/A for ST   Reasons for discharge: Discharged from hospital;Treatment goals met   Patient/Family Agrees with Progress Made and Goals Achieved: Yes    Sonia Baller, MA, CCC-SLP Speech Therapy

## 2020-11-17 NOTE — Progress Notes (Signed)
Occupational Therapy Session Note  Patient Details  Name: Edward Garner MRN: [id number] Date of Birth: August 27, 1965  Today's Date: 11/17/2020 OT Individual Time: 1055-1200 OT Individual Time Calculation (min): 65 min    Short Term Goals: Week 1:  OT Short Term Goal 1 (Week 1): STGs=LTGs due to ELOS  Skilled Therapeutic Interventions/Progress Updates:    Pt received in room and asked if he could be cleared to move in his room independently. He stated he has had several incidents when he needed to toilet urgently and staff did not arrive on time.  Discussed he could be independent BUT only if he has non slip socks on or his socks and shoes. He should not use his UGGs or regular socks. Pt agreed.  He got up and moved around room independently to check dresser drawers, washed face and brushed teeth at sink, toileted.   Pt then ambulated to day room and then to main gym (over 300 ft) with no A. Pt then engaged in wall slides for L shoulder (AROM to 120), 2# wrist weights for B sh flex to 90 degrees, reaching ball in and out, and squats 12 x 3.    Pt talked quite a bit about his lifestyle and his challenges with over spending.  Recommended he work with a Veterinary surgeon as much of his overspending is related to his bipolar d/o (pt stated this).  Pt is feeling ready to return home.  Pt ambulated back to his room. Pt is independent in his room.   Therapy Documentation Precautions:  Precautions Precautions: Fall Precaution Booklet Issued: No Precaution Comments: PEG Required Braces or Orthoses: Cervical Brace Cervical Brace: Soft collar, For comfort Restrictions Weight Bearing Restrictions: No   Pain: Pain Assessment Pain Scale: 0-10 Pain Score: 7  Faces Pain Scale: No hurt Pain Type: Acute pain Pain Location: Neck Pain Orientation: Posterior Pain Radiating Towards: throat Pain Descriptors / Indicators: Aching Pain Frequency: Intermittent Pain Onset: Gradual Patients Stated Pain  Goal: 1 Pain Intervention(s): Medication (See eMAR) Multiple Pain Sites: No ADL: ADL Eating: NPO Grooming: Independent Where Assessed-Grooming: Standing at sink Upper Body Bathing: Independent Where Assessed-Upper Body Bathing: Shower Lower Body Bathing: Modified independent Where Assessed-Lower Body Bathing: Shower Upper Body Dressing: Independent Where Assessed-Upper Body Dressing: Edge of bed Lower Body Dressing: Supervision/safety Where Assessed-Lower Body Dressing: Edge of bed Toileting: Independent Where Assessed-Toileting: Teacher, adult education: Close supervision Toilet Transfer Method:  (no RW) Tub/Shower Transfer: Not assessed Film/video editor: Close supervision Film/video editor Method: Designer, industrial/product: Shower seat with back   Therapy/Group: Individual Therapy  Tylie Golonka 11/17/2020, 8:25 AM

## 2020-11-17 NOTE — Progress Notes (Signed)
Nutrition Follow-up  DOCUMENTATION CODES:   Not applicable  INTERVENTION:  Continue bolus tube feeds using Glucerna 1.2 cal formula via PEG at volumes of 474 ml (2 ARC/cartons) given QID.    Provide 45 ml Prosource TF (or equivalent) once daily per tube.   Provide free water flushes of 185 ml TID per tube.    Tube feeding regimen provides 2315 kcal, 125 grams of protein, and 2091 ml free water.  Per MD, diet advancement as outpatient.   NUTRITION DIAGNOSIS:   Inadequate oral intake related to inability to eat as evidenced by NPO status; diet advanced; progressing  GOAL:   Patient will meet greater than or equal to 90% of their needs; met with TF  MONITOR:   TF tolerance, Labs, Weight trends, I & O's, Skin  REASON FOR ASSESSMENT:   Consult Enteral/tube feeding initiation and management  ASSESSMENT:   55 year old right-handed male with history of tobacco use/OSA, hypertension, hyperlipidemia, diabetes type 2, bipolar/schizoaffective disorder as well as recent C3-6 ACDF 10/16/2020. With noted findings on CT scan of prevertebral swelling compared to  scan 10/17/2020 a nasogastric tube was placed for nutritional support for dysphagia. Underwent PEG tube placement 11/08/2020. Pt with Debility/syncope/altered mental status secondary to acute hypoxic respiratory failure in the setting of multifocal pneumonia with history of OSA. Therapy evaluations completed due to patient decreased functional mobility altered mental status was admitted for a comprehensive rehab program.  Diet has been advanced to a clear liquid diet. Per MD, plans to continue tube feeds via PEG for adequate nutrition. Recommend continuation of current tube feeding regimen. May PO ad lib clear liquids. Plans for diet advancement as outpatient. Plans for discharge tomorrow.   Labs and medications reviewed.   Diet Order:   Diet Order             Diet clear liquid Room service appropriate? Yes; Fluid consistency:  Thin  Diet effective now                   EDUCATION NEEDS:   Not appropriate for education at this time  Skin:  Skin Assessment: Reviewed RN Assessment Skin Integrity Issues:: Incisions Incisions: neck  Last BM:  11/3  Height:   Ht Readings from Last 1 Encounters:  11/10/20 6' (1.829 m)    Weight:   Wt Readings from Last 1 Encounters:  11/17/20 69.6 kg    Ideal Body Weight:  80.9 kg  BMI:  Body mass index is 20.81 kg/m.  Estimated Nutritional Needs:   Kcal:  6803-2122  Protein:  115-130 grams  Fluid:  >/= 2.3 L/day  Corrin Parker, MS, RD, LDN RD pager number/after hours weekend pager number on Amion.

## 2020-11-17 NOTE — Progress Notes (Signed)
PROGRESS NOTE   Subjective/Complaints:  Up with PT. No new issues. Neck feeling better. Thinks that his swallowing improved further yesterday afternoon with some techniques he's been trying.  ROS: Patient denies fever, rash, sore throat, blurred vision, nausea, vomiting, diarrhea, cough, shortness of breath or chest pain,  headache, or mood change.    Objective:   DG Swallowing Func-Speech Pathology  Result Date: 11/16/2020 Table formatting from the original result was not included. Objective Swallowing Evaluation: Type of Study: MBS-Modified Barium Swallow Study  Patient Details Name: Edward Garner MRN: [id number] Date of Birth: 1965-05-17 Today's Date: 11/16/2020 Time: SLP Start Time (ACUTE ONLY): 1030 -SLP Stop Time (ACUTE ONLY): 1050 SLP Time Calculation (min) (ACUTE ONLY): 20 min Past Medical History: Past Medical History: Diagnosis Date  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HCC)   Bipolar disorder (HCC)   Complication of anesthesia   Diabetes mellitus without complication (HCC)   Hyperlipidemia   Hypertension   Kidney disease   PONV (postoperative nausea and vomiting)   Sleep apnea   Sleep apnea, unspecified 10/19/2018  Spinal stenosis  Past Surgical History: Past Surgical History: Procedure Laterality Date  ANTERIOR CERVICAL DECOMP/DISCECTOMY FUSION N/A 10/16/2020  Procedure: ACDF - C3-C4 - C4-C5 - C5-C6;  Surgeon: Tia Alert, MD;  Location: [facility] OR;  Service: Neurosurgery;  Laterality: N/A;  IR GASTROSTOMY TUBE MOD SED  11/08/2020  TONSILLECTOMY   HPI: Pt is a 55 y/o male who recently underwent cervical ACDF C3-C6 on 10/3 and discharged home on 10/4.  Patient presented to ER later that day after syncopal episode at home with negative CTH and cervical CT and required scalp laceration repair.  No other complaints, normal ddimer and hemodynamics and therefore discharged home. Pt returned to tje ER again for progressive lethargy at home and was found by EMS  with O2 saturations in the 70's, placed on NRB and no improvement to narcan.  Additionally reported decrease oral intake due to ongoing pain and some difficulty swallowing, CT cervical showed slightly increased prevertebral swelling compared to 10/4 and narrowed AP diameter of the larynx the level of the epiglottis to 5 mm, CTH negative for acute intracranial process, and CTA PE was negative for PE, but showed multifocal pneumonia and thickening of esophageal wall possibly reflecting esophagitis, and hepatomegaly.  Subjective: alert Assessment / Plan / Recommendation CHL IP CLINICAL IMPRESSIONS 11/16/2020 Clinical Impression Patient presents with mild improvement in swallow function as compared to previous two MBS's (10/13, 10/20) with most significant improvement being improved airway protection. He exhibited only one instance of trace aspiration (PAS 8)during the swallow with thin liquids and one instance of penetration without clearance of trace amount of nectar thick liquids (PAS 3). He was able to clear penetrate with cued throat clear and reswallow. Patient continues to exhibit limited laryngeal elevation and hyolaryngeal movement during swallow. With puree solids and nectar thick liquids, swallow was initiated at level of vallecular sinus and with thin liquids, swallow was initiated at level of pyriform sinus. After initial swallow of puree solids, patient had mild-moderate amount of bolus residuals in vallecular and pyriform sinus. With subsequent swallows, small amount of bolus transited through UES and into esophagus each time. Amount  of pharyngeal residuals decreased but not eliminated with thin liquid consistency barium. SLP is recommending to start patient on Clear liquids (thin consistency) diet, continue meds via PEG and anticipate will require majority of nutrition still via PEG as well. Patient has been instructed to sit upright during PO's, swallow 2-3 extra times each sip, take small sips, clear  throat and reswallow intermittently and to consume small amounts overall during each PO intake event. SLP Visit Diagnosis Dysphagia, pharyngeal phase (R13.13) Attention and concentration deficit following -- Frontal lobe and executive function deficit following -- Impact on safety and function Moderate aspiration risk   CHL IP TREATMENT RECOMMENDATION 11/02/2020 Treatment Recommendations Therapy as outlined in treatment plan below   Prognosis 11/16/2020 Prognosis for Safe Diet Advancement Fair Barriers to Reach Goals Severity of deficits Barriers/Prognosis Comment -- CHL IP DIET RECOMMENDATION 11/16/2020 SLP Diet Recommendations Thin liquid;Other (Comment) Liquid Administration via Cup;Straw Medication Administration Via alternative means Compensations Slow rate;Small sips/bites;Minimize environmental distractions;Clear throat intermittently;Multiple dry swallows after each bite/sip Postural Changes Seated upright at 90 degrees   CHL IP OTHER RECOMMENDATIONS 11/16/2020 Recommended Consults -- Oral Care Recommendations Oral care BID Other Recommendations --   CHL IP FOLLOW UP RECOMMENDATIONS 11/06/2020 Follow up Recommendations Inpatient Rehab   CHL IP FREQUENCY AND DURATION 11/02/2020 Speech Therapy Frequency (ACUTE ONLY) min 2x/week Treatment Duration --      CHL IP ORAL PHASE 11/16/2020 Oral Phase WFL Oral - Pudding Teaspoon -- Oral - Pudding Cup -- Oral - Honey Teaspoon -- Oral - Honey Cup -- Oral - Nectar Teaspoon NT Oral - Nectar Cup WFL Oral - Nectar Straw -- Oral - Thin Teaspoon WFL Oral - Thin Cup -- Oral - Thin Straw -- Oral - Puree -- Oral - Mech Soft -- Oral - Regular -- Oral - Multi-Consistency -- Oral - Pill -- Oral Phase - Comment --  CHL IP PHARYNGEAL PHASE 11/16/2020 Pharyngeal Phase Impaired Pharyngeal- Pudding Teaspoon -- Pharyngeal -- Pharyngeal- Pudding Cup -- Pharyngeal -- Pharyngeal- Honey Teaspoon NT Pharyngeal -- Pharyngeal- Honey Cup -- Pharyngeal -- Pharyngeal- Nectar Teaspoon Delayed swallow  initiation-vallecula;Delayed swallow initiation-pyriform sinuses;Reduced anterior laryngeal mobility;Reduced laryngeal elevation;Pharyngeal residue - valleculae;Pharyngeal residue - pyriform Pharyngeal Material does not enter airway Pharyngeal- Nectar Cup Delayed swallow initiation-pyriform sinuses;Delayed swallow initiation-vallecula;Reduced anterior laryngeal mobility;Reduced epiglottic inversion;Reduced laryngeal elevation;Reduced airway/laryngeal closure;Penetration/Aspiration during swallow;Penetration/Apiration after swallow;Pharyngeal residue - valleculae;Pharyngeal residue - pyriform Pharyngeal Material enters airway, remains ABOVE vocal cords then ejected out;Material enters airway, remains ABOVE vocal cords and not ejected out Pharyngeal- Nectar Straw NT Pharyngeal -- Pharyngeal- Thin Teaspoon Delayed swallow initiation-pyriform sinuses;Delayed swallow initiation-vallecula;Reduced laryngeal elevation;Reduced anterior laryngeal mobility;Pharyngeal residue - pyriform;Pharyngeal residue - valleculae Pharyngeal Material does not enter airway Pharyngeal- Thin Cup Reduced anterior laryngeal mobility;Delayed swallow initiation-pyriform sinuses;Delayed swallow initiation-vallecula;Reduced laryngeal elevation;Penetration/Aspiration during swallow;Pharyngeal residue - valleculae;Pharyngeal residue - pyriform;Trace aspiration Pharyngeal Material enters airway, passes BELOW cords without attempt by patient to eject out (silent aspiration) Pharyngeal- Thin Straw NT Pharyngeal -- Pharyngeal- Puree Delayed swallow initiation-vallecula;Reduced anterior laryngeal mobility;Reduced laryngeal elevation;Pharyngeal residue - valleculae;Pharyngeal residue - pyriform Pharyngeal -- Pharyngeal- Mechanical Soft -- Pharyngeal -- Pharyngeal- Regular -- Pharyngeal -- Pharyngeal- Multi-consistency -- Pharyngeal -- Pharyngeal- Pill -- Pharyngeal -- Pharyngeal Comment --  No flowsheet data found. Angela Nevin, MA, CCC-SLP Speech  Therapy              No results for input(s): WBC, HGB, HCT, PLT in the last 72 hours.  No results for input(s): NA, K, CL, CO2, GLUCOSE, BUN, CREATININE, CALCIUM in  the last 72 hours.   Intake/Output Summary (Last 24 hours) at 11/17/2020 1101 Last data filed at 11/16/2020 1750 Gross per 24 hour  Intake 840 ml  Output --  Net 840 ml         Physical Exam: Vital Signs Blood pressure 134/70, pulse 74, temperature 98.7 F (37.1 C), resp. rate 16, height 6' (1.829 m), weight 69.6 kg, SpO2 97 %.   Constitutional: No distress . Vital signs reviewed. HEENT: NCAT, EOMI, oral membranes moist Neck: supple Cardiovascular: RRR without murmur. No JVD    Respiratory/Chest: CTA Bilaterally without wheezes or rales. Normal effort    GI/Abdomen: BS +, non-tender, non-distended Ext: no clubbing, cyanosis, or edema Psych: pleasant and cooperative  Neurological: Ox3, moves all 4's. Fair insight and awareness. Functional memory and language.  Distal sensory loss in LE's.  Skin: post-op neck wound remains CDI Musc: neck with good rom/posture, no spasms appreciated   Assessment/Plan: 1. Functional deficits which require 3+ hours per day of interdisciplinary therapy in a comprehensive inpatient rehab setting. Physiatrist is providing close team supervision and 24 hour management of active medical problems listed below. Physiatrist and rehab team continue to assess barriers to discharge/monitor patient progress toward functional and medical goals  Care Tool:  Bathing    Body parts bathed by patient: Right arm, Left arm, Chest, Abdomen, Front perineal area, Buttocks, Right upper leg, Left upper leg, Right lower leg, Left lower leg, Face         Bathing assist Assist Level: Independent with assistive device     Upper Body Dressing/Undressing Upper body dressing   What is the patient wearing?: Pull over shirt, Orthosis    Upper body assist Assist Level: Independent    Lower Body  Dressing/Undressing Lower body dressing      What is the patient wearing?: Pants, Underwear/pull up     Lower body assist Assist for lower body dressing: Supervision/Verbal cueing     Toileting Toileting    Toileting assist Assist for toileting: Independent     Transfers Chair/bed transfer  Transfers assist     Chair/bed transfer assist level: Supervision/Verbal cueing     Locomotion Ambulation   Ambulation assist      Assist level: Supervision/Verbal cueing Assistive device: No Device Max distance: >200'   Walk 10 feet activity   Assist     Assist level: Supervision/Verbal cueing Assistive device: No Device   Walk 50 feet activity   Assist    Assist level: Supervision/Verbal cueing Assistive device: No Device    Walk 150 feet activity   Assist    Assist level: Supervision/Verbal cueing Assistive device: No Device    Walk 10 feet on uneven surface  activity   Assist Walk 10 feet on uneven surfaces activity did not occur: Safety/medical concerns   Assist level: Supervision/Verbal cueing     Wheelchair     Assist Is the patient using a wheelchair?: No             Wheelchair 50 feet with 2 turns activity    Assist            Wheelchair 150 feet activity     Assist          Blood pressure 134/70, pulse 74, temperature 98.7 F (37.1 C), resp. rate 16, height 6' (1.829 m), weight 69.6 kg, SpO2 97 %.  Medical Problem List and Plan: 1.  Debility/syncope/altered mental status secondary to acute hypoxic respiratory failure in the setting of multifocal  pneumonia with history of OSA.  Antibiotic course of Unasyn completed.             -patient may shower but PEG site must be covered             -ELOS/Goals: 11/5            -finalize dc planning  -f/u with surgery, primary, [facility]  2.  Antithrombotics: -DVT/anticoagulation:  Pharmaceutical: Lovenox.  CT angiogram of chest negative for pulmonary emboli              -antiplatelet therapy: N/A 3. Pain Management: hydrocodone as needed.    11/4  continue hs tizanidine for sleep, cervicalgia/spasm with improvement 4. Mood/bipolar/schizoaffective disorder: Prozac 40 mg daily, (Wellbutrin 100 mg twice daily) valproic acid 500 mg 3 times daily, Ativan 0.5 mg every 8 hours as needed             -antipsychotic agents: HS and prn haldol  -zyprexa stopped due to hyperglycemia  10/29- restarted Ativan at- his home dose  11/4 vyvanse per home regimen 5. Neuropsych: This patient is not capable of making decisions on his own behalf. 6. Skin/Wound Care: Routine skin checks 7. Fluids/Electrolytes/Nutrition: Routine in and outs with follow-up chemistries 8.  Diabetes mellitus.  Hemoglobin A1c 8.0.  Presently on Semglee 20 units twice daily.  Patient on Jardiance 10 mg daily, Glucophage 2000 mg daily, Glucotrol 5 mg daily prior to admission as well as Trulicity 3 mg weekly.  Resume as needed  10/31 cbg's improving off zyprexa---observe for now 9.  Dysphagia.  Currently with nasogastric tube feeds transitioning to PEG tube per interventional radiology completed 11/08/2020.  Follow-up speech therapy  11/3 pt cleared for clear thin liquids only  11/4-at discharge, pt will need to continue to take meds thru PEG and receive TF thru PEG for nourishment. He can take in as many clear liquids as he's able to. Advance diet as outpt.  10.  Hypertension.  Cozaar 50 mg and Lopressor currently on hold due to BP being quite soft.--controlled 11/1 Hyperlipidemia.  Crestor 11. Urinary retention: scheduled toileting, continent 12. OSA on CPAP. has intermittent apneic spells--   -continue to monitor sats and VSS  -pt asked if he could speak with RT re: facemask options  LOS: 7 days A FACE TO FACE EVALUATION WAS PERFORMED  Ranelle Oyster 11/17/2020, 11:01 AM

## 2020-11-17 NOTE — Progress Notes (Signed)
Physical Therapy Discharge Summary  Patient Details  Name: Butler Vegh MRN: [id number] Date of Birth: [birth year]   Patient has met 7 of 7 long term goals due to improved activity tolerance, improved balance, increased strength, decreased pain, and improved coordination.  Patient to discharge at an ambulatory level Supervision.   Patient's care partner is independent to provide the necessary physical assistance at discharge.  Recommendation:  Patient will benefit from ongoing skilled PT services in home health setting to continue to advance safe functional mobility, address ongoing impairments in balance, selective attention and minimize fall risk.  Equipment: No equipment provided  Reasons for discharge: treatment goals met and discharge from hospital  Patient/family agrees with progress made and goals achieved: Yes  PT Discharge Precautions/Restrictions Precautions Precautions: Fall;Cervical Precaution Booklet Issued: No Precaution Comments: PEG Required Braces or Orthoses: Cervical Brace Cervical Brace: Soft collar;For comfort Restrictions Weight Bearing Restrictions: No Pain Interference Pain Interference Pain Effect on Sleep: 2. Occasionally Pain Interference with Therapy Activities: 2. Occasionally Pain Interference with Day-to-Day Activities: 1. Rarely or not at all Vision/Perception  Vision - History Ability to See in Adequate Light: 0 Adequate Perception Perception: Within Functional Limits Praxis Praxis: Intact  Cognition Overall Cognitive Status: History of cognitive impairments - at baseline Arousal/Alertness: Awake/alert Orientation Level: Oriented X4 Safety/Judgment: Impaired Comments: Pt demonstrates impulsive behavior at times - requires cues to avoid LOB Sensation Sensation Light Touch: Impaired Detail Peripheral sensation comments: Impaired sensation in LLE distal to knee, hx of neuropathy Light Touch Impaired Details: Impaired  LLE Coordination Gross Motor Movements are Fluid and Coordinated: No Coordination and Movement Description: Significant gait deviations (R >L) Finger Nose Finger Test: Dysmetria bilaterally (R >L) Heel Shin Test: [facility] Motor  Motor Motor: Within Functional Limits Motor - Discharge Observations: Affected by pain and global deconditioning  Mobility Bed Mobility Bed Mobility: Rolling Right;Rolling Left;Supine to Sit;Sit to Supine;Scooting to [facility] Rolling Right: Independent with assistive device Rolling Left: Independent with assistive device Supine to Sit: Independent Sit to Supine: Independent Scooting to HOB: Independent Transfers Transfers: Sit to Stand;Stand to Sit;Stand Pivot Transfers Sit to Stand: Supervision/Verbal cueing Stand to Sit: Supervision/Verbal cueing Stand Pivot Transfers: Supervision/Verbal cueing Stand Pivot Transfer Details: Verbal cues for precautions/safety Transfer (Assistive device): None Locomotion  Gait Ambulation: Yes Gait Assistance: Supervision/Verbal cueing Gait Distance (Feet): 500 Feet Assistive device: None Gait Assistance Details: Verbal cues for precautions/safety Gait Gait: Yes Gait Pattern: Impaired Gait Pattern: Wide base of support;Left flexed knee in stance;Right flexed knee in stance Gait velocity: decreased High Level Ambulation High Level Ambulation: Backwards walking;Sudden stops;Head turns Backwards Walking: Very slow, CGA Sudden Stops: CGA Head Turns: Significant LOB when turned to R side, CGA-min A Stairs / Additional Locomotion Stairs: Yes Stairs Assistance: Supervision/Verbal cueing Stair Management Technique: Two rails Number of Stairs: 12 Height of Stairs: 6 Ramp: Supervision/Verbal cueing Curb: Supervision/Verbal cueing Wheelchair Mobility Wheelchair Mobility: No  Trunk/Postural Assessment  Cervical Assessment Cervical Assessment: Exceptions to [facility] (Cervical precautions) Thoracic Assessment Thoracic Assessment:  Within Functional Limits Lumbar Assessment Lumbar Assessment: Within Functional Limits Postural Control Postural Control: Within Functional Limits  Balance Balance Balance Assessed: Yes Static Sitting Balance Static Sitting - Balance Support: Feet supported Static Sitting - Level of Assistance: 7: Independent Dynamic Sitting Balance Dynamic Sitting - Balance Support: During functional activity;Feet supported Dynamic Sitting - Level of Assistance: 7: Independent Dynamic Sitting - Balance Activities: Lateral lean/weight shifting;Forward lean/weight shifting;Reaching for objects Static Standing Balance Static Standing - Balance Support: During functional activity;No upper extremity  supported Static Standing - Level of Assistance: 5: Stand by assistance Dynamic Standing Balance Dynamic Standing - Balance Support: During functional activity;No upper extremity supported Dynamic Standing - Level of Assistance: 5: Stand by assistance Dynamic Standing - Balance Activities: Forward lean/weight shifting;Lateral lean/weight shifting;Reaching for objects Extremity Assessment  RLE Assessment RLE Assessment: Exceptions to [facility] RLE Strength RLE Overall Strength: Within Functional Limits for tasks assessed Right Hip Flexion: 4+/5 Right Hip Extension: 4+/5 Right Hip ABduction: 5/5 Right Hip ADduction: 5/5 Right Knee Flexion: 5/5 Right Knee Extension: 5/5 Right Ankle Dorsiflexion: 5/5 Right Ankle Plantar Flexion: 5/5 LLE Assessment LLE Assessment: Exceptions to [facility] LLE Strength LLE Overall Strength: Within Functional Limits for tasks assessed Left Hip Flexion: 4+/5 Left Hip Extension: 4+/5 Left Hip ABduction: 5/5 Left Hip ADduction: 5/5 Left Knee Flexion: 5/5 Left Knee Extension: 5/5 Left Ankle Dorsiflexion: 5/5 Left Ankle Plantar Flexion: 5/5   Neville Walston E Charlii Yost, PT, DPT 11/17/2020, 9:41 AM
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
## 2020-11-17 NOTE — Progress Notes (Signed)
I received the order to speak w/ pt re: CPAP mask options.  Per pt, his main concern is that he say he's not getting his Ambien and per pt "no ambien, = no cpap", pt states he can't tolerate CPAP without getting Ambien.  Pt is wondering if he continues to need CPAP, but then stated that he has "central and obstructive sleep apnea".  I told him that given that diagnosis he should continue to try to wear CPAP.  He has his home machine in the room,  our hospital masks won't fit on his home machine.  Pt also states he's wondering if his home CPAP machine is on the correct settings.  In the hospital, we are unable/not allowed to change his home CPAP settings.  I offered for him to use our machine, however pt states  he's going home tomorrow and currently does not want to wear our machine.  I spoke w/ RN and she is aware of the above, and message was sent to ordering provider.

## 2020-11-17 NOTE — Progress Notes (Signed)
Physical Therapy Session Note  Patient Details  Name: Edward Garner MRN: [id number] Date of Birth: 17-Sep-1965  Today's Date: 11/17/2020 PT Individual Time: 1660-6301 PT Individual Time Calculation (min): 69 min   Short Term Goals: Week 1:  PT Short Term Goal 1 (Week 1): =LTGs d/t ELOS  Skilled Therapeutic Interventions/Progress Updates:  Pt received supine in bed, denied pain at rest and was agreeable to PT. Emphasis of session on preparing for DC home, improved activity tolerance and BUE strength. Pt performed bed mobility Independently and donned abdominal binder, shoes and cervical collar independently. Pt performed sit <> stands throughout session w/distant S*. Pt ambulated 100' to day gym w/distant S*, noted decreased R path deviations compared to previous sessions. Pt performed 15 minutes on NuStep level 7 for dynamic cardiovascular warmup and UE/LE coordination.   The following were performed at edge of mat for improved UE strength per pt request:  -Superset of bicep curls w/contralateral isometric hold at 90 degrees and hammer curls w/contralateral isometric hold at 90 degrees, 2x12 per side w/7# Dbs  -Superset of overhead press and front raises w/2# dowel, 2x15   Pt ambulated >200' to ortho gym w/S*, noted one minor LOB episode to L side due to pt impulsively turning into wall and abruptly stopping, pt was able to self-correct. Pt demonstrates difficulty w/spatial awareness, selective attention and dual-tasking. Pt performed car transfer w/S* and ascended/descended ramp w/S*. Pt performed 10 minutes on SciFit level 3, 5 min fwd and 5 min retro, for improved B shoulder ROM.   Pt ambulated 30' w/S* and performed the following high-level balance skills along green line in hallway w/HHA for improved dynamic stability:  -Toe walking down and back x2  -Heel walking down and back x1   Pt ambulated >100' back to room w/S* and performed bed mobility independently. Pt was left supine in  bed, all needs in reach.   Therapy Documentation Precautions:  Precautions Precautions: Fall Precaution Booklet Issued: No Precaution Comments: PEG Required Braces or Orthoses: Cervical Brace Cervical Brace: Soft collar, For comfort Restrictions Weight Bearing Restrictions: No   Therapy/Group: Individual Therapy Jill Alexanders Kashlynn Kundert, PT, DPT  11/17/2020, 7:41 AM

## 2020-11-17 NOTE — Plan of Care (Signed)
  Problem: RH Balance Goal: LTG Patient will maintain dynamic standing balance (PT) Description: LTG:  Patient will maintain dynamic standing balance with assistance during mobility activities (PT) Outcome: Completed/Met   Problem: Sit to Stand Goal: LTG:  Patient will perform sit to stand with assistance level (PT) Description: LTG:  Patient will perform sit to stand with assistance level (PT) Outcome: Completed/Met   Problem: RH Bed to Chair Transfers Goal: LTG Patient will perform bed/chair transfers w/assist (PT) Description: LTG: Patient will perform bed to chair transfers with assistance (PT). Outcome: Completed/Met   Problem: RH Car Transfers Goal: LTG Patient will perform car transfers with assist (PT) Description: LTG: Patient will perform car transfers with assistance (PT). Outcome: Completed/Met   Problem: RH Ambulation Goal: LTG Patient will ambulate in controlled environment (PT) Description: LTG: Patient will ambulate in a controlled environment, # of feet with assistance (PT). Outcome: Completed/Met Goal: LTG Patient will ambulate in home environment (PT) Description: LTG: Patient will ambulate in home environment, # of feet with assistance (PT). Outcome: Completed/Met   Problem: RH Stairs Goal: LTG Patient will ambulate up and down stairs w/assist (PT) Description: LTG: Patient will ambulate up and down # of stairs with assistance (PT) Outcome: Completed/Met

## 2020-11-18 LAB — GLUCOSE, CAPILLARY
Glucose-Capillary: 134 mg/dL — ABNORMAL HIGH (ref 70–99)
Glucose-Capillary: 264 mg/dL — ABNORMAL HIGH (ref 70–99)
Glucose-Capillary: 68 mg/dL — ABNORMAL LOW (ref 70–99)
Glucose-Capillary: 89 mg/dL (ref 70–99)

## 2020-11-18 NOTE — Progress Notes (Signed)
Hypoglycemic Event  CBG: 68  Treatment: 8 oz juice/soda  Symptoms: Shaky  Follow-up CBG: Time:0054 CBG Result:89  Possible Reasons for Event: Unknown  Comments/MD notified:    April Holding

## 2020-11-18 NOTE — Progress Notes (Signed)
Inpatient Rehabilitation Discharge Medication Review by a Pharmacist  A complete drug regimen review was completed for this patient to identify any potential clinically significant medication issues.  High Risk Drug Classes Is patient taking? Indication by Medication  Antipsychotic Yes Haloperidol prn for agitation  Anticoagulant No   Antibiotic No   Opioid Yes Vicodin for pain  Antiplatelet No   Hypoglycemics/insulin Yes Insulin glargine for DM  Vasoactive Medication No   Chemotherapy No   Other Yes Fluoxetine, Lisdexamfetamine, Valproic acid, Lorazepam for mood disorder Zanaflex for sleep/muscle spasms     Type of Medication Issue Identified Description of Issue Recommendation(s)  Drug Interaction(s) (clinically significant)     Duplicate Therapy     Allergy     No Medication Administration End Date     Incorrect Dose     Additional Drug Therapy Needed     Significant med changes from prior encounter (inform family/care partners about these prior to discharge).    Other       Clinically significant medication issues were identified that warrant physician communication and completion of prescribed/recommended actions by midnight of the next day:  No  Pharmacist comments: New insulin start - po DM meds stopped/on Trulicity prior to admission/on Semglee while inpatient, on Flomax PTA-no per tube formulation available.  Time spent performing this drug regimen review (minutes):  20 minutes  Jerrilyn Cairo, PharmD PGY1 Pharmacy Resident Phone [phone] 11/18/2020 7:23 AM   Please check AMION for all [facility] Pharmacy phone numbers After 10:00 PM, call Main Pharmacy [phone]

## 2020-11-20 ENCOUNTER — Telehealth (HOSPITAL_COMMUNITY): Payer: Self-pay

## 2020-11-20 ENCOUNTER — Other Ambulatory Visit (HOSPITAL_COMMUNITY): Payer: Self-pay | Admitting: Interventional Radiology

## 2020-11-20 DIAGNOSIS — Z431 Encounter for attention to gastrostomy: Secondary | ICD-10-CM

## 2020-11-20 NOTE — Telephone Encounter (Signed)
Called to schedule peg replacement, no answer, left vm. AW  

## 2020-11-20 NOTE — Telephone Encounter (Signed)
Spoke to pt's mom. A home health nurse is coming today to take a look at the peg. Mother will call back if they need to come in. AW

## 2020-11-22 ENCOUNTER — Other Ambulatory Visit: Payer: Self-pay

## 2020-11-22 ENCOUNTER — Telehealth: Payer: Self-pay

## 2020-11-22 ENCOUNTER — Other Ambulatory Visit: Payer: Self-pay | Admitting: Family Medicine

## 2020-11-22 ENCOUNTER — Ambulatory Visit
Admission: RE | Admit: 2020-11-22 | Discharge: 2020-11-22 | Disposition: A | Discharge status: Home / Self Care | Payer: Medicare Other | Source: Ambulatory Visit | Attending: Family Medicine | Admitting: Family Medicine

## 2020-11-22 DIAGNOSIS — R058 Other specified cough: Secondary | ICD-10-CM

## 2020-11-22 NOTE — Telephone Encounter (Signed)
[name], PT from [facility] called to get verbal orders for patient to have PT once a week for 6 weeks, including generalized strengthening, balance training and gait training. Verbal orders given

## 2020-11-22 NOTE — Telephone Encounter (Signed)
Theora Gianotti, ST from [facility] request HHST orders. Patient is not coming back for a f/u from being discharged. I called Theora Gianotti and advised her to call PCP for orders.

## 2020-11-29 ENCOUNTER — Other Ambulatory Visit (HOSPITAL_COMMUNITY): Payer: Self-pay

## 2020-11-29 DIAGNOSIS — E785 Hyperlipidemia, unspecified: Secondary | ICD-10-CM

## 2020-11-29 DIAGNOSIS — N289 Disorder of kidney and ureter, unspecified: Secondary | ICD-10-CM

## 2020-11-29 DIAGNOSIS — Z794 Long term (current) use of insulin: Secondary | ICD-10-CM

## 2020-11-29 DIAGNOSIS — R1313 Dysphagia, pharyngeal phase: Secondary | ICD-10-CM

## 2020-11-29 DIAGNOSIS — Z87891 Personal history of nicotine dependence: Secondary | ICD-10-CM

## 2020-11-29 DIAGNOSIS — M4802 Spinal stenosis, cervical region: Secondary | ICD-10-CM

## 2020-11-29 DIAGNOSIS — F319 Bipolar disorder, unspecified: Secondary | ICD-10-CM

## 2020-11-29 DIAGNOSIS — Z9181 History of falling: Secondary | ICD-10-CM

## 2020-11-29 DIAGNOSIS — E279 Disorder of adrenal gland, unspecified: Secondary | ICD-10-CM

## 2020-11-29 DIAGNOSIS — E119 Type 2 diabetes mellitus without complications: Secondary | ICD-10-CM

## 2020-11-29 DIAGNOSIS — G4700 Insomnia, unspecified: Secondary | ICD-10-CM

## 2020-11-29 DIAGNOSIS — Z7985 Long-term (current) use of injectable non-insulin antidiabetic drugs: Secondary | ICD-10-CM

## 2020-11-29 DIAGNOSIS — Z7984 Long term (current) use of oral hypoglycemic drugs: Secondary | ICD-10-CM

## 2020-11-29 DIAGNOSIS — M4712 Other spondylosis with myelopathy, cervical region: Secondary | ICD-10-CM

## 2020-11-29 DIAGNOSIS — Z431 Encounter for attention to gastrostomy: Secondary | ICD-10-CM

## 2020-11-29 DIAGNOSIS — Z79891 Long term (current) use of opiate analgesic: Secondary | ICD-10-CM

## 2020-11-29 DIAGNOSIS — G4733 Obstructive sleep apnea (adult) (pediatric): Secondary | ICD-10-CM

## 2020-11-29 DIAGNOSIS — F259 Schizoaffective disorder, unspecified: Secondary | ICD-10-CM

## 2020-11-29 DIAGNOSIS — Z8701 Personal history of pneumonia (recurrent): Secondary | ICD-10-CM

## 2020-11-29 DIAGNOSIS — R339 Retention of urine, unspecified: Secondary | ICD-10-CM

## 2020-11-29 DIAGNOSIS — I10 Essential (primary) hypertension: Secondary | ICD-10-CM

## 2020-12-20 ENCOUNTER — Telehealth: Payer: Self-pay

## 2020-12-20 NOTE — Telephone Encounter (Signed)
Edward Garner, Edward Garner from [facility] called requesting verbal orders for HHST 2wk2, 0wk1, 1wk1 beginning Dec. 11. I called Edward Garner back and informed her that order approved for these session but if extended the order would need to come from PCP as patient does not have a f/u with [facility].

## 2020-12-21 ENCOUNTER — Telehealth: Payer: Self-pay

## 2020-12-21 NOTE — Telephone Encounter (Signed)
Chante the Physical Therapist with [facility] discharged Edward Garner  one week early. Patient met all his Physical Therapy Goals. He will continue to received all other in-home therapies.  A message was left on his voicemail to call providers for his hospital follow ups. As directed by his discharge paperwork.

## 2021-01-15 ENCOUNTER — Other Ambulatory Visit (HOSPITAL_COMMUNITY): Payer: Self-pay

## 2021-01-23 ENCOUNTER — Encounter: Payer: Self-pay | Admitting: Pulmonary Disease

## 2021-01-23 ENCOUNTER — Other Ambulatory Visit: Payer: Self-pay

## 2021-01-23 ENCOUNTER — Ambulatory Visit (INDEPENDENT_AMBULATORY_CARE_PROVIDER_SITE_OTHER): Payer: Medicare Other | Admitting: Pulmonary Disease

## 2021-01-23 VITALS — BP 120/70 | HR 102 | Temp 98.1°F | Ht 72.0 in | Wt 169.8 lb

## 2021-01-23 DIAGNOSIS — Z9989 Dependence on other enabling machines and devices: Secondary | ICD-10-CM

## 2021-01-23 DIAGNOSIS — G4733 Obstructive sleep apnea (adult) (pediatric): Secondary | ICD-10-CM

## 2021-01-23 NOTE — Progress Notes (Signed)
Subjective:     Patient ID: Edward Garner, male   DOB: July 13, 1965, 56 y.o.   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  HENT:  Positive for dental problem. Negative for congestion, ear pain, nosebleeds, postnasal drip, rhinorrhea, sinus pressure, sneezing, sore throat and trouble swallowing.   Eyes:  Negative for redness and itching.  Respiratory:  Negative for cough, chest tightness, shortness of breath and wheezing.   Cardiovascular: Negative.   Gastrointestinal: Negative.   Genitourinary: Negative.   Musculoskeletal:  Positive for joint swelling.  Skin: Negative.   Allergic/Immunologic: Positive for environmental allergies. Negative for food allergies and immunocompromised state.  Neurological: Negative.   Hematological: Negative.   Psychiatric/Behavioral:  Negative for dysphoric mood. The patient is  nervous/anxious.   Quit smoking in 1996  Family history of heart disease,  He is disabled  Personal history of hypertension Diabetes Hypercholesterolemia He has a lone kidney Seizure disorder    Objective:   Physical Exam Vitals reviewed.  Constitutional:      Appearance: Normal appearance.  HENT:     Head: Normocephalic.     Mouth/Throat:     Mouth: Mucous membranes are moist.  Eyes:     Extraocular Movements: Extraocular movements intact.     Pupils: Pupils are equal, round, and reactive to light.  Cardiovascular:     Rate and Rhythm: Normal rate and regular rhythm.     Heart sounds: No murmur heard.   No friction rub.  Pulmonary:     Effort: Pulmonary effort is normal. No respiratory distress.     Breath sounds: Normal breath sounds. No stridor. No wheezing.  Abdominal:     General: Abdomen is flat.  Musculoskeletal:     Cervical back: No rigidity.  Neurological:     Mental Status: He is alert.  Psychiatric:        Mood and Affect: Mood normal.   Results of the Epworth flowsheet 09/28/2018  Sitting and reading 2  Watching TV 1  Sitting, inactive in a public place (e.g. 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

## 2021-01-23 NOTE — Patient Instructions (Signed)
Schedule for in lab polysomnogram to assess for presence of sleep apnea  Optimize sleep hygiene-try and get 6 to 8 hours of sleep  We will call you to discuss findings on the sleep study as soon as reviewed  Tentative follow-up in 6 months

## 2021-01-25 ENCOUNTER — Other Ambulatory Visit (HOSPITAL_COMMUNITY): Payer: Self-pay | Admitting: Family Medicine

## 2021-01-25 DIAGNOSIS — Z931 Gastrostomy status: Secondary | ICD-10-CM

## 2021-01-26 ENCOUNTER — Other Ambulatory Visit (HOSPITAL_COMMUNITY): Payer: Self-pay | Admitting: *Deleted

## 2021-01-26 ENCOUNTER — Other Ambulatory Visit (HOSPITAL_COMMUNITY): Payer: Self-pay

## 2021-01-26 ENCOUNTER — Telehealth (HOSPITAL_COMMUNITY): Payer: Self-pay

## 2021-01-26 DIAGNOSIS — R059 Cough, unspecified: Secondary | ICD-10-CM

## 2021-01-26 DIAGNOSIS — R1310 Dysphagia, unspecified: Secondary | ICD-10-CM

## 2021-01-26 NOTE — Telephone Encounter (Signed)
Attempted to contact patient to schedule OP MBS - left voicemail. ?

## 2021-01-29 ENCOUNTER — Other Ambulatory Visit: Payer: Self-pay

## 2021-01-29 ENCOUNTER — Ambulatory Visit (HOSPITAL_COMMUNITY)
Admission: RE | Admit: 2021-01-29 | Discharge: 2021-01-29 | Disposition: A | Discharge status: Home / Self Care | Payer: Medicare Other | Source: Ambulatory Visit | Attending: Family Medicine | Admitting: Family Medicine

## 2021-01-29 DIAGNOSIS — Z431 Encounter for attention to gastrostomy: Secondary | ICD-10-CM | POA: Diagnosis not present

## 2021-01-29 DIAGNOSIS — Z931 Gastrostomy status: Secondary | ICD-10-CM

## 2021-01-29 HISTORY — PX: IR GASTROSTOMY TUBE REMOVAL: IMG5492

## 2021-01-29 MED ORDER — LIDOCAINE VISCOUS HCL 2 % MT SOLN
OROMUCOSAL | Status: DC | PRN
  Administered 2021-01-29: 15 mL via OROMUCOSAL

## 2021-01-29 MED ORDER — LIDOCAINE VISCOUS HCL 2 % MT SOLN
OROMUCOSAL | Status: AC
  Filled 2021-01-29: qty 15

## 2021-01-29 NOTE — Procedures (Signed)
°  Successful bedside removal of intact pull through G-tube. No immediate post procedural complications. Gauze dressing placed over site.  Pt provided with care instructions and verbalized understanding of care.  EBL <1cc    Alex Gardener, AGNP-BC 01/29/2021, 2:22 PM

## 2021-01-30 ENCOUNTER — Other Ambulatory Visit (HOSPITAL_COMMUNITY): Payer: Self-pay

## 2021-02-02 ENCOUNTER — Ambulatory Visit (HOSPITAL_COMMUNITY)
Admission: RE | Admit: 2021-02-02 | Discharge: 2021-02-02 | Disposition: A | Discharge status: Home / Self Care | Payer: Medicare Other | Source: Ambulatory Visit | Attending: Family Medicine | Admitting: Family Medicine

## 2021-02-02 ENCOUNTER — Other Ambulatory Visit: Payer: Self-pay

## 2021-02-02 DIAGNOSIS — R1313 Dysphagia, pharyngeal phase: Secondary | ICD-10-CM | POA: Insufficient documentation

## 2021-02-02 DIAGNOSIS — R059 Cough, unspecified: Secondary | ICD-10-CM

## 2021-02-02 DIAGNOSIS — R1310 Dysphagia, unspecified: Secondary | ICD-10-CM | POA: Diagnosis not present

## 2021-02-02 NOTE — Progress Notes (Signed)
Modified Barium Swallow Progress Note  Patient Details  Name: Edward Garner MRN: [id number] Date of Birth: 24-Jul-1965  Today's Date: 02/02/2021  Modified Barium Swallow completed.  Full report located under Chart Review in the Imaging Section.  Brief recommendations include the following:  Clinical Impression  Pt presents with pharyngeal dysphagia which is improved compared to the MBS on 11/16/20. He continues to demonstrate reduced anterior laryngeal movement, reduced base of tongue retraction, and reduced pharyngeal constriction, but hyolaryngeal movement is improved and improved PES distension was noted. He exhibited vallecular residue and pyriform sinus residue which increased with bolus size, and incomplete epiglottic inversion was intermittently observed. Penetration (PAS 2, 3) and subsequent trace silent aspiration (PAS 8) were inconsistently noted with thin liquids. Laryngeal invasion was most significant with use of a straw and when a chin tuck posture was used. Frequency and severity of laryngeal invasion were improved with a left head turn, and inconsistently with an effortful swallow. Prompted coughing was effective in expelling penetrate and aspirate of thin liquids. The 89mm barium tablet became lodged in the valleculae and was resistant to movement despite multiple additional boluses of thin liquids. Transport was ultimately facilitated with use of cued effortful swallows with consecutive swallows of nectar thick liquids via straw. It is noteworthy that the pt consistently denied sensation of the barium tablet despite the level of effort necessary to facilitate pharyngeal clearance. Pt may continue the diet which he has described (i.e., dysphagia 3 with ground meats and thin liquids). Swallowing precautions are recommended to reduce risk of dysphagia-related adverse events, and continued SLP services are clinically indicated to facilite further improvement.   Swallow Evaluation  Recommendations       SLP Diet Recommendations: Dysphagia 3 (Mech soft) solids;Thin liquid   Liquid Administration via: Cup;No straw   Medication Administration: Crushed with puree   Supervision: Patient able to self feed   Compensations: Slow rate;Small sips/bites;Follow solids with liquid;Hard cough after swallow;Multiple dry swallows after each bite/sip (left head turn)   Postural Changes: Seated upright at 90 degrees   Oral Care Recommendations: Oral care BID     Alphia Behanna I. Vear Clock, MS, CCC-SLP Acute Rehabilitation Services Office number [phone] Pager [phone]    Scheryl Marten 02/02/2021,3:35 PM

## 2021-02-19 DIAGNOSIS — G4733 Obstructive sleep apnea (adult) (pediatric): Secondary | ICD-10-CM | POA: Diagnosis not present

## 2021-02-21 ENCOUNTER — Other Ambulatory Visit: Payer: Self-pay

## 2021-02-21 ENCOUNTER — Ambulatory Visit (HOSPITAL_BASED_OUTPATIENT_CLINIC_OR_DEPARTMENT_OTHER): Payer: Medicare Other | Attending: Pulmonary Disease | Admitting: Pulmonary Disease

## 2021-02-21 DIAGNOSIS — E119 Type 2 diabetes mellitus without complications: Secondary | ICD-10-CM | POA: Insufficient documentation

## 2021-02-21 DIAGNOSIS — R519 Headache, unspecified: Secondary | ICD-10-CM | POA: Diagnosis not present

## 2021-02-21 DIAGNOSIS — Z9989 Dependence on other enabling machines and devices: Secondary | ICD-10-CM | POA: Diagnosis not present

## 2021-02-21 DIAGNOSIS — I493 Ventricular premature depolarization: Secondary | ICD-10-CM | POA: Insufficient documentation

## 2021-02-21 DIAGNOSIS — R0683 Snoring: Secondary | ICD-10-CM | POA: Insufficient documentation

## 2021-02-21 DIAGNOSIS — R5383 Other fatigue: Secondary | ICD-10-CM | POA: Insufficient documentation

## 2021-02-21 DIAGNOSIS — G4733 Obstructive sleep apnea (adult) (pediatric): Secondary | ICD-10-CM | POA: Diagnosis present

## 2021-02-25 ENCOUNTER — Encounter (HOSPITAL_BASED_OUTPATIENT_CLINIC_OR_DEPARTMENT_OTHER): Payer: Medicare Other | Admitting: Pulmonary Disease

## 2021-02-27 ENCOUNTER — Telehealth: Payer: Self-pay | Admitting: Pulmonary Disease

## 2021-02-27 NOTE — Telephone Encounter (Signed)
Call patient  Sleep study result  Date of study: 02/21/2021  Impression: Moderate obstructive sleep apnea Mild oxygen desaturations  Recommendation: DME referral  Recommend CPAP therapy for moderate obstructive sleep apnea  Auto titrating CPAP with pressure settings of 5-15 will be appropriate  Encourage weight loss measures  Follow-up in the office 4 to 6 weeks following initiation of treatment

## 2021-02-27 NOTE — Procedures (Signed)
POLYSOMNOGRAPHY  Last, First: Martino, Bajor MRN: [id number] Gender: Male Age (years): 56 Weight (lbs): 165 DOB: 1965/08/01 BMI: 22 Primary Care: No PCP Epworth Score: 10 Referring: Tomma Lightning MD Technician: Shelah Lewandowsky Interpreting: Tomma Lightning MD Study Type: NPSG Ordered Study Type: NPSG Study date: 02/21/2021 Location: [facility] CLINICAL INFORMATION Edward Garner is a 56 year old Male and was referred to the sleep center for evaluation of G47.30 Sleep Apnea, Unspecified (780.57). Indications include Diabetes, Fatigue, Morning Headaches, Re-Evaluation, Snoring.   Most recent polysomnogram dated 10/19/2018 revealed an AHI of 19.2/h and RDI of 25.6/h. Most recent titration study dated 10/19/2018 was optimal at 12cm H2O with an AHI of 13.8/h. MEDICATIONS Patient self administered medications include: DEPAKOTE ER, GLIMEPIRIDE, HYDROCODONE, LYRICA, LORAZEPAM, DEPAKOTE. Medications administered during study include No sleep medicine administered.  SLEEP STUDY TECHNIQUE A multi-channel overnight Polysomnography study was performed. The channels recorded and monitored were central and occipital EEG, electrooculogram (EOG), submentalis EMG (chin), nasal and oral airflow, thoracic and abdominal wall motion, anterior tibialis EMG, snore microphone, electrocardiogram, and a pulse oximetry. TECHNICIAN COMMENTS Comments added by Technician: Patient had difficulty initiating sleep. Patient was restless all through the night. Comments added by Scorer: N/A SLEEP ARCHITECTURE The study was initiated at 10:08:20 PM and terminated at 4:48:42 AM. The total recorded time was 400.4 minutes. EEG confirmed total sleep time was 134 minutes yielding a sleep efficiency of 33.5%%. Sleep onset after lights out was 163.3 minutes with a REM latency of N/A minutes. The patient spent 21.6%% of the night in stage N1 sleep, 78.4%% in stage N2 sleep, 0.0%% in stage N3 and 0% in REM. Wake after sleep  onset (WASO) was 103.1 minutes. The Arousal Index was 31.8/hour. RESPIRATORY PARAMETERS There were a total of 44 respiratory disturbances out of which 0 were apneas ( 0 obstructive, 0 mixed, 0 central) and 44 hypopneas. The apnea/hypopnea index (AHI) was 19.7 events/hour. The central sleep apnea index was 0 events/hour. The REM AHI was N/A events/hour and NREM AHI was 19.7 events/hour. The supine AHI was 19.7 events/hour and the non supine AHI was 0 supine during 100.00% of sleep. Respiratory disturbances were associated with oxygen desaturation down to a nadir of 88.0% during sleep. The mean oxygen saturation during the study was 95.4%. The cumulative time under 88% oxygen saturation was 5.5 minutes.  LEG MOVEMENT DATA The total leg movements were 20 with a resulting leg movement index of 9.0/hr .Associated arousal with leg movement index was 0.0/hr.  CARDIAC DATA The underlying cardiac rhythm was most consistent with sinus rhythm. Mean heart rate during sleep was 79.9 bpm. Additional rhythm abnormalities include PVCs.  IMPRESSIONS - Moderate Obstructive Sleep apnea(OSA) - Electrocardiographic data showed presence of PVCs. - Mild Oxygen Desaturation - The patient snored with moderate snoring volume. - No significant periodic leg movements(PLMs) during sleep. However, no significant associated arousals.  DIAGNOSIS - Obstructive Sleep Apnea (G47.33) - Nocturnal Hypoxemia (G47.36)  RECOMMENDATIONS - Therapeutic CPAP titration to determine optimal pressure required to alleviate sleep disordered breathing. - Auto titrating CPAP may be considered as option of treatment, CPAP settings of 5-15 with heated humidification, with patients mask of choice. - Positional therapy avoiding supine position during sleep. - Avoid alcohol, sedatives and other CNS depressants that may worsen sleep apnea and disrupt normal sleep architecture. - Sleep hygiene should be reviewed to assess factors that may improve  sleep quality. - Weight management and regular exercise should be initiated or continued.  [Electronically signed] 02/27/2021 06:27 AM  Sherrilyn Rist MD NPI: [id number]

## 2021-02-28 NOTE — Telephone Encounter (Signed)
I called the patient and gave him the results and he wants to think about the CPAP and get back with [place]. Closing encounter.

## 2021-04-03 ENCOUNTER — Other Ambulatory Visit: Payer: Self-pay

## 2021-04-03 ENCOUNTER — Ambulatory Visit: Payer: Medicare Other | Attending: Family Medicine

## 2021-04-03 DIAGNOSIS — R41841 Cognitive communication deficit: Secondary | ICD-10-CM | POA: Insufficient documentation

## 2021-04-03 DIAGNOSIS — R1313 Dysphagia, pharyngeal phase: Secondary | ICD-10-CM | POA: Insufficient documentation

## 2021-04-04 NOTE — Patient Instructions (Signed)
?  Please bring in the set of exercises Theora Gianotti developed for you and the device that you put under your chin ?

## 2021-04-04 NOTE — Therapy (Signed)
[facility] ?[address], [address] ?[place], [place], [address] ?Phone: [phone]   Fax:  [phone] ? ?Speech Language Pathology Evaluation ? ?Patient Details  ?Name: Edward Garner ?MRN: [id number] ?Date of Birth: Jul 20, 1965 ?Referring Provider (SLP): Donald Prose, MD ? ? ?Encounter Date: 04/03/2021 ? ? End of Session - 04/04/21 1152   ? ? Visit Number 1   ? Number of Visits 17   ? Date for SLP Re-Evaluation 06/15/21   ? SLP Start Time [id number]   ? SLP Stop Time  1615   ? SLP Time Calculation (min) 40 min   ? Activity Tolerance Patient tolerated treatment well   ? ?  ?  ? ?  ? ? ?Past Medical History:  ?Diagnosis Date  ? 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[place])   ? Bipolar disorder ([place])   ? Complication of anesthesia   ? Diabetes mellitus without complication ([place])   ? Hyperlipidemia   ? Hypertension   ? Kidney disease   ? PONV (postoperative nausea and vomiting)   ? Sleep apnea   ? Sleep apnea, unspecified 10/19/2018  ? Spinal stenosis   ? ? ?Past Surgical History:  ?Procedure Laterality Date  ? ANTERIOR CERVICAL DECOMP/DISCECTOMY FUSION N/A 10/16/2020  ? Procedure: ACDF - C3-C4 - C4-C5 - C5-C6;  Surgeon: Eustace Moore, MD;  Location: [facility];  Service: Neurosurgery;  Laterality: N/A;  ? IR GASTROSTOMY TUBE MOD SED  11/08/2020  ? IR GASTROSTOMY TUBE REMOVAL  01/29/2021  ? TONSILLECTOMY    ? ? ?There were no vitals filed for this visit. ? ? Subjective Assessment - 04/04/21 0806   ? ? Subjective Pt arrives with his mother today for ST eval.   ? Patient is accompained by: Family member   mother  ? Currently in Pain? No/denies   ? ?  ?  ? ?  ? ? ? ? ? SLP Evaluation [facility] - 04/04/21 [id number]   ? ?  ? SLP Visit Information  ? SLP Received On 04/03/21   ? Referring Provider (SLP) Donald Prose, MD   ? Onset Date October 2022   ? Medical Diagnosis Dysphagia   ?  ? Subjective  ? Patient/Family Stated Goal Improve swallowing function   ?  ? General Information  ? HPI Pt underwent cervical ACDF C3-C6 on  10/16/20 and discharged home on 10/4. Pt subsequently returned to the hospital in October secondary to decreased oral intake due to ongoing pain and some difficulty swallowing. CT cervical showed slightly increased prevertebral swelling compared to 10/4 and narrowed AP diameter of the larynx the level of the epiglottis to 5 mm. G-tube placed 11/08/20 and removed 01/29/21. Pt has had multiple swallow studies on 10/26/20, 11/02/20, 11/16/20, and 02/02/21 with progresive improvement in swallow function. Dys III, thin, and meds crushed with puree was recommended on 02/02/21. Silent aspiration was seen during that study. Pt received dyspahgia treatment at [facility] inpatient rehab and was discharged 11/18/20 with recommendation of a clear liquid diet with observance of swallowing precautions. Pt received home health dysphagia treatment from November until two weeks ago, with focus on following swallow precautions and dysphagia exercises. Pt's mother reported occasional coughing with p.o. intake; pt denied notable coughing with POs. Pt's mother fills pt's medication box and has done so for many years.   ?  ? Balance Screen  ? Has the patient fallen in the past 6 months No   ?  ? Prior Functional Status  ? Cognitive/Linguistic Baseline  Within functional limits   ? Baseline deficit details --   ? Type of Home House   ?  Lives With Alone   ? Available Support Family   ? Education MS- Psychology   ?  ? Cognition  ? Overall Cognitive Status Within Functional Limits for tasks assessed   mother keeps pt's meds and fills med box (?). Pt denies cognitive communication changes at any point after ACDF sx  ? Attention --   dx ADHD  ? Behaviors Impulsive   (mildly - could be pt's personality)  ?  ? Auditory Comprehension  ? Overall Auditory Comprehension Appears within functional limits for tasks assessed   ?  ? Verbal Expression  ? Overall Verbal Expression Appears within functional limits for tasks assessed   ?  ? Oral Motor/Sensory Function   ? Overall Oral Motor/Sensory Function --   ?  ? Motor Speech  ? Overall Motor Speech Appears within functional limits for tasks assessed   ? ?  ?  ? ?  ? ? ? ? ? ? ? ? ? ? ? ? ? ? ? ? ? ? SLP Education - 04/04/21 1151   ? ? Education Details Swallow precautions   ? Person(s) Educated Patient   ? Methods Explanation;Verbal cues   ? Comprehension Verbalized understanding;Verbal cues required;Need further instruction   ? ?  ?  ? ?  ? ? ? SLP Short Term Goals - 04/04/21 1159   ? ?  ? SLP SHORT TERM GOAL #1  ? Title pt wil complete HEP for swallowing with SBA in 3 sessions   ? Time 4   ? Period Weeks   ? Status New   ? Target Date 05/04/21   ?  ? SLP SHORT TERM GOAL #2  ? Title pt will follow swallow precautions with POs 95% of the time with rare min A in 3 sessions   ? Time 4   ? Period Weeks   ? Status New   ? Target Date 05/04/21   ?  ? SLP SHORT TERM GOAL #3  ? Title pt will tell SLP 3 overt s/sx aspiration PNA with modified independence   ? Time 4   ? Period Weeks   ? Status New   ? Target Date 05/04/21   ? ?  ?  ? ?  ? ? ? SLP Long Term Goals - 04/04/21 1202   ? ?  ? SLP LONG TERM GOAL #1  ? Title pt will complete dysphagia HEP with modified independence in 4 sessions   ? Time 10   ? Period Weeks   ? Status New   ? Target Date 06/15/21   ?  ? SLP LONG TERM GOAL #2  ? Title pt will follow swallow precautions with POs 95% of the time with rare min A in 3 sessions   ? Time 10   ? Period Weeks   ? Status New   ? Target Date 06/15/21   ?  ? SLP LONG TERM GOAL #3  ? Title pt will undergo follow up modified (MBSS) if clinically indicated   ? Time 10   ? Period Weeks   ? Status New   ? Target Date 06/15/21   ? ?  ?  ? ?  ? ? ? Plan - 04/04/21 1153   ? ? Clinical Impression Statement Today pt presents with, as stated on modified (MBSS) dated 02-02-21, pharyngeal dysphagia which is improved from previous MBSS 11/16/20.  Precautions suggested at that time were slow rate, small bites/sips, alternate bite/sip, hard cough after  swallow, multiple dry swallows after sip or bite, and left head turn. See "HPI" for details about findings from that evaluation. Today, with cereal bar and water pt followed precautions of small bites, alternating bite/sip (65%), multiple dry swallows after each bite/sip, and left head turn (95%). He did not follow cough after swallow, small sips, and alternate bite/sip. Unsure at this time why mother keeps pt's meds and fills his med box for him. Pt demonstrated a degree of impulsivity today however this could be his personality. SLP believes pt will benefit from skilled ST targeting safe swallowing, and completion of swallow exericses to maximize pt's swallow strength for adequate nutritional intake. Pt may require follow up modifed during this therapy course and MD signature on this plan of care will indicate authorization by referring MD for a follow up MBSS when/if clinically indicated.   ? Speech Therapy Frequency 2x / week   ? Duration --   for 6 weeks, then once a week for 4 weeks  ? Treatment/Interventions Aspiration precaution training;Pharyngeal strengthening exercises;Diet toleration management by SLP;Trials of upgraded texture/liquids;Patient/family education;Internal/external aids;Compensatory strategies;SLP instruction and feedback   ? Potential to Achieve Goals Good   ? SLP Home Exercise Plan provided by [facility]   ? Consulted and Agree with Plan of Care Patient   ? ?  ?  ? ?  ? ? ?Patient will benefit from skilled therapeutic intervention in order to improve the following deficits and impairments:   ?Dysphagia, pharyngeal phase ? ?Cognitive communication deficit ? ? ? ?Problem List ?Patient Active Problem List  ? Diagnosis Date Noted  ? Other bipolar disorder ([place]) 11/11/2020  ? Generalized anxiety disorder 11/11/2020  ? Mild bipolar I disorder, most recent episode depressed ([place]) 11/11/2020  ? Debility 11/10/2020  ? Aspiration pneumonia ([place])   ? Encounter for feeding tube placement   ? Dysphagia   ?  Delirium due to another medical condition   ? Polypharmacy   ? Malnutrition of moderate degree 10/21/2020  ? Hypoxemia 10/19/2020  ? Acute respiratory failure with hypoxia ([place]) 10/19/2020  ? S/P cervical spi

## 2021-04-09 ENCOUNTER — Ambulatory Visit: Payer: Medicare Other

## 2021-04-12 ENCOUNTER — Ambulatory Visit: Payer: Medicare Other

## 2021-04-12 DIAGNOSIS — R1313 Dysphagia, pharyngeal phase: Secondary | ICD-10-CM

## 2021-04-12 DIAGNOSIS — R41841 Cognitive communication deficit: Secondary | ICD-10-CM

## 2021-04-12 NOTE — Patient Instructions (Signed)
? ?  WHEN EATING: ?================== ?Turn head left  ? ?Alternate solids with liquid ? ?Hard cough after swallows ? ?Swallow 2-3 times after each bite/sip ?

## 2021-04-12 NOTE — Therapy (Addendum)
[facility] ?[facility] ?[address], [address] ?[place], [place], [address] ?Phone: [phone]   Fax:  [phone] ? ?Speech Language Pathology Treatment ? ?Patient Details  ?Name: Edward Garner ?MRN: [id number] ?Date of Birth: 10/10/1965 ?Referring Provider (SLP): [name], MD ? ? ?Encounter Date: 04/12/2021 ? ? End of Session - 04/12/21 1214   ? ? Visit Number 2   ? Number of Visits 17   ? Date for SLP Re-Evaluation 06/15/21   ? SLP Start Time 1104   ? SLP Stop Time  1145   ? SLP Time Calculation (min) 41 min   ? Activity Tolerance Patient tolerated treatment well   ? ?  ?  ? ?  ? ? ?Past Medical History:  ?Diagnosis Date  ? 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[facility])   ? Bipolar disorder ([facility])   ? Complication of anesthesia   ? Diabetes mellitus without complication ([facility])   ? Hyperlipidemia   ? Hypertension   ? Kidney disease   ? PONV (postoperative nausea and vomiting)   ? Sleep apnea   ? Sleep apnea, unspecified 10/19/2018  ? Spinal stenosis   ? ? ?Past Surgical History:  ?Procedure Laterality Date  ? ANTERIOR CERVICAL DECOMP/DISCECTOMY FUSION N/A 10/16/2020  ? Procedure: ACDF - C3-C4 - C4-C5 - C5-C6;  Surgeon: Tia Alert, MD;  Location: [facility] OR;  Service: Neurosurgery;  Laterality: N/A;  ? IR GASTROSTOMY TUBE MOD SED  11/08/2020  ? IR GASTROSTOMY TUBE REMOVAL  01/29/2021  ? TONSILLECTOMY    ? ? ?There were no vitals filed for this visit. ? ? ?ADULT SLP TREATMENT - 04/30/21 0001   ? ?  ? General Information  ? Behavior/Cognition Alert;Cooperative;Pleasant mood   ?  ? Treatment Provided  ? Treatment provided Dysphagia   ?  ? Dysphagia Treatment  ? Temperature Spikes Noted No   ? Respiratory Status Room air   ? Oral Phase Signs & Symptoms Other (comment)   none noted today  ? Pharyngeal Phase Signs & Symptoms Other (comment)   none noted today  ? Other treatment/comments Pt oculd not find exercises HHST designed for him "but I didn't look everywhere I could have looked." With this response, SLP  wonders how diligent pt was with completing HEP. SLP encouraged pt to look again with more thorough search and bring to next session. SLP guided pt with POs and aspiration precautions - he req'd less frequent cueing than during evaluation - min A rarely.   ?  ? Assessment / Recommendations / Plan  ? Plan Continue with current plan of care   if pt cannot find HEP SLP will have to develop HEP based on pt's last MBSS next session  ?  ? Dysphagia Recommendations  ? Diet recommendations --   as deliniated on MBSS 02-02-21  ? Medication Administration --   as deliniated on MBSS 02-02-21  ? Compensations Slow rate;Small sips/bites;Follow solids with liquid;Hard cough after swallow;Multiple dry swallows after each bite/sip   ? Postural Changes and/or Swallow Maneuvers Head turn left during swallow;Seated upright 90 degrees;Upright 30-60 min after meal   ?  ? Progression Toward Goals  ? Progression toward goals Progressing toward goals   ? ? ? ? ? ? ? ? ? SLP Education - 04/12/21 1213   ? ? Education Details do exercises TID 10 reps each, swallow precautions, schedule MBSS for after 6-8 consecutive weeks of HEP completion   ? Person(s) Educated Patient   ? Methods Explanation;Verbal  cues   ? Comprehension Verbalized understanding;Returned demonstration;Verbal cues required;Need further instruction   ? ?  ?  ? ?  ? ? ? SLP Short Term Goals - 04/12/21 1233   ? ?  ? SLP SHORT TERM GOAL #1  ? Title pt wil complete HEP for swallowing with SBA in 3 sessions   ? Time 4   ? Period Weeks   ? Status On-going   ? Target Date 05/04/21   ?  ? SLP SHORT TERM GOAL #2  ? Title pt will follow swallow precautions with POs 95% of the time with rare min A in 3 sessions   ? Time 4   ? Period Weeks   ? Status On-going   ? Target Date 05/04/21   ?  ? SLP SHORT TERM GOAL #3  ? Title pt will tell SLP 3 overt s/sx aspiration PNA with modified independence   ? Time 4   ? Period Weeks   ? Status On-going   ? Target Date 05/04/21   ? ?  ?  ? ?  ? ? ? SLP  Long Term Goals - 04/12/21 1233   ? ?  ? SLP LONG TERM GOAL #1  ? Title pt will complete dysphagia HEP with modified independence in 4 sessions   ? Time 10   ? Period Weeks   ? Status New   ? Target Date 06/15/21   ?  ? SLP LONG TERM GOAL #2  ? Title pt will follow swallow precautions with POs 95% of the time with rare min A in 3 sessions   ? Time 10   ? Period Weeks   ? Status On-going   ? Target Date 06/15/21   ?  ? SLP LONG TERM GOAL #3  ? Title pt will undergo follow up modified (MBSS) if clinically indicated   ? Time 10   ? Period Weeks   ? Status On-going   ? Target Date 06/15/21   ? ?  ?  ? ?  ? ? ? Plan - 04/12/21 1214   ? ? Clinical Impression Statement Today pt presents with, as stated on modified (MBSS) dated 02-02-21, pharyngeal dysphagia which is improved from previous MBSS 11/16/20. Precautions suggested at that time were slow rate, small bites/sips, alternate bite/sip, hard cough after swallow, multiple dry swallows after sip or bite, and left head turn. Today, with cereal bar and water pt followed precautions of small bites, alternating bite/sip (95%), multiple dry swallows after each bite/sip (85%), and left head turn (98%), cough after swallow (95%), small sips, and alternate bite/sip (90%). Pt demonstrated some difficulty with being distracted and following precautions. I strongly encouraged pt to leave TV off and conversation cease when pt is eating.  SLP believes pt will benefit from skilled ST targeting safe swallowing, and completion of swallow exericses to maximize pt's swallow strength for adequate nutritional intake. Pt may require follow up modifed during this therapy course and MD signature on this plan of care will indicate authorization by referring MD for a follow up MBSS when/if clinically indicated.   ? Speech Therapy Frequency 2x / week   ? Duration --   for 6 weeks, then once a week for 4 weeks  ? Treatment/Interventions Aspiration precaution training;Pharyngeal strengthening  exercises;Diet toleration management by SLP;Trials of upgraded texture/liquids;Patient/family education;Internal/external aids;Compensatory strategies;SLP instruction and feedback   ? Potential to Achieve Goals Good   ? SLP Home Exercise Plan provided by [facility]   ? Consulted  and Agree with Plan of Care Patient   ? ?  ?  ? ?  ? ? ?Patient will benefit from skilled therapeutic intervention in order to improve the following deficits and impairments:   ?Dysphagia, pharyngeal phase ? ?Cognitive communication deficit ? ? ? ?Problem List ?Patient Active Problem List  ? Diagnosis Date Noted  ? Other bipolar disorder ([facility]) 11/11/2020  ? Generalized anxiety disorder 11/11/2020  ? Mild bipolar I disorder, most recent episode depressed ([facility]) 11/11/2020  ? Debility 11/10/2020  ? Aspiration pneumonia ([facility])   ? Encounter for feeding tube placement   ? Dysphagia   ? Delirium due to another medical condition   ? Polypharmacy   ? Malnutrition of moderate degree 10/21/2020  ? Hypoxemia 10/19/2020  ? Acute respiratory failure with hypoxia ([facility]) 10/19/2020  ? S/P cervical spinal fusion 10/16/2020  ? Uncontrolled type 2 diabetes mellitus with hyperglycemia ([facility]) 03/01/2019  ? Essential hypertension, benign 03/01/2019  ? Mixed hyperlipidemia 03/01/2019  ? Sleep apnea, unspecified 10/19/2018  ? Seizure disorder ([facility]) 09/28/2018  ? ? ?Jayvin Hurrell, CCC-SLP ?04/12/2021, 12:34 PM ? ?[place] ?[facility] ?[address], [address] ?[place], [place], [address] ?Phone: [phone]   Fax:  [phone] ? ? ?Name: Edward Garner ?MRN: [id number] ?Date of Birth: 1965/04/07 ? ?

## 2021-05-01 ENCOUNTER — Ambulatory Visit: Payer: Medicare Other | Attending: Family Medicine

## 2021-05-04 ENCOUNTER — Ambulatory Visit: Payer: Medicare Other

## 2021-05-08 ENCOUNTER — Ambulatory Visit: Payer: Medicare Other

## 2021-05-10 ENCOUNTER — Ambulatory Visit: Payer: Medicare Other

## 2021-07-05 ENCOUNTER — Other Ambulatory Visit (HOSPITAL_COMMUNITY): Payer: Self-pay

## 2021-07-18 ENCOUNTER — Other Ambulatory Visit (HOSPITAL_COMMUNITY): Payer: Self-pay

## 2022-01-15 NOTE — Therapy (Signed)
[facility] [address], [place], [place], [address] Phone: [phone]   Fax:  [phone]  Patient Details  Name: Edward Garner MRN: [id number] Date of Birth: [birth year] Referring Provider:  Donald Prose, MD  Encounter Date: 01/15/2022  SPEECH THERAPY DISCHARGE SUMMARY  Visits from Start of Care: 2  Current functional level related to goals / functional outcomes: Pt was suggested to schedule ST for x2/week x8 weeks but unfortunately did not adhere to clinic's attendance policy after no-shows to scheduled therapy sessions. He was discharged in late April 2023. Goals and plan from his last attended session are below:  SLP Short Term Goals - 04/12/21 1233                SLP SHORT TERM GOAL #1    Title pt wil complete HEP for swallowing with SBA in 3 sessions     Time 4     Period Weeks     Status On-going     Target Date 05/04/21          SLP SHORT TERM GOAL #2    Title pt will follow swallow precautions with POs 95% of the time with rare min A in 3 sessions     Time 4     Period Weeks     Status On-going     Target Date 05/04/21          SLP SHORT TERM GOAL #3    Title pt will tell SLP 3 overt s/sx aspiration PNA with modified independence     Time 4     Period Weeks     Status On-going     Target Date 05/04/21                     SLP Long Term Goals - 04/12/21 1233                SLP LONG TERM GOAL #1    Title pt will complete dysphagia HEP with modified independence in 4 sessions     Time 10     Period Weeks     Status New     Target Date 06/15/21          SLP LONG TERM GOAL #2    Title pt will follow swallow precautions with POs 95% of the time with rare min A in 3 sessions     Time 10     Period Weeks     Status On-going     Target Date 06/15/21          SLP LONG TERM GOAL #3    Title pt will undergo follow up modified (MBSS) if clinically indicated     Time 10     Period Weeks     Status  On-going     Target Date 06/15/21                     Plan - 04/12/21 1214       Clinical Impression Statement Today pt presents with, as stated on modified (MBSS) dated 02-02-21, pharyngeal dysphagia which is improved from previous MBSS 11/16/20. Precautions suggested at that time were slow rate, small bites/sips, alternate bite/sip, hard cough after swallow, multiple dry swallows after sip or bite, and left head turn. Today, with cereal bar and water pt followed precautions of small bites, alternating bite/sip (95%), multiple dry swallows after each bite/sip (85%), and  left head turn (98%), cough after swallow (95%), small sips, and alternate bite/sip (90%). Pt demonstrated some difficulty with being distracted and following precautions. I strongly encouraged pt to leave TV off and conversation cease when pt is eating.  SLP believes pt will benefit from skilled ST targeting safe swallowing, and completion of swallow exericses to maximize pt's swallow strength for adequate nutritional intake. Pt may require follow up modifed during this therapy course and MD signature on this plan of care will indicate authorization by referring MD for a follow up MBSS when/if clinically indicated.        Remaining deficits: Assumed deficits remain.   Education / Equipment: See therapy notes for more detail.   Patient agrees to discharge. Patient goals were not met. Patient is being discharged due to not returning since the last visit.[name]     [place], [place] 01/15/2022, 1:54 PM  [facility] [address], [place], [place], [address] Phone: [phone]   Fax:  [phone]

## 2023-08-20 ENCOUNTER — Ambulatory Visit: Admitting: Podiatry

## 2023-08-20 ENCOUNTER — Encounter: Payer: Self-pay | Admitting: Podiatry

## 2023-08-20 DIAGNOSIS — E1149 Type 2 diabetes mellitus with other diabetic neurological complication: Secondary | ICD-10-CM | POA: Diagnosis not present

## 2023-08-20 DIAGNOSIS — E1140 Type 2 diabetes mellitus with diabetic neuropathy, unspecified: Secondary | ICD-10-CM

## 2023-08-20 DIAGNOSIS — M79675 Pain in left toe(s): Secondary | ICD-10-CM | POA: Diagnosis not present

## 2023-08-20 DIAGNOSIS — M79674 Pain in right toe(s): Secondary | ICD-10-CM | POA: Diagnosis not present

## 2023-08-20 DIAGNOSIS — B351 Tinea unguium: Secondary | ICD-10-CM

## 2023-08-22 NOTE — Progress Notes (Signed)
 Subjective:   Patient ID: Edward Garner, male   DOB: 58 y.o.   MRN: [id number]   HPI Patient presents with elongated nailbeds 1-5 both feet that he cannot take care of and patient is a diabetic and has moderate neuropathic disease.  Patient does not currently smoke and has caregiver and is not active   Review of Systems  All other systems reviewed and are negative.       Objective:  Physical Exam Vitals and nursing note reviewed.  Constitutional:      Appearance: He is well-developed.  Pulmonary:     Effort: Pulmonary effort is normal.  Musculoskeletal:        General: Normal range of motion.  Skin:    General: Skin is warm.  Neurological:     Mental Status: He is alert.     Vascular status found to be intact with moderate diminishment sharp dull vibratory bilateral.  Patient is noted to have thick nailbeds 1-5 both feet dystrophic and they can become painful are impossible for him for caregivers to cut.  Good digital perfusion     Assessment:  Mycotic nail infection 1-5 both feet patient has diabetes under reasonable control but does have moderate neuropathic disease     Plan:  HNP reviewed with him and caregiver daily inspections of feet and diabetes and debrided nailbeds 1-5 both feet no iatrogenic bleeding reappoint routine care
